# Patient Record
Sex: Female | Born: 1935 | Race: White | Hispanic: No | Smoking: Former smoker
Health system: Southern US, Community
[De-identification: ages and names within clinical notes are randomized; demographics above are authoritative.]

## PROBLEM LIST (undated history)

## (undated) DIAGNOSIS — H469 Unspecified optic neuritis: Secondary | ICD-10-CM

## (undated) DIAGNOSIS — R0602 Shortness of breath: Secondary | ICD-10-CM

## (undated) DIAGNOSIS — D473 Essential (hemorrhagic) thrombocythemia: Secondary | ICD-10-CM

## (undated) DIAGNOSIS — I4891 Unspecified atrial fibrillation: Secondary | ICD-10-CM

## (undated) DIAGNOSIS — K635 Polyp of colon: Secondary | ICD-10-CM

## (undated) DIAGNOSIS — E669 Obesity, unspecified: Secondary | ICD-10-CM

## (undated) DIAGNOSIS — S42302A Unspecified fracture of shaft of humerus, left arm, initial encounter for closed fracture: Secondary | ICD-10-CM

## (undated) DIAGNOSIS — N8501 Benign endometrial hyperplasia: Secondary | ICD-10-CM

## (undated) DIAGNOSIS — I10 Essential (primary) hypertension: Secondary | ICD-10-CM

## (undated) DIAGNOSIS — K5790 Diverticulosis of intestine, part unspecified, without perforation or abscess without bleeding: Secondary | ICD-10-CM

## (undated) DIAGNOSIS — K760 Fatty (change of) liver, not elsewhere classified: Secondary | ICD-10-CM

## (undated) DIAGNOSIS — D75839 Thrombocytosis, unspecified: Secondary | ICD-10-CM

## (undated) DIAGNOSIS — H269 Unspecified cataract: Secondary | ICD-10-CM

## (undated) DIAGNOSIS — K449 Diaphragmatic hernia without obstruction or gangrene: Secondary | ICD-10-CM

## (undated) DIAGNOSIS — N840 Polyp of corpus uteri: Secondary | ICD-10-CM

## (undated) DIAGNOSIS — K219 Gastro-esophageal reflux disease without esophagitis: Secondary | ICD-10-CM

## (undated) HISTORY — PX: OTHER SURGICAL HISTORY: SHX169

## (undated) HISTORY — DX: Gastro-esophageal reflux disease without esophagitis: K21.9

## (undated) HISTORY — DX: Diaphragmatic hernia without obstruction or gangrene: K44.9

## (undated) HISTORY — DX: Unspecified atrial fibrillation: I48.91

## (undated) HISTORY — DX: Benign endometrial hyperplasia: N85.01

## (undated) HISTORY — DX: Fatty (change of) liver, not elsewhere classified: K76.0

## (undated) HISTORY — DX: Diverticulosis of intestine, part unspecified, without perforation or abscess without bleeding: K57.90

## (undated) HISTORY — PX: CATARACT EXTRACTION: SUR2

## (undated) HISTORY — DX: Polyp of colon: K63.5

## (undated) HISTORY — DX: Polyp of corpus uteri: N84.0

## (undated) HISTORY — DX: Obesity, unspecified: E66.9

## (undated) HISTORY — PX: DILATION AND CURETTAGE OF UTERUS: SHX78

## (undated) HISTORY — PX: KNEE SURGERY: SHX244

## (undated) HISTORY — DX: Unspecified optic neuritis: H46.9

## (undated) HISTORY — DX: Shortness of breath: R06.02

## (undated) HISTORY — PX: EYE SURGERY: SHX253

## (undated) HISTORY — DX: Essential (primary) hypertension: I10

## (undated) HISTORY — DX: Unspecified fracture of shaft of humerus, left arm, initial encounter for closed fracture: S42.302A

## (undated) HISTORY — DX: Essential (hemorrhagic) thrombocythemia: D47.3

## (undated) HISTORY — DX: Thrombocytosis, unspecified: D75.839

## (undated) HISTORY — DX: Unspecified cataract: H26.9

## (undated) HISTORY — PX: HERNIA REPAIR: SHX51

---

## 1998-12-11 ENCOUNTER — Other Ambulatory Visit: Admission: RE | Admit: 1998-12-11 | Discharge: 1998-12-11 | Payer: Self-pay | Admitting: Obstetrics and Gynecology

## 2000-03-09 ENCOUNTER — Encounter: Admission: RE | Admit: 2000-03-09 | Discharge: 2000-03-09 | Payer: Self-pay | Admitting: General Surgery

## 2000-03-09 ENCOUNTER — Encounter: Payer: Self-pay | Admitting: Family Medicine

## 2000-07-28 ENCOUNTER — Ambulatory Visit (HOSPITAL_COMMUNITY): Admission: RE | Admit: 2000-07-28 | Discharge: 2000-07-28 | Payer: Self-pay | Admitting: Gastroenterology

## 2000-07-28 ENCOUNTER — Encounter: Payer: Self-pay | Admitting: Gastroenterology

## 2000-07-28 ENCOUNTER — Encounter (INDEPENDENT_AMBULATORY_CARE_PROVIDER_SITE_OTHER): Payer: Self-pay | Admitting: Specialist

## 2000-11-12 ENCOUNTER — Other Ambulatory Visit: Admission: RE | Admit: 2000-11-12 | Discharge: 2000-11-12 | Payer: Self-pay | Admitting: Obstetrics and Gynecology

## 2001-03-29 ENCOUNTER — Encounter: Admission: RE | Admit: 2001-03-29 | Discharge: 2001-03-29 | Payer: Self-pay | Admitting: Family Medicine

## 2001-03-29 ENCOUNTER — Encounter: Payer: Self-pay | Admitting: Family Medicine

## 2001-03-30 ENCOUNTER — Encounter (INDEPENDENT_AMBULATORY_CARE_PROVIDER_SITE_OTHER): Payer: Self-pay | Admitting: Specialist

## 2001-03-30 ENCOUNTER — Other Ambulatory Visit: Admission: RE | Admit: 2001-03-30 | Discharge: 2001-03-30 | Payer: Self-pay | Admitting: General Surgery

## 2001-03-30 ENCOUNTER — Encounter: Admission: RE | Admit: 2001-03-30 | Discharge: 2001-03-30 | Payer: Self-pay | Admitting: Family Medicine

## 2001-03-30 ENCOUNTER — Encounter: Payer: Self-pay | Admitting: Family Medicine

## 2001-11-18 ENCOUNTER — Other Ambulatory Visit: Admission: RE | Admit: 2001-11-18 | Discharge: 2001-11-18 | Payer: Self-pay | Admitting: Obstetrics and Gynecology

## 2002-02-22 ENCOUNTER — Encounter (INDEPENDENT_AMBULATORY_CARE_PROVIDER_SITE_OTHER): Payer: Self-pay | Admitting: *Deleted

## 2002-02-22 ENCOUNTER — Encounter: Admission: RE | Admit: 2002-02-22 | Discharge: 2002-02-22 | Payer: Self-pay | Admitting: Surgery

## 2002-02-22 ENCOUNTER — Encounter: Payer: Self-pay | Admitting: Surgery

## 2002-03-07 HISTORY — PX: CHOLECYSTECTOMY: SHX55

## 2002-03-20 ENCOUNTER — Encounter: Payer: Self-pay | Admitting: Surgery

## 2002-03-27 ENCOUNTER — Encounter: Payer: Self-pay | Admitting: Surgery

## 2002-03-27 ENCOUNTER — Observation Stay (HOSPITAL_COMMUNITY): Admission: RE | Admit: 2002-03-27 | Discharge: 2002-03-28 | Payer: Self-pay | Admitting: Surgery

## 2002-03-27 ENCOUNTER — Encounter (INDEPENDENT_AMBULATORY_CARE_PROVIDER_SITE_OTHER): Payer: Self-pay | Admitting: *Deleted

## 2002-03-27 ENCOUNTER — Encounter (INDEPENDENT_AMBULATORY_CARE_PROVIDER_SITE_OTHER): Payer: Self-pay

## 2002-04-20 ENCOUNTER — Encounter: Payer: Self-pay | Admitting: General Surgery

## 2002-04-20 ENCOUNTER — Encounter: Admission: RE | Admit: 2002-04-20 | Discharge: 2002-04-20 | Payer: Self-pay | Admitting: General Surgery

## 2002-09-06 HISTORY — PX: OTHER SURGICAL HISTORY: SHX169

## 2002-10-05 ENCOUNTER — Encounter (INDEPENDENT_AMBULATORY_CARE_PROVIDER_SITE_OTHER): Payer: Self-pay | Admitting: *Deleted

## 2002-10-05 ENCOUNTER — Observation Stay (HOSPITAL_COMMUNITY): Admission: RE | Admit: 2002-10-05 | Discharge: 2002-10-06 | Payer: Self-pay | Admitting: Surgery

## 2002-12-13 ENCOUNTER — Encounter (INDEPENDENT_AMBULATORY_CARE_PROVIDER_SITE_OTHER): Payer: Self-pay | Admitting: *Deleted

## 2002-12-13 ENCOUNTER — Encounter: Payer: Self-pay | Admitting: Family Medicine

## 2002-12-13 ENCOUNTER — Ambulatory Visit (HOSPITAL_COMMUNITY): Admission: RE | Admit: 2002-12-13 | Discharge: 2002-12-13 | Payer: Self-pay | Admitting: Family Medicine

## 2002-12-26 ENCOUNTER — Other Ambulatory Visit: Admission: RE | Admit: 2002-12-26 | Discharge: 2002-12-26 | Payer: Self-pay | Admitting: Gynecology

## 2003-04-27 ENCOUNTER — Encounter: Admission: RE | Admit: 2003-04-27 | Discharge: 2003-04-27 | Payer: Self-pay | Admitting: Gynecology

## 2003-04-27 ENCOUNTER — Encounter: Payer: Self-pay | Admitting: Gynecology

## 2004-02-19 ENCOUNTER — Other Ambulatory Visit: Admission: RE | Admit: 2004-02-19 | Discharge: 2004-02-19 | Payer: Self-pay | Admitting: Gynecology

## 2004-05-13 ENCOUNTER — Encounter: Admission: RE | Admit: 2004-05-13 | Discharge: 2004-05-13 | Payer: Self-pay | Admitting: Gynecology

## 2004-07-22 ENCOUNTER — Ambulatory Visit (HOSPITAL_COMMUNITY): Admission: RE | Admit: 2004-07-22 | Discharge: 2004-07-22 | Payer: Self-pay | Admitting: Gynecology

## 2004-07-22 ENCOUNTER — Ambulatory Visit (HOSPITAL_BASED_OUTPATIENT_CLINIC_OR_DEPARTMENT_OTHER): Admission: RE | Admit: 2004-07-22 | Discharge: 2004-07-22 | Payer: Self-pay | Admitting: Gynecology

## 2004-07-22 ENCOUNTER — Encounter (INDEPENDENT_AMBULATORY_CARE_PROVIDER_SITE_OTHER): Payer: Self-pay | Admitting: Specialist

## 2005-05-26 ENCOUNTER — Encounter: Admission: RE | Admit: 2005-05-26 | Discharge: 2005-05-26 | Payer: Self-pay | Admitting: Gynecology

## 2005-06-02 ENCOUNTER — Encounter: Admission: RE | Admit: 2005-06-02 | Discharge: 2005-06-02 | Payer: Self-pay | Admitting: Gynecology

## 2005-06-24 ENCOUNTER — Other Ambulatory Visit: Admission: RE | Admit: 2005-06-24 | Discharge: 2005-06-24 | Payer: Self-pay | Admitting: Gynecology

## 2006-06-29 ENCOUNTER — Encounter: Admission: RE | Admit: 2006-06-29 | Discharge: 2006-06-29 | Payer: Self-pay | Admitting: Family Medicine

## 2006-09-01 ENCOUNTER — Other Ambulatory Visit: Admission: RE | Admit: 2006-09-01 | Discharge: 2006-09-01 | Payer: Self-pay | Admitting: Gynecology

## 2007-01-19 ENCOUNTER — Ambulatory Visit: Payer: Self-pay | Admitting: Cardiology

## 2007-07-26 ENCOUNTER — Encounter: Admission: RE | Admit: 2007-07-26 | Discharge: 2007-07-26 | Payer: Self-pay | Admitting: Gynecology

## 2007-08-02 ENCOUNTER — Ambulatory Visit: Payer: Self-pay

## 2007-08-02 ENCOUNTER — Encounter: Payer: Self-pay | Admitting: Family Medicine

## 2007-09-21 ENCOUNTER — Ambulatory Visit: Payer: Self-pay | Admitting: Cardiology

## 2007-10-13 ENCOUNTER — Other Ambulatory Visit: Admission: RE | Admit: 2007-10-13 | Discharge: 2007-10-13 | Payer: Self-pay | Admitting: Gynecology

## 2007-10-18 ENCOUNTER — Encounter: Admission: RE | Admit: 2007-10-18 | Discharge: 2007-10-18 | Payer: Self-pay | Admitting: Gynecology

## 2007-11-08 ENCOUNTER — Ambulatory Visit: Payer: Self-pay | Admitting: Gastroenterology

## 2007-11-09 ENCOUNTER — Ambulatory Visit: Payer: Self-pay | Admitting: Gastroenterology

## 2007-11-09 ENCOUNTER — Encounter: Payer: Self-pay | Admitting: Gastroenterology

## 2007-12-07 DIAGNOSIS — R0602 Shortness of breath: Secondary | ICD-10-CM

## 2007-12-07 DIAGNOSIS — N8501 Benign endometrial hyperplasia: Secondary | ICD-10-CM | POA: Insufficient documentation

## 2007-12-07 DIAGNOSIS — K449 Diaphragmatic hernia without obstruction or gangrene: Secondary | ICD-10-CM | POA: Insufficient documentation

## 2007-12-07 DIAGNOSIS — D179 Benign lipomatous neoplasm, unspecified: Secondary | ICD-10-CM | POA: Insufficient documentation

## 2007-12-07 DIAGNOSIS — D126 Benign neoplasm of colon, unspecified: Secondary | ICD-10-CM | POA: Insufficient documentation

## 2007-12-07 DIAGNOSIS — E669 Obesity, unspecified: Secondary | ICD-10-CM | POA: Insufficient documentation

## 2007-12-07 DIAGNOSIS — K5730 Diverticulosis of large intestine without perforation or abscess without bleeding: Secondary | ICD-10-CM | POA: Insufficient documentation

## 2007-12-07 DIAGNOSIS — K7689 Other specified diseases of liver: Secondary | ICD-10-CM

## 2007-12-07 DIAGNOSIS — K219 Gastro-esophageal reflux disease without esophagitis: Secondary | ICD-10-CM

## 2007-12-07 DIAGNOSIS — N840 Polyp of corpus uteri: Secondary | ICD-10-CM | POA: Insufficient documentation

## 2007-12-07 DIAGNOSIS — I10 Essential (primary) hypertension: Secondary | ICD-10-CM

## 2008-10-23 ENCOUNTER — Encounter: Admission: RE | Admit: 2008-10-23 | Discharge: 2008-10-23 | Payer: Self-pay | Admitting: Gynecology

## 2009-12-24 ENCOUNTER — Encounter: Admission: RE | Admit: 2009-12-24 | Discharge: 2009-12-24 | Payer: Self-pay | Admitting: Gynecology

## 2010-02-10 ENCOUNTER — Encounter: Admission: RE | Admit: 2010-02-10 | Discharge: 2010-02-10 | Payer: Self-pay | Admitting: Family Medicine

## 2010-02-26 ENCOUNTER — Ambulatory Visit: Payer: Self-pay | Admitting: Cardiology

## 2010-02-26 DIAGNOSIS — R002 Palpitations: Secondary | ICD-10-CM | POA: Insufficient documentation

## 2010-03-19 ENCOUNTER — Ambulatory Visit: Payer: Self-pay | Admitting: Cardiovascular Disease

## 2010-03-19 ENCOUNTER — Ambulatory Visit (HOSPITAL_COMMUNITY): Admission: RE | Admit: 2010-03-19 | Discharge: 2010-03-19 | Payer: Self-pay | Admitting: Cardiology

## 2010-03-19 ENCOUNTER — Ambulatory Visit: Payer: Self-pay | Admitting: Cardiology

## 2010-03-19 ENCOUNTER — Encounter: Payer: Self-pay | Admitting: Cardiology

## 2010-03-20 LAB — CONVERTED CEMR LAB
CO2: 31 meq/L (ref 19–32)
Calcium: 9.4 mg/dL (ref 8.4–10.5)
Chloride: 105 meq/L (ref 96–112)
Creatinine, Ser: 0.9 mg/dL (ref 0.4–1.2)
GFR calc non Af Amer: 64.12 mL/min (ref >60)
Glucose, Bld: 143 mg/dL — ABNORMAL HIGH (ref 70–99)
Potassium: 3.7 meq/L (ref 3.5–5.1)
Pro B Natriuretic peptide (BNP): 145 pg/mL — ABNORMAL HIGH (ref 0.0–100.0)

## 2010-04-21 ENCOUNTER — Encounter: Payer: Self-pay | Admitting: Cardiology

## 2010-04-23 ENCOUNTER — Ambulatory Visit: Payer: Self-pay | Admitting: Cardiology

## 2010-10-15 ENCOUNTER — Ambulatory Visit: Payer: Self-pay | Admitting: Cardiology

## 2010-12-28 ENCOUNTER — Encounter: Payer: Self-pay | Admitting: Gastroenterology

## 2010-12-28 ENCOUNTER — Encounter: Payer: Self-pay | Admitting: Gynecology

## 2011-01-08 NOTE — Assessment & Plan Note (Signed)
Summary: [facility]   Visit Type:  Follow-up Primary Provider:  Dr. Christell Constant, MD  CC:  HTN and Dyspnea.  History of Present Illness: The patient presents for followup. At the last visit she was having some dyspnea and higher blood pressures so I decided 12.5 mg daily. Her blood pressures been in the 120s over 80s. Her dyspnea is slightly improved. I did get an echocardiogram which demonstrated well-preserved systolic function but evidence of diastolic dysfunction. BNP was very mildly elevated. She was a little lightheaded in starting the medicine but has tolerated it and now has no problems. She is not describing any chest pressure, neck or arm discomfort. She's not having any palpitations,  pre-syncope or syncope. She has no PND or orthopnea. I did check a basic metabolic profile and her potassium and creatinine were fine.  Current Medications (verified): 1)  Prometrium 200 Mg Caps (Progesterone Micronized) .Marland Kitchen.. 12 Q 4 Months 2)  Zoloft 25 Mg Tabs (Sertraline Hcl) .Marland Kitchen.. 1 By Mouth Daily 3)  Prilosec 20 Mg Cpdr (Omeprazole) .Marland Kitchen.. 1 By Mouth Daily 4)  Trimethoprim 100 Mg Tabs (Trimethoprim) .... As Needed 5)  Aspirin 325 Mg  Tabs (Aspirin) .Marland Kitchen.. 1 By Mouth Daily 6)  Vitamin D 1000 Unit  Tabs (Cholecalciferol) .Marland Kitchen.. 1 By Mouth Dfaily 7)  Calcium Carbonate   Powd (Calcium Carbonate) .Marland Kitchen.. 1 By Mouth Daily 8)  Multivitamins   Tabs (Multiple Vitamin) .Marland Kitchen.. 1 By Mouth Daily 9)  Hydrochlorothiazide 25 Mg Tabs (Hydrochlorothiazide) .... 1/2 Tablet Daily  Allergies (verified): No Known Drug Allergies  Past History:  Past Medical History: Reviewed history from 02/26/2010 and no changes required. 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 Hysterectomy Cholecystectomy 03-2002 Umbilical Hernia Repair 09-2002 Orthopedic Knee Surgery Optic neuritis  Review of Systems       As stated in the HPI and negative for all other systems.   Vital Signs:  Patient profile:   77 year old female Height:      62 inches Weight:      185 pounds BMI:     33.96 Pulse rate:   79 / minute Resp:     16 per minute BP sitting:   144 / 74  (right arm)  Vitals Entered By: Marrion Coy, CNA (Apr 23, 2010 10:23 AM)  Physical Exam  General:  Well developed, well nourished, in no acute distress. Head:  normocephalic and atraumatic Neck:  Neck supple, no JVD. No masses, thyromegaly or abnormal cervical nodes. Lungs:  Clear bilaterally to auscultation and percussion. Heart:  Non-displaced PMI, chest non-tender; regular rate and rhythm, S1, S2 without murmurs, rubs or gallops. Carotid upstroke normal, no bruit. Normal abdominal aortic size, no bruits. Femorals normal pulses, no bruits. Pedals normal pulses. No edema, no varicosities. Abdomen:  Bowel sounds positive; abdomen soft and non-tender without masses, organomegaly, or hernias noted. No hepatosplenomegaly. Msk:  Back normal, normal gait. Muscle strength and tone normal. Extremities:  No clubbing or cyanosis. Neurologic:  Alert and oriented x 3. Skin:  Intact without lesions or rashes. Psych:  Normal affect.   Impression & Recommendations:  Problem # 1:  HYPERTENSION (ICD-401.9) Ther blood pressure is slightly high today it  is well controlled otherwise and better than previous. She will continue on the current meds.  Problem # 2:  SOB (ICD-786.05) Her dyspnea is well controlled. She knows to avoid salt and excess  fluid.  Other Orders: EKG w/ Interpretation (93000)  Patient Instructions: 1)  Your physician recommends that you schedule a follow-up appointment in: 6 months with Dr Antoine Poche 2)  Your physician recommends that you continue on your current medications as directed. Please refer to the Current Medication list given to you today. Prescriptions: HYDROCHLOROTHIAZIDE 12.5 MG CAPS (HYDROCHLOROTHIAZIDE) one daily  #30 x 11   Entered by:   Charolotte Capuchin, RN   Authorized by:   Rollene Rotunda, MD, [facility]   Signed by:   Charolotte Capuchin, RN on 04/23/2010   Method used:   Electronically to        [facility] [phone]* (retail)       [address] [place], [place]  [address]       Ph: [phone] or [phone]       Fax: [phone]   RxID:   [id number]

## 2011-01-08 NOTE — Assessment & Plan Note (Signed)
Summary: [facility]   Visit Type:  Follow-up Primary Provider:  Dr. Christell Constant, MD  CC:  Dyspnea.  History of Present Illness: The patient presents for followup. It has been over 2-1/2 years since I last saw her. At that time she was having palpitations. However, these are no longer problematic. She does not feel syncope or syncope. She does not have chest pressure, neck or arm discomfort. However, she has been getting increasingly short of breath. She denies any resting shortness of breath and doesn't describe PND or orthopnea. However, she describes dyspnea with exertion such as walking a flight of stairs. He takes for several minutes to recover. This has been slowly progressive. She doesn't describe weight gain or swelling other than some mild edema after she's been on her feet. She does bring a blood pressure diary and to review this. Her blood pressures have been creeping up often over 140 systolic.  Current Medications (verified): 1)  Prometrium 200 Mg Caps (Progesterone Micronized) .Marland Kitchen.. 1 Q 4 Months 2)  Zoloft 25 Mg Tabs (Sertraline Hcl) .Marland Kitchen.. 1 By Mouth Daily 3)  Prilosec 20 Mg Cpdr (Omeprazole) .Marland Kitchen.. 1 By Mouth Daily 4)  Trimethoprim 100 Mg Tabs (Trimethoprim) .... As Needed 5)  Aspirin 325 Mg  Tabs (Aspirin) .Marland Kitchen.. 1 By Mouth Daily 6)  Vitamin D 1000 Unit  Tabs (Cholecalciferol) .Marland Kitchen.. 1 By Mouth Dfaily 7)  Calcium Carbonate   Powd (Calcium Carbonate) .Marland Kitchen.. 1 By Mouth Daily 8)  Multivitamins   Tabs (Multiple Vitamin) .Marland Kitchen.. 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 Hysterectomy Cholecystectomy 03-2002 Umbilical Hernia Repair 09-2002 Orthopedic Knee Surgery Optic neuritis  Review of Systems       As stated in the HPI and negative for all other systems.   Vital Signs:  Patient profile:   77 year old female Height:      62 inches Weight:      181 pounds BMI:     33.22 Pulse rate:   82 / minute Resp:     16 per minute BP sitting:   158 / 80  (right arm)  Vitals Entered By: Marrion Coy, CNA (February 26, 2010 11:22 AM)  Physical Exam  General:  Well developed, well nourished, in no acute distress. Head:  normocephalic and atraumatic Eyes:  PERRLA/EOM intact; conjunctiva and lids normal. Mouth:  Teeth, gums and palate normal. Oral mucosa normal. Neck:  Neck supple, no JVD. No masses, thyromegaly or abnormal cervical nodes. Chest Wall:  no deformities or breast masses noted Lungs:  Clear bilaterally to auscultation and percussion. Abdomen:  Bowel sounds positive; abdomen soft and non-tender without masses, organomegaly, or hernias noted. No hepatosplenomegaly. Msk:  Back normal, normal gait. Muscle strength and tone normal. Extremities:  No clubbing or cyanosis. Neurologic:  Alert and oriented x 3. Skin:  Intact without lesions or rashes. Cervical Nodes:  no significant adenopathy Axillary Nodes:  no significant adenopathy Inguinal Nodes:  no significant adenopathy Psych:  Normal affect.   Detailed Cardiovascular Exam  Neck    Carotids: Carotids full and equal bilaterally without bruits.  Neck Veins: Normal, no JVD.    Heart    Inspection: no deformities or lifts noted.      Palpation: normal PMI with no thrills palpable.      Auscultation: regular rate and rhythm, S1, S2 without murmurs, rubs, gallops, or clicks.    Vascular     Abdominal Aorta: no palpable masses, pulsations, or audible bruits.      Femoral Pulses: normal femoral pulses bilaterally.      Pedal Pulses: normal pedal pulses bilaterally.      Radial Pulses: normal radial pulses bilaterally.      Peripheral Circulation: no clubbing, cyanosis, or edema noted with normal capillary refill.     EKG  Procedure date:  02/26/2010  Findings:      sinus rhythm, left axis deviation, left anterior fascicular block, poor anterior R-wave progression, no acute ST-T wave changes.  Impression & Recommendations:  Problem # 1:  SOB (ICD-786.05) Her biggest complaint is dyspnea on exertion. This may be multifactorial. I'm going to check an echocardiogram to look for systolic or diastolic dysfunction. I suspect the latter. Further evaluation will be based on this. I will also check a BNP level.  Problem # 2:  HYPERTENSION (ICD-401.9) Her blood pressure is elevated and has been creeping up. I will have hydrochlorothiazide 12.5 mg daily. In 2 weeks she will get a basic metabolic profile.  Problem # 3:  PALPITATIONS (ICD-785.1) These are not problematic and no further testing is indicated.

## 2011-01-08 NOTE — Miscellaneous (Signed)
Summary: Orders Update  Clinical Lists Changes  Medications: Added new medication of HYDROCHLOROTHIAZIDE 25 MG TABS (HYDROCHLOROTHIAZIDE) 1/2 TABLET DAILY - Signed Rx of HYDROCHLOROTHIAZIDE 25 MG TABS (HYDROCHLOROTHIAZIDE) 1/2 TABLET DAILY;  #30 x 6;  Signed;  Entered by: Charolotte Capuchin, RN;  Authorized by: Rollene Rotunda, MD, [facility];  Method used: Electronically to [facility] [phone]*, [address] [place], [place], [place]  [address], Ph: [phone] or [phone], Fax: [phone] Orders: Added new Service order of EKG w/ Interpretation (93000) - Signed Added new Referral order of Echocardiogram (Echo) - Signed Observations: Added new observation of PI CARDIO: Your physician recommends that you schedule a follow-up appointment  04/23/2010 AT 10:30 AM IN [place] Your physician recommends that you return for a BMP AND BNP THE SAME DAY   786.05 Your physician has recommended you make the following change in your medication: START HYDROCHLORATHIAZIDE 12.5 MG A DAY Your physician has requested that you have an echocardiogram.  Echocardiography is a painless test that uses sound waves to create images of your heart. It provides your doctor with information about the size and shape of your heart and how well your heart's chambers and valves are working.  This procedure takes approximately one hour. There are no restrictions for this procedure. (02/26/2010 12:10)    Prescriptions: HYDROCHLOROTHIAZIDE 25 MG TABS (HYDROCHLOROTHIAZIDE) 1/2 TABLET DAILY  #30 x 6   Entered by:   Charolotte Capuchin, RN   Authorized by:   Rollene Rotunda, MD, [facility]   Signed by:   Charolotte Capuchin, RN on 02/26/2010   Method used:   Electronically to        [facility] [phone]* (retail)       [address] [place], [place]  [address]       Ph: [phone] or [phone]       Fax: [phone]   RxID:   [id number]    Patient Instructions: 1)  Your  physician recommends that you schedule a follow-up appointment  04/23/2010 AT 10:30 AM IN [place] 2)  Your physician recommends that you return for a BMP AND BNP THE SAME DAY   786.05 3)  Your physician has recommended you make the following change in your medication: START HYDROCHLORATHIAZIDE 12.5 MG A DAY 4)  Your physician has requested that you have an echocardiogram.  Echocardiography is a painless test that uses sound waves to create images of your heart. It provides your doctor with information about the size and shape of your heart and how well your heart's chambers and valves are working.  This procedure takes approximately one hour. There are no restrictions for this procedure.

## 2011-01-08 NOTE — Miscellaneous (Signed)
  Clinical Lists Changes  Observations: Added new observation of ECHOINTERP:  - Left ventricle: The cavity size was normal. Wall thickness was       normal. Systolic function was normal. The estimated ejection       fraction was in the range of 55% to 60%. Wall motion was normal;       there were no regional wall motion abnormalities. Features are       consistent with a pseudonormal left ventricular filling pattern,       with concomitant abnormal relaxation and increased filling       pressure (grade 2 diastolic dysfunction).     - Atrial septum: Not well seen. May be aneurysmal with no ASD Not       clinically significant     - Pulmonary arteries: PA peak pressure: 38mm Hg (S). (03/19/2010 9:20)      Echocardiogram  Procedure date:  03/19/2010  Findings:       - Left ventricle: The cavity size was normal. Wall thickness was       normal. Systolic function was normal. The estimated ejection       fraction was in the range of 55% to 60%. Wall motion was normal;       there were no regional wall motion abnormalities. Features are       consistent with a pseudonormal left ventricular filling pattern,       with concomitant abnormal relaxation and increased filling       pressure (grade 2 diastolic dysfunction).     - Atrial septum: Not well seen. May be aneurysmal with no ASD Not       clinically significant     - Pulmonary arteries: PA peak pressure: 38mm Hg (S).

## 2011-01-08 NOTE — Assessment & Plan Note (Signed)
Summary: [facility]   Visit Type:  Follow-up Primary Provider:  Dr. Christell Constant, MD  CC:  dyspnea.  History of Present Illness: The patient presents for followup of the above. Since I last saw her dyspnea has been much improved. She will still get short of breath climbing a flight of stairs but this is mild and she recovers quickly. She does not describe chest pressure, neck or arm discomfort. She is not noticing any palpitations, presyncope or syncope. She reports that her blood pressure has been well-controlled.  Current Medications (verified): 1)  Prometrium 200 Mg Caps (Progesterone Micronized) .Marland Kitchen.. 12 Q 4 Months 2)  Zoloft 25 Mg Tabs (Sertraline Hcl) .Marland Kitchen.. 1 By Mouth Daily 3)  Prilosec 20 Mg Cpdr (Omeprazole) .Marland Kitchen.. 1 By Mouth Daily 4)  Trimethoprim 100 Mg Tabs (Trimethoprim) .... As Needed 5)  Aspirin 325 Mg  Tabs (Aspirin) .Marland Kitchen.. 1 By Mouth Daily 6)  Vitamin D 1000 Unit  Tabs (Cholecalciferol) .Marland Kitchen.. 1 By Mouth Dfaily 7)  Calcium Carbonate   Powd (Calcium Carbonate) .Marland Kitchen.. 1 By Mouth Daily 8)  Multivitamins   Tabs (Multiple Vitamin) .Marland Kitchen.. 1 By Mouth Daily 9)  Hydrochlorothiazide 12.5 Mg Caps (Hydrochlorothiazide) .... One Daily 10)  Estradiol 0.025 Mg/24hr Ptwk (Estradiol) .... As Directed  Allergies (verified): No Known Drug Allergies  Past History:  Past Medical History: Reviewed history from 02/26/2010 and no changes required. 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 Hysterectomy Cholecystectomy 03-2002 Umbilical Hernia Repair 09-2002 Orthopedic Knee Surgery Optic neuritis  Review of Systems       As stated in the HPI and negative for all other systems.   Vital Signs:  Patient profile:   77 year old female Height:      62 inches Weight:      182 pounds BMI:     33.41 Pulse rate:   76 / minute Resp:     18 per minute BP sitting:   120 / 70  (right arm)  Vitals Entered By: Marrion Coy, CNA (October 15, 2010 2:08 PM)  Physical Exam  General:  Well developed, well nourished, in no acute distress. Head:  normocephalic and atraumatic Neck:  Neck supple, no JVD. No masses, thyromegaly or abnormal cervical nodes. Chest Wall:  no deformities or breast masses noted Lungs:  Clear bilaterally to auscultation and percussion. Heart:  Non-displaced PMI, chest non-tender; regular rate and rhythm, S1, S2 without murmurs, rubs or gallops. Carotid upstroke normal, no bruit. Normal abdominal aortic size, no bruits. Femorals normal pulses, no bruits. Pedals normal pulses. No edema, no varicosities. Abdomen:  Bowel sounds positive; abdomen soft and non-tender without masses, organomegaly, or hernias noted. No hepatosplenomegaly. Msk:  Back normal, normal gait. Muscle strength and tone normal. Pulses:  normal pedal pulses bilaterally.   Extremities:  No clubbing or cyanosis. Neurologic:  Alert and oriented x 3. Skin:  Intact without lesions or rashes. Cervical Nodes:  no significant adenopathy Inguinal Nodes:  no significant adenopathy Psych:  Normal affect.   EKG  Procedure date:  10/15/2010  Findings:      Normal sinus rhythm, left axis deviation, left anterior fascicular block, poor anterior R-wave progression, no acute ST-T wave  changes.  Impression & Recommendations:  Problem # 1:  SOB (ICD-786.05) Her dyspnea is much improved.  At this point no further work up is needed and she will continue on meds as listed.  Problem # 2:   HYPERTENSION (ICD-401.9) Her blood pressure is well controlled on meds as listed.  Problem # 3:  PALPITATIONS (ICD-785.1) She is having no further palpitations. No change in therapy is indicated. Orders: EKG w/ Interpretation (93000)  Patient Instructions: 1)  Your physician recommends that you schedule a follow-up appointment in: 1 yr with Dr Antoine Poche 2)  Your physician recommends that you continue on your current medications as directed. Please refer to the Current Medication list given to you today.

## 2011-01-26 ENCOUNTER — Other Ambulatory Visit: Payer: Self-pay | Admitting: Family Medicine

## 2011-01-26 ENCOUNTER — Encounter: Payer: Self-pay | Admitting: Internal Medicine

## 2011-01-26 DIAGNOSIS — R1031 Right lower quadrant pain: Secondary | ICD-10-CM

## 2011-01-28 ENCOUNTER — Encounter (INDEPENDENT_AMBULATORY_CARE_PROVIDER_SITE_OTHER): Payer: Self-pay | Admitting: *Deleted

## 2011-01-28 ENCOUNTER — Ambulatory Visit (HOSPITAL_COMMUNITY)
Admission: RE | Admit: 2011-01-28 | Discharge: 2011-01-28 | Disposition: A | Discharge status: Home / Self Care | Payer: Medicare Other | Source: Ambulatory Visit | Attending: Family Medicine | Admitting: Family Medicine

## 2011-01-28 DIAGNOSIS — R1031 Right lower quadrant pain: Secondary | ICD-10-CM | POA: Insufficient documentation

## 2011-01-28 DIAGNOSIS — N200 Calculus of kidney: Secondary | ICD-10-CM | POA: Insufficient documentation

## 2011-01-28 DIAGNOSIS — K4020 Bilateral inguinal hernia, without obstruction or gangrene, not specified as recurrent: Secondary | ICD-10-CM | POA: Insufficient documentation

## 2011-01-28 DIAGNOSIS — K449 Diaphragmatic hernia without obstruction or gangrene: Secondary | ICD-10-CM | POA: Insufficient documentation

## 2011-01-28 DIAGNOSIS — K5730 Diverticulosis of large intestine without perforation or abscess without bleeding: Secondary | ICD-10-CM | POA: Insufficient documentation

## 2011-02-02 ENCOUNTER — Ambulatory Visit (INDEPENDENT_AMBULATORY_CARE_PROVIDER_SITE_OTHER): Payer: Medicare Other | Admitting: Internal Medicine

## 2011-02-02 ENCOUNTER — Encounter: Payer: Self-pay | Admitting: Internal Medicine

## 2011-02-02 DIAGNOSIS — R109 Unspecified abdominal pain: Secondary | ICD-10-CM | POA: Insufficient documentation

## 2011-02-02 DIAGNOSIS — R1032 Left lower quadrant pain: Secondary | ICD-10-CM

## 2011-02-02 DIAGNOSIS — R933 Abnormal findings on diagnostic imaging of other parts of digestive tract: Secondary | ICD-10-CM

## 2011-02-03 NOTE — Letter (Signed)
Summary: CT ABD/PELVIS  CT ABD/PELVIS   Imported By: Lamona Curl CMA (AAMA) 01/29/2011 14:17:34  _____________________________________________________________________  External Attachment:    Type:   Image     Comment:   External Document

## 2011-02-03 NOTE — Procedures (Signed)
Summary: EGD   EGD  Procedure date:  07/28/2000  Findings:      Location: [facility]                          [facility]  Patient:    Kristy [name], Kristy [name]                         MRN: [id number] Proc. Date: 07/28/00 Adm. Date:  [id number] Attending:  Mardella Layman                           Procedure Report  PROCEDURE:  Endoscopy.  INDICATIONS FOR PROCEDURE:  Kristy [name] is a 77 year old white female who has had rather classic regurgitation and nocturnal acid reflux for many years. She has been on Prilosec for the last year with good improvement in her symptomatology. It is felt that endoscopy was indicated to clear underlying Barretts mucosa. The risks and benefits of this procedure were explained in detail, she agreed to proceed as planned. Preoperative cardiopulmonary and mental status exams were unremarkable.  ENDOSCOPY REPORT:  Throughout this procedure, the patient was on pulse oximetry and cardiac monitorization. She received supplemental low flow oxygen administration. She was intubated easily with the Olympus adult video endoscope. The hypopharynx and vocal cords appeared normal. The esophagus was generally unremarkable although the esophagus was shortened because of a large 8-10 cm sliding hiatal hernia. The endoscope was navigated through this large hernia into the stomach. Mucosal pattern of the fundus, body, antrum, and pylorus of the stomach was unremarkable as was the duodenal bulb and duodenal sweep. Retroflexed view of the fundus and cardia of the stomach showed this above mentioned large hiatal hernia. There were some linear Cameron erosions in the hernia sac. Again picture obtained for documentation. She was then extubated without difficulty, and tolerated this procedure well.  ASSESSMENT:  This patient has a large hiatal hernia with erosions in the hernia sac. She is high risk for severe acid reflux symptoms, and needs to be on chronic high  dose acid suppressive therapy. I would not recommend surgery at this time unless she fails medical management.  RECOMMENDATIONS:  1. Strict antireflux regime and maneuvers.  2. Increase her H2 blocker to Nexium 40 mg a day.  3. Office follow-up in 1 months time. DD:  07/28/00 TD:  07/29/00 Job: [id number]

## 2011-02-03 NOTE — Procedures (Signed)
Summary: COLON w/Bx   Colonoscopy  Procedure date:  07/28/2000  Findings:      Location:  [facility]                          [facility]  Patient:    Kristy [name], Kristy [name]                         MRN: [id number] Proc. Date: 07/28/00 Adm. Date:  [id number] Attending:  Mardella Layman CC:         Rudi Heap, M.D., [place], [place].   Procedure Report  PROCEDURE:  Colonoscopy.  INDICATIONS FOR PROCEDURE:  Ms. Fretz is a 77 year old white female who has a recurrent crampy abdominal pain, gas and bloating. She is scheduled for colonoscopy for surveillance purposes. The risks and benefits of the procedure were explained in detail, she agreed to proceed as planned. Preoperative cardiopulmonary and mental status exams were unremarkable.  COLONOSCOPY PROCEDURE:  Throughout this procedure the patient was on pulse oximeter and cardiac monitorization. She tolerated this procedure well receiving supplemental low flow oxygen by nasal cannula throughout the procedure. She was anesthetized with 75 mcg of IV fentanyl, 5 mg of IV Versed. Inspection of her rectum was unremarkable as was a rectal exam. Her rectum was intubated using the Olympus video colonoscope. This advanced easily and rapidly throughout the colon and cecum. The prep throughout the colon was excellent. The cecum was generally unremarkable. There was a small 2 cm lipoma in the proximal ascending colon. Picture obtained for documentation. The colonoscope was then withdrawn throughout the length of the colon. There were numerous diverticula throughout the sigmoid colon. At the junction of the sigmoid colon and descending colon, there was a 5 mm flat adenomatous appearing polyp. This was removed with electrocautery hot biopsy forceps, the tissue was sent to pathology for exam. There appeared to be bleeding from the polypectomy site, and it was injected with 1 cc of 1:10,000 epinephrine. There appeared to be good  coagulation. Retroflexed view of the rectum showed some bulging but nonbleeding internal hemorrhoids. She was then extubated without difficulty, and tolerated this procedure well.  ASSESSMENT:  1. Small right colon lipoma.  2. Left colon diverticulosis.  3. Small sigmoid colon polyp excised--rule out adenoma.  4. Nonbleeding internal hemorrhoids.     RECOMMENDATIONS:  1. Strict post polypectomy orders and precautions.  2. Follow-up on pathology with results.  3. Office follow-up in 1 months time. DD:  07/28/00 TD:  07/29/00 Job: [id number]  This report was created from the original endoscopy report, which was reviewed and signed by the above listed endoscopist.     FINAL DIAGNOSIS    ***MICROSCOPIC EXAMINATION AND DIAGNOSIS***    COLON, SIGMOID POLYP: HYPERPLASTIC POLYP. NO ADENOMATOUS CHANGE   OR MALIGNANCY IDENTIFIED.    ab   Date Reported: 07/29/00 Beulah Gandy. Luisa Hart, MD   *** Electronically Signed Out By JDP ***    Clinical information   R/O neoplasia. (tmc)    specimen(s) obtained   Colon, polyp, sigmoid    Gross Description   Received in formalin is a tan, soft tissue fragment that is   submitted in toto. Size: 0.3 cm (SHP:smr 8/22)    smr/

## 2011-02-03 NOTE — Op Note (Signed)
Summary: Open Hernia Repair with Mesh    NAME:  ARLYCE, CIRCLE                            ACCOUNT NO.:  [id number]   MEDICAL RECORD NO.:  [id number]                   PATIENT TYPE:  AMB   LOCATION:  DAY                                  FACILITY:  [facility]   PHYSICIAN:  Sandria Bales. Ezzard Standing, MD                 DATE OF BIRTH:  23-Mar-1928   DATE OF PROCEDURE:  DATE OF DISCHARGE:                                 OPERATIVE REPORT   CCS#:  [id number]   PREOPERATIVE DIAGNOSIS:  Inferior ventral incisional hernia.   POSTOPERATIVE DIAGNOSIS:  Infraumbilical ventral incisional hernia.   PROCEDURE:  Open hernia repair with mesh.   SURGEON:  Kristine Garbe. Ezzard Standing, M.D.   FIRST ASSISTANT:  None.   ANESTHESIA:  General endotracheal.   ESTIMATED BLOOD LOSS:  Minimal.   INDICATIONS FOR PROCEDURE:  Ms. Berrocal is a 77 year old white female who had  a laparoscopic cholecystectomy in April of 2003. At that time, she was noted  to have an infraumbilical hernia which I repaired at the time of the  laparoscopic hernia. However, she has noticed over the last 6-8 weeks an  increasing bulge in the lower umbilicus which is consistent with an  incisional hernia in this area. The diameter of this mass measures about 7  or 8 cm. The discussion throughout with the patient of the indications and  potential complications of repairing this hernia. The potential  complications included but not limited to infection, bleeding, and  recurrence of the hernia.   The patient now comes to the operating room for repair of this  infraumbilical incisional hernia.   DESCRIPTION OF PROCEDURE:  The patient was in supine position, given 1 gm of  Ancef at the initiation of the procedure. She had a general endotracheal  anesthetic. Her abdomen was prepped with Betadine solution and sterilely  draped. I made a linear incision immediately below the umbilicus and cut  down to the abdominal fascia. She had a fairly large hernia sac which  I  dissected out of the subcutaneous tissues down to an abdominal wall defect  which measured about 5 1/2 cm in diameter. I excised the sac, there was no  evidence of incarceration in the sac. I pulled the omentum down underneath  the hernia and then carried out a repair of the hernia.   I closed the hernia transversely with interrupted #1 Novofil sutures and  again this measured about 5 1/2 cm upon closure. I then laid over the top of  this a piece of atrium mesh trying to go about 2-3 cm around the hernia in  all directions. So I laid on an approximate 3 1/2 to 4 cm x 12 cm piece of  atrium mesh on top of this and sewed it in place using #0 Novofil sutures.   The wound was then irrigated, subcutaneous tissues closed in  two layers with  3-0 Vicryl suture and the skin closed with 5-0 subcuticular Vicryl suture,  painted with tinctured Benzoin and Steri-Strips and sterilely dressed.   I plan to keep to overnight and see how he actually does as far as length of  hospitalization. Sponge and needle count were correct at the end of the  case. The patient tolerated the procedure well and was transferred to the  recovery room in good condition.                                               Sandria Bales. Ezzard Standing, MD    DHN/MEDQ  D:  10/05/2002  T:  10/05/2002  Job:  [id number]   cc:   Montey Hora, PA  [facility]   Ernestina Penna, M.D.  [address] [place]  [place] [address]  Fax: [phone]

## 2011-02-03 NOTE — Letter (Signed)
Summary: [facility]  [facility]   Imported By: Lamona Curl CMA (AAMA) 01/29/2011 14:08:29  _____________________________________________________________________  External Attachment:    Type:   Image     Comment:   External Document

## 2011-02-12 NOTE — Letter (Signed)
Summary: [facility] Instructions  [facility]  [address] [place], [place] [address]   Phone: [phone]  Fax: [phone]       Judiann Ouderkirk    Jun 23, 1928    MRN: [id number]        Procedure Day /Date: Tuesday 03/10/11     Arrival Time: 9:00 am     Procedure Time: 10:00 am     Location of Procedure:                    _x _  [facility] (4th Floor)  PREPARATION FOR COLONOSCOPY WITH MOVIPREP   Starting 5 days prior to your procedure 03/05/11 do not eat nuts, seeds, popcorn, corn, beans, peas,  salads, or any raw vegetables.  Do not take any fiber supplements (e.g. Metamucil, Citrucel, and Benefiber).  THE DAY BEFORE YOUR PROCEDURE         DATE: 03/09/11  DAY: Monday  1.  Drink clear liquids the entire day-NO SOLID FOOD  2.  Do not drink anything colored red or purple.  Avoid juices with pulp.  No orange juice.  3.  Drink at least 64 oz. (8 glasses) of fluid/clear liquids during the day to prevent dehydration and help the prep work efficiently.  CLEAR LIQUIDS INCLUDE: Water Jello Ice Popsicles Tea (sugar ok, no milk/cream) Powdered fruit flavored drinks Coffee (sugar ok, no milk/cream) Gatorade Juice: apple, white grape, white cranberry  Lemonade Clear bullion, consomm, broth Carbonated beverages (any kind) Strained chicken noodle soup Hard Candy                             4.  In the morning, mix first dose of MoviPrep solution:    Empty 1 Pouch A and 1 Pouch B into the disposable container    Add lukewarm drinking water to the top line of the container. Mix to dissolve    Refrigerate (mixed solution should be used within 24 hrs)  5.  Begin drinking the prep at 5:00 p.m. The MoviPrep container is divided by 4 marks.   Every 15 minutes drink the solution down to the next mark (approximately 8 oz) until the full liter is complete.   6.  Follow completed prep with 16 oz of clear liquid of your choice (Nothing red or purple).  Continue to  drink clear liquids until bedtime.  7.  Before going to bed, mix second dose of MoviPrep solution:    Empty 1 Pouch A and 1 Pouch B into the disposable container    Add lukewarm drinking water to the top line of the container. Mix to dissolve    Refrigerate  THE DAY OF YOUR PROCEDURE      DATE: 03/10/11 DAY: Tuesday  Beginning at 5:00 a.m. (5 hours before procedure):         1. Every 15 minutes, drink the solution down to the next mark (approx 8 oz) until the full liter is complete.  2. Follow completed prep with 16 oz. of clear liquid of your choice.    3. You may drink clear liquids until 8:00 am (2 HOURS BEFORE PROCEDURE).   MEDICATION INSTRUCTIONS  Unless otherwise instructed, you should take regular prescription medications with a small sip of water   as early as possible the morning of your procedure.        OTHER INSTRUCTIONS  You will need a responsible adult at least 75 years  of age to accompany you and drive you home.   This person must remain in the waiting room during your procedure.  Wear loose fitting clothing that is easily removed.  Leave jewelry and other valuables at home.  However, you may wish to bring a book to read or  an iPod/MP3 player to listen to music as you wait for your procedure to start.  Remove all body piercing jewelry and leave at home.  Total time from sign-in until discharge is approximately 2-3 hours.  You should go home directly after your procedure and rest.  You can resume normal activities the  day after your procedure.  The day of your procedure you should not:   Drive   Make legal decisions   Operate machinery   Drink alcohol   Return to work  You will receive specific instructions about eating, activities and medications before you leave.    The above instructions have been reviewed and explained to me by   _______________________    I fully understand and can verbalize these instructions  _____________________________ Date _________

## 2011-02-12 NOTE — Assessment & Plan Note (Signed)
Summary: abnormal CT. add on per dr Juanda Chance conversation with Dr Dorinda Hill...   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 She has been having lower abdominal pain across the suprapubic area and right lower quadrant. The pain is localized anteriorly and is worse at night. It is relieved by turning on her side. It is relieved when she is standing or sitting. It has no relationship to bowel movements which are regular. The discomfort is getting better. She was put on antibiotics for a urinary tract infection and she is just finishing her Cipro. The symptoms are about 80% improved. A colonoscopy in 2001 showed a 2 cm lipoma in the right colon, mild diverticulosis of the left colon and a 5 mm hyperplastic polyp in the left colon. An upper endoscopy at that time showed a large 8-10 cm hiatal hernia with short esophagus and Cameron erosions. A repeat upper endoscopy in 2008 confirmed the presence of the hernia which at that time measured 11 cm and had a paraesophageal component. She was referred for a surgical evaluation by Dr. Ezzard Standing but I do not have any record of her seeing Dr Ezzard Standing. A recent CT scan of the abdomen shows a very large hiatal  hernia containing the entire stomach and mid transverse colon. She has a  thickened gastric wall circumferentially. She is status post cholecystectomy in 2003. There is a left kidney stone and bilateral groin hernias containing only fat. She has severe degenerative joint disease of L2-3 L3-4 and L4-5.   GI Review of Systems    Reports abdominal pain and  bloating.     Location of  Abdominal pain: RLQ.    Denies acid reflux, belching, chest pain, dysphagia with liquids, dysphagia with solids, heartburn, loss of appetite, nausea, vomiting,  vomiting blood, weight loss, and  weight gain.      Reports diarrhea, diverticulosis, and  irritable bowel syndrome.     Denies anal fissure, black tarry stools, change in bowel habit, constipation, fecal incontinence, heme positive stool, hemorrhoids, jaundice, light color stool, liver problems, rectal bleeding, and  rectal pain.    Current Medications (verified): 1)  Prometrium 200 Mg Caps (Progesterone Micronized) .Marland Kitchen.. 12 Q 4 Months 2)  Zoloft 25 Mg Tabs (Sertraline Hcl) .Marland Kitchen.. 1 By Mouth Daily 3)  Prilosec 20 Mg Cpdr (Omeprazole) .Marland Kitchen.. 1 By Mouth Daily 4)  Trimethoprim 100 Mg Tabs (Trimethoprim) .... As Needed 5)  Aspirin 325 Mg  Tabs (Aspirin) .Marland Kitchen.. 1 By Mouth Daily 6)  Vitamin D 1000 Unit  Tabs (Cholecalciferol) .Marland Kitchen.. 1 By Mouth Dfaily 7)  Calcium Carbonate   Powd (Calcium Carbonate) .Marland Kitchen.. 1 By Mouth Daily 8)  Multivitamins   Tabs (Multiple Vitamin) .Marland Kitchen.. 1 By Mouth Daily 9)  Hydrochlorothiazide 12.5 Mg Caps (Hydrochlorothiazide) .... One Daily 10)  Estradiol 0.025 Mg/24hr Ptwk (Estradiol) .... As Directed  Allergies (verified): 1)  ! Codeine  Past History:  Past Medical History: Reviewed history from 02/26/2010 and no changes required. COMPLEX ENDOMETRIAL HYPERPLASIA WITHOUT ATYPIA (ICD-Current Problems:  HYPERTENSION (ICD-401.9) OBESITY (ICD-278.00) COMPLEX ENDOMETRIAL HYPERPLASIA WITHOUT ATYPIA (ICD-621.32) ENDOMETRIAL POLYP (ICD-621.0) CANCER, FAMILY HX (ICD-V16.9) COLONIC POLYPS, HYPERPLASTIC (ICD-211.3) LIPOMA (ICD-214.9) FATTY LIVER DISEASE (ICD-571.8) DIVERTICULOSIS, COLON (ICD-562.10) SOB (ICD-786.05) GERD (ICD-530.81) CHEST PAIN, SUBSTERNAL (ICD-786.59) HIATAL HERNIA (ICD-553.3)  Past Surgical History: No hysterectomy  per patient Cholecystectomy 03-2002 Umbilical Hernia Repair 09-2002 Orthopedic Knee Surgery Optic neuritis D & C's x2 Cataract Extraction Breast Biopsy  Family History: Reviewed history from 01/29/2011 and no changes required. Noncontributory  for early coronary artery disease.  There is vascular disease in older first degree relatives. Family History of Diabetes: Mother, Sisters, Brother Family History of Heart Disease: Mother, Brother  Social History: The patient works at [employer].  Retired She is married and has 3 children.   She quit smoking in 1984.   She does not drink alcohol. Daily Caffeine Use  Review of Systems       The patient complains of back pain, hearing problems, shortness of breath, and swelling of feet/legs.  The patient denies allergy/sinus, anemia, anxiety-new, arthritis/joint pain, blood in urine, breast changes/lumps, change in vision, confusion, cough, coughing up blood, depression-new, fainting, fatigue, fever, headaches-new, heart murmur, heart rhythm changes, itching, menstrual pain, muscle pains/cramps, night sweats, nosebleeds, pregnancy symptoms, skin rash, sleeping problems, sore throat, swollen lymph glands, thirst - excessive , urination - excessive , urination changes/pain, urine leakage, vision changes, and voice change.         Pertinent positive and negative review of systems were noted in the above HPI. All other ROS was otherwise negative.   Vital Signs:  Patient profile:   77 year old female Height:      62 inches Weight:      176.13 pounds BMI:     32.33 Pulse rate:   68 / minute Pulse rhythm:   regular BP sitting:   150 / 64  (left arm) Cuff size:   regular  Vitals Entered By: June McMurray CMA Duncan Dull) (February 02, 2011 2:19 PM)  Physical Exam  General:  alert and oriented; appearing much younger than her stated age. She is in no distress. There is no cough or hoarseness. Eyes:  PERRLA, no icterus. Mouth:  No deformity or lesions, dentition normal. Neck:  Supple; no masses or thyromegaly. Lungs:  Clear throughout to auscultation. Heart:  Regular rate and rhythm; no murmurs, rubs,  or bruits. Abdomen:  soft abdomen with mild tenderness in the right groin and  suprapubic area. When standing up, there is no evidence of inguinal or femoral hernia. When laying down, there is a small periumbilical hernia. Bowel sounds are normoactive. There is no fullness or ascites. Liver edge at costal margin. Rectal:  soft Hemoccult negative stool. Extremities:  No clubbing, cyanosis, edema or deformities noted. Skin:  Intact without significant lesions or rashes. Psych:  Alert and cooperative. Normal mood and affect.   Impression & Recommendations:  Problem # 1:  DIVERTICULOSIS, COLON (ICD-562.10) Patient has lower abdominal pain which is now subsiding. There is no evidence of acute appendicitis or diverticulitis on a recent CT scan. I believe her abdominal symptoms are not related to her giant hiatal hernia or small inguinal hernia containing fat as per CT scan and I do not see any prolapse when she stands up. Her abdominal pain is mostly at night and I question the possibility of irritable bowel syndrome, symptomatic diverticulosis or possibly gynecological problems. She has  a history of uterine hyperplasia. The CT scan of the pelvis does not show any pelvic mass. She is due for a colonoscopy. I will give her Levsin sublingual 0.125 mg and schedule her for a colonoscopy.  Problem # 2:  HIATAL HERNIA (ICD-553.3) Patient has a large hiatal hernia which was initially measured at 8-10 cm in 2001 and is now measuring  11 cm as per recent endoscopy in 2008. She has to continue antireflux measures and takes Prilosec 20 mg daily. At this time, I do not think her hernia needs to be  surgically reduced but I am somewhat concerned about circumferential thickening of the gastric wall on a recent CT scan which needs to be further evaluated endoscopically.to r/o malignancy.  Other Orders: Colon/Endo (Colon/Endo)  Patient Instructions: 1)  You have been scheduled for an endoscopy and colonoscopy. Please follow written prep instructions that were given to you today at your visit. 2)   At this time, it seems that your large hiatal hernia is not related sto your lower abdominal discomfort. 3)  Continue strict antireflux measures. 4)  Take Levsin sublingual 0.125 mg p.r.n. cramping abdominal pain. 5)  Please pick up your prescription for Moviprep at the pharmacy. An electronic presription has already been sent. 6)  Copy sent to : Rudi Heap, MD, Dr Kyra Manges 7)  The medication list was reviewed and reconciled.  All changed / newly prescribed medications were explained.  A complete medication list was provided to the patient / caregiver. Prescriptions: LEVSIN/SL 0.125 MG SUBL (HYOSCYAMINE SULFATE) Dissolve 1 tablet under the tongue every 8 hours as needed.  #30 x 1   Entered by:   Lamona Curl CMA (AAMA)   Authorized by:   Hart Carwin MD   Signed by:   Lamona Curl CMA (AAMA) on 02/02/2011   Method used:   Electronically to        [facility] [phone]* (retail)       [address] [place], [place]  [address]       Ph: [phone] or [phone]       Fax: [phone]   RxID:   [id number] MOVIPREP 100 GM  SOLR (PEG-KCL-NACL-NASULF-NA ASC-C) As per prep instructions.  #1 x 0   Entered by:   Lamona Curl CMA (AAMA)   Authorized by:   Hart Carwin MD   Signed by:   Lamona Curl CMA (AAMA) on 02/02/2011   Method used:   Electronically to        [facility] [phone]* (retail)       [address] [place], [place]  [address]       Ph: [phone] or [phone]       Fax: [phone]   RxID:   [id number]

## 2011-03-02 ENCOUNTER — Encounter: Payer: Self-pay | Admitting: Internal Medicine

## 2011-03-02 NOTE — Telephone Encounter (Signed)
Error

## 2011-03-09 ENCOUNTER — Encounter: Payer: Self-pay | Admitting: Internal Medicine

## 2011-03-09 ENCOUNTER — Telehealth: Payer: Self-pay | Admitting: Internal Medicine

## 2011-03-09 NOTE — Telephone Encounter (Signed)
Left message with patient's family to have her call me.

## 2011-03-09 NOTE — Telephone Encounter (Signed)
Patient calling because she has bronchitis. She saw Dr. McPhail(PCP) today and he wants her to r/ss her colonoscopy that is due tomorrow because she has too much congestion. She was placed on a Z pack today. Patient is worried about being charged the late cancellation fee if she cancels today. Please, advise.

## 2011-03-09 NOTE — Telephone Encounter (Signed)
Per Dr. Juanda Chance do not charge late fee. R/S patient to 03/20/11 at 2:30 PM with 1:30 PM arrival

## 2011-03-10 ENCOUNTER — Encounter: Payer: Medicare Other | Admitting: Internal Medicine

## 2011-03-20 ENCOUNTER — Encounter: Payer: Self-pay | Admitting: *Deleted

## 2011-03-20 ENCOUNTER — Encounter: Payer: Self-pay | Admitting: Internal Medicine

## 2011-03-20 ENCOUNTER — Ambulatory Visit (AMBULATORY_SURGERY_CENTER): Payer: Medicare Other | Admitting: Internal Medicine

## 2011-03-20 VITALS — BP 160/66 | HR 63 | Temp 97.1°F | Resp 16 | Ht 62.0 in | Wt 172.0 lb

## 2011-03-20 DIAGNOSIS — Z1211 Encounter for screening for malignant neoplasm of colon: Secondary | ICD-10-CM

## 2011-03-20 DIAGNOSIS — R933 Abnormal findings on diagnostic imaging of other parts of digestive tract: Secondary | ICD-10-CM

## 2011-03-20 DIAGNOSIS — R1031 Right lower quadrant pain: Secondary | ICD-10-CM

## 2011-03-20 DIAGNOSIS — K449 Diaphragmatic hernia without obstruction or gangrene: Secondary | ICD-10-CM

## 2011-03-20 DIAGNOSIS — K5730 Diverticulosis of large intestine without perforation or abscess without bleeding: Secondary | ICD-10-CM

## 2011-03-20 MED ORDER — SODIUM CHLORIDE 0.9 % IV SOLN: 500.0000 mL | INTRAVENOUS | Status: DC

## 2011-03-20 NOTE — Patient Instructions (Signed)
Please read the handouts given to you by the recovery room nurse.   You have diverticulosis, and need to increase the fiber in your diet.  Dr. Juanda Chance thinks that you might have a diaphragmatic defect that might have to be repaired.   She would like to see you in the office in about a month.  Please call Monday for an appointment.  Resume your medications.   Feel free to call us if you have any questions at [phone].

## 2011-03-23 ENCOUNTER — Telehealth: Payer: Self-pay | Admitting: *Deleted

## 2011-03-23 NOTE — Telephone Encounter (Signed)
Follow up Call- Patient questions:  Do you have a fever, pain , or abdominal swelling? no Pain Score  0 *  Have you tolerated food without any problems? no  Have you been able to return to your normal activities? yes  Do you have any questions about your discharge instructions: Diet   no Medications  no Follow up visit  no  Do you have questions or concerns about your Care? yes Pt expressed concern about thickening of stomach that was noted on previous CT scan. Went over procedure report with pt and pt verbalized understanding of findings. Actions: * If pain score is 4 or above: No action needed, pain <4.

## 2011-04-13 ENCOUNTER — Encounter: Payer: Self-pay | Admitting: Internal Medicine

## 2011-04-13 ENCOUNTER — Ambulatory Visit (INDEPENDENT_AMBULATORY_CARE_PROVIDER_SITE_OTHER): Payer: Medicare Other | Admitting: Internal Medicine

## 2011-04-13 VITALS — BP 124/62 | HR 76 | Ht 62.0 in | Wt 172.0 lb

## 2011-04-13 DIAGNOSIS — R933 Abnormal findings on diagnostic imaging of other parts of digestive tract: Secondary | ICD-10-CM

## 2011-04-13 DIAGNOSIS — K219 Gastro-esophageal reflux disease without esophagitis: Secondary | ICD-10-CM

## 2011-04-13 NOTE — Progress Notes (Signed)
Kristy [name] 1928/07/19 MRN [id number]        History of Present Illness:  This is an 77 year old white female who recently underwent an upper endoscopy and colonoscopy to evaluate an abnormality on a recent CT scan suggesting thickened  folds in the gastric antrum. The upper endoscopy did not see any correlation with the CT scan. She has a large hiatal hernia and paraesophageal hernia which in 2008 measured 11 cm but was smaller on her most recent endoscopy. The colonoscopy showed mild diverticulosis. She remains asymptomatic. She denies dysphagia or odynophagia. She takes omeprazole 20 mg daily and sleeps with the head of bed elevated. She has been very careful about limiting the size of her meals.   Past Medical History  Diagnosis Date  . Hypertension   . Obesity   . Colon polyps   . Endometrial polyp   . SOB (shortness of breath)   . Diverticulosis   . GERD (gastroesophageal reflux disease)   . Hiatal hernia   . Endometrial hyperplasia without atypia, simple   . Hyperplastic colon polyp   . Lipoma   . Fatty liver    Past Surgical History  Procedure Date  . Cholecystectomy 03/2002  . Umbilical hernia repair 09/2002  . Knee surgery   . Optic neuritis   . Dilation and curettage of uterus     x 2  . Cataract extraction   . Breast biopsy     reports that she quit smoking about 28 years ago. She does not have any smokeless tobacco history on file. She reports that she does not drink alcohol or use illicit drugs. family history includes Diabetes in her brother, mother, and sister and Heart disease in her brother and mother.  There is no history of Colon cancer. Allergies  Allergen Reactions  . Codeine         Review of Systems: Denies dysphagia, odynophagia, chest pain, indigestion denies abdominal pain  The remainder of the 10  point ROS is negative except as outlined in H&P    Assessment and Plan:   Problem #1: CT scan abnormality suggested thickening of the  gastric wall but this was not found on the upper endoscopy. The abnormality might have represented gastric wall contracture and peristalsis. She is doing well symptom wise so no further evaluation is necessary. If she becomes symptomatic, I would suggest a barium esophagram and upper GI series. I have briefly mentioned the possibility of a laparoscopic assisted reduction of the hiatal and paraesophageal hernia should she become symptomatic with that. She will continue strict antireflux measures  Problem #2: Colon polyps. There were no polyps on her last colonoscopy. Due to her age, no recall is planned at this time.  Problem #3: Gastroesophageal reflux. Patient has a large hiatal and paraesophageal hernia., Hold all along surgical intervention   04/13/2011 Lina Sar

## 2011-04-13 NOTE — Patient Instructions (Signed)
Follow up as needed. CC: Dr Rudi Heap

## 2011-04-21 NOTE — Assessment & Plan Note (Signed)
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 Christell Constant.  She has a large hiatal hernia with recurrent chest pain and needs repeat  GI evaluation.   HISTORY OF PRESENT ILLNESS:  I previously saw Ms. Laver in 2001.  At  that time she gave a history of years of rather classic acid reflux, and  endoscopic exam showed a shortened esophagus with a large 10-cm hiatal  hernia with erosions in her hernia sac.  She responded well to medical  therapy at that time and I really have not seen her in several years.  At that time she also had colonoscopy, which showed a small right colon  lipoma, left colon diverticulosis, and some small colon polyps, which  were excised.  These were only hyperplastic polyps on pathologic exam.   She apparently had been on Nexium since that time and has been doing  well until recently, when she has had recurrent shortness of breath and  substernal chest pain and was seen by Dr. Christell Constant and referred to  cardiology, where she had extensive workup including apparently  Cardiolite stress testing which was normal.  This had previously been  done in 2004 and was unremarkable.  She was changed from Nexium 40 mg a  day to omeprazole 20 mg twice a day and has had improvement in her  symptomatology but has had associated dizziness.  She denies classic  acid reflux symptoms, dysphagia, odynophagia, or any hepatobiliary  symptomatology.  As far as I am aware, she has not had previous  cholecystectomy.   She is having regular bowel movements and denies melena or hematochezia.  She denies any specific food intolerances, anorexia or weight  loss.  Actually, when reviewing her chart she did have a cholecystectomy in  April 2003 and also had an umbilical hernia repair in October 2003.  Both of these were performed by Sandria Bales. Ezzard Standing, M.D.  CT scan of her  abdomen in January 2004 before her surgery showed a large hiatal hernia  with a paraesophageal component, fatty infiltration of the liver, and  the above-mentioned ventral  hernia repair.  She additionally had  diverticulosis seen on pelvic CT scan.   In August 2005 the patient underwent hysterectomy by Dr. Nicholas Lose and she  has some benign endometrial polyps and complex hyperplasia without  atypia.   She recently saw Dr. Rudi Heap because of these complaints and  underwent barium swallow exam at [facility], which was  interpreted by Dr. Amie Portland.  It shows a large hiatal hernia with a  mass effect in the distal esophagus and possible esophageal stricture.  I do not have any more recent lab data for review.   PAST MEDICAL HISTORY:  1. Remarkable for some cardiac  arrhythmias when the patient uses      excessive caffeine.  2. She has had, as mentioned above, a cholecystectomy, umbilical      hernia repair, and hysterectomy.  3. She has also had orthopedic knee surgery.   MEDICATIONS:  1. Prilosec 20 mg twice a day.  2. Zoloft 25 mg a day.  3. Aspirin 325 mg a day.  4. Estradiol patches weekly.  5. Calcium and multivitamins along with vitamin D daily.   She in the past had nausea and vomiting with codeine but denies true  drug allergies.   FAMILY HISTORY:  Remarkable for multiple types of cancer in various  siblings, including bladder cancer, lung cancer, and melanoma.  She has  a mother and two sisters with diabetes.   SOCIAL HISTORY:  She is married and lives with her husband.  She has  some college education.  She does not smoke, has never abused alcohol.   REVIEW OF SYSTEMS:  Otherwise noncontributory without current  cardiovascular or pulmonary  complaints.  She does not have chest pain  with exertion, as mentioned above had a negative cardiac recent  evaluation.   EXAMINATION:  She is a healthy-appearing white female in no acute  distress, appearing her stated age.  She is 5 feet 2-1/2 inches and weighs 189 pounds.  Blood pressure is  142/62 and pulse was 80 and regular.  I could not appreciate stigmata of chronic liver disease or thyromegaly.  CHEST:  Generally clear.  She was in a regular rhythm without murmurs, gallops or rubs.  I could not appreciate hepatosplenomegaly, abdominal masses or  tenderness.  Bowel sounds were normal.  Mental status was clear.  Her upper extremities were unremarkable.  RECTAL:  Exam not performed.   ASSESSMENT:  Ms. Grassi has a large hiatal hernia with a probable  paraesophageal component but has not been felt to be a surgical  candidate because of the marked foreshortening of her esophagus.   RECOMMENDATIONS:  I will go ahead and repeat her endoscopic exam as soon  as possible and try to make some determination as to whether or not  surgical intervention is in order.  She has had multiple surgeries, as  mentioned above, two within the last 5 years by Dr. Ezzard Standing,  and seems to have done well.  We would need to review her radiographs  and endoscopic report if surgery intervention is entertained.     Vania Rea. Jarold Motto, MD, Caleen Essex, FAGA  Electronically Signed    DRP/MedQ  DD: 11/08/2007  DT: 11/08/2007  Job #: [id number]   cc:   Ernestina Penna, M.D.  Sandria Bales. Ezzard Standing, M.D.

## 2011-04-21 NOTE — Assessment & Plan Note (Signed)
[facility]                            CARDIOLOGY OFFICE NOTE   NAME:Kristy [name], Kristy [name]                         MRN:          [id number]  DATE:09/21/2007                            DOB:          1928/02/12    PRIMARY CARE PHYSICIAN:  Dr. Vernon Prey.   REASON FOR PRESENTATION:  Evaluate patient with palpitations and  dyspnea.   HISTORY OF PRESENT ILLNESS:  The patient returns for follow up of the  above. She is actually no longer getting the palpitations if she avoids  caffeine. She has one cup of coffee in the morning and avoids it the  rest of day and does well. She does is not having any pre-syncope or  syncope. She has continued to have some dyspnea. She has been evaluated  for a large hiatal hernia. She also was sent for an echocardiogram in  late August. She is here to review this as well. The dyspnea is with  moderate activity, but not at rest. She is not describing PND or  orthopnea. It has been a chronic problem. She also has a cough when she  lays flat at night. The echocardiogram demonstrated normal left  ventricular function. There was some mild increase in left ventricular  wall thickness. There was some very mild elevated pulmonary pressures,  but nothing significant to explain dyspnea from a cardiovascular  standpoint. There may be some mild diastolic dysfunction.   The patient is not describing PND or orthopnea. She is able to do her  activities without severe limitations. She gets dyspneic with moderate  to exerting activity.   PAST MEDICAL HISTORY:  Optic neuritis, umbilical hernia repair, knee  surgery, cholecystectomy; hiatal hernia.   ALLERGIES:  CODEINE.   CURRENT MEDICATIONS:  1. Prilosec 40 mg daily.  2. Zoloft 25 mg daily.  3. Aspirin 325 mg daily.  4. Estradiol patch.  5. Trimethoprim p.r.n.  6. Prometrium.  7. Calcium.  8. Vitamin D.   REVIEW OF SYSTEMS:  As stated in the HPI, and otherwise for negative for  other  systems.   PHYSICAL EXAMINATION:  GENERAL:  The patient is in no distress.  VITAL SIGNS:  Blood pressure 146/80, heart rate 71 and regular, weight  188 pounds, body mass index 32.  HEENT:  Eyelids unremarkable, pupils equal, round, and reactive to  light, fundi not visualized, oral mucosa unremarkable.  NECK:  No jugular vein distension at 45 degrees, carotid upstroke brisk  and symmetric, no bruits, no thyromegaly.  LYMPHATICS:  No cervical, axillary, or inguinal adenopathy.  LUNGS:  Clear to auscultation bilaterally.  BACK:  No costovertebral angle tenderness.  CHEST:  Unremarkable.  HEART:  PMI not displaced or sustained, S1 and S2 within normal limits,  no S3, no S4, no clicks, rubs, or murmurs.  ABDOMEN:  Obese, positive bowel sounds, normal to frequency and pitch,  no bruits, no rebound, no guarding, no midline pulsatile mass, no  organomegaly.  SKIN:  No rashes, no nodules.  EXTREMITIES:  2+ pulses throughout, no cyanosis, clubbing, or edema.  NEUROLOGIC:  Oriented to person, place,  and time. Cranial nerves II-XII  are grossly intact, motor grossly intact.   EKG sinus rhythm, rate 71, left axis deviation, left anterior fascicular  block, poor anterior R-wave progression, no acute ST-T wave change.   ASSESSMENT AND PLAN:  1. Palpitations. These are no longer problematic for the patient as      long as she avoids caffeine. Therefore, no further cardiovascular      work up is suggested.  2. Dyspnea. The patient's dyspnea, I would suggest is probably not      cardiac in nature. There may be some small component of diastolic      dysfunction, but I would manage this simply with weight loss and      blood pressure control.  3. Hypertension. Question has arisen as to whether she has a      hypertension that needs medical management, however, she has got      the blood pressure diary and she has not had elevated readings      consistently. She seems to be more in the 120s to 130s  systolic      with normal diastolics. Therefore, she can continue with      therapeutic lifestyle changes.  4. Obesity. One of the therapeutic lifestyle changes should include      weight loss and she acknowledges this.  5. Follow up will be back as needed.     Rollene Rotunda, MD, [facility]  Electronically Signed    JH/MedQ  DD: 09/21/2007  DT: 09/22/2007  Job #: [id number]   cc:   Kristy [name], M.D.

## 2011-04-24 NOTE — Op Note (Signed)
[name], Kristy [name]                            ACCOUNT NO.:  [id number]   MEDICAL RECORD NO.:  [id number]                   PATIENT TYPE:  AMB   LOCATION:  [facility]                                 FACILITY:  [facility]   PHYSICIAN:  Gretta Cool, M.D.              DATE OF BIRTH:  [birth year]   DATE OF PROCEDURE:  DATE OF DISCHARGE:                                 OPERATIVE REPORT   PREOPERATIVE DIAGNOSES:  1. Endometrial polyps, multiple, recurrent.  2. History of resection of endometrial polyp in 1997, Dr. Katrinka Blazing.   POSTOPERATIVE DIAGNOSES:  1. Endometrial polyps, multiple, recurrent.  2. History of resection of endometrial polyp in 1997, Dr. Katrinka Blazing.   PROCEDURE:  Hysteroscopy, resection of endometrial polyps, total endometrial  resection for ablation.   SURGEON:  Gretta Cool, M.D.   ANESTHESIA:  IV sedation, paracervical block.   DESCRIPTION OF PROCEDURE:  Under excellent anesthesia as above with the  patient prepped and draped in Allen stirrups, cervix was progressively  dilated with a series of Pratt dilators to accommodate the 7 mm  resectoscope.  The resectoscope was then introduced and the polyps  identified and photographed.  The endometrial lining was quite thin except  for the polyps.  Both appeared normal epithelialized and benign.  The polyps  were then resected totally and submitted for pathology confirmation.  Once  the polyps had been resected, the entire endometrial cavity was then also  resected so as to remove the endometrium and source for recurrent polyps.  At the end of the procedure, the fluid deficit of 105 mL.  There was no  significant bleeding at reduced pressure.  The procedure is terminated  without complications.  The patient returned to the recovery room in  excellent condition.                                               Gretta Cool, M.D.    CWL/MEDQ  D:  07/22/2004  T:  07/22/2004  Job:  [id number]   cc:   Ernestina Penna, M.D.  [address] [place]  [place] [address]  Fax: [phone]

## 2011-04-24 NOTE — Op Note (Signed)
[facility]  Patient:    Kristy [name], Kristy [name] Visit Number: [id number] MRN: [id number]          Service Type: DSU Location: DAY Attending Physician:  Andre Lefort Dictated by:   Sandria Bales. Ezzard Standing, M.D. Proc. Date: 03/27/02 Admit Date:  03/27/2002   CC:         Montey Hora, P.A. at [facility]  Monica Becton, M.D.  Vania Rea. Jarold Motto, M.D. [facility]   Operative Report  DATE OF BIRTH:  04-28-1928  CCS NUMBER:  [id number]  PREOPERATIVE DIAGNOSIS:  Chronic cholecystitis and cholelithiasis.  POSTOPERATIVE DIAGNOSIS:  Chronic cholecystitis and cholelithiasis.  PROCEDURE:  Laparoscopic cholecystectomy with intraoperative cholangiogram.  SURGEON:  Sandria Bales. Ezzard Standing, M.D.  FIRST ASSISTANT:  Rose Phi. Maple Hudson, M.D.  ANESTHESIA:  General endotracheal.  ESTIMATED BLOOD LOSS:  Minimal.  INDICATIONS FOR PROCEDURE:  The patient is a 77 year old white female who has had reflux with a large hiatal hernia.  Has also had some epigastric pain felt to be associated with gallbladder disease.  She now comes for attempt at laparoscopic cholecystectomy.  The indications and potential complications of the procedure were explained to the patient.  DESCRIPTION OF PROCEDURE:  She presents to the operating room and underwent a general anesthesia supervised by Dr. Lestine Box.  She had 1 g of Ancef at the initiation of the procedure.  She had PAS stockings in place and an orogastric tube in place.  Her abdomen was prepped with Betadine solution and sterilely draped.  An infraumbilical incision was made with sharp dissection and carried down to the abdominal cavity.  A 0 degree 10 mm laparoscope was inserted through a 12 mm Hasson trocar.  The Hasson trocar was secured with an  0 Vicryl suture and laparoscopic evaluation, examining both lobes of the liver were unremarkable.  The anterior wall of the stomach was unremarkable. She had some  adhesions along the right colon and these appeared to be benign and minimal.  There was no other mass or lesions within the abdominal cavity.  Three additional trocars were placed.  A 10 mm Ethicon trocar in the subxiphoid location, a 5 mm Ethicon trocar in the right subcostal location, and a 5 mm Ethicon trocar in the right lateral subcostal location.  The gallbladder was then identified, grasped, and rotated cephalad.  About half way up the gallbladder wall, there were adhesions of omentum, and some adhesions between the gallbladder and the duodenum.  These were taken down with blunt and sharp dissection.  I carried down the dissection to the gallbladder cystic duct junction.  I identified the cystic artery and the cystic duct.  The cystic artery was triply Endoclipped and divided the cystic duct itself.  A clip was placed on the gallbladder side of the cystic duct and intraoperative cholangiogram was then obtained.  The intraoperative cholangiogram was obtained using a cut off Taut catheter and inserted through a 14-gauge Jelco catheter into the side of the cut cystic duct.  I then used half strength Hypaque solution, injecting about 6-8 cc under fluoroscopy.  This showed free flow of contrast down the cystic duct into the common bile duct into the duodenum and the hepatic radicles refluxed nicely. There was no obstruction and no mass in the filling defect and this was felt to be a normal intraoperative cholangiogram.  The Taut catheter was then removed.  The cystic duct triply Endoclipped and divided.  I then sharply and bluntly dissected the  gallbladder from the gallbladder bed using primarily hook Bovie coagulation.  Prior to complete division of the gallbladder from the gallbladder bed, I identified the triangle of Calot.  There was no bleeding or bile leak in this area, and I identified the gallbladder bed, and there was no bleeding or bile leak from that area.  The  gallbladder was then divided and delivered through the umbilicus intact and sent to pathology.  I then irrigated the abdomen with approximately 1 L of saline.  I then closed the umbilical port with a 0 Vicryl suture.  This actually took three or four sutures because it looked like she had a small umbilical hernia, but this was closed.  The skin edge site was closed with 5-0 Vicryl suture.  The ports had been directly visualized, being removed, and each port was infiltrated with 0.25% Marcaine for a total of about 20 cc of 0.25% Marcaine.  After the skin was closed with 5-0 Vicryl, I painted each wound with a tincture of Benzoin and Steri-Strips, and then sterilely dressed it.  She tolerated the procedure well and was transported to the recovery room in good condition.  Sponge and needle count were correct at the end of the case. Dictated by:   Sandria Bales. Ezzard Standing, M.D. Attending Physician:  Andre Lefort DD:  03/27/02 TD:  03/27/02 Job: [id number]

## 2011-04-24 NOTE — Op Note (Signed)
NAMESOPHEE, MCKIMMY                            ACCOUNT NO.:  [id number]   MEDICAL RECORD NO.:  [id number]                   PATIENT TYPE:  AMB   LOCATION:  DAY                                  FACILITY:  [facility]   PHYSICIAN:  Sandria Bales. Ezzard Standing, MD                 DATE OF BIRTH:  05-Jun-1928   DATE OF PROCEDURE:  DATE OF DISCHARGE:                                 OPERATIVE REPORT   CCS#:  [id number]   PREOPERATIVE DIAGNOSIS:  Inferior ventral incisional hernia.   POSTOPERATIVE DIAGNOSIS:  Infraumbilical ventral incisional hernia.   PROCEDURE:  Open hernia repair with mesh.   SURGEON:  Kristine Garbe. Ezzard Standing, M.D.   FIRST ASSISTANT:  None.   ANESTHESIA:  General endotracheal.   ESTIMATED BLOOD LOSS:  Minimal.   INDICATIONS FOR PROCEDURE:  Ms. Kerchner is a 77 year old white female who had  a laparoscopic cholecystectomy in April of 2003. At that time, she was noted  to have an infraumbilical hernia which I repaired at the time of the  laparoscopic hernia. However, she has noticed over the last 6-8 weeks an  increasing bulge in the lower umbilicus which is consistent with an  incisional hernia in this area. The diameter of this mass measures about 7  or 8 cm. The discussion throughout with the patient of the indications and  potential complications of repairing this hernia. The potential  complications included but not limited to infection, bleeding, and  recurrence of the hernia.   The patient now comes to the operating room for repair of this  infraumbilical incisional hernia.   DESCRIPTION OF PROCEDURE:  The patient was in supine position, given 1 gm of  Ancef at the initiation of the procedure. She had a general endotracheal  anesthetic. Her abdomen was prepped with Betadine solution and sterilely  draped. I made a linear incision immediately below the umbilicus and cut  down to the abdominal fascia. She had a fairly large hernia sac which I  dissected out of the subcutaneous  tissues down to an abdominal wall defect  which measured about 5 1/2 cm in diameter. I excised the sac, there was no  evidence of incarceration in the sac. I pulled the omentum down underneath  the hernia and then carried out a repair of the hernia.   I closed the hernia transversely with interrupted #1 Novofil sutures and  again this measured about 5 1/2 cm upon closure. I then laid over the top of  this a piece of atrium mesh trying to go about 2-3 cm around the hernia in  all directions. So I laid on an approximate 3 1/2 to 4 cm x 12 cm piece of  atrium mesh on top of this and sewed it in place using #0 Novofil sutures.   The wound was then irrigated, subcutaneous tissues closed in two layers with  3-0 Vicryl suture and  the skin closed with 5-0 subcuticular Vicryl suture,  painted with tinctured Benzoin and Steri-Strips and sterilely dressed.   I plan to keep to overnight and see how he actually does as far as length of  hospitalization. Sponge and needle count were correct at the end of the  case. The patient tolerated the procedure well and was transferred to the  recovery room in good condition.                                               Sandria Bales. Ezzard Standing, MD    DHN/MEDQ  D:  10/05/2002  T:  10/05/2002  Job:  [id number]   cc:   Montey Hora, PA  [facility]   Ernestina Penna, M.D.  [address] [place]  [place] [address]  Fax: [phone]

## 2011-04-24 NOTE — Procedures (Signed)
[facility]  Patient:    Kristy [name], Kristy [name]                         MRN: [id number] Proc. Date: 07/28/00 Adm. Date:  [id number] Attending:  Mardella Layman CC:         Rudi Heap, M.D., [place], [place].   Procedure Report  PROCEDURE:  Colonoscopy.  INDICATIONS FOR PROCEDURE:  Ms. Moors is a 77 year old white female who has a recurrent crampy abdominal pain, gas and bloating. She is scheduled for colonoscopy for surveillance purposes. The risks and benefits of the procedure were explained in detail, she agreed to proceed as planned. Preoperative cardiopulmonary and mental status exams were unremarkable.  COLONOSCOPY PROCEDURE:  Throughout this procedure the patient was on pulse oximeter and cardiac monitorization. She tolerated this procedure well receiving supplemental low flow oxygen by nasal cannula throughout the procedure. She was anesthetized with 75 mcg of IV fentanyl, 5 mg of IV Versed. Inspection of her rectum was unremarkable as was a rectal exam. Her rectum was intubated using the Olympus video colonoscope. This advanced easily and rapidly throughout the colon and cecum. The prep throughout the colon was excellent. The cecum was generally unremarkable. There was a small 2 cm lipoma in the proximal ascending colon. Picture obtained for documentation. The colonoscope was then withdrawn throughout the length of the colon. There were numerous diverticula throughout the sigmoid colon. At the junction of the sigmoid colon and descending colon, there was a 5 mm flat adenomatous appearing polyp. This was removed with electrocautery hot biopsy forceps, the tissue was sent to pathology for exam. There appeared to be bleeding from the polypectomy site, and it was injected with 1 cc of 1:10,000 epinephrine. There appeared to be good coagulation. Retroflexed view of the rectum showed some bulging but nonbleeding internal hemorrhoids. She was then extubated  without difficulty, and tolerated this procedure well.  ASSESSMENT:  1. Small right colon lipoma.  2. Left colon diverticulosis.  3. Small sigmoid colon polyp excised--rule out adenoma.  4. Nonbleeding internal hemorrhoids.  RECOMMENDATIONS:  1. Strict post polypectomy orders and precautions.  2. Follow-up on pathology with results.  3. Office follow-up in 1 months time. DD:  07/28/00 TD:  07/29/00 Job: [id number]

## 2011-04-24 NOTE — Procedures (Signed)
[facility]  Patient:    Kristy [name], Kristy [name]                         MRN: [id number] Proc. Date: 07/28/00 Adm. Date:  [id number] Attending:  Mardella Layman                           Procedure Report  PROCEDURE:  Endoscopy.  INDICATIONS FOR PROCEDURE:  Ms. Labus is a 77 year old white female who has had rather classic regurgitation and nocturnal acid reflux for many years. She has been on Prilosec for the last year with good improvement in her symptomatology. It is felt that endoscopy was indicated to clear underlying Barretts mucosa. The risks and benefits of this procedure were explained in detail, she agreed to proceed as planned. Preoperative cardiopulmonary and mental status exams were unremarkable.  ENDOSCOPY REPORT:  Throughout this procedure, the patient was on pulse oximetry and cardiac monitorization. She received supplemental low flow oxygen administration. She was intubated easily with the Olympus adult video endoscope. The hypopharynx and vocal cords appeared normal. The esophagus was generally unremarkable although the esophagus was shortened because of a large 8-10 cm sliding hiatal hernia. The endoscope was navigated through this large hernia into the stomach. Mucosal pattern of the fundus, body, antrum, and pylorus of the stomach was unremarkable as was the duodenal bulb and duodenal sweep. Retroflexed view of the fundus and cardia of the stomach showed this above mentioned large hiatal hernia. There were some linear Cameron erosions in the hernia sac. Again picture obtained for documentation. She was then extubated without difficulty, and tolerated this procedure well.  ASSESSMENT:  This patient has a large hiatal hernia with erosions in the hernia sac. She is high risk for severe acid reflux symptoms, and needs to be on chronic high dose acid suppressive therapy. I would not recommend surgery at this time unless she fails medical  management.  RECOMMENDATIONS:  1. Strict antireflux regime and maneuvers.  2. Increase her H2 blocker to Nexium 40 mg a day.  3. Office follow-up in 1 months time. DD:  07/28/00 TD:  07/29/00 Job: [id number]

## 2011-04-24 NOTE — Assessment & Plan Note (Signed)
[facility]                            CARDIOLOGY OFFICE NOTE   NAME:Kristy [name], Kristy [name]                         MRN:          [id number]  DATE:01/19/2007                            DOB:          08/13/1928    PRIMARY PHYSICIAN:  Dr. Ernestina Penna.   REASON FOR PRESENTATION:  Palpitations.   HISTORY OF PRESENT ILLNESS:  The patient presents for evaluation of  palpitations.  These have been going on for a while, but particularly  more in the last few months.  She described skipped heart beats.  She  does not notice them as much during the day.  She notices them more at  night.  She described skipping heart beat but does not describe any  sustained tachy arrhythmias.  She did wear a Holter monitor.  This  demonstrated premature atrial contractions and premature ventricular  contractions.  She did not report a diary; however, she reported that  she felt her symptoms during the monitor and there was no other  dysrhythmias recorded.  She otherwise is active.  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.  She does not get chest discomfort, neck discomfort,  arm discomfort, activity induced nausea or vomiting or excessive  diaphoresis.  She does not have PND or orthopnea.  She does drink  several caffeinated beverages a day.   PAST MEDICAL HISTORY:  Optic neuritis.   PAST SURGICAL HISTORY:  Umbilical hernia repair, knee surgery,  cholecystectomy.   ALLERGIES:  CODEINE.   MEDICATIONS:  1. Prilosec 20 mg daily.  2. Zoloft 25 mg daily.  3. Aspirin 325 mg daily.  4. Estradiol.  5. Trimethoprim.  6. Calcium.  7. Vitamin D.   SOCIAL HISTORY:  The patient works at [employer].  She is  married and has 3 children.  She quit smoking in 1984.  She does not  drink alcohol.   FAMILY HISTORY:  Noncontributory for early coronary artery disease.  There is vascular disease in older first degree relatives.   REVIEW OF SYSTEMS:  Positive for reflux.   Negative for all other  systems.   PHYSICAL EXAMINATION:  The patient is in no distress.  Blood pressure 136/76, heart rate 35 and regular, weight 194 pounds.  HEENT:  Eyelids unremarkable.  Pupils equal, round and reactive to  light.  Fundi within normal limits.  Oral mucosa unremarkable.  NECK:  No jugular venous distension.  Wave form within normal limits.  Carotid upstroke brisk and symmetric.  No bruits, no thyromegaly.  LYMPHATICS:  No cervical, axillary, or inguinal adenopathy.  LUNGS:  Clear to auscultation bilaterally.  BACK:  No costovertebral angle tenderness.  CHEST:  Unremarkable.  HEART:  PMI not displaced or sustained.  S1 and S2 within normal limits.  No S3, no S4, no clicks, no rubs, no murmurs.  ABDOMEN:  Mildly obese.  Positive bowel sounds, normal in frequency and  pitch.  No bruits, no rebound, no guarding, no midline pulsatile masses,  no hepatomegaly, no splenomegaly.  SKIN:  No rashes, no nodules.  EXTREMITIES:  Two plus  pulses throughout.  No edema, no cyanosis or  clubbing.  NEURO:  Oriented to person, place and time.  Cranial nerves 2 through 12  grossly intact.  Motor grossly intact.   EKG, sinus rhythm, rate 82, left axis deviation, left anterior  fascicular block, poor anterior R-wave progression, no acute ST-T wave  changes.   ASSESSMENT/PLAN:  1. Palpitations.  The patient is having palpations and premature      ectopic beats.  We discussed the nature of this.  I am going to get      an echocardiogram to make sure she has a structurally normal heart.      The first thing she is going to try is to cut out her caffeine.  If      these remains symptomatic, then will most likely proceed with a      beta blocker.  Further evaluation will be based on a response to      these therapies.  2. Hypertension.  She brings a blood pressure diary today where she      has some pressures above 140 and even the 150 range.  We discussed      therapeutic lifestyle  changes to include a little more weight loss      (she has been successful in this because of some dental problems      she had).  She may need a diuretic or if we are treating      palpitations with beta blocker going forward.  3. Follow up.  I would like to see her back in a couple of months.      She is going to call me if she has any increasing palpations.      Note, she did have labs concluded TSH and chemistry that were      normal.     Rollene Rotunda, MD, [facility]  Electronically Signed    JH/MedQ  DD: 01/21/2007  DT: 01/21/2007  Job #: [id number]   cc:   Ernestina Penna, M.D.

## 2011-05-20 ENCOUNTER — Other Ambulatory Visit: Payer: Self-pay | Admitting: *Deleted

## 2011-05-20 DIAGNOSIS — I10 Essential (primary) hypertension: Secondary | ICD-10-CM

## 2011-05-20 MED ORDER — HYDROCHLOROTHIAZIDE 12.5 MG PO CAPS: 12.5000 mg | ORAL_CAPSULE | Freq: Every day | ORAL | Status: DC

## 2011-10-09 ENCOUNTER — Encounter: Payer: Self-pay | Admitting: Cardiology

## 2011-10-14 ENCOUNTER — Encounter: Payer: Self-pay | Admitting: Cardiology

## 2011-10-14 ENCOUNTER — Ambulatory Visit (INDEPENDENT_AMBULATORY_CARE_PROVIDER_SITE_OTHER): Payer: Medicare Other | Admitting: Cardiology

## 2011-10-14 DIAGNOSIS — R0602 Shortness of breath: Secondary | ICD-10-CM

## 2011-10-14 DIAGNOSIS — R002 Palpitations: Secondary | ICD-10-CM

## 2011-10-14 DIAGNOSIS — I10 Essential (primary) hypertension: Secondary | ICD-10-CM

## 2011-10-14 NOTE — Assessment & Plan Note (Signed)
She is no longer having problems with this. No change in therapy is indicated.

## 2011-10-14 NOTE — Assessment & Plan Note (Signed)
Her blood pressure is up slightly but she is upset about the election.  No change in therapy is indicated.

## 2011-10-14 NOTE — Progress Notes (Signed)
HPI Patient presents for one-year followup. Since I saw her she has had no new problems. She continues to have some mild dyspnea with activity but she's able to do her household chores and walk up and down stairs without significant limitations. She's not describing any palpitations, presyncope or syncope. She's had no chest pressure, neck or arm discomfort. She has no PND or orthopnea.  Allergies  Allergen Reactions  . Codeine     Current Outpatient Prescriptions  Medication Sig Dispense Refill  . aspirin 325 MG tablet Take 325 mg by mouth daily.        . Calcium Carbonate 800 MG/2GM POWD Take by mouth daily.        . cholecalciferol (VITAMIN D) 1000 UNITS tablet Take 1,000 Units by mouth daily.        Marland Kitchen estradiol (VIVELLE-DOT) 0.025 MG/24HR Place 1 patch onto the skin. Weekly      . hydrochlorothiazide (,MICROZIDE/HYDRODIURIL,) 12.5 MG capsule Take 1 capsule (12.5 mg total) by mouth daily.  90 capsule  3  . ibuprofen (ADVIL,MOTRIN) 200 MG tablet Take 200 mg by mouth. As needed       . Multiple Vitamin (MULTIVITAMIN) tablet Take 1 tablet by mouth daily.        Marland Kitchen omeprazole (PRILOSEC) 20 MG capsule Take 20 mg by mouth daily.        . progesterone (PROMETRIUM) 200 MG capsule Take 200 mg by mouth. 12 tabs every four months       . sertraline (ZOLOFT) 25 MG tablet Take 25 mg by mouth daily.        Marland Kitchen trimethoprim (TRIMPEX) 100 MG tablet Take 100 mg by mouth as needed.         Current Facility-Administered Medications  Medication Dose Route Frequency Provider Last Rate Last Dose  . 0.9 %  sodium chloride infusion  500 mL Intravenous Continuous Hart Carwin, MD        Past Medical History  Diagnosis Date  . Hypertension   . Obesity   . Colon polyps   . Endometrial polyp   . SOB (shortness of breath)   . Diverticulosis   . GERD (gastroesophageal reflux disease)   . Hiatal hernia   . Endometrial hyperplasia without atypia, simple   . Hyperplastic colon polyp   . Lipoma   . Fatty  liver     Past Surgical History  Procedure Date  . Cholecystectomy 03/2002  . Umbilical hernia repair 09/2002  . Knee surgery   . Optic neuritis   . Dilation and curettage of uterus     x 2  . Cataract extraction   . Breast biopsy     ROS:  As stated in the HPI and negative for all other systems.  PHYSICAL EXAM BP 144/80  Pulse 75  Resp 16  Ht 5\' 2"  (1.575 m)  Wt 172 lb (78.019 kg)  BMI 31.46 kg/m2 GENERAL:  Well appearing HEENT:  Pupils equal round and reactive, fundi not visualized, oral mucosa unremarkable NECK:  No jugular venous distention, waveform within normal limits, carotid upstroke brisk and symmetric, no bruits, no thyromegaly LYMPHATICS:  No cervical, inguinal adenopathy LUNGS:  Clear to auscultation bilaterally BACK:  No CVA tenderness CHEST:  Unremarkable HEART:  PMI not displaced or sustained,S1 and S2 within normal limits, no S3, no S4, no clicks, no rubs, no murmurs ABD:  Flat, positive bowel sounds normal in frequency in pitch, no bruits, no rebound, no guarding, no midline pulsatile mass, no  hepatomegaly, no splenomegaly EXT:  2 plus pulses throughout, no edema, no cyanosis no clubbing SKIN:  No rashes no nodules NEURO:  Cranial nerves II through XII grossly intact, motor grossly intact throughout PSYCH:  Cognitively intact, oriented to person place and time  EKG:  Sinus rhythm, rate 74, left axis deviation, poor anterior R wave progression, no acute ST-T wave changes..   ASSESSMENT AND PLAN

## 2011-10-14 NOTE — Assessment & Plan Note (Signed)
Her dyspnea is mild and at baseline.  She had a normal echo last year.  No change in therapy is indicated.

## 2011-10-14 NOTE — Patient Instructions (Signed)
The current medical regimen is effective;  continue present plan and medications.  Follow up in 1 year with Dr Hochrein.  You will receive a letter in the mail 2 months before you are due.  Please call us when you receive this letter to schedule your follow up appointment.  

## 2011-10-15 ENCOUNTER — Telehealth: Payer: Self-pay | Admitting: Cardiology

## 2011-10-15 NOTE — Telephone Encounter (Signed)
Pt wants to go over her med list please call

## 2011-10-15 NOTE — Telephone Encounter (Signed)
Pt concerned because 0.9% saline was showing up on her medication list.  In review it appears that order was placed by Dr Juanda Chance in 4/12.  Order was discontinued.

## 2011-10-15 NOTE — Telephone Encounter (Signed)
Attempted to call - fast busy X 2.

## 2012-04-13 ENCOUNTER — Encounter: Payer: Self-pay | Admitting: Cardiology

## 2012-04-13 ENCOUNTER — Ambulatory Visit (INDEPENDENT_AMBULATORY_CARE_PROVIDER_SITE_OTHER): Payer: Medicare Other | Admitting: Cardiology

## 2012-04-13 VITALS — BP 145/70 | HR 77 | Ht 62.0 in | Wt 178.0 lb

## 2012-04-13 DIAGNOSIS — R002 Palpitations: Secondary | ICD-10-CM

## 2012-04-13 NOTE — Progress Notes (Signed)
HPI Patient presents for evaluation of PVCs.  She started experiencing these a few weeks. She would notice a skip every several beats. He was in particular at night. It was not associated with presyncope or syncope. She could not bring a long period he has not been having any other symptoms.  The patient denies any new symptoms such as chest discomfort, neck or arm discomfort. There has been no new shortness of breath, PND or orthopnea. She did notice that after she stopped taking fish oil this helped. She's also been drinking caffeinated tea.   Allergies  Allergen Reactions  . Codeine     Current Outpatient Prescriptions  Medication Sig Dispense Refill  . aspirin 325 MG tablet Take 325 mg by mouth daily.        . Calcium Carbonate 800 MG/2GM POWD Take by mouth daily.        . cholecalciferol (VITAMIN D) 1000 UNITS tablet Take 1,000 Units by mouth daily.        Marland Kitchen estradiol (VIVELLE-DOT) 0.025 MG/24HR Place 1 patch onto the skin. Weekly      . hydrochlorothiazide (,MICROZIDE/HYDRODIURIL,) 12.5 MG capsule Take 1 capsule (12.5 mg total) by mouth daily.  90 capsule  3  . ibuprofen (ADVIL,MOTRIN) 200 MG tablet Take 200 mg by mouth. As needed       . Multiple Vitamin (MULTIVITAMIN) tablet Take 1 tablet by mouth daily.        Marland Kitchen omeprazole (PRILOSEC) 20 MG capsule Take 20 mg by mouth daily.        . progesterone (PROMETRIUM) 200 MG capsule Take 200 mg by mouth. 12 tabs every four months       . sertraline (ZOLOFT) 25 MG tablet Take 25 mg by mouth daily.        Marland Kitchen trimethoprim (TRIMPEX) 100 MG tablet Take 100 mg by mouth as needed.          Past Medical History  Diagnosis Date  . Hypertension   . Obesity   . Colon polyps   . Endometrial polyp   . SOB (shortness of breath)   . Diverticulosis   . GERD (gastroesophageal reflux disease)   . Hiatal hernia   . Endometrial hyperplasia without atypia, simple   . Hyperplastic colon polyp   . Lipoma   . Fatty liver     Past Surgical History    Procedure Date  . Cholecystectomy 03/2002  . Umbilical hernia repair 09/2002  . Knee surgery   . Optic neuritis   . Dilation and curettage of uterus     x 2  . Cataract extraction   . Breast biopsy     ROS:  As stated in the HPI and negative for all other systems.  PHYSICAL EXAM There were no vitals taken for this visit. GENERAL:  Well appearing HEENT:  Pupils equal round and reactive, fundi not visualized, oral mucosa unremarkable NECK:  No jugular venous distention, waveform within normal limits, carotid upstroke brisk and symmetric, no bruits, no thyromegaly LYMPHATICS:  No cervical, inguinal adenopathy LUNGS:  Clear to auscultation bilaterally BACK:  No CVA tenderness CHEST:  Unremarkable HEART:  PMI not displaced or sustained,S1 and S2 within normal limits, no S3, no S4, no clicks, no rubs, no murmurs ABD:  Flat, positive bowel sounds normal in frequency in pitch, no bruits, no rebound, no guarding, no midline pulsatile mass, no hepatomegaly, no splenomegaly EXT:  2 plus pulses throughout, no edema, no cyanosis no clubbing  EKG:  Sinus rhythm, rate 78, left axis deviation, poor anterior R wave progression, no acute ST-T wave change. 04/13/2012   ASSESSMENT AND PLAN

## 2012-04-13 NOTE — Assessment & Plan Note (Signed)
The blood pressure is at target. No change in medications is indicated. We will continue with therapeutic lifestyle changes (TLC).  

## 2012-04-13 NOTE — Patient Instructions (Signed)
No changes. Follow up as scheduled.

## 2012-04-13 NOTE — Assessment & Plan Note (Signed)
The palpitations seem to have gone away after stopping fish oil. She's going to restart this to see if there is a true association. If her palpitations return she will stop completely. If they continue she will cut back on her caffeine. She's had thyroid study recently that was normal. She has a normal EF on echocardiography. No further testing is necessary. She will let me know if symptoms worsen and don't respond to the above plan.

## 2012-07-27 ENCOUNTER — Other Ambulatory Visit: Payer: Self-pay | Admitting: Cardiology

## 2012-07-27 NOTE — Telephone Encounter (Signed)
..   Requested Prescriptions   Pending Prescriptions Disp Refills  . hydrochlorothiazide (MICROZIDE) 12.5 MG capsule [Pharmacy Med Name: HYDROCHLOROT 12.5MG  CAP QUAL] 90 capsule 3    Sig: TAKE ONE CAPSULE BY MOUTH ONE TIME DAILY

## 2013-04-19 ENCOUNTER — Encounter: Payer: Self-pay | Admitting: Cardiology

## 2013-04-19 ENCOUNTER — Ambulatory Visit (INDEPENDENT_AMBULATORY_CARE_PROVIDER_SITE_OTHER): Payer: Medicare Other | Admitting: Cardiology

## 2013-04-19 VITALS — BP 137/72 | HR 82 | Ht 63.0 in | Wt 177.0 lb

## 2013-04-19 DIAGNOSIS — D179 Benign lipomatous neoplasm, unspecified: Secondary | ICD-10-CM

## 2013-04-19 DIAGNOSIS — R002 Palpitations: Secondary | ICD-10-CM

## 2013-04-19 NOTE — Patient Instructions (Addendum)
The current medical regimen is effective;  continue present plan and medications.  Follow up in 1 year with Dr Hochrein.  You will receive a letter in the mail 2 months before you are due.  Please call us when you receive this letter to schedule your follow up appointment.  

## 2013-04-19 NOTE — Progress Notes (Signed)
HPI Patient presents for evaluation of PVCs.  Since I last saw her she has had no new cardiovascular complaints. She does not feel any palpitations. She denies any presyncope or syncope. She will still get dyspneic with moderate activity but this is unchanged. She's not having any resting shortness of breath, PND or orthopnea. She has had no weight gain or edema. She is quite stressed taking care of her husband of 63 years who has early Alzheimer's.  Allergies  Allergen Reactions  . Codeine     Current Outpatient Prescriptions  Medication Sig Dispense Refill  . ALPRAZolam (XANAX) 0.25 MG tablet As needed      . Ascorbic Acid (VITAMIN C) 1000 MG tablet Take 1,000 mg by mouth daily.      Marland Kitchen aspirin 325 MG tablet Take 325 mg by mouth daily.        . Cholecalciferol (VITAMIN D3) 2000 UNITS TABS Take by mouth daily.      Marland Kitchen estradiol (VIVELLE-DOT) 0.025 MG/24HR Place 1 patch onto the skin. Weekly      . hydrochlorothiazide (MICROZIDE) 12.5 MG capsule TAKE ONE CAPSULE BY MOUTH ONE TIME DAILY  90 capsule  3  . ibuprofen (ADVIL,MOTRIN) 200 MG tablet Take 200 mg by mouth. As needed       . Multiple Vitamin (MULTIVITAMIN) tablet Take 1 tablet by mouth daily.        Marland Kitchen omeprazole (PRILOSEC) 20 MG capsule Take 20 mg by mouth daily.        . progesterone (PROMETRIUM) 200 MG capsule Take 200 mg by mouth. 12 tabs every four months       . sertraline (ZOLOFT) 25 MG tablet Take 25 mg by mouth daily.        Marland Kitchen trimethoprim (TRIMPEX) 100 MG tablet Take 100 mg by mouth as needed.         No current facility-administered medications for this visit.    Past Medical History  Diagnosis Date  . Hypertension   . Obesity   . Colon polyps   . Endometrial polyp   . SOB (shortness of breath)   . Diverticulosis   . GERD (gastroesophageal reflux disease)   . Hiatal hernia   . Endometrial hyperplasia without atypia, simple   . Hyperplastic colon polyp   . Lipoma   . Fatty liver     Past Surgical History    Procedure Laterality Date  . Cholecystectomy  03/2002  . Umbilical hernia repair  09/2002  . Knee surgery    . Optic neuritis    . Dilation and curettage of uterus      x 2  . Cataract extraction    . Breast biopsy      ROS:  As stated in the HPI and negative for all other systems.  PHYSICAL EXAM BP 137/72  Pulse 82  Ht 5\' 3"  (1.6 m)  Wt 177 lb (80.287 kg)  BMI 31.36 kg/m2 GENERAL:  Well appearing NECK:  No jugular venous distention, waveform within normal limits, carotid upstroke brisk and symmetric, no bruits, no thyromegaly LUNGS:  Clear to auscultation bilaterally CHEST:  Unremarkable HEART:  PMI not displaced or sustained,S1 and S2 within normal limits, no S3, no S4, no clicks, no rubs, no murmurs ABD:  Flat, positive bowel sounds normal in frequency in pitch, no bruits, no rebound, no guarding, no midline pulsatile mass, no hepatomegaly, no splenomegaly EXT:  2 plus pulses throughout, no edema, no cyanosis no clubbing PSYCH:  Tearful  EKG:  Sinus rhythm, , first degree AV block,rate 82, left axis deviation, poor anterior R wave progression, no acute ST-T wave changes, PACs. 04/19/2013   ASSESSMENT AND PLAN  PALPITATIONS:  These not particularly symptomatic. No change in therapy is indicated. I do think her biggest issue is probably anxiety and stress related to her husband.  HTN:  The blood pressure is at target. No change in medications is indicated. We will continue with therapeutic lifestyle changes (TLC).

## 2013-04-27 ENCOUNTER — Ambulatory Visit (INDEPENDENT_AMBULATORY_CARE_PROVIDER_SITE_OTHER): Payer: Medicare Other

## 2013-04-27 ENCOUNTER — Ambulatory Visit (INDEPENDENT_AMBULATORY_CARE_PROVIDER_SITE_OTHER): Payer: Medicare Other | Admitting: Orthopedic Surgery

## 2013-04-27 VITALS — BP 152/78 | Ht 61.5 in | Wt 177.0 lb

## 2013-04-27 DIAGNOSIS — M542 Cervicalgia: Secondary | ICD-10-CM

## 2013-04-27 DIAGNOSIS — M47812 Spondylosis without myelopathy or radiculopathy, cervical region: Secondary | ICD-10-CM

## 2013-04-27 MED ORDER — DICLOFENAC POTASSIUM 50 MG PO TABS: 50.0000 mg | ORAL_TABLET | Freq: Two times a day (BID) | ORAL | Status: DC

## 2013-04-27 NOTE — Patient Instructions (Addendum)
Start physical therapy at the facility of choice  Start diclofenac 50 mg twice a day  Diagnosis: Degenerative Disk Disease Degenerative disk disease is a condition caused by the changes that occur in the cushions of the backbone (spinal disks) as you grow older. Spinal disks are soft and compressible disks located between the bones of the spine (vertebrae). They act like shock absorbers. Degenerative disk disease can affect the whole spine. However, the neck and lower back are most commonly affected. Many changes can occur in the spinal disks with aging, such as:  The spinal disks may dry and shrink.  Small tears may occur in the tough, outer covering of the disk (annulus).  The disk space may become smaller due to loss of water.  Abnormal growths in the bone (spurs) may occur. This can put pressure on the nerve roots exiting the spinal canal, causing pain.  The spinal canal may become narrowed. CAUSES  Degenerative disk disease is a condition caused by the changes that occur in the spinal disks with aging. The exact cause is not known, but there is a genetic basis for many patients. Degenerative changes can occur due to loss of fluid in the disk. This makes the disk thinner and reduces the space between the backbones. Small cracks can develop in the outer layer of the disk. This can lead to the breakdown of the disk. You are more likely to get degenerative disk disease if you are overweight. Smoking cigarettes and doing heavy work such as weightlifting can also increase your risk of this condition. Degenerative changes can start after a sudden injury. Growth of bone spurs can compress the nerve roots and cause pain.  SYMPTOMS  The symptoms vary from person to person. Some people may have no pain, while others have severe pain. The pain may be so severe that it can limit your activities. The location of the pain depends on the part of your backbone that is affected. You will have neck or arm pain  if a disk in the neck area is affected. You will have pain in your back, buttocks, or legs if a disk in the lower back is affected. The pain becomes worse while bending, reaching up, or with twisting movements. The pain may start gradually and then get worse as time passes. It may also start after a major or minor injury. You may feel numbness or tingling in the arms or legs.  DIAGNOSIS  Your caregiver will ask you about your symptoms and about activities or habits that may cause the pain. He or she may also ask about any injuries, diseases, or treatments you have had earlier. Your caregiver will examine you to check for the range of movement that is possible in the affected area, to check for strength in your extremities, and to check for sensation in the areas of the arms and legs supplied by different nerve roots. An X-ray of the spine may be taken. Your caregiver may suggest other imaging tests, such as magnetic resonance imaging (MRI), if needed.  TREATMENT  Treatment includes rest, modifying your activities, and applying ice and heat. Your caregiver may prescribe medicines to reduce your pain and may ask you to do some exercises to strengthen your back. In some cases, you may need surgery. You and your caregiver will decide on the treatment that is best for you. HOME CARE INSTRUCTIONS   Follow proper lifting and walking techniques as advised by your caregiver.  Maintain good posture.  Exercise regularly  as advised.  Perform relaxation exercises.  Change your sitting, standing, and sleeping habits as advised. Change positions frequently.  Lose weight as advised.  Stop smoking if you smoke.  Wear supportive footwear. SEEK MEDICAL CARE IF:  Your pain does not go away within 1 to 4 weeks. SEEK IMMEDIATE MEDICAL CARE IF:   Your pain is severe.  You notice weakness in your arms, hands, or legs.  You begin to lose control of your bladder or bowel movements. MAKE SURE YOU:   Understand  these instructions.  Will watch your condition.  Will get help right away if you are not doing well or get worse. Document Released: 09/20/2007 Document Revised: 02/15/2012 Document Reviewed: 09/20/2007 [facility] Patient Information 2014 [place], [place].

## 2013-04-27 NOTE — Progress Notes (Signed)
Patient ID: Kristy [name], female   DOB: 09-Sep-1928, 79 y.o.   MRN: [id number] Chief Complaint  Patient presents with  . Arm Pain    Right arm pain.Marland Kitchen Referral from [facility].  . Leg Pain    Left leg pain. She had MRI of her L-spine back in March 2011.    History this is a G15-year-old female who presents with bilateral arm pain neck pain and pain in the left leg below the knee at night  She says she suffered for years. She describes the pain as coming on gradually without trauma. She reports throbbing stabbing burning pain especially when she turns to the left reaches behind her back. She rates her pain 8/10 she says it comes and goes. She's had 2 injections of steroids in the right shoulder with good relief. She wore a lateral force brace on her left knee and her leg pain stopped. She had an MRI in 2011 which showed minor spinal stenosis. X-ray lumbar spine also shows degenerative disc disease  She has an x-ray of her right shoulder and humerus which I was able to see and they look normal as well.  Review of systems 14 systems she short of breath snoring heartburn muscle pain dizziness anxiety easy bruising seasonal allergies other systems negative  Past Medical History  Diagnosis Date  . Hypertension   . Obesity   . Colon polyps   . Endometrial polyp   . SOB (shortness of breath)   . Diverticulosis   . GERD (gastroesophageal reflux disease)   . Hiatal hernia   . Endometrial hyperplasia without atypia, simple   . Hyperplastic colon polyp   . Lipoma   . Fatty liver    Past Surgical History  Procedure Laterality Date  . Cholecystectomy  03/2002  . Umbilical hernia repair  09/2002  . Knee surgery    . Optic neuritis    . Dilation and curettage of uterus      x 2  . Cataract extraction    . Breast biopsy      General appearance is normal, the patient is alert and oriented x3 with normal mood and affect. BP 152/78  Ht 5' 1.5" (1.562 m)  Wt 177 lb (80.287  kg)  BMI 32.91 kg/m2 Body habitus mesomorphic  No ambulation issues at this time  Cervical spine shows characteristic hyperextension of degenerative disease is normal flexion no pain with extension but she has pain when she rotates the neck to the left and range of motion deficit compared to rotating to the right is noted. There is no increased muscle tension there's tenderness at the base of her cervical spine. Her skin shows no rash in this area  Thoracic and lumbar spine are nontender normal alignment normal muscle tone normal skin no mass effusion noted.  Upper extremities show painless passive range of motion of both shoulders with negative impingement signs there is some slight decrease in flexion and external rotation. Both shoulders are stable. Drop arm test rotator cuff tear normal bilaterally skin intact without rash or lesion  Upper extremities show no sensory loss and pulses are excellent  Again x-rays of the shoulder and right humerus were normal. MRI 2011 showed mild spinal stenosis. X-ray shows degenerative disc disease.  The problem appears to be more neck related and shoulder related so we took some x-rays and they show the following severe spondylosis of the cervical spine  Diagnosis axial neck pain with cervical disc disease and spondylosis  Recommend physical therapy and anti-inflammatory medications. If no improvement we can certainly order an MRI of her cervical spine. If needed she can followup with [place] for continued pain if no improvement after 6 weeks of therapy and anti-inflammatories

## 2013-08-08 ENCOUNTER — Other Ambulatory Visit: Payer: Self-pay

## 2013-08-08 DIAGNOSIS — Z1231 Encounter for screening mammogram for malignant neoplasm of breast: Secondary | ICD-10-CM

## 2013-08-25 ENCOUNTER — Ambulatory Visit
Admission: RE | Admit: 2013-08-25 | Discharge: 2013-08-25 | Disposition: A | Discharge status: Home / Self Care | Payer: Medicare Other | Source: Ambulatory Visit

## 2013-08-25 DIAGNOSIS — Z1231 Encounter for screening mammogram for malignant neoplasm of breast: Secondary | ICD-10-CM

## 2013-08-30 ENCOUNTER — Other Ambulatory Visit: Payer: Self-pay | Admitting: Cardiology

## 2013-09-29 ENCOUNTER — Ambulatory Visit: Payer: Self-pay | Admitting: *Deleted

## 2013-09-29 DIAGNOSIS — Z23 Encounter for immunization: Secondary | ICD-10-CM

## 2013-09-29 NOTE — Progress Notes (Signed)
Patient tolerated well.

## 2013-10-03 ENCOUNTER — Ambulatory Visit: Payer: Self-pay

## 2013-10-24 ENCOUNTER — Other Ambulatory Visit: Payer: Self-pay | Admitting: Family Medicine

## 2013-10-26 NOTE — Telephone Encounter (Signed)
Last checkup and labs were done 01/13

## 2013-12-06 ENCOUNTER — Other Ambulatory Visit: Payer: Self-pay | Admitting: *Deleted

## 2013-12-06 MED ORDER — ALPRAZOLAM 0.25 MG PO TABS: 0.2500 mg | ORAL_TABLET | Freq: Every evening | ORAL | Status: DC | PRN

## 2013-12-06 NOTE — Telephone Encounter (Signed)
Patient aware that medication called into [facility] pharmacy

## 2013-12-18 ENCOUNTER — Ambulatory Visit (INDEPENDENT_AMBULATORY_CARE_PROVIDER_SITE_OTHER): Payer: Medicare Other | Admitting: Family Medicine

## 2013-12-18 ENCOUNTER — Encounter (HOSPITAL_COMMUNITY): Payer: Self-pay | Admitting: Emergency Medicine

## 2013-12-18 ENCOUNTER — Observation Stay (HOSPITAL_COMMUNITY)
Admission: EM | Admit: 2013-12-18 | Discharge: 2013-12-19 | Disposition: A | Discharge status: Home / Self Care | Payer: Medicare Other | Attending: Cardiology | Admitting: Cardiology

## 2013-12-18 ENCOUNTER — Encounter: Payer: Self-pay | Admitting: Family Medicine

## 2013-12-18 VITALS — BP 118/73 | HR 133 | Temp 97.0°F | Ht 61.5 in | Wt 171.8 lb

## 2013-12-18 DIAGNOSIS — Z79899 Other long term (current) drug therapy: Secondary | ICD-10-CM | POA: Insufficient documentation

## 2013-12-18 DIAGNOSIS — R002 Palpitations: Principal | ICD-10-CM | POA: Insufficient documentation

## 2013-12-18 DIAGNOSIS — K7689 Other specified diseases of liver: Secondary | ICD-10-CM | POA: Insufficient documentation

## 2013-12-18 DIAGNOSIS — Z860100 Personal history of colon polyps, unspecified: Secondary | ICD-10-CM | POA: Insufficient documentation

## 2013-12-18 DIAGNOSIS — K5730 Diverticulosis of large intestine without perforation or abscess without bleeding: Secondary | ICD-10-CM | POA: Insufficient documentation

## 2013-12-18 DIAGNOSIS — N840 Polyp of corpus uteri: Secondary | ICD-10-CM | POA: Insufficient documentation

## 2013-12-18 DIAGNOSIS — R000 Tachycardia, unspecified: Secondary | ICD-10-CM

## 2013-12-18 DIAGNOSIS — I480 Paroxysmal atrial fibrillation: Secondary | ICD-10-CM | POA: Diagnosis present

## 2013-12-18 DIAGNOSIS — E669 Obesity, unspecified: Secondary | ICD-10-CM | POA: Diagnosis present

## 2013-12-18 DIAGNOSIS — I10 Essential (primary) hypertension: Secondary | ICD-10-CM | POA: Diagnosis present

## 2013-12-18 DIAGNOSIS — I4891 Unspecified atrial fibrillation: Secondary | ICD-10-CM | POA: Insufficient documentation

## 2013-12-18 DIAGNOSIS — Z87891 Personal history of nicotine dependence: Secondary | ICD-10-CM | POA: Insufficient documentation

## 2013-12-18 DIAGNOSIS — N8501 Benign endometrial hyperplasia: Secondary | ICD-10-CM | POA: Insufficient documentation

## 2013-12-18 DIAGNOSIS — R0602 Shortness of breath: Secondary | ICD-10-CM

## 2013-12-18 DIAGNOSIS — Z888 Allergy status to other drugs, medicaments and biological substances status: Secondary | ICD-10-CM | POA: Insufficient documentation

## 2013-12-18 DIAGNOSIS — R0989 Other specified symptoms and signs involving the circulatory and respiratory systems: Secondary | ICD-10-CM | POA: Insufficient documentation

## 2013-12-18 DIAGNOSIS — K449 Diaphragmatic hernia without obstruction or gangrene: Secondary | ICD-10-CM | POA: Insufficient documentation

## 2013-12-18 DIAGNOSIS — Z7982 Long term (current) use of aspirin: Secondary | ICD-10-CM | POA: Insufficient documentation

## 2013-12-18 DIAGNOSIS — D179 Benign lipomatous neoplasm, unspecified: Secondary | ICD-10-CM | POA: Insufficient documentation

## 2013-12-18 DIAGNOSIS — R0609 Other forms of dyspnea: Secondary | ICD-10-CM | POA: Insufficient documentation

## 2013-12-18 DIAGNOSIS — K219 Gastro-esophageal reflux disease without esophagitis: Secondary | ICD-10-CM | POA: Insufficient documentation

## 2013-12-18 LAB — PRO B NATRIURETIC PEPTIDE: Pro B Natriuretic peptide (BNP): 1806 pg/mL — ABNORMAL HIGH (ref 0–450)

## 2013-12-18 LAB — BASIC METABOLIC PANEL
BUN: 19 mg/dL (ref 6–23)
CO2: 24 mEq/L (ref 19–32)
Calcium: 9.4 mg/dL (ref 8.4–10.5)
Chloride: 104 mEq/L (ref 96–112)
Creatinine, Ser: 1.18 mg/dL — ABNORMAL HIGH (ref 0.50–1.10)
GFR calc Af Amer: 48 mL/min — ABNORMAL LOW (ref >90)
GFR calc non Af Amer: 41 mL/min — ABNORMAL LOW (ref >90)
Glucose, Bld: 113 mg/dL — ABNORMAL HIGH (ref 70–99)
Potassium: 3.9 mEq/L (ref 3.7–5.3)
Sodium: 142 mEq/L (ref 137–147)

## 2013-12-18 LAB — CBC WITH DIFFERENTIAL/PLATELET
Basophils Absolute: 0 10*3/uL (ref 0.0–0.1)
Basophils Relative: 0 % (ref 0–1)
Eosinophils Absolute: 0.1 10*3/uL (ref 0.0–0.7)
Eosinophils Relative: 1 % (ref 0–5)
HCT: 39.7 % (ref 36.0–46.0)
Hemoglobin: 13.4 g/dL (ref 12.0–15.0)
Lymphocytes Relative: 23 % (ref 12–46)
Lymphs Abs: 2.3 10*3/uL (ref 0.7–4.0)
MCH: 30 pg (ref 26.0–34.0)
MCHC: 33.8 g/dL (ref 30.0–36.0)
MCV: 89 fL (ref 78.0–100.0)
Monocytes Absolute: 0.6 10*3/uL (ref 0.1–1.0)
Monocytes Relative: 6 % (ref 3–12)
Neutro Abs: 7.2 10*3/uL (ref 1.7–7.7)
Neutrophils Relative %: 70 % (ref 43–77)
Platelets: 352 10*3/uL (ref 150–400)
RBC: 4.46 MIL/uL (ref 3.87–5.11)
RDW: 14.2 % (ref 11.5–15.5)
WBC: 10.2 10*3/uL (ref 4.0–10.5)

## 2013-12-18 LAB — MAGNESIUM: Magnesium: 1.7 mg/dL (ref 1.5–2.5)

## 2013-12-18 LAB — TROPONIN I: Troponin I: <0.3 ng/mL

## 2013-12-18 NOTE — Progress Notes (Signed)
Subjective:    Patient ID: Kristy [name], female    DOB: 1928/03/15, 80 y.o.   MRN: [id number]  HPI Patient here today for elevated heart rate after episode of SOB and fatigue after vacuuming the floor.      Patient Active Problem List   Diagnosis Date Noted  . Cervical spondylosis without myelopathy 04/27/2013  . ABDOMINAL PAIN, UNSPECIFIED SITE 02/02/2011  . Palpitations 02/26/2010  . COLONIC POLYPS, HYPERPLASTIC 12/07/2007  . LIPOMA 12/07/2007  . OBESITY 12/07/2007  . HYPERTENSION 12/07/2007  . GERD 12/07/2007  . HIATAL HERNIA 12/07/2007  . DIVERTICULOSIS, COLON 12/07/2007  . FATTY LIVER DISEASE 12/07/2007  . ENDOMETRIAL POLYP 12/07/2007  . COMPLEX ENDOMETRIAL HYPERPLASIA WITHOUT ATYPIA 12/07/2007  . SOB 12/07/2007   Outpatient Encounter Prescriptions as of 12/18/2013  Medication Sig  . aspirin 325 MG tablet Take 325 mg by mouth daily.    . Cholecalciferol (VITAMIN D3) 2000 UNITS TABS Take by mouth daily.  Marland Kitchen estradiol (VIVELLE-DOT) 0.025 MG/24HR Place 1 patch onto the skin. Weekly  . hydrochlorothiazide (MICROZIDE) 12.5 MG capsule TAKE ONE CAPSULE BY MOUTH ONE TIME DAILY  . Multiple Vitamin (MULTIVITAMIN) tablet Take 1 tablet by mouth daily.    Marland Kitchen omeprazole (PRILOSEC) 20 MG capsule TAKE ONE CAPSULE BY MOUTH TWICE DAILY  . progesterone (PROMETRIUM) 200 MG capsule Take 200 mg by mouth. 12 tabs every four months   . sertraline (ZOLOFT) 50 MG tablet TAKE ONE TABLET BY MOUTH ONE TIME DAILY  . trimethoprim (TRIMPEX) 100 MG tablet Take 100 mg by mouth as needed.    . [DISCONTINUED] Ascorbic Acid (VITAMIN C) 1000 MG tablet Take 1,000 mg by mouth daily.  Marland Kitchen ALPRAZolam (XANAX) 0.25 MG tablet Take 1 tablet (0.25 mg total) by mouth at bedtime as needed for anxiety. As needed  . diclofenac (CATAFLAM) 50 MG tablet Take 1 tablet (50 mg total) by mouth 2 (two) times daily.    Review of Systems  Constitutional: Negative.  Negative for fatigue.  HENT: Negative.   Eyes: Negative.     Respiratory: Negative.  Negative for cough and shortness of breath.   Cardiovascular: Negative.  Negative for chest pain.  Gastrointestinal: Negative.   Endocrine: Negative.   Genitourinary: Negative.   Musculoskeletal: Negative.   Skin: Negative.   Allergic/Immunologic: Negative.   Neurological: Negative.  Negative for dizziness and headaches.  Hematological: Negative.   Psychiatric/Behavioral: Negative.        Objective:   Physical Exam  Nursing note and vitals reviewed. Constitutional: She is oriented to person, place, and time. She appears well-developed and well-nourished. No distress.  HENT:  Head: Normocephalic and atraumatic.  Right Ear: External ear normal.  Left Ear: External ear normal.  Nose: Nose normal.  Mouth/Throat: Oropharynx is clear and moist.  Eyes: Conjunctivae and EOM are normal. Pupils are equal, round, and reactive to light. Right eye exhibits no discharge. Left eye exhibits no discharge. No scleral icterus.  Neck: Normal range of motion. Neck supple. No JVD present. No thyromegaly present.  Cardiovascular: Normal heart sounds and intact distal pulses.  Exam reveals no gallop and no friction rub.   No murmur heard. Patient has an irregular irregular rhythm ranging from 120 to 142  Pulmonary/Chest: Effort normal and breath sounds normal. No respiratory distress. She has no wheezes. She has no rales. She exhibits no tenderness.  Abdominal: Soft. Bowel sounds are normal. She exhibits no mass. There is no tenderness. There is no rebound and no guarding.  Musculoskeletal: Normal range  of motion. She exhibits no edema.  Lymphadenopathy:    She has no cervical adenopathy.  Neurological: She is alert and oriented to person, place, and time. No cranial nerve deficit.  Skin: Skin is warm and dry.  Psychiatric: She has a normal mood and affect. Her behavior is normal. Judgment and thought content normal.   BP 118/73  Pulse 133  Temp(Src) 97 F (36.1 C) (Oral)   Ht 5' 1.5" (1.562 m)  Wt 171 lb 12.8 oz (77.928 kg)  BMI 31.94 kg/m2  EKG: Atrial fibrillation  Cardiology was called and they felt it would be better if I checked the patient tonight and gave her something for rate control in the emergency room. Patient's transportation has been arranged and her daughter has been called to meet her at the emergency room.       Assessment & Plan:  1. Elevated pulse rate - EKG 12-Lead  2. Atrial fibrillation -Patient will be taken to the emergency room by one of her relatives. -She understands and she will be seeing the cardiologist what she arrives there   Patient Instructions  Patient is to go to the emergency room at [facility] Cardiology is to be called to evaluate and treat her atrial fib She will take the cardiac tracing with her One of her relatives will take her to the emergency room her [place] will be called to meet them at the emergency room   Arrie Senate MD

## 2013-12-18 NOTE — ED Provider Notes (Signed)
CSN: [id number]     Arrival date & time 12/18/13  1931 History   First MD Initiated Contact with Patient 12/18/13 2112     Chief Complaint  Patient presents with  . Palpitations  . Tachycardia   (Consider location/radiation/quality/duration/timing/severity/associated sxs/prior Treatment) HPI  80 year old female with palpitations. Onset early this afternoon while vacuuming. Associated with dyspnea. Denies any chest pain. She sat down to rest it felt like her heart container raise. She went to check her blood pressure noted a pulse up into the 120s. She went to see her PCP because of this and the way she was feeling. Check EKG in our office which showed atrial fibrillation. No onset. Because of her symptoms she was referred to the emergency room. She currently feels better. Denies any continued palpitations although still noted to be in atrial fibrillation on the monitor although heart rate around 100. No chest pain. No shortness of breath. Prior to today she mentions intermittent episodes of what she calls dizziness but on further discussion actually describes vertigo. No syncope or presyncopal symptoms. No hx of CAD. Cardiologist Dr Percival Spanish. She does have a history of hypertension. Previous TIA which is on 325 mg of aspirin for. No hx of thyroid disease. Denies etoh.   Past Medical History  Diagnosis Date  . Hypertension   . Obesity   . Colon polyps   . Endometrial polyp   . SOB (shortness of breath)   . Diverticulosis   . GERD (gastroesophageal reflux disease)   . Hiatal hernia   . Endometrial hyperplasia without atypia, simple   . Hyperplastic colon polyp   . Lipoma   . Fatty liver    Past Surgical History  Procedure Laterality Date  . Cholecystectomy  03/2002  . Umbilical hernia repair  09/2002  . Knee surgery    . Optic neuritis    . Dilation and curettage of uterus      x 2  . Cataract extraction    . Breast biopsy    . Hernia repair     Family History  Problem Relation  Age of Onset  . Diabetes Mother   . Heart disease Mother   . Diabetes Sister   . Diabetes Brother   . Heart disease Brother   . Colon cancer Neg Hx    History  Substance Use Topics  . Smoking status: Former Smoker    Quit date: 12/07/1982  . Smokeless tobacco: Not on file  . Alcohol Use: No   OB History   Grav Para Term Preterm Abortions TAB SAB Ect Mult Living                 Review of Systems  All systems reviewed and negative, other than as noted in HPI.   Allergies  Codeine and Tramadol  Home Medications   Current Outpatient Rx  Name  Route  Sig  Dispense  Refill  . ALPRAZolam (XANAX) 0.25 MG tablet   Oral   Take 0.25 mg by mouth daily as needed for anxiety.         Marland Kitchen aspirin EC 325 MG tablet   Oral   Take 325 mg by mouth daily.         . Cholecalciferol (VITAMIN D3) 2000 UNITS TABS   Oral   Take by mouth daily.         . diclofenac (VOLTAREN) 50 MG EC tablet   Oral   Take 50 mg by mouth 2 (two) times daily as  needed for mild pain.         Marland Kitchen estradiol (VIVELLE-DOT) 0.025 MG/24HR   Transdermal   Place 1 patch onto the skin every Sunday.          . hydrochlorothiazide (MICROZIDE) 12.5 MG capsule   Oral   Take 12.5 mg by mouth daily.         . Multiple Vitamin (MULTIVITAMIN) tablet   Oral   Take 1 tablet by mouth daily.           Marland Kitchen omeprazole (PRILOSEC) 20 MG capsule   Oral   Take 20 mg by mouth every morning.         Marland Kitchen omeprazole (PRILOSEC) 20 MG capsule   Oral   Take 20 mg by mouth daily as needed (as needed for heartburn in the evening).         Vladimir Faster Glycol-Propyl Glycol (SYSTANE OP)   Ophthalmic   Apply 1-2 drops to eye 4 (four) times daily as needed (dry eyes).         . progesterone (PROMETRIUM) 200 MG capsule   Oral   Take 200 mg by mouth See admin instructions. 12 tabs every four months         . sertraline (ZOLOFT) 50 MG tablet   Oral   Take 50 mg by mouth daily.         Marland Kitchen trimethoprim (TRIMPEX)  100 MG tablet   Oral   Take 100 mg by mouth daily as needed (for 3 days as needed for urinary tract infection).           BP 118/58  Pulse 110  Temp(Src) 98 F (36.7 C) (Oral)  Resp 14  SpO2 96% Physical Exam  Nursing note and vitals reviewed. Constitutional: She appears well-developed and well-nourished. No distress.  Laying in bed. No acute distress. Pleasant and conversive.  HENT:  Head: Normocephalic and atraumatic.  Eyes: Conjunctivae are normal. Right eye exhibits no discharge. Left eye exhibits no discharge.  Neck: Neck supple.  Cardiovascular: Regular rhythm and normal heart sounds.  Exam reveals no gallop and no friction rub.   No murmur heard. Mild tachycardia with an irregularly irregular rate  Pulmonary/Chest: Effort normal and breath sounds normal. No respiratory distress.  Abdominal: Soft. She exhibits no distension. There is no tenderness.  Musculoskeletal: She exhibits no edema and no tenderness.  Neurological: She is alert.  Skin: Skin is warm and dry.  Psychiatric: She has a normal mood and affect. Her behavior is normal. Thought content normal.    ED Course  Procedures (including critical care time) Labs Review Labs Reviewed  BASIC METABOLIC PANEL - Abnormal; Notable for the following:    Glucose, Bld 113 (*)    Creatinine, Ser 1.18 (*)    GFR calc non Af Amer 41 (*)    GFR calc Af Amer 48 (*)    All other components within normal limits  PRO B NATRIURETIC PEPTIDE - Abnormal; Notable for the following:    Pro B Natriuretic peptide (BNP) 1806.0 (*)    All other components within normal limits  BASIC METABOLIC PANEL - Abnormal; Notable for the following:    Glucose, Bld 113 (*)    Creatinine, Ser 1.16 (*)    GFR calc non Af Amer 42 (*)    GFR calc Af Amer 49 (*)    All other components within normal limits  HEPARIN LEVEL (UNFRACTIONATED) - Abnormal; Notable for the following:    Heparin Unfractionated <  0.10 (*)    All other components within  normal limits  CBC WITH DIFFERENTIAL  TSH  MAGNESIUM  TROPONIN I  TSH  HEPARIN LEVEL (UNFRACTIONATED)  CBC   Imaging Review No results found.  EKG Interpretation    Date/Time:  Monday December 18 2013 19:37:01 EST Ventricular Rate:  135 PR Interval:    QRS Duration: 74 QT Interval:  312 QTC Calculation: 468 R Axis:   -83 Text Interpretation:  Atrial flutter with 2:1 A-V conduction Left axis deviation Poor R wave progression Inferior Q waves noted Non-specific ST-t changes atrial fibrillation new from previous Confirmed by [place]  MD, Iyad Deroo ([id number]) on 12/18/2013 11:59:51 PM            MDM   1. PAF (paroxysmal atrial fibrillation)   2. Palpitations   3. Shortness of breath   4. Unspecified essential hypertension   5. HYPERTENSION     80 year old female with palpitations. New onset of atrial fibrillation. Rate is reasonable around 100. She is now asymptomatic. With her age, history of hypertension and previous TIA she likely needs anticoagulation. She has establish cardiology care.  I discussed the case with cardiology fellow. Recommending admitting the patient to anticoagulate and attempt cardioversion.     Virgel Manifold, MD 12/22/13 [phone]

## 2013-12-18 NOTE — H&P (Signed)
History and Physical  Patient ID: Kristy [name] MRN: [id number], DOB: Sep 14, 1928 Date of Encounter: 12/18/2013, 10:42 PM Primary Physician: Redge Gainer, MD Primary Cardiologist: Minus Breeding   Chief Complaint: Palpitations  Reason for Admission: Afib   HPI: 80 yr old female with hx of palpitations , PVC , HTN being admitted for Afib   Pt states that she was having lightheadedness/ near syncope symptoms earlier in the day that prompted her to sit down. She checked her BP which was stable , however HR was in the 140's . She was subsequently seen in the PCP office who did an EKG that demonstrated Afib. Apparently Dr Percival Spanish office was contacted and the pt was asked to come into the ER. Over here the pt denies any symptoms currently . Pt denies any chest pain ,  orthopnea, PND , LE edema , Syncope ,claudcation , focal weakness, or bleeding diathesis . Has chronic DOE .  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 Hypertension   . Obesity   . Colon polyps   . Endometrial polyp   . SOB (shortness of breath)   . Diverticulosis   . GERD (gastroesophageal reflux disease)   . Hiatal hernia   . Endometrial hyperplasia without atypia, simple   . Hyperplastic colon polyp   . Lipoma   . Fatty liver      Most Recent Cardiac Studies:  03/2010 - Left ventricle: The cavity size was normal. Wall thickness was normal. Systolic function was normal. The estimated ejection fraction was in the range of 55% to 60%. Wall motion was normal; there were no regional wall motion abnormalities. Features are consistent with a pseudonormal left ventricular  filling pattern, with concomitant abnormal relaxation and increased filling pressure (grade 2 diastolic dysfunction). - Atrial septum: Not well seen. May be aneurysmal with no ASD Not clinically significant   Surgical History:  Past Surgical History  Procedure Laterality Date  . Cholecystectomy  03/2002  . Umbilical hernia repair  09/2002  . Knee surgery    . Optic neuritis    . Dilation and curettage of uterus      x 2  . Cataract extraction    . Breast biopsy    . Hernia repair       Home Meds: Prior to Admission medications   Medication Sig Start Date End Date Taking? Authorizing Provider  ALPRAZolam Duanne Moron) 0.25 MG tablet Take 0.25 mg by mouth daily as needed for anxiety.   Yes Historical Provider, MD  aspirin EC 325 MG tablet Take 325 mg by mouth daily.   Yes Historical Provider, MD  Cholecalciferol (VITAMIN D3) 2000 UNITS TABS Take by mouth daily.   Yes Historical Provider, MD  diclofenac (VOLTAREN) 50 MG EC tablet Take 50 mg by mouth 2 (two) times daily as needed for mild pain.   Yes Historical Provider, MD  estradiol (VIVELLE-DOT) 0.025 MG/24HR Place 1 patch onto the skin every Sunday.    Yes Historical Provider, MD  hydrochlorothiazide (MICROZIDE) 12.5 MG capsule Take 12.5 mg by mouth daily.  Yes Historical Provider, MD  Multiple Vitamin (MULTIVITAMIN) tablet Take 1 tablet by mouth daily.     Yes Historical Provider, MD  omeprazole (PRILOSEC) 20 MG capsule Take 20 mg by mouth every morning.   Yes Historical Provider, MD  omeprazole (PRILOSEC) 20 MG capsule Take 20 mg by mouth daily as needed (as needed for heartburn in the evening).   Yes Historical Provider, MD  Polyethyl Glycol-Propyl Glycol (SYSTANE OP) Apply 1-2 drops to eye 4 (four) times daily as needed (dry eyes).   Yes Historical Provider, MD  progesterone (PROMETRIUM) 200 MG capsule Take 200 mg by mouth See admin instructions. 12 tabs every four months   Yes Historical Provider, MD  sertraline (ZOLOFT) 50 MG  tablet Take 50 mg by mouth daily.   Yes Historical Provider, MD  trimethoprim (TRIMPEX) 100 MG tablet Take 100 mg by mouth daily as needed (for 3 days as needed for urinary tract infection).    Yes Historical Provider, MD    Allergies:  Allergies  Allergen Reactions  . Codeine Nausea Only  . Tramadol Nausea Only    History   Social History  . Marital Status: Married    Spouse Name: N/A    Number of Children: N/A  . Years of Education: N/A   Occupational History  . [employer]    Social History Main Topics  . Smoking status: Former Smoker    Quit date: 12/07/1982  . Smokeless tobacco: Not on file  . Alcohol Use: No  . Drug Use: No  . Sexual Activity: Not on file   Other Topics Concern  . Not on file   Social History Narrative  . No narrative on file     Family History  Problem Relation Age of Onset  . Diabetes Mother   . Heart disease Mother   . Diabetes Sister   . Diabetes Brother   . Heart disease Brother   . Colon cancer Neg Hx     Review of Systems: General: negative for chills, fever, night sweats or weight changes.  Cardiovascular:see HPI  Dermatological: negative for rash Respiratory: negative for cough or wheezing Urologic: negative for hematuria Abdominal: negative for nausea, vomiting, diarrhea, bright red blood per rectum, melena, or hematemesis Neurologic: negative for visual changes, syncope, or dizziness All other systems reviewed and are otherwise negative except as noted above.   Labs:   Lab Results  Component Value Date   WBC 10.2 12/18/2013   HGB 13.4 12/18/2013   HCT 39.7 12/18/2013   MCV 89.0 12/18/2013   PLT 352 12/18/2013    Recent Labs Lab 12/18/13 2123  NA 142  K 3.9  CL 104  CO2 24  BUN 19  CREATININE 1.18*  CALCIUM 9.4  GLUCOSE 113*    Recent Labs  12/18/13 2123  TROPONINI <0.30   No results found for this basename: CHOL, HDL, LDLCALC, TRIG   No results found for this basename: DDIMER      Radiology/Studies:  See above   Physical Exam: Blood pressure 131/85, pulse 103, temperature 98 F (36.7 C), temperature source Oral, resp. rate 19, SpO2 95.00%. General: Well developed, well nourished, in no acute distress. Head: Normocephalic, atraumatic, sclera non-icteric, no xanthomas, nares are without discharge.  Neck: Negative for carotid bruits. JVD not elevated. Lungs: Clear bilaterally to auscultation without wheezes, rales, or rhonchi. Breathing is unlabored. Heart: RRR with S1 S2. No murmurs, rubs, or gallops appreciated. Abdomen: Soft, non-tender, non-distended with normoactive bowel sounds. No  hepatomegaly. No rebound/guarding. No obvious abdominal masses. Extremities: No clubbing or cyanosis. No edema.  Distal pedal pulses are 2+ and equal bilaterally. Psych:  Responds to questions appropriately with a normal affect.    ASSESSMENT AND PLAN:  Atrial Fibrillation/ flutter   HTN  Plan   Rate controlled  Will check TSH , echo in am , Sleep study oupt  Start Heparin , keep NPO for possible Cardioversion in am    Signed, Arnoldo Lenis, A M.D  12/18/2013, 10:42 PM

## 2013-12-18 NOTE — Patient Instructions (Addendum)
Patient is to go to the emergency room at [facility] is to be called to evaluate and treat her atrial fib- cardiology doctor on- call is Dr Lauree Chandler She will take the cardiac tracing with her One of her relatives will take her to the emergency room her [place], [place] lives [place] will be called to meet them at the emergency room

## 2013-12-18 NOTE — ED Notes (Signed)
Pt. reports palpitations / tachycardia onset this afternoon with exertional dyspnea , denies chest pain / no cough or congestion , no nausea or diaphoresis.

## 2013-12-19 DIAGNOSIS — R002 Palpitations: Principal | ICD-10-CM

## 2013-12-19 DIAGNOSIS — I4891 Unspecified atrial fibrillation: Secondary | ICD-10-CM

## 2013-12-19 DIAGNOSIS — R0602 Shortness of breath: Secondary | ICD-10-CM

## 2013-12-19 DIAGNOSIS — I369 Nonrheumatic tricuspid valve disorder, unspecified: Secondary | ICD-10-CM

## 2013-12-19 DIAGNOSIS — I10 Essential (primary) hypertension: Secondary | ICD-10-CM

## 2013-12-19 LAB — BASIC METABOLIC PANEL
BUN: 19 mg/dL (ref 6–23)
CHLORIDE: 107 meq/L (ref 96–112)
CO2: 26 mEq/L (ref 19–32)
CREATININE: 1.16 mg/dL — AB (ref 0.50–1.10)
Calcium: 9.2 mg/dL (ref 8.4–10.5)
GFR calc Af Amer: 49 mL/min — ABNORMAL LOW (ref >90)
GFR calc non Af Amer: 42 mL/min — ABNORMAL LOW (ref >90)
Glucose, Bld: 113 mg/dL — ABNORMAL HIGH (ref 70–99)
Potassium: 3.8 mEq/L (ref 3.7–5.3)
Sodium: 145 mEq/L (ref 137–147)

## 2013-12-19 LAB — CBC
HCT: 38.6 % (ref 36.0–46.0)
Hemoglobin: 13.1 g/dL (ref 12.0–15.0)
MCH: 30 pg (ref 26.0–34.0)
MCHC: 33.9 g/dL (ref 30.0–36.0)
MCV: 88.5 fL (ref 78.0–100.0)
PLATELETS: 331 10*3/uL (ref 150–400)
RBC: 4.36 MIL/uL (ref 3.87–5.11)
RDW: 14.3 % (ref 11.5–15.5)
WBC: 7.4 10*3/uL (ref 4.0–10.5)

## 2013-12-19 LAB — TSH
TSH: 2.282 u[IU]/mL (ref 0.350–4.500)
TSH: 3.165 u[IU]/mL (ref 0.350–4.500)

## 2013-12-19 LAB — HEPARIN LEVEL (UNFRACTIONATED)
HEPARIN UNFRACTIONATED: 0.33 [IU]/mL (ref 0.30–0.70)
Heparin Unfractionated: <0.1 IU/mL — ABNORMAL LOW (ref 0.30–0.70)

## 2013-12-19 MED ORDER — HEPARIN (PORCINE) IN NACL 100-0.45 UNIT/ML-% IJ SOLN
1100.0000 [IU]/h | INTRAMUSCULAR | Status: DC
  Administered 2013-12-19: 1000 [IU]/h via INTRAVENOUS
  Filled 2013-12-19: qty 250

## 2013-12-19 MED ORDER — METOPROLOL TARTRATE 25 MG PO TABS: 12.5000 mg | ORAL_TABLET | Freq: Two times a day (BID) | ORAL | Status: DC

## 2013-12-19 MED ORDER — DICLOFENAC SODIUM 50 MG PO TBEC
50.0000 mg | DELAYED_RELEASE_TABLET | Freq: Two times a day (BID) | ORAL | Status: DC | PRN
  Filled 2013-12-19: qty 1

## 2013-12-19 MED ORDER — ESTRADIOL 0.05 MG/24HR TD PTWK
0.0500 mg | MEDICATED_PATCH | TRANSDERMAL | Status: DC
  Filled 2013-12-19: qty 1

## 2013-12-19 MED ORDER — PANTOPRAZOLE SODIUM 40 MG PO TBEC: 40.0000 mg | DELAYED_RELEASE_TABLET | Freq: Every day | ORAL | Status: DC

## 2013-12-19 MED ORDER — ALPRAZOLAM 0.25 MG PO TABS
0.2500 mg | ORAL_TABLET | Freq: Every day | ORAL | Status: DC | PRN
  Administered 2013-12-19: 0.25 mg via ORAL
  Filled 2013-12-19: qty 1

## 2013-12-19 MED ORDER — PROGESTERONE MICRONIZED 200 MG PO CAPS: 200.0000 mg | ORAL_CAPSULE | ORAL | Status: DC

## 2013-12-19 MED ORDER — ASPIRIN EC 325 MG PO TBEC
325.0000 mg | DELAYED_RELEASE_TABLET | Freq: Every day | ORAL | Status: DC
  Administered 2013-12-19: 325 mg via ORAL
  Filled 2013-12-19: qty 1

## 2013-12-19 MED ORDER — SODIUM CHLORIDE 0.9 % IJ SOLN: 3.0000 mL | Freq: Two times a day (BID) | INTRAMUSCULAR | Status: DC

## 2013-12-19 MED ORDER — RIVAROXABAN (XARELTO) EDUCATION KIT FOR DVT/PE PATIENTS
PACK | Freq: Once | Status: DC
  Filled 2013-12-19: qty 1

## 2013-12-19 MED ORDER — SERTRALINE HCL 50 MG PO TABS
50.0000 mg | ORAL_TABLET | Freq: Every day | ORAL | Status: DC
  Administered 2013-12-19: 50 mg via ORAL
  Filled 2013-12-19: qty 1

## 2013-12-19 MED ORDER — HEPARIN BOLUS VIA INFUSION
2000.0000 [IU] | Freq: Once | INTRAVENOUS | Status: AC
  Administered 2013-12-19: 2000 [IU] via INTRAVENOUS
  Filled 2013-12-19: qty 2000

## 2013-12-19 MED ORDER — RIVAROXABAN 15 MG PO TABS: 15.0000 mg | ORAL_TABLET | Freq: Every day | ORAL | Status: DC

## 2013-12-19 MED ORDER — OFF THE BEAT BOOK
Freq: Once | Status: DC
  Filled 2013-12-19: qty 1

## 2013-12-19 MED ORDER — RIVAROXABAN 15 MG PO TABS
15.0000 mg | ORAL_TABLET | Freq: Every day | ORAL | Status: DC
  Administered 2013-12-19: 15 mg via ORAL
  Filled 2013-12-19: qty 1

## 2013-12-19 MED ORDER — METOPROLOL TARTRATE 12.5 MG HALF TABLET
12.5000 mg | ORAL_TABLET | Freq: Two times a day (BID) | ORAL | Status: DC
  Administered 2013-12-19: 12.5 mg via ORAL
  Filled 2013-12-19 (×2): qty 1

## 2013-12-19 MED ORDER — HYDROCHLOROTHIAZIDE 12.5 MG PO CAPS
12.5000 mg | ORAL_CAPSULE | Freq: Every day | ORAL | Status: DC
  Administered 2013-12-19: 12.5 mg via ORAL
  Filled 2013-12-19: qty 1

## 2013-12-19 NOTE — Progress Notes (Signed)
  Echocardiogram 2D Echocardiogram has been performed.  Kristy [name] 12/19/2013, 4:10 PM

## 2013-12-19 NOTE — Progress Notes (Addendum)
[place] for Heparin Indication: atrial fibrillation  Allergies  Allergen Reactions  . Codeine Nausea Only  . Tramadol Nausea Only    Patient Measurements: Height: 5' 1.42" (156 cm) Weight: 171 lb 15.3 oz (78 kg) IBW/kg (Calculated) : 48.76 Heparin Dosing Weight: 70 kg  Vital Signs: Temp: 97.8 F (36.6 C) (01/13 0510) Temp src: Oral (01/13 0510) BP: 138/52 mmHg (01/13 0510) Pulse Rate: 82 (01/13 0510)  Labs:  Recent Labs  12/18/13 2123 12/19/13 0200 12/19/13 0950  HGB 13.4  --  13.1  HCT 39.7  --  38.6  PLT 352  --  331  HEPARINUNFRC  --  <0.10* 0.33  CREATININE 1.18* 1.16*  --   TROPONINI <0.30  --   --     Estimated Creatinine Clearance: 35.1 ml/min (by C-G formula based on Cr of 1.16).   Assessment: 80 yo female with Afib continues on heparin.  Heparin level therapeutic at 0.33  Goal of Therapy:  Heparin level 0.3-0.7 units/ml Monitor platelets by anticoagulation protocol: Yes   Plan:  Increase heparin to 1100 units / hr Follow-up am labs.  Thank you. Anette Guarneri, PharmD [phone] 12/19/2013,11:13 AM  PM: Heparin changed to Xarelto Dose is 15 mg daily for CrCl of 35  Thank you. Anette Guarneri, PharmD

## 2013-12-19 NOTE — Discharge Summary (Signed)
Physician Discharge Summary  Patient ID: Kristy [name] MRN: [id number] DOB/AGE: Apr 24, 1928 80 y.o.  Admit date: 12/18/2013 Discharge date: 12/19/2013  Admission Diagnoses:  Atrial fib with RVR  Discharge Diagnoses:  Principal Problem:   PAF (paroxysmal atrial fibrillation) Active Problems:   OBESITY   HYPERTENSION   Discharged Condition: stable  Hospital Course:   Kristy patient is an 80 yo female with a history of HTN, Obesity, GERD, fatty liver, obesity who states that she was having lightheadedness/ near syncope symptoms earlier in Kristy day that prompted her to sit down. She checked her BP which was stable , however HR was in Kristy 140's . She was subsequently seen in Kristy PCP office who did an EKG that demonstrated Afib. Apparently Dr Percival Spanish office was contacted and Kristy [name] was asked to come into Kristy ER. Over here Kristy [name] denies any symptoms currently . [name] denies any chest pain , orthopnea, PND , LE edema , Syncope ,claudcation , focal weakness, or bleeding diathesis . Has chronic DOE . States that she may have OSA but has not undergone sleep study.  [name] had Echo in 2011 that had grade II Diastiloc dysfunction , apparently a stress test was also done by Dr Warren Lacy per [name] (study was not located in [facility] in his notes or under imaging tab)  EKG in ER shows Atrial flutter with 2;1 conduction, Inferior Q waves, PRWP.  Kristy patient was admitted and started on IV heparin.  She spontaneously converted to NSR.  She was started on low dose lopressor for better rate control.  She was started on Xarelto as she has a CHADSVASC score of 4 and preferred a newer agent.  No history of GIB. 2D echo revealed EF of 60-65% moderate to sev pulm HTN-57mmHg, Mod TR.  Kristy patient was seen by Dr. Meda Coffee who felt she was stable for DC home.  Follow up arranged.   Consults: None  Significant Diagnostic Studies:  Echo Study Conclusions  - Left ventricle: Kristy cavity size was normal. Wall thickness was normal. Systolic  function was vigorous. Kristy estimated ejection fraction was in Kristy range of 65% to 70%. Wall motion was normal; there were no regional wall motion abnormalities. - Tricuspid valve: Moderate regurgitation. - Pulmonary arteries: Systolic pressure was moderately to severely increased. PA peak pressure: 3mm Hg (S). - Pericardium, extracardiac: A trivial pericardial effusion was identified.  Treatments: See above  Discharge Exam: Blood pressure 123/73, pulse 76, temperature 97.9 F (36.6 C), temperature source Oral, resp. rate 18, height 5' 1.42" (1.56 m), weight 171 lb 15.3 oz (78 kg), SpO2 94.00%.   Disposition:   Discharge Orders   Future Appointments Provider Department Dept Phone   01/01/2014 9:30 AM Burtis Junes, NP [facility] [phone]   Future Orders Complete By Expires   Diet - low sodium heart healthy  As directed        Medication List         ALPRAZolam 0.25 MG tablet  Commonly known as:  XANAX  Take 0.25 mg by mouth daily as needed for anxiety.     aspirin EC 325 MG tablet  Take 325 mg by mouth daily.     diclofenac 50 MG EC tablet  Commonly known as:  VOLTAREN  Take 50 mg by mouth 2 (two) times daily as needed for mild pain.     estradiol 0.025 MG/24HR  Commonly known as:  VIVELLE-DOT  Place 1 patch onto Kristy skin every Sunday.  hydrochlorothiazide 12.5 MG capsule  Commonly known as:  MICROZIDE  Take 12.5 mg by mouth daily.     metoprolol tartrate 25 MG tablet  Commonly known as:  LOPRESSOR  Take 0.5 tablets (12.5 mg total) by mouth 2 (two) times daily.     multivitamin tablet  Take 1 tablet by mouth daily.     omeprazole 20 MG capsule  Commonly known as:  PRILOSEC  Take 20 mg by mouth every morning.     omeprazole 20 MG capsule  Commonly known as:  PRILOSEC  Take 20 mg by mouth daily as needed (as needed for heartburn in Kristy evening).     progesterone 200 MG capsule  Commonly known as:  PROMETRIUM  Take 200 mg by  mouth See admin instructions. 12 tabs every four months     Rivaroxaban 15 MG Tabs tablet  Commonly known as:  XARELTO  Take 1 tablet (15 mg total) by mouth daily.     sertraline 50 MG tablet  Commonly known as:  ZOLOFT  Take 50 mg by mouth daily.     SYSTANE OP  Apply 1-2 drops to eye 4 (four) times daily as needed (dry eyes).     trimethoprim 100 MG tablet  Commonly known as:  TRIMPEX  Take 100 mg by mouth daily as needed (for 3 days as needed for urinary tract infection).     Vitamin D3 2000 UNITS Tabs  Take by mouth daily.           Follow-up Information   Follow up with Truitt Merle, NP On 01/01/2014. (9:30 AM)    Specialty:  Nurse Practitioner   Contact information:   [address] [place] [address] [phone]       Signed: Tarri Fuller 12/19/2013, 5:14 PM

## 2013-12-19 NOTE — Discharge Instructions (Signed)
Information on my medicine - XARELTO (Rivaroxaban)  This medication education was reviewed with me or my healthcare representative as part of my discharge preparation.  Why was Xarelto prescribed for you? Xarelto was prescribed for you to reduce the risk of a blood clots forming after orthopedic surgery OR to reduce the risk of forming blood clots that cause a stroke if you have a medical condition called atrial fibrillation (a type of irregular heartbeat).  What do you need to know about xarelto ? Take your Xarelto ONCE DAILY at the same time every day with your evening meal. If you have difficulty swallowing the tablet whole, you may crush it and mix in applesauce just prior to taking your dose.  Take Xarelto exactly as prescribed by your doctor and DO NOT stop taking Xarelto without talking to the doctor who prescribed the medication.  Stopping without other stroke or VTE prevention medication to take the place of Xarelto may increase your risk of developing a new clot or stroke.  Refill your prescription before you run out.  After discharge, you should have regular check-up appointments with your healthcare provider that is prescribing your Xarelto.  In the future your dose may need to be changed if your kidney function or weight changes by a significant amount.  What do you do if you miss a dose? If you are taking Xarelto ONCE DAILY and you miss a dose, take it as soon as you remember on the same day then continue your regularly scheduled once daily regimen the next day. Do not take two doses of Xarelto at the same time.   Important Safety Information A possible side effect of Xarelto is bleeding. You should call your healthcare provider right away if you experience any of the following:   Bleeding from an injury or your nose that does not stop.   Unusual colored urine (red or dark brown) or unusual colored stools (red or black).   Unusual bruising for unknown reasons.   A  serious fall or if you hit your head (even if there is no bleeding).  Some medicines may interact with Xarelto and might increase your risk of bleeding while on Xarelto. To help avoid this, consult your healthcare provider or pharmacist prior to using any new prescription or non-prescription medications, including herbals, vitamins, non-steroidal anti-inflammatory drugs (NSAIDs) and supplements.  This website has more information on Xarelto: [email or web address]

## 2013-12-19 NOTE — Progress Notes (Signed)
ANTICOAGULATION CONSULT NOTE - Initial Consult  Pharmacy Consult for Heparin Indication: atrial fibrillation  Allergies  Allergen Reactions  . Codeine Nausea Only  . Tramadol Nausea Only    Patient Measurements: Height: 5' 1.42" (156 cm) Weight: 171 lb 15.3 oz (78 kg) IBW/kg (Calculated) : 48.76 Heparin Dosing Weight: 70 kg  Vital Signs: Temp: 97.7 F (36.5 C) (01/13 0045) Temp src: Oral (01/12 2014) BP: 137/64 mmHg (01/13 0045) Pulse Rate: 71 (01/13 0000)  Labs:  Recent Labs  12/18/13 2123  HGB 13.4  HCT 39.7  PLT 352  CREATININE 1.18*  TROPONINI <0.30    Estimated Creatinine Clearance: 34.5 ml/min (by C-G formula based on Cr of 1.18).   Medical History: Past Medical History  Diagnosis Date  . Hypertension   . Obesity   . Colon polyps   . Endometrial polyp   . SOB (shortness of breath)   . Diverticulosis   . GERD (gastroesophageal reflux disease)   . Hiatal hernia   . Endometrial hyperplasia without atypia, simple   . Hyperplastic colon polyp   . Lipoma   . Fatty liver     Medications:  Prescriptions prior to admission  Medication Sig Dispense Refill  . ALPRAZolam (XANAX) 0.25 MG tablet Take 0.25 mg by mouth daily as needed for anxiety.      Marland Kitchen aspirin EC 325 MG tablet Take 325 mg by mouth daily.      . Cholecalciferol (VITAMIN D3) 2000 UNITS TABS Take by mouth daily.      . diclofenac (VOLTAREN) 50 MG EC tablet Take 50 mg by mouth 2 (two) times daily as needed for mild pain.      Marland Kitchen estradiol (VIVELLE-DOT) 0.025 MG/24HR Place 1 patch onto the skin every Sunday.       . hydrochlorothiazide (MICROZIDE) 12.5 MG capsule Take 12.5 mg by mouth daily.      . Multiple Vitamin (MULTIVITAMIN) tablet Take 1 tablet by mouth daily.        Marland Kitchen omeprazole (PRILOSEC) 20 MG capsule Take 20 mg by mouth every morning.      Marland Kitchen omeprazole (PRILOSEC) 20 MG capsule Take 20 mg by mouth daily as needed (as needed for heartburn in the evening).      Vladimir Faster Glycol-Propyl  Glycol (SYSTANE OP) Apply 1-2 drops to eye 4 (four) times daily as needed (dry eyes).      . progesterone (PROMETRIUM) 200 MG capsule Take 200 mg by mouth See admin instructions. 12 tabs every four months      . sertraline (ZOLOFT) 50 MG tablet Take 50 mg by mouth daily.      Marland Kitchen trimethoprim (TRIMPEX) 100 MG tablet Take 100 mg by mouth daily as needed (for 3 days as needed for urinary tract infection).         Assessment: 80 yo female with Afib for heparin  Goal of Therapy:  Heparin level 0.3-0.7 units/ml Monitor platelets by anticoagulation protocol: Yes   Plan:  Heparin 2000 units IV bolus, then 1000 units/hr Follow-up am labs.  Caryl Pina 12/19/2013,1:08 AM

## 2013-12-19 NOTE — Progress Notes (Addendum)
Subjective: No complaints.  She has a hairline fracture in her right wrist after trying to catch her husband who was falling.   Objective: Vital signs in last 24 hours: Temp:  [97 F (36.1 C)-98.2 F (36.8 C)] 97.8 F (36.6 C) (01/13 0510) Pulse Rate:  [71-133] 82 (01/13 0510) Resp:  [13-24] 18 (01/13 0510) BP: (103-138)/(39-99) 138/52 mmHg (01/13 0510) SpO2:  [94 %-98 %] 96 % (01/13 0510) Weight:  [171 lb 12.8 oz (77.928 kg)-171 lb 15.3 oz (78 kg)] 171 lb 15.3 oz (78 kg) (01/13 0100) Last BM Date: 12/18/13 (per report)  Intake/Output from previous day:   Intake/Output this shift:    Medications Current Facility-Administered Medications  Medication Dose Route Frequency Provider Last Rate Last Dose  . ALPRAZolam (XANAX) tablet 0.25 mg  0.25 mg Oral Daily PRN Kristy Folk, MD   0.25 mg at 12/19/13 1045  . aspirin EC tablet 325 mg  325 mg Oral Daily Kristy Folk, MD      . diclofenac (VOLTAREN) EC tablet 50 mg  50 mg Oral BID PRN Kristy Folk, MD      . estradiol (CLIMARA - Dosed in mg/24 hr) patch 0.05 mg  0.05 mg Transdermal Weekly Kristy A Yousuf, MD      . heparin ADULT infusion 100 units/mL (25000 units/250 mL)  1,100 Units/hr Intravenous Continuous Kristy Breeding, MD 11 mL/hr at 12/19/13 1112 1,100 Units/hr at 12/19/13 1112  . hydrochlorothiazide (MICROZIDE) capsule 12.5 mg  12.5 mg Oral Daily Kristy Folk, MD   12.5 mg at 12/19/13 1045  . off the beat book   Does not apply Once Kristy Folk, MD      . pantoprazole (PROTONIX) EC tablet 40 mg  40 mg Oral Daily Kristy Folk, MD      . sertraline (ZOLOFT) tablet 50 mg  50 mg Oral Daily Kristy A Yousuf, MD      . sodium chloride 0.9 % injection 3 mL  3 mL Intravenous Q12H Kristy Folk, MD       PE: General appearance: alert, cooperative and no distress Lungs: clear to auscultation bilaterally Heart: regular rate and rhythm, S1, S2 normal, no murmur, click, rub or gallop Extremities: No LEE Pulses: 2+ and symmetric Skin:  Warm and dry Neurologic: Grossly normal  Lab Results:   Recent Labs  12/18/13 2123 12/19/13 0950  WBC 10.2 7.4  HGB 13.4 13.1  HCT 39.7 38.6  PLT 352 331   BMET  Recent Labs  12/18/13 2123 12/19/13 0200  NA 142 145  K 3.9 3.8  CL 104 107  CO2 24 26  GLUCOSE 113* 113*  BUN 19 19  CREATININE 1.18* 1.16*  CALCIUM 9.4 9.2    Assessment/Plan  Principal Problem:   PAF (paroxysmal atrial fibrillation) Active Problems:   OBESITY   HYPERTENSION  Plan:  Currently maintaining NSR in the 80's.  ASA, heparin, HCTZ.  CHADSVASC of 4. No history of GIB.  She would prefer newer anticoagulation agent.  Will add a low dose lopressor for rate control.   2D echo pending.  BP controlled.     LOS: 1 day    Kristy [name] 12/19/2013 11:37 AM  The patient was seen, examined and discussed with Kristy Fuller, PA-C and I agree with the above.   A very pleasant 80 year old female with known chronic CHF with preserved LVEF (grade 2 diastolic dysfunction), hypertension followed by Dr Kristy [name] in the clinic. She presented with  an acute onset of palpitations and was found to be in atrial fibrillation with RVR (possible atrial flutter with variable block on 1 ECG). She cardioverted spontaneously after few hours. She currently feels better and appears euvolemic. We will start Xarelto and low doe metoprolol 12.5 mg po bid. Her echo is pending. The last echocardiogram from 2011 showed grade 2 diastolic dysfunction and normal left  atrial size. If nothing concerning on the chocardiogram we will dischrge today with an early follow up with Dr Kristy [name] at the outpatient clinic.  Xarelto can possibly be discontinued if remains in SR.  CHADS-VASc is 5.  Kristy [name], Kristy [name] 12/19/2013

## 2013-12-20 ENCOUNTER — Telehealth: Payer: Self-pay | Admitting: Cardiology

## 2013-12-20 NOTE — Telephone Encounter (Signed)
Needs to stop the ASA.

## 2013-12-20 NOTE — Telephone Encounter (Signed)
Returned call to patient Dr.Hochrein advised stop aspirin.

## 2013-12-20 NOTE — Discharge Summary (Signed)
Kristy [name], Kristy [name] 12/20/2013

## 2013-12-20 NOTE — Telephone Encounter (Signed)
Pt called to check  if it was okay with Dr. Percival Spanish for her to takes Aspirin 325 mg once a day with Xarelto 15 mg once a day. Xarelto was prescribed in the hospital 12/18/13. The  MD's in the hospital said that it was fine for her to continued taking the aspirin also, but pt wants to make sure.

## 2013-12-20 NOTE — Telephone Encounter (Signed)
New Message  Pt called.. Requests a call back to discuss if it is OK to take Xarelto with Asprin.Marland Kitchen Please call.

## 2013-12-29 ENCOUNTER — Telehealth: Payer: Self-pay

## 2013-12-29 NOTE — Telephone Encounter (Signed)
Patient called about PA. I called the PA and it was approved from 12/29/13- 12/29/14 Pa 3 [id number] ID# [id number]

## 2014-01-01 ENCOUNTER — Encounter: Payer: Medicare Other | Admitting: Nurse Practitioner

## 2014-01-16 ENCOUNTER — Other Ambulatory Visit: Payer: Self-pay | Admitting: Family Medicine

## 2014-01-24 ENCOUNTER — Encounter: Payer: Medicare Other | Admitting: Cardiology

## 2014-02-12 ENCOUNTER — Ambulatory Visit (INDEPENDENT_AMBULATORY_CARE_PROVIDER_SITE_OTHER): Payer: Medicare Other | Admitting: Pharmacist

## 2014-02-12 ENCOUNTER — Encounter: Payer: Self-pay | Admitting: Pharmacist

## 2014-02-12 VITALS — BP 131/61 | HR 72

## 2014-02-12 DIAGNOSIS — Z79899 Other long term (current) drug therapy: Secondary | ICD-10-CM

## 2014-02-12 DIAGNOSIS — I4891 Unspecified atrial fibrillation: Secondary | ICD-10-CM

## 2014-02-12 NOTE — Progress Notes (Signed)
CC:  Medication questions  HPI:  Kristy [name] was diagnosed with atrial fibrillation about 3 months ago and started Xarelto.  She has done well with Xarelto but has some concerns about possible drug-drug interactions.  In particular she has been taking diclofenac 50mg  1 tablet as needed for wrist pain due to fractured wrist last year.  She reports only taking diclofenac about 3 times per week.  She also takes APAP 325mg  as needed for pain and noticed on the bottle that it states not to take with warfarin and wants to know if it is ok to take with Xarelto. Mrs. Galligan also reports that she recently ordered ginsing and would like to start but wanted to check if OK.  Lastly the patient has been taking estrogen replacement (currently estradiol 0.025mg  patched and prometrium).  She has tried to taper off in past but has horrible hot flashes and felt terrible.   Filed Vitals:   02/12/14 1422  BP: 131/61  Pulse: 72   Assessment: A-Fib Medication Assessment Estrogen Replacement / Post Menopausal  Plan: Reviewed patient's medications  Discussed the increased risk of bleeding with Xarelto and APAP, Diclofenac and Ginsing.  I explained the s/s of bleeding to monitor for and to call office if she notices dark tarry stools, rectal or vaginal bleeding, hematuria or unusual bleeding or bruising.  She can continue APAP as needed for pain and Diclofenac (not more than 3 times per week).  I recommended not to start ginsing - I feel the risk of bleeding is higher than the benefit she might get from ginsing.  Patient to continue estradiol patch and prometrium but will try to extend period of wear to 8-10 days and possibly discontinue in future.  Monitor CBC every 3-6 months   Cherre Robins, PharmD, CPP

## 2014-02-28 ENCOUNTER — Other Ambulatory Visit: Payer: Self-pay

## 2014-02-28 DIAGNOSIS — L989 Disorder of the skin and subcutaneous tissue, unspecified: Secondary | ICD-10-CM

## 2014-03-02 ENCOUNTER — Ambulatory Visit: Payer: Medicare Other | Admitting: Physician Assistant

## 2014-03-21 ENCOUNTER — Ambulatory Visit (INDEPENDENT_AMBULATORY_CARE_PROVIDER_SITE_OTHER): Payer: Medicare Other | Admitting: Cardiology

## 2014-03-21 ENCOUNTER — Encounter: Payer: Self-pay | Admitting: Cardiology

## 2014-03-21 VITALS — BP 140/80 | HR 67 | Ht 62.0 in | Wt 172.0 lb

## 2014-03-21 DIAGNOSIS — I4891 Unspecified atrial fibrillation: Secondary | ICD-10-CM

## 2014-03-21 DIAGNOSIS — I10 Essential (primary) hypertension: Secondary | ICD-10-CM

## 2014-03-21 DIAGNOSIS — I480 Paroxysmal atrial fibrillation: Secondary | ICD-10-CM

## 2014-03-21 MED ORDER — METOPROLOL SUCCINATE ER 25 MG PO TB24: 25.0000 mg | ORAL_TABLET | Freq: Every day | ORAL | Status: DC

## 2014-03-21 NOTE — Patient Instructions (Addendum)
Please stop your Lopressor twice a day and start Metoprolol succinate 25 mg daily. Continue all other medications as listed.  Follow up in 4 months with Dr Percival Spanish.  You will receive a letter in the mail 2 months before you are due.  Please call us when you receive this letter to schedule your follow up appointment.

## 2014-03-21 NOTE — Progress Notes (Signed)
HPI Patient presents for follow up of atrial fib.  She was hospitalized since I last saw her with this. This converted spontaneously. She had been managed with Xarelto.  She has had no further symptomatic recurrence of this. He denies any chest pressure, neck or arm discomfort (except where she broke her arm.)  She has no PND or orthopnea.  She did have some pulmonary HTN and TR on echo.  [facility] records reviewed).    Allergies  Allergen Reactions  . Codeine Nausea Only  . Tramadol Nausea Only    Current Outpatient Prescriptions  Medication Sig Dispense Refill  . ALPRAZolam (XANAX) 0.25 MG tablet Take 0.25 mg by mouth daily as needed for anxiety.      . Cholecalciferol (VITAMIN D3) 2000 UNITS TABS Take by mouth daily.      . diclofenac (VOLTAREN) 50 MG EC tablet Take 50 mg by mouth daily.       Marland Kitchen estradiol (VIVELLE-DOT) 0.025 MG/24HR Place 1 patch onto the skin every Sunday.       . hydrochlorothiazide (MICROZIDE) 12.5 MG capsule Take 12.5 mg by mouth daily.      . metoprolol tartrate (LOPRESSOR) 25 MG tablet Take 0.5 tablets (12.5 mg total) by mouth 2 (two) times daily.  30 tablet  5  . Multiple Vitamin (MULTIVITAMIN) tablet Take 1 tablet by mouth daily.        Marland Kitchen omeprazole (PRILOSEC) 20 MG capsule Take 20 mg by mouth daily as needed (as needed for heartburn in the evening).      Vladimir Faster Glycol-Propyl Glycol (SYSTANE OP) Apply 1-2 drops to eye 4 (four) times daily as needed (dry eyes).      . progesterone (PROMETRIUM) 200 MG capsule Take 200 mg by mouth See admin instructions. 12 tabs every four months      . Rivaroxaban (XARELTO) 15 MG TABS tablet Take 1 tablet (15 mg total) by mouth daily.  30 tablet  11  . sertraline (ZOLOFT) 50 MG tablet Take 50 mg by mouth daily.      . traMADol (ULTRAM) 50 MG tablet Take 50 mg by mouth as needed.      . ONE TOUCH ULTRA TEST test strip USE TO TEST SUGAR TWICE DAILY  100 each  6  . trimethoprim (TRIMPEX) 100 MG tablet Take 100 mg by mouth  daily as needed (for 3 days as needed for urinary tract infection).        No current facility-administered medications for this visit.    Past Medical History  Diagnosis Date  . Hypertension   . Obesity   . Colon polyps   . Endometrial polyp   . SOB (shortness of breath)   . Diverticulosis   . GERD (gastroesophageal reflux disease)   . Hiatal hernia   . Endometrial hyperplasia without atypia, simple   . Hyperplastic colon polyp   . Lipoma   . Fatty liver     Past Surgical History  Procedure Laterality Date  . Cholecystectomy  03/2002  . Umbilical hernia repair  09/2002  . Knee surgery    . Optic neuritis    . Dilation and curettage of uterus      x 2  . Cataract extraction    . Breast biopsy    . Hernia repair      ROS:  As stated in the HPI and negative for all other systems.  PHYSICAL EXAM BP 140/80  Pulse 67  Ht 5\' 2"  (1.575 m)  Wt  172 lb (78.019 kg)  BMI 31.45 kg/m2 GENERAL:  Well appearing NECK:  No jugular venous distention, waveform within normal limits, carotid upstroke brisk and symmetric, no bruits, no thyromegaly LUNGS:  Clear to auscultation bilaterally CHEST:  Unremarkable HEART:  PMI not displaced or sustained,S1 and S2 within normal limits, no S3, no S4, no clicks, no rubs, no murmurs ABD:  Flat, positive bowel sounds normal in frequency in pitch, no bruits, no rebound, no guarding, no midline pulsatile mass, no hepatomegaly, no splenomegaly EXT:  2 plus pulses throughout, no edema, no cyanosis no clubbing   EKG:  Sinus rhythm, rate 66 left axis deviation, left anterior fascicular block, poor anterior R wave progression, first degree AV block, no acute ST-T wave changes.  03/21/2014   ASSESSMENT AND PLAN  ATRIAL FIB:  She has had no recurrence of this. She wants to take sustained release metoprolol and change this but otherwise her meds will be unchanged.  HTN:  The blood pressure is at target. No change in medications is indicated. We will  continue with therapeutic lifestyle changes (TLC).  DYSPNEA:  This may be slowly progressive. I do note TR and some pulmonary hypertension on her echo. She has a large hiatal hernia which could be contributing to dyspnea. I don't think she is volume overloaded.  I will follow with repeat echo his dyspnea worsens in the future she might need right heart catheterization.

## 2014-03-22 ENCOUNTER — Other Ambulatory Visit: Payer: Self-pay | Admitting: *Deleted

## 2014-03-22 MED ORDER — HYDROCHLOROTHIAZIDE 12.5 MG PO TABS: 12.5000 mg | ORAL_TABLET | Freq: Every day | ORAL | Status: DC

## 2014-04-19 ENCOUNTER — Telehealth: Payer: Self-pay | Admitting: Cardiology

## 2014-04-19 ENCOUNTER — Other Ambulatory Visit: Payer: Medicare Other

## 2014-04-19 DIAGNOSIS — Z79899 Other long term (current) drug therapy: Secondary | ICD-10-CM

## 2014-04-19 DIAGNOSIS — I4891 Unspecified atrial fibrillation: Secondary | ICD-10-CM

## 2014-04-19 NOTE — Telephone Encounter (Signed)
New message    Need lab order put into the system.

## 2014-05-07 ENCOUNTER — Other Ambulatory Visit (INDEPENDENT_AMBULATORY_CARE_PROVIDER_SITE_OTHER): Payer: Medicare Other

## 2014-05-07 DIAGNOSIS — Z79899 Other long term (current) drug therapy: Secondary | ICD-10-CM

## 2014-05-07 DIAGNOSIS — K219 Gastro-esophageal reflux disease without esophagitis: Secondary | ICD-10-CM

## 2014-05-07 NOTE — Progress Notes (Signed)
Pt came in for lab  only 

## 2014-05-08 LAB — CBC WITH DIFFERENTIAL
BASOS: 1 %
Basophils Absolute: 0 10*3/uL (ref 0.0–0.2)
Eos: 1 %
Eosinophils Absolute: 0.1 10*3/uL (ref 0.0–0.4)
HCT: 40 % (ref 34.0–46.6)
HEMOGLOBIN: 13 g/dL (ref 11.1–15.9)
IMMATURE GRANS (ABS): 0 10*3/uL (ref 0.0–0.1)
Immature Granulocytes: 0 %
LYMPHS: 29 %
Lymphocytes Absolute: 2 10*3/uL (ref 0.7–3.1)
MCH: 29.6 pg (ref 26.6–33.0)
MCHC: 32.5 g/dL (ref 31.5–35.7)
MCV: 91 fL (ref 79–97)
MONOCYTES: 5 %
Monocytes Absolute: 0.4 10*3/uL (ref 0.1–0.9)
Neutrophils Absolute: 4.6 10*3/uL (ref 1.4–7.0)
Neutrophils Relative %: 64 %
Platelets: 406 10*3/uL — ABNORMAL HIGH (ref 150–379)
RBC: 4.39 x10E6/uL (ref 3.77–5.28)
RDW: 14.1 % (ref 12.3–15.4)
WBC: 7.1 10*3/uL (ref 3.4–10.8)

## 2014-06-29 ENCOUNTER — Other Ambulatory Visit: Payer: Self-pay | Admitting: *Deleted

## 2014-08-22 ENCOUNTER — Ambulatory Visit: Payer: Medicare Other | Admitting: Cardiology

## 2014-08-22 ENCOUNTER — Ambulatory Visit (INDEPENDENT_AMBULATORY_CARE_PROVIDER_SITE_OTHER): Payer: Medicare Other | Admitting: Cardiology

## 2014-08-22 ENCOUNTER — Encounter: Payer: Self-pay | Admitting: Cardiology

## 2014-08-22 VITALS — BP 136/74 | HR 83 | Ht 62.0 in | Wt 171.0 lb

## 2014-08-22 DIAGNOSIS — Z79899 Other long term (current) drug therapy: Secondary | ICD-10-CM

## 2014-08-22 DIAGNOSIS — I4891 Unspecified atrial fibrillation: Secondary | ICD-10-CM

## 2014-08-22 DIAGNOSIS — I480 Paroxysmal atrial fibrillation: Secondary | ICD-10-CM

## 2014-08-22 NOTE — Patient Instructions (Signed)
The current medical regimen is effective;  continue present plan and medications.  Please have blood work at [facility] (CBC).  Follow up in 6 months with Dr. Percival Spanish.  You will receive a letter in the mail 2 months before you are due.  Please call us when you receive this letter to schedule your follow up appointment.

## 2014-08-22 NOTE — Progress Notes (Signed)
HPI Patient presents for follow up of atrial fib.   She had been managed with Xarelto.  She has had no further symptomatic recurrence of this. He denies any chest pressure, neck or arm discomfort. She has no PND or orthopnea.  She did have some pulmonary HTN and TR on echo.   Unfortunately her 78 year old sister who is also my patient was admitted to the hospital today with an intracranial bleed. She is not expected to recover from this. The patient herself continues to have the same shortness of breath that she's been having. However, is not any worse than previous. There's not any PND or orthopnea. There's not any chest pressure, neck or arm discomfort. She is naturally upset about her sister.   Allergies  Allergen Reactions  . Codeine Nausea Only  . Tramadol Nausea Only    Current Outpatient Prescriptions  Medication Sig Dispense Refill  . ALPRAZolam (XANAX) 0.25 MG tablet Take 0.25 mg by mouth daily as needed for anxiety.      . Cholecalciferol (VITAMIN D3) 2000 UNITS TABS Take by mouth daily.      . diclofenac (VOLTAREN) 50 MG EC tablet Take 50 mg by mouth as needed.       Marland Kitchen estradiol (VIVELLE-DOT) 0.025 MG/24HR Place 1 patch onto the skin every Sunday.       . hydrochlorothiazide (HYDRODIURIL) 12.5 MG tablet Take 1 tablet (12.5 mg total) by mouth daily.  90 tablet  1  . metoprolol succinate (TOPROL XL) 25 MG 24 hr tablet Take 1 tablet (25 mg total) by mouth daily.  90 tablet  3  . Multiple Vitamin (MULTIVITAMIN) tablet Take 1 tablet by mouth daily.        Marland Kitchen omeprazole (PRILOSEC) 20 MG capsule Take 20 mg by mouth daily as needed (as needed for heartburn in the evening).      Vladimir Faster Glycol-Propyl Glycol (SYSTANE OP) Apply 1-2 drops to eye 4 (four) times daily as needed (dry eyes).      . progesterone (PROMETRIUM) 200 MG capsule Take 200 mg by mouth See admin instructions. 12 tabs every four months      . Rivaroxaban (XARELTO) 15 MG TABS tablet Take 1 tablet (15 mg total) by mouth  daily.  30 tablet  11  . sertraline (ZOLOFT) 50 MG tablet Take 50 mg by mouth daily.      Marland Kitchen trimethoprim (TRIMPEX) 100 MG tablet Take 100 mg by mouth daily as needed (for 3 days as needed for urinary tract infection).       . ONE TOUCH ULTRA TEST test strip USE TO TEST SUGAR TWICE DAILY  100 each  6   No current facility-administered medications for this visit.    Past Medical History  Diagnosis Date  . Hypertension   . Obesity   . Colon polyps   . Endometrial polyp   . SOB (shortness of breath)   . Diverticulosis   . GERD (gastroesophageal reflux disease)   . Hiatal hernia   . Endometrial hyperplasia without atypia, simple   . Hyperplastic colon polyp   . Lipoma   . Fatty liver     Past Surgical History  Procedure Laterality Date  . Cholecystectomy  03/2002  . Umbilical hernia repair  09/2002  . Knee surgery    . Optic neuritis    . Dilation and curettage of uterus      x 2  . Cataract extraction    . Breast biopsy    .  Hernia repair      ROS:  As stated in the HPI and negative for all other systems.  PHYSICAL EXAM BP 136/74  Pulse 83  Ht 5\' 2"  (1.575 m)  Wt 171 lb (77.565 kg)  BMI 31.27 kg/m2 GENERAL:  Well appearing NECK:  No jugular venous distention, waveform within normal limits, carotid upstroke brisk and symmetric, no bruits, no thyromegaly LUNGS:  Clear to auscultation bilaterally CHEST:  Unremarkable HEART:  PMI not displaced or sustained,S1 and S2 within normal limits, no S3, no S4, no clicks, no rubs, no murmurs ABD:  Flat, positive bowel sounds normal in frequency in pitch, no bruits, no rebound, no guarding, no midline pulsatile mass, no hepatomegaly, no splenomegaly EXT:  2 plus pulses throughout, no edema, no cyanosis no clubbing   EKG:  Sinus rhythm, rate 71 left axis deviation, left anterior fascicular block, poor anterior R wave progression, first degree AV block, no acute ST-T wave changes.  08/22/2014   ASSESSMENT AND PLAN  ATRIAL FIB:   She has had no recurrence of this. Given the occurrence with her sister we again had a long discussion about the risks benefits of anticoagulation.  Ms. MAURIE MUSCO has a CHA2DS2 - VASc score of 5 with a risk of stroke of 6.7%  and a HAS - BLED score of 1 with a low risk of bleeding.  Given this the risk benefits clearly fallen beside continuing anticoagulant.  However, her creatinine clearance is slightly reduced with her mildly increased creatinine. Therefore, she did have her dose reduced to 15 mg.  HTN:  The blood pressure is at target. No change in medications is indicated. We will continue with therapeutic lifestyle changes (TLC).  DYSPNEA:  This may be slowly progressive. I do note TR and some pulmonary hypertension on her echo and I reviewed this with her again today.. She has a large hiatal hernia which could be contributing to dyspnea. I don't think she is volume overloaded.  I will follow with repeat echo his dyspnea worsens in the future she might need right heart catheterization.  However, seems to be stable. We did talk about salt restriction.

## 2014-08-24 ENCOUNTER — Other Ambulatory Visit: Payer: Medicare Other

## 2014-08-24 DIAGNOSIS — Z1212 Encounter for screening for malignant neoplasm of rectum: Secondary | ICD-10-CM

## 2014-08-24 NOTE — Progress Notes (Signed)
Lab only 

## 2014-08-26 LAB — FECAL OCCULT BLOOD, IMMUNOCHEMICAL: FECAL OCCULT BLD: NEGATIVE

## 2014-08-29 ENCOUNTER — Encounter: Payer: Self-pay | Admitting: *Deleted

## 2014-08-29 ENCOUNTER — Ambulatory Visit: Payer: Medicare Other | Admitting: Family Medicine

## 2014-08-31 ENCOUNTER — Telehealth: Payer: Self-pay | Admitting: Pharmacist

## 2014-08-31 DIAGNOSIS — Z79899 Other long term (current) drug therapy: Secondary | ICD-10-CM

## 2014-08-31 MED ORDER — DICLOFENAC SODIUM 1 % TD GEL: 2.0000 g | Freq: Four times a day (QID) | TRANSDERMAL | Status: DC | PRN

## 2014-08-31 NOTE — Telephone Encounter (Signed)
Received report from Leim Fabry, PharmD with [facility] regarding Xarelto and NSAID use.  Patient has stopped using voltaren / diclofenac po but would like to try volteran Gel for neck pain.  Rx sent to [facility].  Also discussed need to recheck BMET.  Patient is planning to come in next week to have CBC check and will put order in also for BMET.

## 2014-09-04 ENCOUNTER — Other Ambulatory Visit (INDEPENDENT_AMBULATORY_CARE_PROVIDER_SITE_OTHER): Payer: Medicare Other

## 2014-09-04 DIAGNOSIS — Z79899 Other long term (current) drug therapy: Secondary | ICD-10-CM

## 2014-09-04 LAB — POCT CBC
Granulocyte percent: 65.8 %G (ref 37–80)
HCT, POC: 39.7 % (ref 37.7–47.9)
Hemoglobin: 13 g/dL (ref 12.2–16.2)
LYMPH, POC: 2.5 (ref 0.6–3.4)
MCH: 28.8 pg (ref 27–31.2)
MCHC: 32.8 g/dL (ref 31.8–35.4)
MCV: 87.8 fL (ref 80–97)
MPV: 8 fL (ref 0–99.8)
POC Granulocyte: 5.5 (ref 2–6.9)
POC LYMPH %: 29.3 % (ref 10–50)
Platelet Count, POC: 396 10*3/uL (ref 142–424)
RBC: 4.5 M/uL (ref 4.04–5.48)
RDW, POC: 14.3 %
WBC: 8.4 10*3/uL (ref 4.6–10.2)

## 2014-09-05 LAB — BMP8+EGFR
BUN/Creatinine Ratio: 14 (ref 11–26)
BUN: 22 mg/dL (ref 8–27)
CHLORIDE: 100 mmol/L (ref 97–108)
CO2: 25 mmol/L (ref 18–29)
Calcium: 9.5 mg/dL (ref 8.7–10.3)
Creatinine, Ser: 1.54 mg/dL — ABNORMAL HIGH (ref 0.57–1.00)
GFR calc Af Amer: 36 mL/min/{1.73_m2} — ABNORMAL LOW (ref >59)
GFR calc non Af Amer: 31 mL/min/{1.73_m2} — ABNORMAL LOW (ref >59)
Glucose: 177 mg/dL — ABNORMAL HIGH (ref 65–99)
POTASSIUM: 4 mmol/L (ref 3.5–5.2)
SODIUM: 142 mmol/L (ref 134–144)

## 2014-09-10 ENCOUNTER — Telehealth: Payer: Self-pay | Admitting: Pharmacist

## 2014-09-10 NOTE — Telephone Encounter (Signed)
CBC was WNL.  Serum creatinine was elevated compared to January 2015.  She has stopped diclofenac tablets and is only using gel.   Will recheck BMET in 1 month.  Patient aware of above.

## 2014-09-12 ENCOUNTER — Ambulatory Visit: Payer: Medicare Other

## 2014-09-12 ENCOUNTER — Other Ambulatory Visit: Payer: Medicare Other

## 2014-09-21 ENCOUNTER — Ambulatory Visit (INDEPENDENT_AMBULATORY_CARE_PROVIDER_SITE_OTHER): Payer: Medicare Other | Admitting: Family Medicine

## 2014-09-21 ENCOUNTER — Ambulatory Visit (INDEPENDENT_AMBULATORY_CARE_PROVIDER_SITE_OTHER): Payer: Medicare Other

## 2014-09-21 ENCOUNTER — Encounter: Payer: Self-pay | Admitting: Family Medicine

## 2014-09-21 VITALS — BP 154/67 | HR 72 | Temp 98.1°F | Ht 62.0 in | Wt 171.0 lb

## 2014-09-21 DIAGNOSIS — M25561 Pain in right knee: Secondary | ICD-10-CM
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
## 2014-09-24 ENCOUNTER — Ambulatory Visit (INDEPENDENT_AMBULATORY_CARE_PROVIDER_SITE_OTHER): Payer: Medicare Other

## 2014-09-24 ENCOUNTER — Ambulatory Visit (INDEPENDENT_AMBULATORY_CARE_PROVIDER_SITE_OTHER): Payer: Medicare Other | Admitting: Family Medicine

## 2014-09-24 ENCOUNTER — Encounter: Payer: Self-pay | Admitting: Family Medicine

## 2014-09-24 VITALS — BP 130/58 | HR 77 | Temp 96.5°F | Ht 62.0 in | Wt 174.0 lb

## 2014-09-24 DIAGNOSIS — R0602 Shortness of breath: Secondary | ICD-10-CM

## 2014-09-24 DIAGNOSIS — K449 Diaphragmatic hernia without obstruction or gangrene: Secondary | ICD-10-CM

## 2014-09-24 DIAGNOSIS — Z23 Encounter for immunization: Secondary | ICD-10-CM

## 2014-09-24 DIAGNOSIS — IMO0001 Reserved for inherently not codable concepts without codable children: Secondary | ICD-10-CM

## 2014-09-24 DIAGNOSIS — R030 Elevated blood-pressure reading, without diagnosis of hypertension: Secondary | ICD-10-CM

## 2014-09-24 DIAGNOSIS — K760 Fatty (change of) liver, not elsewhere classified: Secondary | ICD-10-CM

## 2014-09-24 MED ORDER — ESTRADIOL 0.025 MG/24HR TD PTTW: 1.0000 | MEDICATED_PATCH | TRANSDERMAL | Status: DC
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
## 2014-09-24 NOTE — Patient Instructions (Signed)
We will call you with the lab work results once those results are available  We will also call you with the chest x-ray result once it is available Continue current medications Do not put yourself at risk for falling

## 2014-09-25 ENCOUNTER — Other Ambulatory Visit: Payer: Self-pay

## 2014-09-25 MED ORDER — HYDROCHLOROTHIAZIDE 12.5 MG PO TABS: 12.5000 mg | ORAL_TABLET | Freq: Every day | ORAL | Status: DC

## 2014-09-26 NOTE — Addendum Note (Signed)
Addended by: Zannie Cove on: 09/26/2014 04:03 PM   Modules accepted: Orders

## 2014-10-10 ENCOUNTER — Other Ambulatory Visit (INDEPENDENT_AMBULATORY_CARE_PROVIDER_SITE_OTHER): Payer: Medicare Other

## 2014-10-10 DIAGNOSIS — K760 Fatty (change of) liver, not elsewhere classified: Secondary | ICD-10-CM

## 2014-10-10 DIAGNOSIS — R0602 Shortness of breath: Secondary | ICD-10-CM

## 2014-10-11 LAB — BMP8+EGFR
BUN / CREAT RATIO: 21 (ref 11–26)
BUN: 21 mg/dL (ref 8–27)
CHLORIDE: 102 mmol/L (ref 97–108)
CO2: 27 mmol/L (ref 18–29)
Calcium: 9.5 mg/dL (ref 8.7–10.3)
Creatinine, Ser: 0.98 mg/dL (ref 0.57–1.00)
GFR, EST AFRICAN AMERICAN: 61 mL/min/{1.73_m2} (ref >59)
GFR, EST NON AFRICAN AMERICAN: 53 mL/min/{1.73_m2} — AB (ref >59)
Glucose: 111 mg/dL — ABNORMAL HIGH (ref 65–99)
POTASSIUM: 4.3 mmol/L (ref 3.5–5.2)
Sodium: 143 mmol/L (ref 134–144)

## 2014-10-11 LAB — THYROID PANEL WITH TSH
FREE THYROXINE INDEX: 2.6 (ref 1.2–4.9)
T3 Uptake Ratio: 27 % (ref 24–39)
T4, Total: 9.7 ug/dL (ref 4.5–12.0)
TSH: 2.65 u[IU]/mL (ref 0.450–4.500)

## 2014-10-11 LAB — HEPATIC FUNCTION PANEL
ALT: 8 IU/L (ref 0–32)
AST: 17 IU/L (ref 0–40)
Albumin: 4.3 g/dL (ref 3.5–4.7)
Alkaline Phosphatase: 66 IU/L (ref 39–117)
BILIRUBIN DIRECT: 0.13 mg/dL (ref 0.00–0.40)
Total Bilirubin: 0.4 mg/dL (ref 0.0–1.2)
Total Protein: 6.4 g/dL (ref 6.0–8.5)

## 2014-10-11 LAB — BRAIN NATRIURETIC PEPTIDE: BNP: 41.2 pg/mL (ref 0.0–100.0)

## 2014-10-12 ENCOUNTER — Other Ambulatory Visit: Payer: Self-pay

## 2014-10-12 MED ORDER — HYDROCHLOROTHIAZIDE 12.5 MG PO TABS: 12.5000 mg | ORAL_TABLET | Freq: Every day | ORAL | Status: DC

## 2014-10-22 ENCOUNTER — Encounter: Payer: Self-pay | Admitting: Family Medicine

## 2014-10-22 ENCOUNTER — Ambulatory Visit (INDEPENDENT_AMBULATORY_CARE_PROVIDER_SITE_OTHER): Payer: Medicare Other | Admitting: Family Medicine

## 2014-10-22 VITALS — BP 119/60 | HR 73 | Temp 97.7°F | Ht 62.0 in | Wt 170.4 lb

## 2014-10-22 DIAGNOSIS — S8001XD Contusion of right knee, subsequent encounter: Secondary | ICD-10-CM

## 2014-10-22 NOTE — Patient Instructions (Signed)
Continue routine activity Follow-up with me in about 4 weeks to complete resolution of contusion Continue to be careful and did not put herself at risk for falling

## 2014-10-22 NOTE — Progress Notes (Signed)
Subjective:    Patient ID: Kristy [name], female    DOB: 1928/12/02, 80 y.o.   MRN: [id number]  HPI Patient here today for follow up on right knee pain.the patient has less swelling and has no specific complaints with her needed day. She is doing better.        Patient Active Problem List   Diagnosis Date Noted  . PAF (paroxysmal atrial fibrillation) 12/18/2013  . Cervical spondylosis without myelopathy 04/27/2013  . ABDOMINAL PAIN, UNSPECIFIED SITE 02/02/2011  . Palpitations 02/26/2010  . COLONIC POLYPS, HYPERPLASTIC 12/07/2007  . LIPOMA 12/07/2007  . OBESITY 12/07/2007  . HYPERTENSION 12/07/2007  . GERD 12/07/2007  . HIATAL HERNIA 12/07/2007  . DIVERTICULOSIS, COLON 12/07/2007  . FATTY LIVER DISEASE 12/07/2007  . ENDOMETRIAL POLYP 12/07/2007  . COMPLEX ENDOMETRIAL HYPERPLASIA WITHOUT ATYPIA 12/07/2007  . SOB 12/07/2007   Outpatient Encounter Prescriptions as of 10/22/2014  Medication Sig  . ALPRAZolam (XANAX) 0.25 MG tablet Take 0.25 mg by mouth daily as needed for anxiety.  . Cholecalciferol (VITAMIN D3) 2000 UNITS TABS Take by mouth daily.  Marland Kitchen estradiol (CLIMARA - DOSED IN MG/24 HR) 0.025 mg/24hr patch Place 0.025 mg onto the skin once a week.  . hydrochlorothiazide (HYDRODIURIL) 12.5 MG tablet Take 1 tablet (12.5 mg total) by mouth daily.  . metoprolol succinate (TOPROL XL) 25 MG 24 hr tablet Take 1 tablet (25 mg total) by mouth daily.  . Multiple Vitamin (MULTIVITAMIN) tablet Take 1 tablet by mouth daily.    Marland Kitchen omeprazole (PRILOSEC) 20 MG capsule Take 20 mg by mouth daily as needed (as needed for heartburn in the evening).  . ONE TOUCH ULTRA TEST test strip USE TO TEST SUGAR TWICE DAILY  . Polyethyl Glycol-Propyl Glycol (SYSTANE OP) Apply 1-2 drops to eye 4 (four) times daily as needed (dry eyes).  . progesterone (PROMETRIUM) 200 MG capsule Take 200 mg by mouth See admin instructions. 12 tabs every four months  . Rivaroxaban (XARELTO) 15 MG TABS tablet Take 1 tablet  (15 mg total) by mouth daily.  . sertraline (ZOLOFT) 50 MG tablet Take 50 mg by mouth daily.  Marland Kitchen trimethoprim (TRIMPEX) 100 MG tablet Take 100 mg by mouth daily as needed (for 3 days as needed for urinary tract infection).     Review of Systems  Constitutional: Negative.   HENT: Negative.   Eyes: Negative.   Respiratory: Negative.   Cardiovascular: Negative.   Gastrointestinal: Negative.   Endocrine: Negative.   Genitourinary: Negative.   Musculoskeletal: Positive for arthralgias (right knee pain).  Skin: Negative.   Allergic/Immunologic: Negative.   Neurological: Negative.   Hematological: Negative.   Psychiatric/Behavioral: Negative.        Objective:   Physical Exam  Constitutional: She is oriented to person, place, and time. She appears well-developed and well-nourished.  HENT:  Head: Normocephalic.  Eyes: Conjunctivae and EOM are normal. Pupils are equal, round, and reactive to light. Right eye exhibits no discharge. Left eye exhibits no discharge.  Neck: Normal range of motion.  Musculoskeletal: Normal range of motion. She exhibits edema.  There is minimal edema of the right knee and minimal tenderness some bruising is still apparent. It is improved from the previous visit.  Neurological: She is alert and oriented to person, place, and time.  Skin: Skin is warm and dry. No rash noted.  Psychiatric: She has a normal mood and affect. Her behavior is normal. Judgment and thought content normal.  Nursing note and vitals reviewed.  BP 119/60  mmHg  Pulse 73  Temp(Src) 97.7 F (36.5 C) (Oral)  Ht 5\' 2"  (1.575 m)  Wt 170 lb 6.4 oz (77.293 kg)  BMI 31.16 kg/m2        Assessment & Plan:  1. Knee contusion, right, subsequent encounter  Patient Instructions  Continue routine activity Follow-up with me in about 4 weeks to complete resolution of contusion Continue to be careful and did not put herself at risk for falling   Arrie Senate MD

## 2014-11-02 ENCOUNTER — Other Ambulatory Visit: Payer: Self-pay | Admitting: Family Medicine

## 2014-11-06 ENCOUNTER — Other Ambulatory Visit: Payer: Self-pay | Admitting: *Deleted

## 2014-11-06 MED ORDER — DIPHENOXYLATE-ATROPINE 2.5-0.025 MG PO TABS: 1.0000 | ORAL_TABLET | Freq: Four times a day (QID) | ORAL | Status: DC | PRN

## 2014-11-21 ENCOUNTER — Ambulatory Visit (INDEPENDENT_AMBULATORY_CARE_PROVIDER_SITE_OTHER): Payer: Medicare Other | Admitting: Family Medicine

## 2014-11-21 ENCOUNTER — Other Ambulatory Visit: Payer: Self-pay | Admitting: *Deleted

## 2014-11-21 ENCOUNTER — Encounter: Payer: Self-pay | Admitting: Family Medicine

## 2014-11-21 VITALS — BP 131/72 | HR 67 | Temp 97.8°F | Ht 62.0 in

## 2014-11-21 DIAGNOSIS — S8001XD Contusion of right knee, subsequent encounter: Secondary | ICD-10-CM

## 2014-11-21 MED ORDER — ALPRAZOLAM 0.25 MG PO TABS: 0.2500 mg | ORAL_TABLET | Freq: Every day | ORAL | Status: DC | PRN

## 2014-11-21 NOTE — Progress Notes (Signed)
Subjective:    Patient ID: Kristy [name], female    DOB: [birth year], 80 y.o.   MRN: [id number]  HPI Patient here toady for 4 week follow up on right knee. She states it is better. She also complains of panic attacks.        Patient Active Problem List   Diagnosis Date Noted  . PAF (paroxysmal atrial fibrillation) 12/18/2013  . Cervical spondylosis without myelopathy 04/27/2013  . ABDOMINAL PAIN, UNSPECIFIED SITE 02/02/2011  . Palpitations 02/26/2010  . COLONIC POLYPS, HYPERPLASTIC 12/07/2007  . LIPOMA 12/07/2007  . OBESITY 12/07/2007  . HYPERTENSION 12/07/2007  . GERD 12/07/2007  . HIATAL HERNIA 12/07/2007  . DIVERTICULOSIS, COLON 12/07/2007  . FATTY LIVER DISEASE 12/07/2007  . ENDOMETRIAL POLYP 12/07/2007  . COMPLEX ENDOMETRIAL HYPERPLASIA WITHOUT ATYPIA 12/07/2007  . SOB 12/07/2007   Outpatient Encounter Prescriptions as of 11/21/2014  Medication Sig  . ALPRAZolam (XANAX) 0.25 MG tablet Take 0.25 mg by mouth daily as needed for anxiety.  . Cholecalciferol (VITAMIN D3) 2000 UNITS TABS Take by mouth daily.  Marland Kitchen estradiol (CLIMARA - DOSED IN MG/24 HR) 0.025 mg/24hr patch Place 0.025 mg onto the skin once a week.  . hydrochlorothiazide (HYDRODIURIL) 12.5 MG tablet Take 1 tablet (12.5 mg total) by mouth daily.  . metoprolol succinate (TOPROL XL) 25 MG 24 hr tablet Take 1 tablet (25 mg total) by mouth daily.  . Multiple Vitamin (MULTIVITAMIN) tablet Take 1 tablet by mouth daily.    Marland Kitchen omeprazole (PRILOSEC) 20 MG capsule TAKE ONE CAPSULE BY MOUTH TWICE DAILY  . ONE TOUCH ULTRA TEST test strip USE TO TEST SUGAR TWICE DAILY  . Polyethyl Glycol-Propyl Glycol (SYSTANE OP) Apply 1-2 drops to eye 4 (four) times daily as needed (dry eyes).  . progesterone (PROMETRIUM) 200 MG capsule Take 200 mg by mouth See admin instructions. 12 tabs every four months  . Rivaroxaban (XARELTO) 15 MG TABS tablet Take 1 tablet (15 mg total) by mouth daily.  . sertraline (ZOLOFT) 50 MG tablet Take 50 mg by  mouth daily.  Marland Kitchen trimethoprim (TRIMPEX) 100 MG tablet Take 100 mg by mouth daily as needed (for 3 days as needed for urinary tract infection).   . diphenoxylate-atropine (LOMOTIL) 2.5-0.025 MG per tablet Take 1 tablet by mouth 4 (four) times daily as needed for diarrhea or loose stools. (Patient not taking: Reported on 11/21/2014)    Review of Systems  Constitutional: Negative.   HENT: Negative.   Eyes: Negative.   Respiratory: Negative.   Cardiovascular: Negative.   Gastrointestinal: Negative.   Endocrine: Negative.   Genitourinary: Negative.   Musculoskeletal: Positive for arthralgias (right knee better - swelling is down).  Skin: Negative.   Allergic/Immunologic: Negative.   Neurological: Negative.   Hematological: Negative.   Psychiatric/Behavioral: Nervous/anxious: panic attacks , stress.        Objective:   Physical Exam  Constitutional: She appears well-developed and well-nourished. No distress.  HENT:  Head: Normocephalic.  Eyes: Conjunctivae and EOM are normal. Pupils are equal, round, and reactive to light. Right eye exhibits no discharge. Left eye exhibits no discharge. No scleral icterus.  Neck: Normal range of motion. Neck supple. No thyromegaly present.  Cardiovascular: Normal rate, regular rhythm and normal heart sounds.   No murmur heard. Pulmonary/Chest: Effort normal and breath sounds normal. No respiratory distress. She has no wheezes. She has no rales. She exhibits no tenderness.  Abdominal: She exhibits no mass.  Musculoskeletal: Normal range of motion. She exhibits no edema or tenderness.  Good mobility of right knee with most of edema resolved. There is still a slight bruising color to the knee and this may resolve with time. There is no erythema or significant tenderness.  Lymphadenopathy:    She has no cervical adenopathy.  Neurological: She is alert.  Skin: Skin is warm and dry. No rash noted. No erythema.  Psychiatric: She has a normal mood and  affect. Her behavior is normal. Judgment and thought content normal.  Nursing note and vitals reviewed.  BP 131/72 mmHg  Pulse 67  Temp(Src) 97.8 F (36.6 C) (Oral)  Ht 5\' 2"  (1.575 m)        Assessment & Plan:  1. Knee contusion, right, subsequent encounter -Resolved  Patient Instructions  The knee contusion has resolved Continue your regular medicines as doing   Arrie Senate MD

## 2014-11-21 NOTE — Patient Instructions (Signed)
The knee contusion has resolved Continue your regular medicines as doing

## 2014-12-01 ENCOUNTER — Other Ambulatory Visit: Payer: Self-pay | Admitting: Family Medicine

## 2014-12-07 DIAGNOSIS — S42302A Unspecified fracture of shaft of humerus, left arm, initial encounter for closed fracture: Secondary | ICD-10-CM

## 2014-12-07 HISTORY — DX: Unspecified fracture of shaft of humerus, left arm, initial encounter for closed fracture: S42.302A

## 2014-12-29 ENCOUNTER — Other Ambulatory Visit: Payer: Self-pay | Admitting: Physician Assistant

## 2015-02-20 ENCOUNTER — Ambulatory Visit (INDEPENDENT_AMBULATORY_CARE_PROVIDER_SITE_OTHER): Payer: Medicare Other | Admitting: Cardiology

## 2015-02-20 ENCOUNTER — Encounter: Payer: Self-pay | Admitting: Cardiology

## 2015-02-20 VITALS — BP 128/64 | HR 62 | Ht 62.0 in | Wt 167.0 lb

## 2015-02-20 DIAGNOSIS — I480 Paroxysmal atrial fibrillation: Secondary | ICD-10-CM | POA: Diagnosis not present

## 2015-02-20 NOTE — Progress Notes (Signed)
HPI Patient presents for follow up of atrial fib.   She had been managed with Xarelto.  She has had no further symptomatic recurrence of this. He denies any chest pressure, neck or arm discomfort. She has no PND or orthopnea.  She did have some pulmonary HTN and TR on echo.   Since I last saw her she has no new complaints. Her chronic dyspnea is actually a little but better seen she's taking an over-the-counter allergy medicine that I reviewed. It is a medication with lots of different ingredients including pineapple and I told her I'm not familiar with the medicinal qualities of most of those medicines. He is not having any palpitations, presyncope or syncope. She's had no PND or orthopnea. She is fatigued but she's caring for her 50 year old husband who has some memory problems.  Allergies  Allergen Reactions  . Codeine Nausea Only  . Tramadol Nausea Only    Current Outpatient Prescriptions  Medication Sig Dispense Refill  . ALPRAZolam (XANAX) 0.25 MG tablet Take 1 tablet (0.25 mg total) by mouth daily as needed for anxiety. 30 tablet 1  . Cholecalciferol (VITAMIN D3) 2000 UNITS TABS Take by mouth daily.    . diphenoxylate-atropine (LOMOTIL) 2.5-0.025 MG per tablet Take 1 tablet by mouth 4 (four) times daily as needed for diarrhea or loose stools. 30 tablet 0  . estradiol (CLIMARA - DOSED IN MG/24 HR) 0.025 mg/24hr patch Place 0.025 mg onto the skin once a week.    . hydrochlorothiazide (HYDRODIURIL) 12.5 MG tablet Take 1 tablet (12.5 mg total) by mouth daily. 90 tablet 1  . metoprolol succinate (TOPROL XL) 25 MG 24 hr tablet Take 1 tablet (25 mg total) by mouth daily. 90 tablet 3  . Multiple Vitamin (MULTIVITAMIN) tablet Take 1 tablet by mouth daily.      Marland Kitchen omeprazole (PRILOSEC) 20 MG capsule TAKE ONE CAPSULE BY MOUTH TWICE DAILY 60 capsule 5  . Polyethyl Glycol-Propyl Glycol (SYSTANE OP) Apply 1-2 drops to eye 4 (four) times daily as needed (dry eyes).    . progesterone (PROMETRIUM) 200  MG capsule Take 200 mg by mouth See admin instructions. 12 tabs every four months    . sertraline (ZOLOFT) 50 MG tablet TAKE ONE TABLET BY MOUTH ONE TIME DAILY 90 tablet 0  . XARELTO 15 MG TABS tablet TAKE 1 TABLET (15 MG TOTAL) BY MOUTH DAILY. 30 tablet 5  . ONE TOUCH ULTRA TEST test strip USE TO TEST SUGAR TWICE DAILY 100 each 6  . trimethoprim (TRIMPEX) 100 MG tablet Take 100 mg by mouth daily as needed (for 3 days as needed for urinary tract infection).      No current facility-administered medications for this visit.    Past Medical History  Diagnosis Date  . Hypertension   . Obesity   . Colon polyps   . Endometrial polyp   . SOB (shortness of breath)   . Diverticulosis   . GERD (gastroesophageal reflux disease)   . Hiatal hernia   . Endometrial hyperplasia without atypia, simple   . Hyperplastic colon polyp   . Lipoma   . Fatty liver     Past Surgical History  Procedure Laterality Date  . Cholecystectomy  03/2002  . Umbilical hernia repair  09/2002  . Knee surgery    . Optic neuritis    . Dilation and curettage of uterus      x 2  . Cataract extraction    . Breast biopsy    .  Hernia repair      ROS:  As stated in the HPI and negative for all other systems.  PHYSICAL EXAM BP 128/64 mmHg  Pulse 62  Ht 5\' 2"  (1.575 m)  Wt 167 lb (75.751 kg)  BMI 30.54 kg/m2 GENERAL:  Well appearing NECK:  No jugular venous distention, waveform within normal limits, carotid upstroke brisk and symmetric, no bruits, no thyromegaly LUNGS:  Clear to auscultation bilaterally CHEST:  Unremarkable HEART:  PMI not displaced or sustained,S1 and S2 within normal limits, no S3, no S4, no clicks, no rubs, no murmurs ABD:  Flat, positive bowel sounds normal in frequency in pitch, no bruits, no rebound, no guarding, no midline pulsatile mass, no hepatomegaly, no splenomegaly EXT:  2 plus pulses throughout, no edema, no cyanosis no clubbing   EKG:  Sinus rhythm, rate 62 left axis deviation,  left anterior fascicular block, poor anterior R wave progression, first degree AV block, no acute ST-T wave changes.  02/20/2015   ASSESSMENT AND PLAN  ATRIAL FIB:  She has had no symptomatic recurrence of this.  Kristy [name] has a CHA2DS2 - VASc score of 5 with a risk of stroke of 6.7%  and a HAS - BLED score of 1 with a low risk of bleeding.  Given this the risk benefits clearly fall on the side of continuing anticoagulant.  However, her creatinine clearance was slightly reduced previously with her mildly increased creatinine. Therefore, she did have her dose reduced to 15 mg.  I have reviewed this and she is off nonsteroidals her creatinine is back up. Perhaps it repeated readings continue to demonstrate improvement we would increase the dose back to 20 mg. For now she'll remain on the dose as listed.  HTN:  The blood pressure is at target. No change in medications is indicated. We will continue with therapeutic lifestyle changes (TLC).  DYSPNEA:  This actually is a little bit better. I do note TR and some pulmonary hypertension on her echo in the past. However, since she is improved no further imaging or change in therapy is planned.

## 2015-02-20 NOTE — Patient Instructions (Signed)
The current medical regimen is effective;  continue present plan and medications.  Follow up in 1 year with Dr. Percival Spanish.  You will receive a letter in the mail 2 months before you are due.  Please call us when you receive this letter to schedule your follow up appointment.  Thank you for choosing [facility]!!

## 2015-02-24 ENCOUNTER — Other Ambulatory Visit: Payer: Self-pay | Admitting: Family Medicine

## 2015-03-05 ENCOUNTER — Encounter: Payer: Self-pay | Admitting: *Deleted

## 2015-03-06 ENCOUNTER — Encounter: Payer: Self-pay | Admitting: *Deleted

## 2015-03-06 ENCOUNTER — Ambulatory Visit (INDEPENDENT_AMBULATORY_CARE_PROVIDER_SITE_OTHER): Payer: Medicare Other | Admitting: *Deleted

## 2015-03-06 ENCOUNTER — Ambulatory Visit: Payer: Self-pay | Admitting: *Deleted

## 2015-03-06 VITALS — BP 139/66 | HR 64 | Ht 61.0 in | Wt 170.0 lb

## 2015-03-06 DIAGNOSIS — Z0000 Encounter for general adult medical examination without abnormal findings: Secondary | ICD-10-CM | POA: Diagnosis not present

## 2015-03-06 MED ORDER — PROGESTERONE MICRONIZED 200 MG PO CAPS: ORAL_CAPSULE | ORAL | Status: DC

## 2015-03-06 NOTE — Patient Instructions (Addendum)
Try to incorporate exercise into your daily routine. Add walking 3 days a week and fitness videos at home.   Preventive Care for Adults A healthy lifestyle and preventive care can promote health and wellness. Preventive health guidelines for women include the following key practices.  A routine yearly physical is a good way to check with your health care provider about your health and preventive screening. It is a chance to share any concerns and updates on your health and to receive a thorough exam.  Visit your dentist for a routine exam and preventive care every 6 months. Brush your teeth twice a day and floss once a day. Good oral hygiene prevents tooth decay and gum disease.  The frequency of eye exams is based on your age, health, family medical history, use of contact lenses, and other factors. Follow your health care provider's recommendations for frequency of eye exams.  Eat a healthy diet. Foods like vegetables, fruits, whole grains, low-fat dairy products, and lean protein foods contain the nutrients you need without too many calories. Decrease your intake of foods high in solid fats, added sugars, and salt. Eat the right amount of calories for you.Get information about a proper diet from your health care provider, if necessary.  Regular physical exercise is one of the most important things you can do for your health. Most adults should get at least 150 minutes of moderate-intensity exercise (any activity that increases your heart rate and causes you to sweat) each week. In addition, most adults need muscle-strengthening exercises on 2 or more days a week.  Maintain a healthy weight. The body mass index (BMI) is a screening tool to identify possible weight problems. It provides an estimate of body fat based on height and weight. Your health care provider can find your BMI and can help you achieve or maintain a healthy weight.For adults 20 years and older:  A BMI below 18.5 is considered  underweight.  A BMI of 18.5 to 24.9 is normal.  A BMI of 25 to 29.9 is considered overweight.  A BMI of 30 and above is considered obese.  Maintain normal blood lipids and cholesterol levels by exercising and minimizing your intake of saturated fat. Eat a balanced diet with plenty of fruit and vegetables. Blood tests for lipids and cholesterol should begin at age 62 and be repeated every 5 years. If your lipid or cholesterol levels are high, you are over 50, or you are at high risk for heart disease, you may need your cholesterol levels checked more frequently.Ongoing high lipid and cholesterol levels should be treated with medicines if diet and exercise are not working.  If you smoke, find out from your health care provider how to quit. If you do not use tobacco, do not start.  Lung cancer screening is recommended for adults aged 54-80 years who are at high risk for developing lung cancer because of a history of smoking. A yearly low-dose CT scan of the lungs is recommended for people who have at least a 30-pack-year history of smoking and are a current smoker or have quit within the past 15 years. A pack year of smoking is smoking an average of 1 pack of cigarettes a day for 1 year (for example: 1 pack a day for 30 years or 2 packs a day for 15 years). Yearly screening should continue until the smoker has stopped smoking for at least 15 years. Yearly screening should be stopped for people who develop a health problem that  would prevent them from having lung cancer treatment.  If you are pregnant, do not drink alcohol. If you are breastfeeding, be very cautious about drinking alcohol. If you are not pregnant and choose to drink alcohol, do not have more than 1 drink per day. One drink is considered to be 12 ounces (355 mL) of beer, 5 ounces (148 mL) of wine, or 1.5 ounces (44 mL) of liquor.  Avoid use of street drugs. Do not share needles with anyone. Ask for help if you need support or  instructions about stopping the use of drugs.  High blood pressure causes heart disease and increases the risk of stroke. Your blood pressure should be checked at least every 1 to 2 years. Ongoing high blood pressure should be treated with medicines if weight loss and exercise do not work.  If you are 86-59 years old, ask your health care provider if you should take aspirin to prevent strokes.  Diabetes screening involves taking a blood sample to check your fasting blood sugar level. This should be done once every 3 years, after age 26, if you are within normal weight and without risk factors for diabetes. Testing should be considered at a younger age or be carried out more frequently if you are overweight and have at least 1 risk factor for diabetes.  Breast cancer screening is essential preventive care for women. You should practice "breast self-awareness." This means understanding the normal appearance and feel of your breasts and may include breast self-examination. Any changes detected, no matter how small, should be reported to a health care provider. Women in their 78s and 30s should have a clinical breast exam (CBE) by a health care provider as part of a regular health exam every 1 to 3 years. After age 12, women should have a CBE every year. Starting at age 46, women should consider having a mammogram (breast X-ray test) every year. Women who have a family history of breast cancer should talk to their health care provider about genetic screening. Women at a high risk of breast cancer should talk to their health care providers about having an MRI and a mammogram every year.  Breast cancer gene (BRCA)-related cancer risk assessment is recommended for women who have family members with BRCA-related cancers. BRCA-related cancers include breast, ovarian, tubal, and peritoneal cancers. Having family members with these cancers may be associated with an increased risk for harmful changes (mutations) in  the breast cancer genes BRCA1 and BRCA2. Results of the assessment will determine the need for genetic counseling and BRCA1 and BRCA2 testing.  Routine pelvic exams to screen for cancer are no longer recommended for nonpregnant women who are considered low risk for cancer of the pelvic organs (ovaries, uterus, and vagina) and who do not have symptoms. Ask your health care provider if a screening pelvic exam is right for you.  If you have had past treatment for cervical cancer or a condition that could lead to cancer, you need Pap tests and screening for cancer for at least 20 years after your treatment. If Pap tests have been discontinued, your risk factors (such as having a new sexual partner) need to be reassessed to determine if screening should be resumed. Some women have medical problems that increase the chance of getting cervical cancer. In these cases, your health care provider may recommend more frequent screening and Pap tests.  The HPV test is an additional test that may be used for cervical cancer screening. The HPV test looks  for the virus that can cause the cell changes on the cervix. The cells collected during the Pap test can be tested for HPV. The HPV test could be used to screen women aged 31 years and older, and should be used in women of any age who have unclear Pap test results. After the age of 26, women should have HPV testing at the same frequency as a Pap test.  Colorectal cancer can be detected and often prevented. Most routine colorectal cancer screening begins at the age of 29 years and continues through age 59 years. However, your health care provider may recommend screening at an earlier age if you have risk factors for colon cancer. On a yearly basis, your health care provider may provide home test kits to check for hidden blood in the stool. Use of a small camera at the end of a tube, to directly examine the colon (sigmoidoscopy or colonoscopy), can detect the earliest forms  of colorectal cancer. Talk to your health care provider about this at age 66, when routine screening begins. Direct exam of the colon should be repeated every 5-10 years through age 89 years, unless early forms of pre-cancerous polyps or small growths are found.  People who are at an increased risk for hepatitis B should be screened for this virus. You are considered at high risk for hepatitis B if:  You were born in a country where hepatitis B occurs often. Talk with your health care provider about which countries are considered high risk.  Your parents were born in a high-risk country and you have not received a shot to protect against hepatitis B (hepatitis B vaccine).  You have HIV or AIDS.  You use needles to inject street drugs.  You live with, or have sex with, someone who has hepatitis B.  You get hemodialysis treatment.  You take certain medicines for conditions like cancer, organ transplantation, and autoimmune conditions.  Hepatitis C blood testing is recommended for all people born from [birth year] through 1965 and any individual with known risks for hepatitis C.  Practice safe sex. Use condoms and avoid high-risk sexual practices to reduce the spread of sexually transmitted infections (STIs). STIs include gonorrhea, chlamydia, syphilis, trichomonas, herpes, HPV, and human immunodeficiency virus (HIV). Herpes, HIV, and HPV are viral illnesses that have no cure. They can result in disability, cancer, and death.  You should be screened for sexually transmitted illnesses (STIs) including gonorrhea and chlamydia if:  You are sexually active and are younger than 24 years.  You are older than 24 years and your health care provider tells you that you are at risk for this type of infection.  Your sexual activity has changed since you were last screened and you are at an increased risk for chlamydia or gonorrhea. Ask your health care provider if you are at risk.  If you are at risk of  being infected with HIV, it is recommended that you take a prescription medicine daily to prevent HIV infection. This is called preexposure prophylaxis (PrEP). You are considered at risk if:  You are a heterosexual woman, are sexually active, and are at increased risk for HIV infection.  You take drugs by injection.  You are sexually active with a partner who has HIV.  Talk with your health care provider about whether you are at high risk of being infected with HIV. If you choose to begin PrEP, you should first be tested for HIV. You should then be tested every 3 months  for as long as you are taking PrEP.  Osteoporosis is a disease in which the bones lose minerals and strength with aging. This can result in serious bone fractures or breaks. The risk of osteoporosis can be identified using a bone density scan. Women ages 57 years and over and women at risk for fractures or osteoporosis should discuss screening with their health care providers. Ask your health care provider whether you should take a calcium supplement or vitamin D to reduce the rate of osteoporosis.  Menopause can be associated with physical symptoms and risks. Hormone replacement therapy is available to decrease symptoms and risks. You should talk to your health care provider about whether hormone replacement therapy is right for you.  Use sunscreen. Apply sunscreen liberally and repeatedly throughout the day. You should seek shade when your shadow is shorter than you. Protect yourself by wearing long sleeves, pants, a wide-brimmed hat, and sunglasses year round, whenever you are outdoors.  Once a month, do a whole body skin exam, using a mirror to look at the skin on your back. Tell your health care provider of new moles, moles that have irregular borders, moles that are larger than a pencil eraser, or moles that have changed in shape or color.  Stay current with required vaccines (immunizations).  Influenza vaccine. All adults  should be immunized every year.  Tetanus, diphtheria, and acellular pertussis (Td, Tdap) vaccine. Pregnant women should receive 1 dose of Tdap vaccine during each pregnancy. The dose should be obtained regardless of the length of time since the last dose. Immunization is preferred during the 27th-36th week of gestation. An adult who has not previously received Tdap or who does not know her vaccine status should receive 1 dose of Tdap. This initial dose should be followed by tetanus and diphtheria toxoids (Td) booster doses every 10 years. Adults with an unknown or incomplete history of completing a 3-dose immunization series with Td-containing vaccines should begin or complete a primary immunization series including a Tdap dose. Adults should receive a Td booster every 10 years.  Varicella vaccine. An adult without evidence of immunity to varicella should receive 2 doses or a second dose if she has previously received 1 dose. Pregnant females who do not have evidence of immunity should receive the first dose after pregnancy. This first dose should be obtained before leaving the health care facility. The second dose should be obtained 4-8 weeks after the first dose.  Human papillomavirus (HPV) vaccine. Females aged 13-26 years who have not received the vaccine previously should obtain the 3-dose series. The vaccine is not recommended for use in pregnant females. However, pregnancy testing is not needed before receiving a dose. If a female is found to be pregnant after receiving a dose, no treatment is needed. In that case, the remaining doses should be delayed until after the pregnancy. Immunization is recommended for any person with an immunocompromised condition through the age of 43 years if she did not get any or all doses earlier. During the 3-dose series, the second dose should be obtained 4-8 weeks after the first dose. The third dose should be obtained 24 weeks after the first dose and 16 weeks after  the second dose.  Zoster vaccine. One dose is recommended for adults aged 4 years or older unless certain conditions are present.  Measles, mumps, and rubella (MMR) vaccine. Adults born before [birth year] generally are considered immune to measles and mumps. Adults born in [birth year] or later should have 1 or  more doses of MMR vaccine unless there is a contraindication to the vaccine or there is laboratory evidence of immunity to each of the three diseases. A routine second dose of MMR vaccine should be obtained at least 28 days after the first dose for students attending postsecondary schools, health care workers, or international travelers. People who received inactivated measles vaccine or an unknown type of measles vaccine during 1963-1967 should receive 2 doses of MMR vaccine. People who received inactivated mumps vaccine or an unknown type of mumps vaccine before 1979 and are at high risk for mumps infection should consider immunization with 2 doses of MMR vaccine. For females of childbearing age, rubella immunity should be determined. If there is no evidence of immunity, females who are not pregnant should be vaccinated. If there is no evidence of immunity, females who are pregnant should delay immunization until after pregnancy. Unvaccinated health care workers born before [birth year] who lack laboratory evidence of measles, mumps, or rubella immunity or laboratory confirmation of disease should consider measles and mumps immunization with 2 doses of MMR vaccine or rubella immunization with 1 dose of MMR vaccine.  Pneumococcal 13-valent conjugate (PCV13) vaccine. When indicated, a person who is uncertain of her immunization history and has no record of immunization should receive the PCV13 vaccine. An adult aged 36 years or older who has certain medical conditions and has not been previously immunized should receive 1 dose of PCV13 vaccine. This PCV13 should be followed with a dose of pneumococcal polysaccharide (PPSV23)  vaccine. The PPSV23 vaccine dose should be obtained at least 8 weeks after the dose of PCV13 vaccine. An adult aged 51 years or older who has certain medical conditions and previously received 1 or more doses of PPSV23 vaccine should receive 1 dose of PCV13. The PCV13 vaccine dose should be obtained 1 or more years after the last PPSV23 vaccine dose.  Pneumococcal polysaccharide (PPSV23) vaccine. When PCV13 is also indicated, PCV13 should be obtained first. All adults aged 28 years and older should be immunized. An adult younger than age 6 years who has certain medical conditions should be immunized. Any person who resides in a nursing home or long-term care facility should be immunized. An adult smoker should be immunized. People with an immunocompromised condition and certain other conditions should receive both PCV13 and PPSV23 vaccines. People with human immunodeficiency virus (HIV) infection should be immunized as soon as possible after diagnosis. Immunization during chemotherapy or radiation therapy should be avoided. Routine use of PPSV23 vaccine is not recommended for American Indians, [place] Natives, or people younger than 65 years unless there are medical conditions that require PPSV23 vaccine. When indicated, people who have unknown immunization and have no record of immunization should receive PPSV23 vaccine. One-time revaccination 5 years after the first dose of PPSV23 is recommended for people aged 19-64 years who have chronic kidney failure, nephrotic syndrome, asplenia, or immunocompromised conditions. People who received 1-2 doses of PPSV23 before age 27 years should receive another dose of PPSV23 vaccine at age 4 years or later if at least 5 years have passed since the previous dose. Doses of PPSV23 are not needed for people immunized with PPSV23 at or after age 96 years.  Meningococcal vaccine. Adults with asplenia or persistent complement component deficiencies should receive 2 doses of  quadrivalent meningococcal conjugate (MenACWY-D) vaccine. The doses should be obtained at least 2 months apart. Microbiologists working with certain meningococcal bacteria, [employer] recruits, people at risk during an outbreak, and people who travel  to or live in countries with a high rate of meningitis should be immunized. A first-year college student up through age 32 years who is living in a residence hall should receive a dose if she did not receive a dose on or after her 16th birthday. Adults who have certain high-risk conditions should receive one or more doses of vaccine.  Hepatitis A vaccine. Adults who wish to be protected from this disease, have certain high-risk conditions, work with hepatitis A-infected animals, work in hepatitis A research labs, or travel to or work in countries with a high rate of hepatitis A should be immunized. Adults who were previously unvaccinated and who anticipate close contact with an international adoptee during the first 60 days after arrival in the [place] from a country with a high rate of hepatitis A should be immunized.  Hepatitis B vaccine. Adults who wish to be protected from this disease, have certain high-risk conditions, may be exposed to blood or other infectious body fluids, are household contacts or sex partners of hepatitis B positive people, are clients or workers in certain care facilities, or travel to or work in countries with a high rate of hepatitis B should be immunized.  Haemophilus influenzae type b (Hib) vaccine. A previously unvaccinated person with asplenia or sickle cell disease or having a scheduled splenectomy should receive 1 dose of Hib vaccine. Regardless of previous immunization, a recipient of a hematopoietic stem cell transplant should receive a 3-dose series 6-12 months after her successful transplant. Hib vaccine is not recommended for adults with HIV infection. Preventive Services / Frequency  Ages 54 years and over  Blood  pressure check.** / Every 1 to 2 years.  Lipid and cholesterol check.** / Every 5 years beginning at age 55 years.  Lung cancer screening. / Every year if you are aged 65-80 years and have a 30-pack-year history of smoking and currently smoke or have quit within the past 15 years. Yearly screening is stopped once you have quit smoking for at least 15 years or develop a health problem that would prevent you from having lung cancer treatment.  Clinical breast exam.** / Every year after age 26 years.  BRCA-related cancer risk assessment.** / For women who have family members with a BRCA-related cancer (breast, ovarian, tubal, or peritoneal cancers).  Mammogram.** / Every year beginning at age 53 years and continuing for as long as you are in good health. Consult with your health care provider.  Pap test.** / Every 3 years starting at age 15 years through age 38 or 24 years with 3 consecutive normal Pap tests. Testing can be stopped between 65 and 70 years with 3 consecutive normal Pap tests and no abnormal Pap or HPV tests in the past 10 years.  HPV screening.** / Every 3 years from ages 52 years through ages 38 or 52 years with a history of 3 consecutive normal Pap tests. Testing can be stopped between 65 and 70 years with 3 consecutive normal Pap tests and no abnormal Pap or HPV tests in the past 10 years.  Fecal occult blood test (FOBT) of stool. / Every year beginning at age 58 years and continuing until age 27 years. You may not need to do this test if you get a colonoscopy every 10 years.  Flexible sigmoidoscopy or colonoscopy.** / Every 5 years for a flexible sigmoidoscopy or every 10 years for a colonoscopy beginning at age 36 years and continuing until age 32 years.  Hepatitis C blood  test.** / For all people born from [birth year] through 1963 and any individual with known risks for hepatitis C.  Osteoporosis screening.** / A one-time screening for women ages 27 years and over and women at risk  for fractures or osteoporosis.  Skin self-exam. / Monthly.  Influenza vaccine. / Every year.  Tetanus, diphtheria, and acellular pertussis (Tdap/Td) vaccine.** / 1 dose of Td every 10 years.  Varicella vaccine.** / Consult your health care provider.  Zoster vaccine.** / 1 dose for adults aged 47 years or older.  Pneumococcal 13-valent conjugate (PCV13) vaccine.** / Consult your health care provider.  Pneumococcal polysaccharide (PPSV23) vaccine.** / 1 dose for all adults aged 74 years and older.  Meningococcal vaccine.** / Consult your health care provider.  Hepatitis A vaccine.** / Consult your health care provider.  Hepatitis B vaccine.** / Consult your health care provider.  Haemophilus influenzae type b (Hib) vaccine.** / Consult your health care provider. ** Family history and personal history of risk and conditions may change your health care provider's recommendations. Document Released: 01/19/2002 Document Revised: 04/09/2014 Document Reviewed: 04/20/2011 [facility] Patient Information 2015 [place], [place]. This information is not intended to replace advice given to you by your health care provider. Make sure you discuss any questions you have with your health care provider.

## 2015-03-06 NOTE — Progress Notes (Signed)
Subjective:   Kristy [name] is a 81 y.o. female who presents for an Initial Medicare Annual Wellness Visit. 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 She drives and keeps up with her household and finances. She assists her husband who has some mild cognitive impairment since having a stroke several years ago. Years ago they enjoyed formal dance classes but they haven't participated in these in some time. She does complain of some balance problems and feels that dancing would be difficult at this time. She doesn't exercise formally but does housework and some gardening. She planted pansies earlier today.   Review of Systems      Cardiac Risk Factors include: dyslipidemia;hypertension;sedentary lifestyle;obesity (BMI >30kg/m2);advanced age (>85men, >22 women)  ROS otherwise negative today    Objective:    Today's Vitals   03/06/15 1433  BP: 139/66  Pulse: 64  Height: 5\' 1"  (1.549 m)  Weight: 170 lb (77.111 kg)  BMI                     32.12  Current Medications (verified) Outpatient Encounter Prescriptions as of 03/06/2015  Medication Sig  . ALPRAZolam (XANAX) 0.25 MG tablet Take 1 tablet (0.25 mg total) by mouth daily as needed for anxiety.  . Cholecalciferol (VITAMIN D3) 2000 UNITS TABS Take by mouth daily.  Marland Kitchen estradiol (CLIMARA - DOSED IN MG/24 HR) 0.025 mg/24hr patch Place 0.025 mg onto the skin once a week.  . hydrochlorothiazide (HYDRODIURIL) 12.5 MG tablet Take 1 tablet (12.5 mg total) by mouth daily.  . metoprolol succinate (TOPROL XL) 25 MG 24 hr tablet Take 1 tablet (25 mg total) by mouth daily.  . Multiple Vitamin (MULTIVITAMIN) tablet Take 1 tablet by mouth daily.    Marland Kitchen omeprazole (PRILOSEC) 20 MG capsule TAKE ONE CAPSULE BY MOUTH TWICE DAILY  . ONE TOUCH ULTRA TEST test strip USE TO TEST SUGAR TWICE DAILY  . Polyethyl Glycol-Propyl Glycol (SYSTANE OP) Apply 1-2 drops to eye 4 (four) times daily as needed (dry eyes).  .  progesterone (PROMETRIUM) 200 MG capsule Take 200 mg by mouth See admin instructions. 12 tabs every four months  . sertraline (ZOLOFT) 50 MG tablet TAKE ONE TABLET BY MOUTH ONE TIME DAILY  . XARELTO 15 MG TABS tablet TAKE 1 TABLET (15 MG TOTAL) BY MOUTH DAILY.  . [DISCONTINUED] diphenoxylate-atropine (LOMOTIL) 2.5-0.025 MG per tablet Take 1 tablet by mouth 4 (four) times daily as needed for diarrhea or loose stools.  . [DISCONTINUED] trimethoprim (TRIMPEX) 100 MG tablet Take 100 mg by mouth daily as needed (for 3 days as needed for urinary tract infection).     Allergies (verified) Codeine and Tramadol   History: Past Medical History  Diagnosis Date  . Hypertension   . Obesity   . Colon polyps   . Endometrial polyp   . SOB (shortness of breath)   . Diverticulosis   . GERD (gastroesophageal reflux disease)   . Hiatal hernia   . Endometrial hyperplasia without atypia, simple   . Hyperplastic colon polyp   . Lipoma   . Fatty liver   . A-fib    Past Surgical History  Procedure Laterality Date  . Cholecystectomy  03/2002  . Umbilical hernia repair  09/2002  . Knee surgery    . Optic neuritis    . Dilation and curettage of uterus      x 2  . Cataract extraction    .  Breast biopsy    . Hernia repair     Family History  Problem Relation Age of Onset  . Diabetes Mother   . Heart disease Mother   . Diabetes Sister   . Diabetes Brother   . Heart disease Brother   . Colon cancer Neg Hx    Social History   Occupational History  . [employer]     Retired   Social History Main Topics  . Smoking status: Former Smoker    Quit date: 12/07/1982  . Smokeless tobacco: Never Used  . Alcohol Use: No  . Drug Use: No  . Sexual Activity: Not on file   Activities of Daily Living In your present state of health, do you have any difficulty performing the following activities: 03/06/2015  Is the patient deaf or have difficulty hearing? N  Hearing N  Vision  N  Difficulty concentrating or making decisions Y  Walking or climbing stairs? N  Doing errands, shopping? N  Preparing Food and eating ? N  Using the Toilet? N  In the past six months, have you accidently leaked urine? Y  Do you have problems with loss of bowel control? N  Managing your Medications? N  Managing your Finances? N  Housekeeping or managing your Housekeeping? N    Immunizations and Health Maintenance Immunization History  Administered Date(s) Administered  . Influenza,inj,Quad PF,36+ Mos 09/29/2013, 09/24/2014  . Pneumococcal Conjugate-13 09/29/2013  . Pneumococcal Polysaccharide-23 12/07/1996  . Zoster 06/06/2006   Health Maintenance Due  Topic Date Due  . TETANUS/TDAP  10/08/1949  . DEXA SCAN  10/09/1995   Cost of Tdap to patient today would be $45. She elected to wait until her appointment in April and check on the price at that time.  She declined a dexa scan. She has taken Actonel in the past as was taken off it after 5 years by her gynecologist. She doesn't feel that screening would be of any benefit since she doesn't intend on taking any treatment for osteoporosis.  She has received a reminder to schedule her mammogram and she intends on scheduling this soon.   Patient Care Team: Chipper Herb, MD as PCP - General (Family Medicine) Minus Breeding, MD as Consulting Physician (Cardiology) Lafayette Dragon, MD as Consulting Physician (Gastroenterology)  Clent Jacks, MD as Consulting Physician (Opthalmology) Raeanne Gathers, AUD as Consulting Physician (Audiology)     Assessment:   This is a routine wellness examination for Kristy [name].  Hearing/Vision screen No hearing or vision deficits noted during exam. Patient does wear glasses for reading but not for distance vision. She does wear hearing aids which work well for her.   Dietary issues and exercise activities discussed: Current Exercise Habits:: The patient does not participate in regular exercise at present  (Housework and [place]. Patient use to participate in "Silver Sneakers" at the [facility] ) She has fitness videos that she has used in the past.   Goals Exercise: Use fitness videos or walk for 30 minutes 3-4 times per week.   Depression Screen PHQ 2/9 Scores 03/06/2015 11/21/2014 10/22/2014  PHQ - 2 Score 1 0 1    Fall Risk Fall Risk  03/06/2015 11/21/2014 10/22/2014  Falls in the past year? Yes No No  Number falls in past yr: 2 or more - -  Injury with Fall? No - -  Risk for fall due to : History of fall(s) - -    Cognitive Function: MMSE - Mini Mental State Exam  03/06/2015  Orientation to time 5  Orientation to Place 5  Registration 3  Attention/ Calculation 5  Recall 3  Language- name 2 objects 2  Language- repeat 1  Language- follow 3 step command 3  Language- read & follow direction 1  Write a sentence 1  Copy design 1  Total score 30    Screening Tests Health Maintenance  Topic Date Due  . TETANUS/TDAP  10/08/1949  . DEXA SCAN  10/09/1995  . INFLUENZA VACCINE  07/08/2015  . COLONOSCOPY  03/19/2021  . ZOSTAVAX  Completed  . PNA vac Low Risk Adult  Completed      Plan:    Keep routine appt with Dr Laurance Flatten in April  During the course of the visit, Sharel was educated and counseled about the following appropriate screening and preventive services:   Vaccines to include Pneumoccal (up to date), Influenza (up to date),Tdap (will check cost at next visit), Zostavax (up to date)  Electrocardiogram-Updated on 02/20/15  Cardiovascular disease screening-Followed by Dr Percival Spanish  Colorectal cancer screening-FOBT done 08/2014. Pt declines colonoscopy.    Bone density screening-Patient declined. She has taken Actonel in the past and was instructed to d/c calcium.   Diabetes screening-Glucose testing done at each regular visit  Glaucoma screening-Appt with Dr Katy Fitch in 04/2015. Have them forward a copy of exam to our office.   Mammography/PAP-Patient received reminder and will  call to schedule  Nutrition counseling-Healthy diet including fruits, vegetables and lean proteins.   Exercise-Use fitness videos or walk for 30 minutes 3-4 times per week.   Patient Instructions (the written plan) were given to the patient.    Chong Sicilian, RN  03/06/2015      I have reviewed and agree with the above AWV documentation.  Claretta Fraise, M.D.

## 2015-03-26 ENCOUNTER — Ambulatory Visit (INDEPENDENT_AMBULATORY_CARE_PROVIDER_SITE_OTHER): Payer: Medicare Other | Admitting: Family Medicine

## 2015-03-26 ENCOUNTER — Encounter: Payer: Self-pay | Admitting: Family Medicine

## 2015-03-26 VITALS — BP 141/60 | HR 73 | Temp 96.9°F | Ht 61.0 in | Wt 168.0 lb

## 2015-03-26 DIAGNOSIS — K760 Fatty (change of) liver, not elsewhere classified: Secondary | ICD-10-CM

## 2015-03-26 DIAGNOSIS — E559 Vitamin D deficiency, unspecified: Secondary | ICD-10-CM

## 2015-03-26 DIAGNOSIS — F4322 Adjustment disorder with anxiety: Secondary | ICD-10-CM

## 2015-03-26 DIAGNOSIS — I480 Paroxysmal atrial fibrillation: Secondary | ICD-10-CM | POA: Diagnosis not present

## 2015-03-26 DIAGNOSIS — R030 Elevated blood-pressure reading, without diagnosis of hypertension: Secondary | ICD-10-CM | POA: Diagnosis not present

## 2015-03-26 DIAGNOSIS — IMO0001 Reserved for inherently not codable concepts without codable children: Secondary | ICD-10-CM

## 2015-03-26 LAB — POCT CBC
Granulocyte percent: 64.1 %G (ref 37–80)
HCT, POC: 38 % (ref 37.7–47.9)
Hemoglobin: 12.2 g/dL (ref 12.2–16.2)
Lymph, poc: 2.4 (ref 0.6–3.4)
MCH, POC: 28.2 pg (ref 27–31.2)
MCHC: 32.1 g/dL (ref 31.8–35.4)
MCV: 87.9 fL (ref 80–97)
MPV: 7.4 fL (ref 0–99.8)
POC Granulocyte: 5.2 (ref 2–6.9)
POC LYMPH PERCENT: 29.6 %L (ref 10–50)
Platelet Count, POC: 432 10*3/uL — AB (ref 142–424)
RBC: 4.33 M/uL (ref 4.04–5.48)
RDW, POC: 14.6 %
WBC: 8.1 10*3/uL (ref 4.6–10.2)

## 2015-03-26 NOTE — Patient Instructions (Addendum)
Medicare Annual Wellness Visit  [facility] and the medical providers at [facility] strive to bring you the best medical care.  In doing so we not only want to address your current medical conditions and concerns but also to detect new conditions early and prevent illness, disease and health-related problems.    Medicare offers a yearly Wellness Visit which allows our clinical staff to assess your need for preventative services including immunizations, lifestyle education, counseling to decrease risk of preventable diseases and screening for fall risk and other medical concerns.    This visit is provided free of charge (no copay) for all Medicare recipients. The clinical pharmacists at [facility] have begun to conduct these Wellness Visits which will also include a thorough review of all your medications.    As you primary medical provider recommend that you make an appointment for your Annual Wellness Visit if you have not done so already this year.  You may set up this appointment before you leave today or you may call back ([phone]) and schedule an appointment.  Please make sure when you call that you mention that you are scheduling your Annual Wellness Visit with the clinical pharmacist so that the appointment may be made for the proper length of time.     Continue current medications. Continue good therapeutic lifestyle changes which include good diet and exercise. Fall precautions discussed with patient. If an FOBT was given today- please return it to our front desk. If you are over 65 years old - you may need Prevnar 7 or the adult Pneumonia vaccine.  Flu Shots are still available at our office. If you still haven't had one please call to set up a nurse visit to get one.   After your visit with [place] today you will receive a survey in the mail or online from [employer] regarding your care with [place]. Please take a moment to  fill this out. Your feedback is very important to [place] as you can help [place] better understand your patient needs as well as improve your experience and satisfaction. WE CARE ABOUT YOU!!!   Stay is active as possible and do not put yourself at risk for falling Do not climb Drink plenty of water and fluids Continue current medication

## 2015-03-26 NOTE — Progress Notes (Signed)
Subjective:    Patient ID: Kristy [name], female    DOB: [birth year], 81 y.o.   MRN: [id number]  HPI Pt here for follow up and management of chronic medical problems which includes elevated BP and atrial fibrillation. She is taking medications regularly. The patient is doing well with very few complaints. She is less active because her husband is less active. She is alert. She sees the cardiologist and just saw him recently and he will recheck her again in a year according to the patient. She will get lab work today. She refuses to have any more pelvic exams but may consider going back and getting a mammogram. Checks her breasts regularly.       Patient Active Problem List   Diagnosis Date Noted  . PAF (paroxysmal atrial fibrillation) 12/18/2013  . Cervical spondylosis without myelopathy 04/27/2013  . ABDOMINAL PAIN, UNSPECIFIED SITE 02/02/2011  . Palpitations 02/26/2010  . COLONIC POLYPS, HYPERPLASTIC 12/07/2007  . LIPOMA 12/07/2007  . OBESITY 12/07/2007  . HYPERTENSION 12/07/2007  . GERD 12/07/2007  . HIATAL HERNIA 12/07/2007  . DIVERTICULOSIS, COLON 12/07/2007  . FATTY LIVER DISEASE 12/07/2007  . ENDOMETRIAL POLYP 12/07/2007  . COMPLEX ENDOMETRIAL HYPERPLASIA WITHOUT ATYPIA 12/07/2007  . SOB 12/07/2007   Outpatient Encounter Prescriptions as of 03/26/2015  Medication Sig  . ALPRAZolam (XANAX) 0.25 MG tablet Take 1 tablet (0.25 mg total) by mouth daily as needed for anxiety.  . Cholecalciferol (VITAMIN D3) 2000 UNITS TABS Take by mouth daily.  Marland Kitchen estradiol (CLIMARA - DOSED IN MG/24 HR) 0.025 mg/24hr patch Place 0.025 mg onto the skin once a week.  . hydrochlorothiazide (HYDRODIURIL) 12.5 MG tablet Take 1 tablet (12.5 mg total) by mouth daily.  . metoprolol succinate (TOPROL XL) 25 MG 24 hr tablet Take 1 tablet (25 mg total) by mouth daily.  . Multiple Vitamin (MULTIVITAMIN) tablet Take 1 tablet by mouth daily.    Marland Kitchen omeprazole (PRILOSEC) 20 MG capsule TAKE ONE CAPSULE BY MOUTH  TWICE DAILY  . ONE TOUCH ULTRA TEST test strip USE TO TEST SUGAR TWICE DAILY  . Polyethyl Glycol-Propyl Glycol (SYSTANE OP) Apply 1-2 drops to eye 4 (four) times daily as needed (dry eyes).  . progesterone (PROMETRIUM) 200 MG capsule 12 tabs every four months  . sertraline (ZOLOFT) 50 MG tablet TAKE ONE TABLET BY MOUTH ONE TIME DAILY  . XARELTO 15 MG TABS tablet TAKE 1 TABLET (15 MG TOTAL) BY MOUTH DAILY.     Review of Systems  Constitutional: Negative.   HENT: Negative.   Eyes: Negative.   Respiratory: Negative.   Cardiovascular: Negative.   Gastrointestinal: Negative.   Endocrine: Negative.   Genitourinary: Negative.   Musculoskeletal: Negative.   Skin: Negative.   Allergic/Immunologic: Negative.   Neurological: Negative.   Hematological: Negative.   Psychiatric/Behavioral: Negative.        Objective:   Physical Exam  Constitutional: She is oriented to person, place, and time. She appears well-developed and well-nourished. No distress.  HENT:  Head: Normocephalic and atraumatic.  Right Ear: External ear normal.  Left Ear: External ear normal.  Nose: Nose normal.  Mouth/Throat: Oropharynx is clear and moist.  Eyes: Conjunctivae and EOM are normal. Pupils are equal, round, and reactive to light. Right eye exhibits no discharge. Left eye exhibits no discharge. No scleral icterus.  Neck: Normal range of motion. Neck supple. No thyromegaly present.  The neck has no adenopathy or bruits  Cardiovascular: Normal rate, regular rhythm, normal heart sounds and intact distal pulses.  Exam reveals no gallop and no friction rub.   No murmur heard. Heart is regular at 84/m  Pulmonary/Chest: Effort normal and breath sounds normal. No respiratory distress. She has no wheezes. She has no rales. She exhibits no tenderness.  Abdominal: Soft. Bowel sounds are normal. She exhibits no mass. There is no tenderness. There is no rebound and no guarding.  The abdomen is soft without masses  tenderness or organ enlargement  Musculoskeletal: Normal range of motion. She exhibits no edema or tenderness.  Lymphadenopathy:    She has no cervical adenopathy.  Neurological: She is alert and oriented to person, place, and time. She has normal reflexes. No cranial nerve deficit.  Skin: Skin is warm and dry. No rash noted.  Psychiatric: She has a normal mood and affect. Her behavior is normal. Judgment and thought content normal.  Nursing note and vitals reviewed.  BP 141/60 mmHg  Pulse 73  Temp(Src) 96.9 F (36.1 C) (Oral)  Ht $R'5\' 1"'Ac$  (1.549 m)  Wt 168 lb (76.204 kg)  BMI 31.76 kg/m2        Assessment & Plan:  1. Elevated BP -Watch sodium intake and continue with hydrochlorothiazide and metoprolol - POCT CBC - BMP8+EGFR - Hepatic function panel - Lipid panel  2. Fatty liver disease, nonalcoholic -Continue to work aggressively on weight with attention to diet and exercise - POCT CBC - Hepatic function panel  3. Paroxysmal atrial fibrillation -Continue regular follow-up with cardiology - POCT CBC  4. Vitamin D deficiency -Continue vitamin D current dosage can tinge and up on lab work being done today - Vit D  25 hydroxy (rtn osteoporosis monitoring)  5. Adjustment disorder with anxious mood -Take Xanax as needed  Patient Instructions                       Medicare Annual Wellness Visit  [facility] and the medical providers at [facility] strive to bring you the best medical care.  In doing so we not only want to address your current medical conditions and concerns but also to detect new conditions early and prevent illness, disease and health-related problems.    Medicare offers a yearly Wellness Visit which allows our clinical staff to assess your need for preventative services including immunizations, lifestyle education, counseling to decrease risk of preventable diseases and screening for fall risk and other medical concerns.    This  visit is provided free of charge (no copay) for all Medicare recipients. The clinical pharmacists at [facility] have begun to conduct these Wellness Visits which will also include a thorough review of all your medications.    As you primary medical provider recommend that you make an appointment for your Annual Wellness Visit if you have not done so already this year.  You may set up this appointment before you leave today or you may call back ([phone]) and schedule an appointment.  Please make sure when you call that you mention that you are scheduling your Annual Wellness Visit with the clinical pharmacist so that the appointment may be made for the proper length of time.     Continue current medications. Continue good therapeutic lifestyle changes which include good diet and exercise. Fall precautions discussed with patient. If an FOBT was given today- please return it to our front desk. If you are over 59 years old - you may need Prevnar 78 or the adult Pneumonia vaccine.  Flu Shots are still  available at our office. If you still haven't had one please call to set up a nurse visit to get one.   After your visit with [place] today you will receive a survey in the mail or online from [employer] regarding your care with [place]. Please take a moment to fill this out. Your feedback is very important to [place] as you can help [place] better understand your patient needs as well as improve your experience and satisfaction. WE CARE ABOUT YOU!!!   Stay is active as possible and do not put yourself at risk for falling Do not climb Drink plenty of water and fluids Continue current medication   Arrie Senate MD

## 2015-03-27 LAB — BMP8+EGFR
BUN/Creatinine Ratio: 17 (ref 11–26)
BUN: 16 mg/dL (ref 8–27)
CO2: 23 mmol/L (ref 18–29)
CREATININE: 0.93 mg/dL (ref 0.57–1.00)
Calcium: 9.7 mg/dL (ref 8.7–10.3)
Chloride: 104 mmol/L (ref 97–108)
GFR calc non Af Amer: 57 mL/min/{1.73_m2} — ABNORMAL LOW (ref >59)
GFR, EST AFRICAN AMERICAN: 65 mL/min/{1.73_m2} (ref >59)
Glucose: 107 mg/dL — ABNORMAL HIGH (ref 65–99)
Potassium: 4.1 mmol/L (ref 3.5–5.2)
Sodium: 142 mmol/L (ref 134–144)

## 2015-03-27 LAB — HEPATIC FUNCTION PANEL
ALBUMIN: 4.2 g/dL (ref 3.5–4.7)
ALK PHOS: 66 IU/L (ref 39–117)
ALT: 10 IU/L (ref 0–32)
AST: 21 IU/L (ref 0–40)
BILIRUBIN TOTAL: 0.3 mg/dL (ref 0.0–1.2)
BILIRUBIN, DIRECT: 0.1 mg/dL (ref 0.00–0.40)
TOTAL PROTEIN: 6.6 g/dL (ref 6.0–8.5)

## 2015-03-27 LAB — LIPID PANEL
CHOL/HDL RATIO: 3.4 ratio (ref 0.0–4.4)
Cholesterol, Total: 178 mg/dL (ref 100–199)
HDL: 53 mg/dL (ref >39)
LDL Calculated: 101 mg/dL — ABNORMAL HIGH (ref 0–99)
TRIGLYCERIDES: 120 mg/dL (ref 0–149)
VLDL CHOLESTEROL CAL: 24 mg/dL (ref 5–40)

## 2015-03-27 LAB — VITAMIN D 25 HYDROXY (VIT D DEFICIENCY, FRACTURES): Vit D, 25-Hydroxy: 49.4 ng/mL (ref 30.0–100.0)

## 2015-04-15 ENCOUNTER — Other Ambulatory Visit: Payer: Self-pay | Admitting: Cardiology

## 2015-04-16 DIAGNOSIS — H26492 Other secondary cataract, left eye: Secondary | ICD-10-CM | POA: Diagnosis not present

## 2015-04-16 DIAGNOSIS — Z961 Presence of intraocular lens: Secondary | ICD-10-CM | POA: Diagnosis not present

## 2015-04-16 DIAGNOSIS — H47013 Ischemic optic neuropathy, bilateral: Secondary | ICD-10-CM | POA: Diagnosis not present

## 2015-04-29 ENCOUNTER — Encounter: Payer: Self-pay | Admitting: Family Medicine

## 2015-04-29 ENCOUNTER — Ambulatory Visit (INDEPENDENT_AMBULATORY_CARE_PROVIDER_SITE_OTHER): Payer: Medicare Other | Admitting: Family Medicine

## 2015-04-29 ENCOUNTER — Ambulatory Visit (INDEPENDENT_AMBULATORY_CARE_PROVIDER_SITE_OTHER): Payer: Medicare Other

## 2015-04-29 VITALS — BP 134/81 | HR 81 | Temp 97.1°F | Ht 61.0 in | Wt 165.0 lb

## 2015-04-29 DIAGNOSIS — R059 Cough, unspecified: Secondary | ICD-10-CM

## 2015-04-29 DIAGNOSIS — R05 Cough: Secondary | ICD-10-CM

## 2015-04-29 DIAGNOSIS — J209 Acute bronchitis, unspecified: Secondary | ICD-10-CM

## 2015-04-29 DIAGNOSIS — J40 Bronchitis, not specified as acute or chronic: Secondary | ICD-10-CM | POA: Diagnosis not present

## 2015-04-29 LAB — POCT CBC
Granulocyte percent: 76.5 %G (ref 37–80)
HCT, POC: 43.5 % (ref 37.7–47.9)
Hemoglobin: 14 g/dL (ref 12.2–16.2)
LYMPH, POC: 1.9 (ref 0.6–3.4)
MCH: 28.2 pg (ref 27–31.2)
MCHC: 32.1 g/dL (ref 31.8–35.4)
MCV: 87.8 fL (ref 80–97)
MPV: 7.4 fL (ref 0–99.8)
PLATELET COUNT, POC: 507 10*3/uL — AB (ref 142–424)
POC Granulocyte: 7.7 — AB (ref 2–6.9)
POC LYMPH PERCENT: 19 %L (ref 10–50)
RBC: 4.96 M/uL (ref 4.04–5.48)
RDW, POC: 14.3 %
WBC: 10 10*3/uL (ref 4.6–10.2)

## 2015-04-29 MED ORDER — METHYLPREDNISOLONE ACETATE 80 MG/ML IJ SUSP
60.0000 mg | Freq: Once | INTRAMUSCULAR | Status: AC
  Administered 2015-04-29: 60 mg via INTRAMUSCULAR

## 2015-04-29 MED ORDER — AZITHROMYCIN 250 MG PO TABS: ORAL_TABLET | ORAL | Status: DC

## 2015-04-29 NOTE — Patient Instructions (Signed)
Drink plenty of fluids Take Mucinex maximum strength, blue and white in color, 1 twice daily with a large glass of water for cough and congestion Use saline nose spray Take antibiotic as directed

## 2015-04-29 NOTE — Progress Notes (Signed)
Subjective:    Patient ID: Kristy [name], female    DOB: 1928-12-03, 81 y.o.   MRN: [id number]  HPI Patient here today for cough, congestion and runny nose that started about 12 days ago. The patient says that her husband was sick initially but he is better. She continues to cough up green type sputum denies headaches or sore throat. She also denies having a fever.      Patient Active Problem List   Diagnosis Date Noted  . PAF (paroxysmal atrial fibrillation) 12/18/2013  . Cervical spondylosis without myelopathy 04/27/2013  . ABDOMINAL PAIN, UNSPECIFIED SITE 02/02/2011  . Palpitations 02/26/2010  . COLONIC POLYPS, HYPERPLASTIC 12/07/2007  . LIPOMA 12/07/2007  . OBESITY 12/07/2007  . HYPERTENSION 12/07/2007  . GERD 12/07/2007  . HIATAL HERNIA 12/07/2007  . DIVERTICULOSIS, COLON 12/07/2007  . FATTY LIVER DISEASE 12/07/2007  . ENDOMETRIAL POLYP 12/07/2007  . COMPLEX ENDOMETRIAL HYPERPLASIA WITHOUT ATYPIA 12/07/2007  . SOB 12/07/2007   Outpatient Encounter Prescriptions as of 04/29/2015  Medication Sig  . ALPRAZolam (XANAX) 0.25 MG tablet Take 1 tablet (0.25 mg total) by mouth daily as needed for anxiety.  . Cholecalciferol (VITAMIN D3) 2000 UNITS TABS Take by mouth daily.  Marland Kitchen estradiol (CLIMARA - DOSED IN MG/24 HR) 0.025 mg/24hr patch Place 0.025 mg onto the skin once a week.  . hydrochlorothiazide (HYDRODIURIL) 12.5 MG tablet Take 1 tablet (12.5 mg total) by mouth daily.  . metoprolol succinate (TOPROL-XL) 25 MG 24 hr tablet TAKE ONE TABLET BY MOUTH ONE TIME DAILY  . Multiple Vitamin (MULTIVITAMIN) tablet Take 1 tablet by mouth daily.    Marland Kitchen omeprazole (PRILOSEC) 20 MG capsule TAKE ONE CAPSULE BY MOUTH TWICE DAILY  . ONE TOUCH ULTRA TEST test strip USE TO TEST SUGAR TWICE DAILY  . Polyethyl Glycol-Propyl Glycol (SYSTANE OP) Apply 1-2 drops to eye 4 (four) times daily as needed (dry eyes).  . progesterone (PROMETRIUM) 200 MG capsule 12 tabs every four months  . sertraline (ZOLOFT)  50 MG tablet TAKE ONE TABLET BY MOUTH ONE TIME DAILY  . XARELTO 15 MG TABS tablet TAKE 1 TABLET (15 MG TOTAL) BY MOUTH DAILY.   No facility-administered encounter medications on file as of 04/29/2015.      Review of Systems  Constitutional: Negative.   HENT: Positive for congestion and postnasal drip.   Eyes: Negative.   Respiratory: Positive for cough.   Cardiovascular: Negative.   Gastrointestinal: Negative.   Endocrine: Negative.   Genitourinary: Negative.   Musculoskeletal: Negative.   Skin: Negative.   Allergic/Immunologic: Negative.   Neurological: Negative.   Hematological: Negative.   Psychiatric/Behavioral: Negative.        Objective:   Physical Exam  Constitutional: She is oriented to person, place, and time. She appears well-developed and well-nourished. No distress.  HENT:  Head: Normocephalic and atraumatic.  Mouth/Throat: Oropharynx is clear and moist. No oropharyngeal exudate.  The patient has bilateral ears cerumen and nasal congestion and pallor on the right side  Eyes: Conjunctivae and EOM are normal. Pupils are equal, round, and reactive to light. Right eye exhibits no discharge. No scleral icterus.  Neck: Normal range of motion. Neck supple. No JVD present. No thyromegaly present.  Cardiovascular: Normal rate, regular rhythm and normal heart sounds.   No murmur heard. Pulmonary/Chest: Effort normal. No respiratory distress. She has no wheezes. She has no rales.  There is bronchial congestion with coughing and deep breathing without rales  Musculoskeletal: Normal range of motion.  Lymphadenopathy:  She has no cervical adenopathy.  Neurological: She is alert and oriented to person, place, and time.  Skin: Skin is warm and dry. No rash noted.  Psychiatric: She has a normal mood and affect. Her behavior is normal. Judgment and thought content normal.  Nursing note and vitals reviewed.  BP 134/81 mmHg  Pulse 81  Temp(Src) 97.1 F (36.2 C) (Oral)  Ht  5\' 1"  (1.549 m)  Wt 165 lb (74.844 kg)  BMI 31.19 kg/m2  [facility] reading (PRIMARY) by  Dr. Karlene Einstein patient continues to have a large hiatal hernia. No active disease and the cardiopulmonary area.                                Results for orders placed or performed in visit on 04/29/15  POCT CBC  Result Value Ref Range   WBC 10.0 4.6 - 10.2 K/uL   Lymph, poc 1.9 0.6 - 3.4   POC LYMPH PERCENT 19.0 10 - 50 %L   POC Granulocyte 7.7 (A) 2 - 6.9   Granulocyte percent 76.5 37 - 80 %G   RBC 4.96 4.04 - 5.48 M/uL   Hemoglobin 14.0 12.2 - 16.2 g/dL   HCT, POC 43.5 37.7 - 47.9 %   MCV 87.8 80 - 97 fL   MCH, POC 28.2 27 - 31.2 pg   MCHC 32.1 31.8 - 35.4 g/dL   RDW, POC 14.3 %   Platelet Count, POC 507 (A) 142 - 424 K/uL   MPV 7.4 0 - 99.8 fL   The CBC was within normal limits and the patient will be made aware of this before she leaves office.        Assessment & Plan:  1. Cough -Take Mucinex as directed twice daily and use saline nose spray frequently through the day - POCT CBC - DG Chest 2 View; Future  2. Bronchitis with bronchospasm -Take and complete the antibiotic and drink plenty of fluids - azithromycin (ZITHROMAX) 250 MG tablet; 2 pills the first day then one daily for infection until completed  Dispense: 6 tablet; Refill: 0 - methylPREDNISolone acetate (DEPO-MEDROL) injection 60 mg; Inject 0.75 mLs (60 mg total) into the muscle once.  Patient Instructions  Drink plenty of fluids Take Mucinex maximum strength, blue and white in color, 1 twice daily with a large glass of water for cough and congestion Use saline nose spray Take antibiotic as directed   Arrie Senate MD

## 2015-04-30 ENCOUNTER — Telehealth: Payer: Self-pay | Admitting: Family Medicine

## 2015-04-30 NOTE — Telephone Encounter (Signed)
Pt was calling in regards to her husband Kristy [name], they found a growth on the left side of his face last night, she thinks it is a lipoma. Pt's husband scheduled with [facility] tomorrow at 10:30.

## 2015-05-20 ENCOUNTER — Other Ambulatory Visit: Payer: Self-pay | Admitting: Family Medicine

## 2015-05-27 ENCOUNTER — Encounter: Payer: Self-pay | Admitting: Family Medicine

## 2015-05-27 ENCOUNTER — Ambulatory Visit (INDEPENDENT_AMBULATORY_CARE_PROVIDER_SITE_OTHER): Payer: Medicare Other | Admitting: Family Medicine

## 2015-05-27 VITALS — BP 168/69 | HR 58 | Temp 97.4°F | Ht 61.0 in | Wt 165.0 lb

## 2015-05-27 DIAGNOSIS — T148 Other injury of unspecified body region: Secondary | ICD-10-CM

## 2015-05-27 DIAGNOSIS — L03012 Cellulitis of left finger: Secondary | ICD-10-CM

## 2015-05-27 DIAGNOSIS — W57XXXA Bitten or stung by nonvenomous insect and other nonvenomous arthropods, initial encounter: Secondary | ICD-10-CM

## 2015-05-27 MED ORDER — CEPHALEXIN 500 MG PO CAPS
500.0000 mg | ORAL_CAPSULE | Freq: Two times a day (BID) | ORAL | Status: DC
Start: 2015-05-27 — End: 2015-07-30

## 2015-05-27 NOTE — Patient Instructions (Addendum)
Antibiotic antibiotic as directed Keep the area cleansed with warm soapy water Take Benadryl 25 mg 3 or 4 times daily as needed for itching and swelling Call us if this is not better in 3-4 days

## 2015-05-27 NOTE — Progress Notes (Addendum)
Subjective:    Patient ID: Kristy [name], female    DOB: 07-03-1928, 81 y.o.   MRN: [id number]  HPI Pt here today for insect bite to finger. The patient called concerning what she thinks is a bite to the finger and we felt like she should come in to be rechecked and she has come in for me to look at this. She does not recall a specific insect bite. The finger has been swollen and tender and she was concerned if she needed an antibiotic or not.        Patient Active Problem List   Diagnosis Date Noted  . PAF (paroxysmal atrial fibrillation) 12/18/2013  . Cervical spondylosis without myelopathy 04/27/2013  . ABDOMINAL PAIN, UNSPECIFIED SITE 02/02/2011  . Palpitations 02/26/2010  . COLONIC POLYPS, HYPERPLASTIC 12/07/2007  . LIPOMA 12/07/2007  . OBESITY 12/07/2007  . HYPERTENSION 12/07/2007  . GERD 12/07/2007  . HIATAL HERNIA 12/07/2007  . DIVERTICULOSIS, COLON 12/07/2007  . FATTY LIVER DISEASE 12/07/2007  . ENDOMETRIAL POLYP 12/07/2007  . COMPLEX ENDOMETRIAL HYPERPLASIA WITHOUT ATYPIA 12/07/2007  . SOB 12/07/2007   Outpatient Encounter Prescriptions as of 05/27/2015  Medication Sig  . ALPRAZolam (XANAX) 0.25 MG tablet Take 1 tablet (0.25 mg total) by mouth daily as needed for anxiety.  . Cholecalciferol (VITAMIN D3) 2000 UNITS TABS Take by mouth daily.  Marland Kitchen estradiol (CLIMARA - DOSED IN MG/24 HR) 0.025 mg/24hr patch Place 0.025 mg onto the skin once a week.  . hydrochlorothiazide (HYDRODIURIL) 12.5 MG tablet Take 1 tablet (12.5 mg total) by mouth daily.  . metoprolol succinate (TOPROL-XL) 25 MG 24 hr tablet TAKE ONE TABLET BY MOUTH ONE TIME DAILY  . Multiple Vitamin (MULTIVITAMIN) tablet Take 1 tablet by mouth daily.    Marland Kitchen omeprazole (PRILOSEC) 20 MG capsule TAKE ONE CAPSULE BY MOUTH TWICE DAILY  . ONE TOUCH ULTRA TEST test strip USE TO TEST SUGAR TWICE DAILY  . Polyethyl Glycol-Propyl Glycol (SYSTANE OP) Apply 1-2 drops to eye 4 (four) times daily as needed (dry eyes).  .  progesterone (PROMETRIUM) 200 MG capsule 12 tabs every four months  . sertraline (ZOLOFT) 50 MG tablet TAKE ONE TABLET BY MOUTH ONE TIME DAILY  . XARELTO 15 MG TABS tablet TAKE 1 TABLET (15 MG TOTAL) BY MOUTH DAILY.  . [DISCONTINUED] azithromycin (ZITHROMAX) 250 MG tablet 2 pills the first day then one daily for infection until completed   No facility-administered encounter medications on file as of 05/27/2015.     Review of Systems  Constitutional: Negative.   HENT: Negative.   Eyes: Negative.   Respiratory: Negative.   Cardiovascular: Negative.   Gastrointestinal: Negative.   Endocrine: Negative.   Genitourinary: Negative.   Musculoskeletal: Negative.   Skin: Negative.        Insect bite to finger  Allergic/Immunologic: Negative.   Neurological: Negative.   Hematological: Negative.   Psychiatric/Behavioral: Negative.        Objective:   Physical Exam  Constitutional: She is oriented to person, place, and time. She appears well-developed and well-nourished. No distress.  Musculoskeletal: Normal range of motion.  Neurological: She is alert and oriented to person, place, and time.  Skin: Skin is warm and dry. No rash noted. There is erythema. No pallor.  There is minimal erythema of the DIP joint of the index finger of the left hand and there is a small raised area over the joint that appears to be in some type of bite. There is slight swelling.  Psychiatric:  She has a normal mood and affect. Her behavior is normal. Judgment and thought content normal.  Nursing note and vitals reviewed.  BP 168/69 mmHg  Pulse 58  Temp(Src) 97.4 F (36.3 C) (Oral)  Ht 5\' 1"  (1.549 m)  Wt 165 lb (74.844 kg)  BMI 31.19 kg/m2        Assessment & Plan:  1. Insect bite -Take antibiotic twice daily until completed -Take Benadryl 25 mg 3 or 4 times daily as needed for itching - cephALEXin (KEFLEX) 500 MG capsule; Take 1 capsule (500 mg total) by mouth 2 (two) times daily.  Dispense: 20  capsule; Refill: 0  2. Cellulitis of finger of left hand -Take antibiotic as directed - cephALEXin (KEFLEX) 500 MG capsule; Take 1 capsule (500 mg total) by mouth 2 (two) times daily.  Dispense: 20 capsule; Refill: 0  Patient Instructions  Antibiotic antibiotic as directed Keep the area cleansed with warm soapy water Take Benadryl 25 mg 3 or 4 times daily as needed for itching and swelling Call us if this is not better in 3-4 days   Arrie Senate MD

## 2015-06-11 ENCOUNTER — Other Ambulatory Visit: Payer: Self-pay

## 2015-06-11 MED ORDER — HYDROCHLOROTHIAZIDE 12.5 MG PO TABS: 12.5000 mg | ORAL_TABLET | Freq: Every day | ORAL | Status: DC

## 2015-06-19 DIAGNOSIS — H04123 Dry eye syndrome of bilateral lacrimal glands: Secondary | ICD-10-CM | POA: Diagnosis not present

## 2015-06-19 DIAGNOSIS — H47013 Ischemic optic neuropathy, bilateral: Secondary | ICD-10-CM | POA: Diagnosis not present

## 2015-07-04 ENCOUNTER — Other Ambulatory Visit: Payer: Self-pay | Admitting: *Deleted

## 2015-07-04 MED ORDER — PROGESTERONE MICRONIZED 200 MG PO CAPS: ORAL_CAPSULE | ORAL | Status: DC

## 2015-07-11 ENCOUNTER — Ambulatory Visit: Payer: Medicare Other

## 2015-07-16 ENCOUNTER — Other Ambulatory Visit: Payer: Self-pay | Admitting: Family Medicine

## 2015-07-26 ENCOUNTER — Emergency Department (HOSPITAL_COMMUNITY)
Admission: EM | Admit: 2015-07-26 | Discharge: 2015-07-26 | Disposition: A | Discharge status: Home / Self Care | Payer: Medicare Other | Attending: Emergency Medicine | Admitting: Emergency Medicine

## 2015-07-26 ENCOUNTER — Emergency Department (HOSPITAL_COMMUNITY): Payer: Medicare Other

## 2015-07-26 ENCOUNTER — Encounter (HOSPITAL_COMMUNITY): Payer: Self-pay

## 2015-07-26 DIAGNOSIS — Z792 Long term (current) use of antibiotics: Secondary | ICD-10-CM | POA: Diagnosis not present

## 2015-07-26 DIAGNOSIS — M25532 Pain in left wrist: Secondary | ICD-10-CM | POA: Diagnosis not present

## 2015-07-26 DIAGNOSIS — K219 Gastro-esophageal reflux disease without esophagitis: Secondary | ICD-10-CM | POA: Diagnosis not present

## 2015-07-26 DIAGNOSIS — Y9389 Activity, other specified: Secondary | ICD-10-CM | POA: Insufficient documentation

## 2015-07-26 DIAGNOSIS — E669 Obesity, unspecified: Secondary | ICD-10-CM | POA: Diagnosis not present

## 2015-07-26 DIAGNOSIS — S0292XA Unspecified fracture of facial bones, initial encounter for closed fracture: Secondary | ICD-10-CM | POA: Insufficient documentation

## 2015-07-26 DIAGNOSIS — S52592A Other fractures of lower end of left radius, initial encounter for closed fracture: Secondary | ICD-10-CM | POA: Diagnosis not present

## 2015-07-26 DIAGNOSIS — T148 Other injury of unspecified body region: Secondary | ICD-10-CM | POA: Diagnosis not present

## 2015-07-26 DIAGNOSIS — Y998 Other external cause status: Secondary | ICD-10-CM | POA: Diagnosis not present

## 2015-07-26 DIAGNOSIS — S199XXA Unspecified injury of neck, initial encounter: Secondary | ICD-10-CM | POA: Diagnosis not present

## 2015-07-26 DIAGNOSIS — M21932 Unspecified acquired deformity of left forearm: Secondary | ICD-10-CM

## 2015-07-26 DIAGNOSIS — S5292XA Unspecified fracture of left forearm, initial encounter for closed fracture: Secondary | ICD-10-CM

## 2015-07-26 DIAGNOSIS — S52502A Unspecified fracture of the lower end of left radius, initial encounter for closed fracture: Secondary | ICD-10-CM | POA: Diagnosis not present

## 2015-07-26 DIAGNOSIS — I10 Essential (primary) hypertension: Secondary | ICD-10-CM | POA: Insufficient documentation

## 2015-07-26 DIAGNOSIS — Z86010 Personal history of colonic polyps: Secondary | ICD-10-CM | POA: Diagnosis not present

## 2015-07-26 DIAGNOSIS — W01198A Fall on same level from slipping, tripping and stumbling with subsequent striking against other object, initial encounter: Secondary | ICD-10-CM | POA: Diagnosis not present

## 2015-07-26 DIAGNOSIS — S01112A Laceration without foreign body of left eyelid and periocular area, initial encounter: Secondary | ICD-10-CM | POA: Insufficient documentation

## 2015-07-26 DIAGNOSIS — Z79899 Other long term (current) drug therapy: Secondary | ICD-10-CM | POA: Insufficient documentation

## 2015-07-26 DIAGNOSIS — M542 Cervicalgia: Secondary | ICD-10-CM | POA: Diagnosis not present

## 2015-07-26 DIAGNOSIS — S0990XA Unspecified injury of head, initial encounter: Secondary | ICD-10-CM | POA: Diagnosis not present

## 2015-07-26 DIAGNOSIS — S52612A Displaced fracture of left ulna styloid process, initial encounter for closed fracture: Secondary | ICD-10-CM | POA: Diagnosis not present

## 2015-07-26 DIAGNOSIS — S0181XA Laceration without foreign body of other part of head, initial encounter: Secondary | ICD-10-CM | POA: Diagnosis not present

## 2015-07-26 DIAGNOSIS — Z87891 Personal history of nicotine dependence: Secondary | ICD-10-CM | POA: Diagnosis not present

## 2015-07-26 DIAGNOSIS — R51 Headache: Secondary | ICD-10-CM | POA: Diagnosis not present

## 2015-07-26 DIAGNOSIS — Y9289 Other specified places as the place of occurrence of the external cause: Secondary | ICD-10-CM | POA: Insufficient documentation

## 2015-07-26 LAB — CBC WITH DIFFERENTIAL/PLATELET
Basophils Absolute: 0 10*3/uL (ref 0.0–0.1)
Basophils Relative: 0 % (ref 0–1)
Eosinophils Absolute: 0 10*3/uL (ref 0.0–0.7)
Eosinophils Relative: 0 % (ref 0–5)
HCT: 41.5 % (ref 36.0–46.0)
Hemoglobin: 13.9 g/dL (ref 12.0–15.0)
Lymphocytes Relative: 12 % (ref 12–46)
Lymphs Abs: 1.1 10*3/uL (ref 0.7–4.0)
MCH: 29.6 pg (ref 26.0–34.0)
MCHC: 33.5 g/dL (ref 30.0–36.0)
MCV: 88.3 fL (ref 78.0–100.0)
Monocytes Absolute: 0.4 10*3/uL (ref 0.1–1.0)
Monocytes Relative: 5 % (ref 3–12)
Neutro Abs: 7.5 10*3/uL (ref 1.7–7.7)
Neutrophils Relative %: 83 % — ABNORMAL HIGH (ref 43–77)
Platelets: 401 10*3/uL — ABNORMAL HIGH (ref 150–400)
RBC: 4.7 MIL/uL (ref 3.87–5.11)
RDW: 13.7 % (ref 11.5–15.5)
WBC: 9.1 10*3/uL (ref 4.0–10.5)

## 2015-07-26 LAB — BASIC METABOLIC PANEL
Anion gap: 15 (ref 5–15)
BUN: 15 mg/dL (ref 6–20)
CO2: 22 mmol/L (ref 22–32)
Calcium: 9.8 mg/dL (ref 8.9–10.3)
Chloride: 104 mmol/L (ref 101–111)
Creatinine, Ser: 1.12 mg/dL — ABNORMAL HIGH (ref 0.44–1.00)
GFR calc Af Amer: 51 mL/min — ABNORMAL LOW (ref >60)
GFR calc non Af Amer: 44 mL/min — ABNORMAL LOW (ref >60)
Glucose, Bld: 167 mg/dL — ABNORMAL HIGH (ref 65–99)
Potassium: 3.7 mmol/L (ref 3.5–5.1)
Sodium: 141 mmol/L (ref 135–145)

## 2015-07-26 LAB — CBG MONITORING, ED: Glucose-Capillary: 132 mg/dL — ABNORMAL HIGH (ref 65–99)

## 2015-07-26 MED ORDER — LIDOCAINE-EPINEPHRINE 1 %-1:100000 IJ SOLN
10.0000 mL | Freq: Once | INTRAMUSCULAR | Status: DC
  Filled 2015-07-26: qty 1

## 2015-07-26 MED ORDER — LIDOCAINE-EPINEPHRINE 1 %-1:100000 IJ SOLN
10.0000 mL | Freq: Once | INTRAMUSCULAR | Status: AC
  Administered 2015-07-26: 10 mL
  Filled 2015-07-26: qty 1

## 2015-07-26 MED ORDER — ONDANSETRON HCL 4 MG/2ML IJ SOLN
4.0000 mg | Freq: Once | INTRAMUSCULAR | Status: AC
  Administered 2015-07-26: 4 mg via INTRAVENOUS
  Filled 2015-07-26: qty 2

## 2015-07-26 MED ORDER — HYDROCODONE-ACETAMINOPHEN 5-325 MG PO TABS: 1.0000 | ORAL_TABLET | ORAL | Status: DC | PRN

## 2015-07-26 MED ORDER — SODIUM CHLORIDE 0.9 % IV SOLN
INTRAVENOUS | Status: DC
  Administered 2015-07-26: 12:00:00 via INTRAVENOUS

## 2015-07-26 MED ORDER — FENTANYL CITRATE (PF) 100 MCG/2ML IJ SOLN
50.0000 ug | INTRAMUSCULAR | Status: AC | PRN
  Administered 2015-07-26 (×2): 50 ug via INTRAVENOUS
  Filled 2015-07-26 (×2): qty 2

## 2015-07-26 MED ORDER — ONDANSETRON HCL 4 MG PO TABS: 4.0000 mg | ORAL_TABLET | Freq: Four times a day (QID) | ORAL | Status: DC

## 2015-07-26 NOTE — ED Provider Notes (Signed)
CSN: [id number]     Arrival date & time 07/26/15  1140 History   First MD Initiated Contact with Patient 07/26/15 1150     Chief Complaint  Patient presents with  . Fall     (Consider location/radiation/quality/duration/timing/severity/associated sxs/prior Treatment) HPI   81 year old female presenting after a fall. Mechanical fall after she slipped on some water in her basement. She struck her head. She is not sure if she lost consciousness or not. She is on xarelto for history of atrial fibrillation. She is complaining of a headache and left wrist pain. Feels nauseated. No vomiting. No acute visual changes. No acute numbness, tingling or loss of strength. Baseline mental status per family at bedside.   Past Medical History  Diagnosis Date  . Hypertension   . Obesity   . Colon polyps   . Endometrial polyp   . SOB (shortness of breath)   . Diverticulosis   . GERD (gastroesophageal reflux disease)   . Hiatal hernia   . Endometrial hyperplasia without atypia, simple   . Hyperplastic colon polyp   . Lipoma   . Fatty liver   . A-fib    Past Surgical History  Procedure Laterality Date  . Cholecystectomy  03/2002  . Umbilical hernia repair  09/2002  . Knee surgery    . Optic neuritis    . Dilation and curettage of uterus      x 2  . Cataract extraction    . Breast biopsy    . Hernia repair     Family History  Problem Relation Age of Onset  . Diabetes Mother   . Heart disease Mother   . Diabetes Sister   . Diabetes Brother   . Heart disease Brother   . Colon cancer Neg Hx    Social History  Substance Use Topics  . Smoking status: Former Smoker    Quit date: 12/07/1982  . Smokeless tobacco: Never Used  . Alcohol Use: No   OB History    No data available     Review of Systems  All systems reviewed and negative, other than as noted in HPI.   Allergies  Codeine and Tramadol  Home Medications   Prior to Admission medications   Medication Sig Start Date End  Date Taking? Authorizing Provider  ALPRAZolam (XANAX) 0.25 MG tablet Take 1 tablet (0.25 mg total) by mouth daily as needed for anxiety. 11/21/14   Chipper Herb, MD  cephALEXin (KEFLEX) 500 MG capsule Take 1 capsule (500 mg total) by mouth 2 (two) times daily. 05/27/15   Chipper Herb, MD  Cholecalciferol (VITAMIN D3) 2000 UNITS TABS Take by mouth daily.    Historical Provider, MD  estradiol (CLIMARA - DOSED IN MG/24 HR) 0.025 mg/24hr patch Place 0.025 mg onto the skin once a week.    Historical Provider, MD  hydrochlorothiazide (HYDRODIURIL) 12.5 MG tablet Take 1 tablet (12.5 mg total) by mouth daily. 06/11/15   Minus Breeding, MD  metoprolol succinate (TOPROL-XL) 25 MG 24 hr tablet TAKE ONE TABLET BY MOUTH ONE TIME DAILY 04/15/15   Minus Breeding, MD  Multiple Vitamin (MULTIVITAMIN) tablet Take 1 tablet by mouth daily.      Historical Provider, MD  omeprazole (PRILOSEC) 20 MG capsule TAKE ONE CAPSULE BY MOUTH TWICE DAILY 11/03/14   Chipper Herb, MD  ONE [facility] ULTRA TEST test strip USE TO TEST SUGAR TWICE A DAY AS INSTRUCTED 07/17/15   Chipper Herb, MD  Polyethyl Glycol-Propyl Glycol (SYSTANE OP)  Apply 1-2 drops to eye 4 (four) times daily as needed (dry eyes).    Historical Provider, MD  progesterone (PROMETRIUM) 200 MG capsule 12 tabs every four months 07/04/15   Chipper Herb, MD  sertraline (ZOLOFT) 50 MG tablet TAKE ONE TABLET BY MOUTH ONE TIME DAILY 05/21/15   Chipper Herb, MD  XARELTO 15 MG TABS tablet TAKE 1 TABLET (15 MG TOTAL) BY MOUTH DAILY. 12/30/14   Minus Breeding, MD   BP 197/83 mmHg  Pulse 92  Temp(Src) 97.9 F (36.6 C)  Resp 24  Ht 5\' 2"  (1.575 m)  Wt 165 lb (74.844 kg)  BMI 30.17 kg/m2  SpO2 97% Physical Exam  Constitutional: She appears well-developed and well-nourished. No distress.  HENT:  L periorbital ecchymosis and swelling with approximately 2 cm laceration just above the left eyebrow. No active bleeding. Dried blood noted in the left nare.   Eyes: Conjunctivae  are normal. Right eye exhibits no discharge. Left eye exhibits no discharge.  Neck: Neck supple.  Cardiovascular: Normal rate, regular rhythm and normal heart sounds.  Exam reveals no gallop and no friction rub.   No murmur heard. Pulmonary/Chest: Effort normal and breath sounds normal. No respiratory distress.  Abdominal: Soft. She exhibits no distension. There is no tenderness.  Musculoskeletal: She exhibits no edema or tenderness.  TTP/deformity L wrist. Closed injury. NVI distally. No body tenderness or apparent pain with ROM of large joints elsewhere. No midline spinal tenderness.   Neurological: She is alert.  Skin: Skin is warm and dry.  Psychiatric: She has a normal mood and affect. Her behavior is normal. Thought content normal.  Nursing note and vitals reviewed.   ED Course  Procedures (including critical care time)  LACERATION REPAIR Performed by: Virgel Manifold Authorized by: Virgel Manifold Consent: Verbal consent obtained. Risks and benefits: risks, benefits and alternatives were discussed Consent given by: patient Patient identity confirmed: provided demographic data Prepped and Draped in normal sterile fashion Wound explored  Laceration Location: Over L superior orbital rim  Laceration Length: 2 cm  No Foreign Bodies seen or palpated  Anesthesia: local infiltration  Local anesthetic: lidocaine 1% w/ epi Anesthetic total: 1 ml  Irrigation method: syringe Amount of cleaning: standard  Skin closure: 6-0 prolene  Number of sutures: 4  Technique: simple interrupted   Patient tolerance: Patient tolerated the procedure well with no immediate complications. Labs Review Labs Reviewed  CBC WITH DIFFERENTIAL/PLATELET - Abnormal; Notable for the following:    Platelets 401 (*)    Neutrophils Relative % 83 (*)    All other components within normal limits  BASIC METABOLIC PANEL - Abnormal; Notable for the following:    Glucose, Bld 167 (*)    Creatinine, Ser  1.12 (*)    GFR calc non Af Amer 44 (*)    GFR calc Af Amer 51 (*)    All other components within normal limits  CBG MONITORING, ED - Abnormal; Notable for the following:    Glucose-Capillary 132 (*)    All other components within normal limits    Imaging Review No results found.   Dg Wrist 2 Views Left  07/26/2015   CLINICAL DATA:  Deformity, wrist acquired, left.  EXAM: LEFT WRIST - 2 VIEW  COMPARISON:  None.  FINDINGS: Comminuted displaced fracture of the distal radius. The fracture extends into the radiocarpal joint. The distal radius is displaced dorsally. There is also a comminuted fracture involving the ulnar styloid. Soft tissue swelling. The proximal radius  is overriding the distal radial fragment. Carpal bones are intact.  IMPRESSION: Comminuted and displaced fracture of the distal radius with intra-articular involvement.  Ulnar styloid fracture.   Electronically Signed   By: Markus Daft M.D.   On: 07/26/2015 13:10   Ct Head Wo Contrast  07/26/2015   CLINICAL DATA:  Slipped and fell in basement this morning. Head and neck injury and pain. Initial encounter.  EXAM: CT HEAD WITHOUT CONTRAST  CT CERVICAL SPINE WITHOUT CONTRAST  TECHNIQUE: Multidetector CT imaging of the head and cervical spine was performed following the standard protocol without intravenous contrast. Multiplanar CT image reconstructions of the cervical spine were also generated.  COMPARISON:  None.  FINDINGS: CT HEAD FINDINGS  No evidence of intracranial hemorrhage, brain edema, or other signs of acute infarction. No evidence of intracranial mass lesion or mass effect.  No abnormal extraaxial fluid collections identified. Ventricles are normal in size. No skull fracture identified.  Left periorbital and preseptal soft tissue hematoma seen. Fractures are seen involving the lateral wall and floor of left orbit as well as the anterior, medial, and lateral walls of the left maxillary sinus.  CT CERVICAL SPINE FINDINGS  No evidence  of acute fracture, subluxation, or prevertebral soft tissue swelling.  Mild to moderate degenerative disc disease is seen from levels of C3-C7. Moderate to severe facet DJD is seen bilaterally at multiple cervical levels. Advanced atlantoaxial degenerative changes also seen.  IMPRESSION: No acute intracranial abnormality.  No evidence of acute cervical spine fracture or subluxation. Degenerative spondylosis, as described above.  Left orbital and maxillary sinus fractures noted. Consider maxillofacial CT for further evaluation if clinically warranted.   Electronically Signed   By: Earle Gell M.D.   On: 07/26/2015 13:43   Ct Cervical Spine Wo Contrast  07/26/2015   CLINICAL DATA:  Slipped and fell in basement this morning. Head and neck injury and pain. Initial encounter.  EXAM: CT HEAD WITHOUT CONTRAST  CT CERVICAL SPINE WITHOUT CONTRAST  TECHNIQUE: Multidetector CT imaging of the head and cervical spine was performed following the standard protocol without intravenous contrast. Multiplanar CT image reconstructions of the cervical spine were also generated.  COMPARISON:  None.  FINDINGS: CT HEAD FINDINGS  No evidence of intracranial hemorrhage, brain edema, or other signs of acute infarction. No evidence of intracranial mass lesion or mass effect.  No abnormal extraaxial fluid collections identified. Ventricles are normal in size. No skull fracture identified.  Left periorbital and preseptal soft tissue hematoma seen. Fractures are seen involving the lateral wall and floor of left orbit as well as the anterior, medial, and lateral walls of the left maxillary sinus.  CT CERVICAL SPINE FINDINGS  No evidence of acute fracture, subluxation, or prevertebral soft tissue swelling.  Mild to moderate degenerative disc disease is seen from levels of C3-C7. Moderate to severe facet DJD is seen bilaterally at multiple cervical levels. Advanced atlantoaxial degenerative changes also seen.  IMPRESSION: No acute intracranial  abnormality.  No evidence of acute cervical spine fracture or subluxation. Degenerative spondylosis, as described above.  Left orbital and maxillary sinus fractures noted. Consider maxillofacial CT for further evaluation if clinically warranted.   Electronically Signed   By: Earle Gell M.D.   On: 07/26/2015 13:43   I have personally reviewed and evaluated these images and lab results as part of my medical decision-making.   EKG Interpretation None      MDM   Final diagnoses:  Closed fracture of left radius, initial encounter  Facial laceration, initial encounter  Facial bone fracture, closed, initial encounter    86yf with mechanical fall. Closed distal L wrist fx. Appreciate hand surgical consultation. FU per Dr Bertis Ruddy recommendations. Small facial lac repaired. Facial fx's. FU as needed with ENT. Nonfocal neuro exam. Feeling better. Offered admission for observation, but would rather go home and i feeel she is safe to do so. Return precautions discussed.     Virgel Manifold, MD 07/31/15 1410

## 2015-07-26 NOTE — Progress Notes (Signed)
Orthopedic Tech Progress Note Patient Details:  Kristy [name] [birth year] [id number]  Ortho Devices Type of Ortho Device: Ace wrap, Arm sling, Sugartong splint, Finger trap Ortho Device/Splint Interventions: Application   Cammer, Theodoro Parma 07/26/2015, 4:26 PM

## 2015-07-26 NOTE — Consult Note (Signed)
Reason for Consult:left distal radius fracture Referring Physician: [name] is an 81 y.o. female.  HPI: s/p foosh with displaced left distal radius fracture  Past Medical History  Diagnosis Date  . Hypertension   . Obesity   . Colon polyps   . Endometrial polyp   . SOB (shortness of breath)   . Diverticulosis   . GERD (gastroesophageal reflux disease)   . Hiatal hernia   . Endometrial hyperplasia without atypia, simple   . Hyperplastic colon polyp   . Lipoma   . Fatty liver   . A-fib     Past Surgical History  Procedure Laterality Date  . Cholecystectomy  03/2002  . Umbilical hernia repair  09/2002  . Knee surgery    . Optic neuritis    . Dilation and curettage of uterus      x 2  . Cataract extraction    . Breast biopsy    . Hernia repair      Family History  Problem Relation Age of Onset  . Diabetes Mother   . Heart disease Mother   . Diabetes Sister   . Diabetes Brother   . Heart disease Brother   . Colon cancer Neg Hx     Social History:  reports that she quit smoking about 32 years ago. She has never used smokeless tobacco. She reports that she does not drink alcohol or use illicit drugs.  Allergies:  Allergies  Allergen Reactions  . Codeine Nausea Only  . Tramadol Nausea Only    Medications:  Prior to Admission:  (Not in a hospital admission) Scheduled: . lidocaine-EPINEPHrine  10 mL Infiltration Once    Results for orders placed or performed during the hospital encounter of 07/26/15 (from the past 48 hour(s))  CBC with Differential     Status: Abnormal   Collection Time: 07/26/15 12:02 PM  Result Value Ref Range   WBC 9.1 4.0 - 10.5 K/uL   RBC 4.70 3.87 - 5.11 MIL/uL   Hemoglobin 13.9 12.0 - 15.0 g/dL   HCT 41.5 36.0 - 46.0 %   MCV 88.3 78.0 - 100.0 fL   MCH 29.6 26.0 - 34.0 pg   MCHC 33.5 30.0 - 36.0 g/dL   RDW 13.7 11.5 - 15.5 %   Platelets 401 (H) 150 - 400 K/uL   Neutrophils Relative % 83 (H) 43 - 77 %   Neutro Abs 7.5  1.7 - 7.7 K/uL   Lymphocytes Relative 12 12 - 46 %   Lymphs Abs 1.1 0.7 - 4.0 K/uL   Monocytes Relative 5 3 - 12 %   Monocytes Absolute 0.4 0.1 - 1.0 K/uL   Eosinophils Relative 0 0 - 5 %   Eosinophils Absolute 0.0 0.0 - 0.7 K/uL   Basophils Relative 0 0 - 1 %   Basophils Absolute 0.0 0.0 - 0.1 K/uL  Basic metabolic panel     Status: Abnormal   Collection Time: 07/26/15 12:02 PM  Result Value Ref Range   Sodium 141 135 - 145 mmol/L   Potassium 3.7 3.5 - 5.1 mmol/L   Chloride 104 101 - 111 mmol/L   CO2 22 22 - 32 mmol/L   Glucose, Bld 167 (H) 65 - 99 mg/dL   BUN 15 6 - 20 mg/dL   Creatinine, Ser 1.12 (H) 0.44 - 1.00 mg/dL   Calcium 9.8 8.9 - 10.3 mg/dL   GFR calc non Af Amer 44 (L) >60 mL/min   GFR calc Af  Amer 51 (L) >60 mL/min    Comment: (NOTE) The eGFR has been calculated using the CKD EPI equation. This calculation has not been validated in all clinical situations. eGFR's persistently <60 mL/min signify possible Chronic Kidney Disease.    Anion gap 15 5 - 15    Dg Wrist 2 Views Left  07/26/2015   CLINICAL DATA:  Deformity, wrist acquired, left.  EXAM: LEFT WRIST - 2 VIEW  COMPARISON:  None.  FINDINGS: Comminuted displaced fracture of the distal radius. The fracture extends into the radiocarpal joint. The distal radius is displaced dorsally. There is also a comminuted fracture involving the ulnar styloid. Soft tissue swelling. The proximal radius is overriding the distal radial fragment. Carpal bones are intact.  IMPRESSION: Comminuted and displaced fracture of the distal radius with intra-articular involvement.  Ulnar styloid fracture.   Electronically Signed   By: Markus Daft M.D.   On: 07/26/2015 13:10   Ct Head Wo Contrast  07/26/2015   CLINICAL DATA:  Slipped and fell in basement this morning. Head and neck injury and pain. Initial encounter.  EXAM: CT HEAD WITHOUT CONTRAST  CT CERVICAL SPINE WITHOUT CONTRAST  TECHNIQUE: Multidetector CT imaging of the head and cervical  spine was performed following the standard protocol without intravenous contrast. Multiplanar CT image reconstructions of the cervical spine were also generated.  COMPARISON:  None.  FINDINGS: CT HEAD FINDINGS  No evidence of intracranial hemorrhage, brain edema, or other signs of acute infarction. No evidence of intracranial mass lesion or mass effect.  No abnormal extraaxial fluid collections identified. Ventricles are normal in size. No skull fracture identified.  Left periorbital and preseptal soft tissue hematoma seen. Fractures are seen involving the lateral wall and floor of left orbit as well as the anterior, medial, and lateral walls of the left maxillary sinus.  CT CERVICAL SPINE FINDINGS  No evidence of acute fracture, subluxation, or prevertebral soft tissue swelling.  Mild to moderate degenerative disc disease is seen from levels of C3-C7. Moderate to severe facet DJD is seen bilaterally at multiple cervical levels. Advanced atlantoaxial degenerative changes also seen.  IMPRESSION: No acute intracranial abnormality.  No evidence of acute cervical spine fracture or subluxation. Degenerative spondylosis, as described above.  Left orbital and maxillary sinus fractures noted. Consider maxillofacial CT for further evaluation if clinically warranted.   Electronically Signed   By: Earle Gell M.D.   On: 07/26/2015 13:43   Ct Cervical Spine Wo Contrast  07/26/2015   CLINICAL DATA:  Slipped and fell in basement this morning. Head and neck injury and pain. Initial encounter.  EXAM: CT HEAD WITHOUT CONTRAST  CT CERVICAL SPINE WITHOUT CONTRAST  TECHNIQUE: Multidetector CT imaging of the head and cervical spine was performed following the standard protocol without intravenous contrast. Multiplanar CT image reconstructions of the cervical spine were also generated.  COMPARISON:  None.  FINDINGS: CT HEAD FINDINGS  No evidence of intracranial hemorrhage, brain edema, or other signs of acute infarction. No evidence  of intracranial mass lesion or mass effect.  No abnormal extraaxial fluid collections identified. Ventricles are normal in size. No skull fracture identified.  Left periorbital and preseptal soft tissue hematoma seen. Fractures are seen involving the lateral wall and floor of left orbit as well as the anterior, medial, and lateral walls of the left maxillary sinus.  CT CERVICAL SPINE FINDINGS  No evidence of acute fracture, subluxation, or prevertebral soft tissue swelling.  Mild to moderate degenerative disc disease is seen from levels  of C3-C7. Moderate to severe facet DJD is seen bilaterally at multiple cervical levels. Advanced atlantoaxial degenerative changes also seen.  IMPRESSION: No acute intracranial abnormality.  No evidence of acute cervical spine fracture or subluxation. Degenerative spondylosis, as described above.  Left orbital and maxillary sinus fractures noted. Consider maxillofacial CT for further evaluation if clinically warranted.   Electronically Signed   By: Earle Gell M.D.   On: 07/26/2015 13:43    Review of Systems  All other systems reviewed and are negative.  Blood pressure 183/70, pulse 77, temperature 97.9 F (36.6 C), resp. rate 13, height 5' 2"  (1.575 m), weight 74.844 kg (165 lb), SpO2 100 %. Physical Exam  Constitutional: She is oriented to person, place, and time. She appears well-developed and well-nourished.  HENT:  Head: Normocephalic.  Cardiovascular: Normal rate.   Respiratory: Effort normal.  Musculoskeletal:       Left wrist: She exhibits tenderness, bony tenderness, swelling and deformity.  Displaced left distal radius and ulna fractures  Neurological: She is alert and oriented to person, place, and time.  Skin: Skin is warm.    Assessment/Plan: As above   Patient was given a 1percent lidocaine hematoma block at bedside  A gentle closed reduction of left radius performed with application of sugartong splint  Will see in my office this Monday afternoon   Pain medication as per ER staff  Charlotte Crumb A 07/26/2015, 3:31 PM

## 2015-07-26 NOTE — ED Notes (Signed)
Family at bedside. Daughter (Amy) reporting she is the primary contact for patient.

## 2015-07-26 NOTE — ED Notes (Signed)
Pt verbalized readiness to go home and be discharged. Pt stated "I feel much better now". V/S reacesssed. Pt assisted in wheelchair and EMT helped pt out of the ED accompanied by pts family.

## 2015-07-26 NOTE — ED Notes (Signed)
When attempting to get pt up and to the wheelchair for discharge, pt felt very faint, clammy, and nauseated. RN assessed pt for any V/S changes, and obtained a CBG reading. Pt values consistent with this ED visit. Pt is A/O. Pt agreed to rest in bed until she started feeling better. Pt family at bedside.

## 2015-07-26 NOTE — Discharge Instructions (Signed)
Facial Fracture A facial fracture is a break in one of the bones of your face. HOME CARE INSTRUCTIONS   Protect the injured part of your face until it is healed.  Do not participate in activities which give chance for re-injury until your doctor approves.  Gently wash and dry your face.  Wear head and facial protection while riding a bicycle, motorcycle, or snowmobile. SEEK MEDICAL CARE IF:   An oral temperature above 102 F (38.9 C) develops.  You have severe headaches or notice changes in your vision.  You have new numbness or tingling in your face.  You develop nausea (feeling sick to your stomach), vomiting or a stiff neck. SEEK IMMEDIATE MEDICAL CARE IF:   You develop difficulty seeing or experience double vision.  You become dizzy, lightheaded, or faint.  You develop trouble speaking, breathing, or swallowing.  You have a watery discharge from your nose or ear. MAKE SURE YOU:   Understand these instructions.  Will watch your condition.  Will get help right away if you are not doing well or get worse. Document Released: 11/23/2005 Document Revised: 02/15/2012 Document Reviewed: 07/12/2008 [facility] Patient Information 2015 [place], [place]. This information is not intended to replace advice given to you by your health care provider. Make sure you discuss any questions you have with your health care provider.  Facial Laceration  A facial laceration is a cut on the face. These injuries can be painful and cause bleeding. Lacerations usually heal quickly, but they need special care to reduce scarring. DIAGNOSIS  Your health care provider will take a medical history, ask for details about how the injury occurred, and examine the wound to determine how deep the cut is. TREATMENT  Some facial lacerations may not require closure. Others may not be able to be closed because of an increased risk of infection. The risk of infection and the chance for successful closure will  depend on various factors, including the amount of time since the injury occurred. The wound may be cleaned to help prevent infection. If closure is appropriate, pain medicines may be given if needed. Your health care provider will use stitches (sutures), wound glue (adhesive), or skin adhesive strips to repair the laceration. These tools bring the skin edges together to allow for faster healing and a better cosmetic outcome. If needed, you may also be given a tetanus shot. HOME CARE INSTRUCTIONS  Only take over-the-counter or prescription medicines as directed by your health care provider.  Follow your health care provider's instructions for wound care. These instructions will vary depending on the technique used for closing the wound. For Sutures:  Keep the wound clean and dry.   If you were given a bandage (dressing), you should change it at least once a day. Also change the dressing if it becomes wet or dirty, or as directed by your health care provider.   Wash the wound with soap and water 2 times a day. Rinse the wound off with water to remove all soap. Pat the wound dry with a clean towel.   After cleaning, apply a thin layer of the antibiotic ointment recommended by your health care provider. This will help prevent infection and keep the dressing from sticking.   You may shower as usual after the first 24 hours. Do not soak the wound in water until the sutures are removed.   Get your sutures removed as directed by your health care provider. With facial lacerations, sutures should usually be taken out after  4-5 days to avoid stitch marks.   Wait a few days after your sutures are removed before applying any makeup. For Skin Adhesive Strips:  Keep the wound clean and dry.   Do not get the skin adhesive strips wet. You may bathe carefully, using caution to keep the wound dry.   If the wound gets wet, pat it dry with a clean towel.   Skin adhesive strips will fall off on  their own. You may trim the strips as the wound heals. Do not remove skin adhesive strips that are still stuck to the wound. They will fall off in time.  For Wound Adhesive:  You may briefly wet your wound in the shower or bath. Do not soak or scrub the wound. Do not swim. Avoid periods of heavy sweating until the skin adhesive has fallen off on its own. After showering or bathing, gently pat the wound dry with a clean towel.   Do not apply liquid medicine, cream medicine, ointment medicine, or makeup to your wound while the skin adhesive is in place. This may loosen the film before your wound is healed.   If a dressing is placed over the wound, be careful not to apply tape directly over the skin adhesive. This may cause the adhesive to be pulled off before the wound is healed.   Avoid prolonged exposure to sunlight or tanning lamps while the skin adhesive is in place.  The skin adhesive will usually remain in place for 5-10 days, then naturally fall off the skin. Do not pick at the adhesive film.  After Healing: Once the wound has healed, cover the wound with sunscreen during the day for 1 full year. This can help minimize scarring. Exposure to ultraviolet light in the first year will darken the scar. It can take 1-2 years for the scar to lose its redness and to heal completely.  SEEK IMMEDIATE MEDICAL CARE IF:  You have redness, pain, or swelling around the wound.   You see ayellowish-white fluid (pus) coming from the wound.   You have chills or a fever.  MAKE SURE YOU:  Understand these instructions.  Will watch your condition.  Will get help right away if you are not doing well or get worse. Document Released: 12/31/2004 Document Revised: 09/13/2013 Document Reviewed: 07/06/2013 [facility] Patient Information 2015 [place], [place]. This information is not intended to replace advice given to you by your health care provider. Make sure you discuss any questions you have with your  health care provider.  Forearm Fracture Your caregiver has diagnosed you as having a broken bone (fracture) of the forearm. This is the part of your arm between the elbow and your wrist. Your forearm is made up of two bones. These are the radius and ulna. A fracture is a break in one or both bones. A cast or splint is used to protect and keep your injured bone from moving. The cast or splint will be on generally for about 5 to 6 weeks, with individual variations. HOME CARE INSTRUCTIONS   Keep the injured part elevated while sitting or lying down. Keeping the injury above the level of your heart (the center of the chest). This will decrease swelling and pain.  Apply ice to the injury for 15-20 minutes, 03-04 times per day while awake, for 2 days. Put the ice in a plastic bag and place a thin towel between the bag of ice and your cast or splint.  If you have a plaster or  fiberglass cast:  Do not try to scratch the skin under the cast using sharp or pointed objects.  Check the skin around the cast every day. You may put lotion on any red or sore areas.  Keep your cast dry and clean.  If you have a plaster splint:  Wear the splint as directed.  You may loosen the elastic around the splint if your fingers become numb, tingle, or turn cold or blue.  Do not put pressure on any part of your cast or splint. It may break. Rest your cast only on a pillow the first 24 hours until it is fully hardened.  Your cast or splint can be protected during bathing with a plastic bag. Do not lower the cast or splint into water.  Only take over-the-counter or prescription medicines for pain, discomfort, or fever as directed by your caregiver. SEEK IMMEDIATE MEDICAL CARE IF:   Your cast gets damaged or breaks.  You have more severe pain or swelling than you did before the cast.  Your skin or nails below the injury turn blue or gray, or feel cold or numb.  There is a bad smell or new stains and/or pus like  (purulent) drainage coming from under the cast. MAKE SURE YOU:   Understand these instructions.  Will watch your condition.  Will get help right away if you are not doing well or get worse. Document Released: 11/20/2000 Document Revised: 02/15/2012 Document Reviewed: 07/12/2008 [facility] Patient Information 2015 [place], [place]. This information is not intended to replace advice given to you by your health care provider. Make sure you discuss any questions you have with your health care provider.

## 2015-07-26 NOTE — ED Provider Notes (Signed)
At the time of discharge the patient became lightheaded. She was reassessed and did not have hypotension. She did have a small amount of nasal bleeding as well. By examination there was no active bleeding and no significant clot with in the nasal passage. There was a small amount of red bleeding in association with the facial trauma and peri orbital hematoma. Examination the posterior oropharynx did not show any blood tracking down the back of the oropharynx. The patient felt that she was lightheaded due to having been supine for so long and not having eaten. She was given graham crackers and juice. The patient was insistent upon going home. I did advise for hospital observation for concussion with dizziness and weakness due to concerns for her function at home. The patient however adamantly refused this and insisted that her family members would assist her overnight. Family patient were counseled on signs and symptoms for which to return.  Charlesetta Shanks, MD 07/26/15 [phone]

## 2015-07-26 NOTE — ED Notes (Signed)
[facility]- pt coming from home after she slipped and fell in her basement. Pt denies dizziness or LOC. Deformity noted to left wrist. Splinted by EMS. Hematoma above left eye with a 2-3cm laceration. 4mg  of zofran given en route.

## 2015-07-29 ENCOUNTER — Telehealth: Payer: Self-pay | Admitting: Cardiology

## 2015-07-29 DIAGNOSIS — S52531A Colles' fracture of right radius, initial encounter for closed fracture: Secondary | ICD-10-CM | POA: Diagnosis not present

## 2015-07-29 NOTE — Telephone Encounter (Signed)
Pt fell on Friday,broke her arm,had a concussion and black eye.She was in [facility] ER all day Friday. She wants to know what to do about her Xarelto? If she is not there,please leave a message.

## 2015-07-29 NOTE — Telephone Encounter (Signed)
Acknowledged; agree

## 2015-07-29 NOTE — Telephone Encounter (Signed)
Returned a call to patient and let her know to hold medication until evlauated by Dr. Laurance Flatten tomorrow since she did have some bleeding with the trauma

## 2015-07-29 NOTE — Telephone Encounter (Signed)
I reviewed the ED records.  I think she should hold it until she is seen by Dr. Laurance Flatten and cleared to resume since there was some bleeding with this trauma.

## 2015-07-29 NOTE — Telephone Encounter (Signed)
[facility] and see in ED Friday  Had x-rays including CR head and neck.  See imaging results.  Did not take Xarelto Friday or Saturday,  Took on Sunday evening.  Question:  She would like to know if she should continue Xarelto or should she stop it short term and for how long?

## 2015-07-30 ENCOUNTER — Ambulatory Visit (INDEPENDENT_AMBULATORY_CARE_PROVIDER_SITE_OTHER): Payer: Medicare Other | Admitting: Family Medicine

## 2015-07-30 ENCOUNTER — Encounter: Payer: Self-pay | Admitting: Family Medicine

## 2015-07-30 ENCOUNTER — Telehealth: Payer: Self-pay | Admitting: Cardiology

## 2015-07-30 VITALS — BP 142/74 | HR 94 | Temp 96.9°F | Ht 62.0 in | Wt 165.0 lb

## 2015-07-30 DIAGNOSIS — S02401A Maxillary fracture, unspecified, initial encounter for closed fracture: Secondary | ICD-10-CM

## 2015-07-30 DIAGNOSIS — I480 Paroxysmal atrial fibrillation: Secondary | ICD-10-CM

## 2015-07-30 DIAGNOSIS — R030 Elevated blood-pressure reading, without diagnosis of hypertension: Secondary | ICD-10-CM | POA: Diagnosis not present

## 2015-07-30 DIAGNOSIS — R7989 Other specified abnormal findings of blood chemistry: Secondary | ICD-10-CM | POA: Diagnosis not present

## 2015-07-30 DIAGNOSIS — S0240DA Maxillary fracture, left side, initial encounter for closed fracture: Secondary | ICD-10-CM

## 2015-07-30 DIAGNOSIS — S0219XA Other fracture of base of skull, initial encounter for closed fracture: Secondary | ICD-10-CM | POA: Diagnosis not present

## 2015-07-30 DIAGNOSIS — IMO0001 Reserved for inherently not codable concepts without codable children: Secondary | ICD-10-CM

## 2015-07-30 DIAGNOSIS — S52332A Displaced oblique fracture of shaft of left radius, initial encounter for closed fracture: Secondary | ICD-10-CM | POA: Diagnosis not present

## 2015-07-30 DIAGNOSIS — S5292XA Unspecified fracture of left forearm, initial encounter for closed fracture: Secondary | ICD-10-CM

## 2015-07-30 DIAGNOSIS — E559 Vitamin D deficiency, unspecified: Secondary | ICD-10-CM

## 2015-07-30 DIAGNOSIS — Z7689 Persons encountering health services in other specified circumstances: Secondary | ICD-10-CM | POA: Diagnosis not present

## 2015-07-30 NOTE — Progress Notes (Signed)
Subjective:    Patient ID: Kristy [name], female    DOB: [birth year], 81 y.o.   MRN: [id number]  HPI Pt here for follow up and management of chronic medical problems which includes atrial fibrillation and hypertension. She is taking medications regularly. The patient comes to the visit today with her niece. 4 days ago she was in her basement and slipped on some water and fell and fractured her left radius. She also sustained a contusion to her face. She also sustained left orbital and maxillary sinus fractures. 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       Patient Active Problem List   Diagnosis Date Noted  . PAF (paroxysmal atrial fibrillation) 12/18/2013  . Cervical spondylosis without myelopathy 04/27/2013  . ABDOMINAL PAIN, UNSPECIFIED SITE 02/02/2011  . Palpitations 02/26/2010  . COLONIC POLYPS, HYPERPLASTIC 12/07/2007  . LIPOMA 12/07/2007  . OBESITY 12/07/2007  . HYPERTENSION 12/07/2007  . GERD 12/07/2007  . HIATAL HERNIA 12/07/2007  . DIVERTICULOSIS, COLON 12/07/2007  . FATTY LIVER DISEASE 12/07/2007  . ENDOMETRIAL POLYP 12/07/2007  . COMPLEX ENDOMETRIAL HYPERPLASIA WITHOUT ATYPIA 12/07/2007  . SOB 12/07/2007   Outpatient Encounter Prescriptions as of 07/30/2015  Medication Sig  . ALPRAZolam (XANAX) 0.25 MG tablet Take 1 tablet (0.25 mg total) by mouth daily as needed for anxiety.  . Cholecalciferol (VITAMIN D3) 2000 UNITS TABS Take by mouth daily.  Marland Kitchen estradiol (CLIMARA - DOSED IN MG/24 HR) 0.025 mg/24hr patch Place 0.025 mg onto the skin once a week.  . hydrochlorothiazide (HYDRODIURIL) 12.5 MG tablet Take 1 tablet (12.5 mg total) by mouth daily.  Marland Kitchen HYDROcodone-acetaminophen (NORCO/VICODIN) 5-325 MG per tablet Take 1 tablet by mouth every 4 (four) hours as needed.  .  metoprolol succinate (TOPROL-XL) 25 MG 24 hr tablet TAKE ONE TABLET BY MOUTH ONE TIME DAILY  . Multiple Vitamin (MULTIVITAMIN) tablet Take 1 tablet by mouth daily.    Marland Kitchen omeprazole (PRILOSEC) 20 MG capsule TAKE ONE CAPSULE BY MOUTH TWICE DAILY  . ondansetron (ZOFRAN) 4 MG tablet Take 1 tablet (4 mg total) by mouth every 6 (six) hours.  . ONE TOUCH ULTRA TEST test strip USE TO TEST SUGAR TWICE A DAY AS INSTRUCTED  . Polyethyl Glycol-Propyl Glycol (SYSTANE OP) Apply 1-2 drops to eye 4 (four) times daily as needed (dry eyes).  . progesterone (PROMETRIUM) 200 MG capsule 12 tabs every four months  . sertraline (ZOLOFT) 50 MG tablet TAKE ONE TABLET BY MOUTH ONE TIME DAILY  . XARELTO 15 MG TABS tablet TAKE 1 TABLET (15 MG TOTAL) BY MOUTH DAILY.  . [DISCONTINUED] cephALEXin (KEFLEX) 500 MG capsule Take 1 capsule (500 mg total) by mouth 2 (two) times daily.   No facility-administered encounter medications on file as of 07/30/2015.     Review of Systems  Constitutional: Negative.   HENT: Negative.   Eyes: Negative.   Respiratory: Negative.   Cardiovascular: Negative.   Gastrointestinal: Negative.   Endocrine: Negative.   Genitourinary: Negative.   Musculoskeletal: Positive for myalgias (recent fall - FRIDAY) and arthralgias.  Skin: Negative.   Allergic/Immunologic: Negative.   Neurological: Negative.   Hematological: Negative.   Psychiatric/Behavioral: Negative.        Objective:   Physical Exam  Constitutional: She is oriented to person, place, and time. She appears well-developed and  well-nourished. She appears distressed.  Elderly facial contusions secondary to fracture and left arm in a sling secondary to fracture but alert and upset and anxious  HENT:  Right Ear: External ear normal.  Left Ear: External ear normal.  Mouth/Throat: No oropharyngeal exudate.  There is nasal congestion without bloody drainage. The throat has some fresh blood draining down posteriorly. The patient has  multiple bruises on the left face secondary to frontal and maxillary sinus fractures.  Eyes: Conjunctivae and EOM are normal. Pupils are equal, round, and reactive to light. Right eye exhibits no discharge. Left eye exhibits no discharge. No scleral icterus.  There is swelling around the left orbit.  Neck: Normal range of motion. Neck supple. No JVD present. No thyromegaly present.  Cardiovascular: Normal rate, regular rhythm and normal heart sounds.   No murmur heard. The heart is regular today at 96/m  Pulmonary/Chest: Effort normal and breath sounds normal. No respiratory distress. She has no wheezes. She has no rales. She exhibits no tenderness.  Clear anteriorly and posteriorly  Abdominal: Soft. Bowel sounds are normal. She exhibits no mass. There is no tenderness. There is no rebound and no guarding.  Abdomen was soft without masses  Musculoskeletal: She exhibits no edema or tenderness.  The left arm is in a cast with further surgery due in 6 days for metal plates to be inserted  Lymphadenopathy:    She has no cervical adenopathy.  Neurological: She is alert and oriented to person, place, and time.  Skin: Skin is warm and dry. No rash noted.  Psychiatric: She has a normal mood and affect. Her behavior is normal. Judgment and thought content normal.  The mood is anxious but in some respects upbeat to get better. She has hired someone to come in the home and help her until she can get her strength back.  Nursing note and vitals reviewed.         Assessment & Plan:  1. Paroxysmal atrial fibrillation -The rhythm is regular today at about 96/m -A conversation with the cardiologist today and he felt like she should stop the Xarelto because of the bleeding down the back of her throat. -The patient was informed of this. - BMP8+EGFR - CBC with Differential/Platelet - Hepatic function panel - Lipid panel  2. Vitamin D deficiency -Continue current treatment pending results of lab  work - CBC with Differential/Platelet - Vit D  25 hydroxy (rtn osteoporosis monitoring)  3. Elevated BP -The blood pressure slightly elevated today but no change in treatment - BMP8+EGFR - CBC with Differential/Platelet - Hepatic function panel - Lipid panel  4. Frontal sinus fracture, closed, initial encounter -A follow-up appointment is being arranged with ENT as soon as possible  5. Left maxillary fracture, closed, initial encounter -A follow-up appointment is being arranged with your nose and throat as soon as possible  6. Left radial fracture, closed, initial encounter -The patient has an appointment with the orthopedic surgeon for further surgery on her left radius  Patient Instructions                       Medicare Annual Wellness Visit  [facility] and the medical providers at [facility] strive to bring you the best medical care.  In doing so we not only want to address your current medical conditions and concerns but also to detect new conditions early and prevent illness, disease and health-related problems.    Medicare offers a yearly  Wellness Visit which allows our clinical staff to assess your need for preventative services including immunizations, lifestyle education, counseling to decrease risk of preventable diseases and screening for fall risk and other medical concerns.    This visit is provided free of charge (no copay) for all Medicare recipients. The clinical pharmacists at [facility] have begun to conduct these Wellness Visits which will also include a thorough review of all your medications.    As you primary medical provider recommend that you make an appointment for your Annual Wellness Visit if you have not done so already this year.  You may set up this appointment before you leave today or you may call back ([phone]) and schedule an appointment.  Please make sure when you call that you mention that you are  scheduling your Annual Wellness Visit with the clinical pharmacist so that the appointment may be made for the proper length of time.     Continue current medications. Continue good therapeutic lifestyle changes which include good diet and exercise. Fall precautions discussed with patient. If an FOBT was given today- please return it to our front desk. If you are over 16 years old - you may need Prevnar 43 or the adult Pneumonia vaccine.  **Flu shots will be available soon--- please call and schedule a FLU-CLINIC appointment**  After your visit with [place] today you will receive a survey in the mail or online from [employer] regarding your care with [place]. Please take a moment to fill this out. Your feedback is very important to [place] as you can help [place] better understand your patient needs as well as improve your experience and satisfaction. WE CARE ABOUT YOU!!!   **Please join [place] SEPT.22, 2016 from 5:00 to 7:00pm for our OPEN HOUSE! Come out and meet our NEW providers** The patient should discontinue her Xarelto The patient should follow-up with orthopedic surgeon as planned on Monday for additional surgery The patient should follow-up with the ear nose and throat specialist as soon as possible because of the frontal and maxillary fractures on the left   Arrie Senate MD

## 2015-07-30 NOTE — Patient Instructions (Addendum)
Medicare Annual Wellness Visit  [facility] and the medical providers at [facility] strive to bring you the best medical care.  In doing so we not only want to address your current medical conditions and concerns but also to detect new conditions early and prevent illness, disease and health-related problems.    Medicare offers a yearly Wellness Visit which allows our clinical staff to assess your need for preventative services including immunizations, lifestyle education, counseling to decrease risk of preventable diseases and screening for fall risk and other medical concerns.    This visit is provided free of charge (no copay) for all Medicare recipients. The clinical pharmacists at [facility] have begun to conduct these Wellness Visits which will also include a thorough review of all your medications.    As you primary medical provider recommend that you make an appointment for your Annual Wellness Visit if you have not done so already this year.  You may set up this appointment before you leave today or you may call back ([phone]) and schedule an appointment.  Please make sure when you call that you mention that you are scheduling your Annual Wellness Visit with the clinical pharmacist so that the appointment may be made for the proper length of time.     Continue current medications. Continue good therapeutic lifestyle changes which include good diet and exercise. Fall precautions discussed with patient. If an FOBT was given today- please return it to our front desk. If you are over 66 years old - you may need Prevnar 14 or the adult Pneumonia vaccine.  **Flu shots will be available soon--- please call and schedule a FLU-CLINIC appointment**  After your visit with [place] today you will receive a survey in the mail or online from [employer] regarding your care with [place]. Please take a moment to fill this out. Your feedback is  very important to [place] as you can help [place] better understand your patient needs as well as improve your experience and satisfaction. WE CARE ABOUT YOU!!!   **Please join [place] SEPT.[email or web address], 2016 from 5:00 to 7:00pm for our OPEN HOUSE! Come out and meet our NEW providers** The patient should discontinue her Xarelto The patient should follow-up with orthopedic surgeon as planned on Monday for additional surgery The patient should follow-up with the ear nose and throat specialist as soon as possible because of the frontal and maxillary fractures on the left

## 2015-07-31 LAB — HEPATIC FUNCTION PANEL
ALBUMIN: 4.5 g/dL (ref 3.5–4.7)
ALK PHOS: 66 IU/L (ref 39–117)
ALT: 12 IU/L (ref 0–32)
AST: 20 IU/L (ref 0–40)
BILIRUBIN, DIRECT: 0.16 mg/dL (ref 0.00–0.40)
Bilirubin Total: 0.5 mg/dL (ref 0.0–1.2)
TOTAL PROTEIN: 6.6 g/dL (ref 6.0–8.5)

## 2015-07-31 LAB — CBC WITH DIFFERENTIAL/PLATELET
BASOS ABS: 0 10*3/uL (ref 0.0–0.2)
Basos: 0 %
EOS (ABSOLUTE): 0 10*3/uL (ref 0.0–0.4)
Eos: 1 %
Hematocrit: 40.7 % (ref 34.0–46.6)
Hemoglobin: 13.6 g/dL (ref 11.1–15.9)
IMMATURE GRANULOCYTES: 0 %
Immature Grans (Abs): 0 10*3/uL (ref 0.0–0.1)
Lymphocytes Absolute: 1.2 10*3/uL (ref 0.7–3.1)
Lymphs: 14 %
MCH: 29.8 pg (ref 26.6–33.0)
MCHC: 33.4 g/dL (ref 31.5–35.7)
MCV: 89 fL (ref 79–97)
MONOS ABS: 0.6 10*3/uL (ref 0.1–0.9)
Monocytes: 7 %
NEUTROS PCT: 78 %
Neutrophils Absolute: 6.6 10*3/uL (ref 1.4–7.0)
PLATELETS: 508 10*3/uL — AB (ref 150–379)
RBC: 4.57 x10E6/uL (ref 3.77–5.28)
RDW: 14.1 % (ref 12.3–15.4)
WBC: 8.4 10*3/uL (ref 3.4–10.8)

## 2015-07-31 LAB — BMP8+EGFR
BUN / CREAT RATIO: 15 (ref 11–26)
BUN: 14 mg/dL (ref 8–27)
CO2: 22 mmol/L (ref 18–29)
CREATININE: 0.93 mg/dL (ref 0.57–1.00)
Calcium: 9.7 mg/dL (ref 8.7–10.3)
Chloride: 101 mmol/L (ref 97–108)
GFR calc Af Amer: 65 mL/min/{1.73_m2} (ref >59)
GFR, EST NON AFRICAN AMERICAN: 57 mL/min/{1.73_m2} — AB (ref >59)
GLUCOSE: 190 mg/dL — AB (ref 65–99)
Potassium: 4 mmol/L (ref 3.5–5.2)
Sodium: 141 mmol/L (ref 134–144)

## 2015-07-31 LAB — LIPID PANEL
CHOL/HDL RATIO: 3.3 ratio (ref 0.0–4.4)
Cholesterol, Total: 172 mg/dL (ref 100–199)
HDL: 52 mg/dL (ref >39)
LDL CALC: 94 mg/dL (ref 0–99)
Triglycerides: 129 mg/dL (ref 0–149)
VLDL Cholesterol Cal: 26 mg/dL (ref 5–40)

## 2015-07-31 LAB — VITAMIN D 25 HYDROXY (VIT D DEFICIENCY, FRACTURES): Vit D, 25-Hydroxy: 52 ng/mL (ref 30.0–100.0)

## 2015-08-01 DIAGNOSIS — S01112D Laceration without foreign body of left eyelid and periocular area, subsequent encounter: Secondary | ICD-10-CM | POA: Diagnosis not present

## 2015-08-01 DIAGNOSIS — S02401D Maxillary fracture, unspecified, subsequent encounter for fracture with routine healing: Secondary | ICD-10-CM | POA: Diagnosis not present

## 2015-08-01 DIAGNOSIS — W19XXXD Unspecified fall, subsequent encounter: Secondary | ICD-10-CM | POA: Diagnosis not present

## 2015-08-01 LAB — HGB A1C W/O EAG: Hgb A1c MFr Bld: 5.9 % — ABNORMAL HIGH (ref 4.8–5.6)

## 2015-08-01 LAB — SPECIMEN STATUS REPORT

## 2015-08-01 NOTE — Progress Notes (Signed)
Patient aware.

## 2015-08-02 ENCOUNTER — Other Ambulatory Visit (HOSPITAL_COMMUNITY): Payer: Self-pay | Admitting: Otolaryngology

## 2015-08-02 DIAGNOSIS — S0240CD Maxillary fracture, right side, subsequent encounter for fracture with routine healing: Secondary | ICD-10-CM

## 2015-08-05 ENCOUNTER — Other Ambulatory Visit (HOSPITAL_COMMUNITY): Payer: Medicare Other

## 2015-08-05 DIAGNOSIS — S52572A Other intraarticular fracture of lower end of left radius, initial encounter for closed fracture: Secondary | ICD-10-CM | POA: Diagnosis not present

## 2015-08-05 DIAGNOSIS — G8918 Other acute postprocedural pain: Secondary | ICD-10-CM | POA: Diagnosis not present

## 2015-08-05 DIAGNOSIS — S52332A Displaced oblique fracture of shaft of left radius, initial encounter for closed fracture: Secondary | ICD-10-CM | POA: Diagnosis not present

## 2015-08-08 DIAGNOSIS — S52332D Displaced oblique fracture of shaft of left radius, subsequent encounter for closed fracture with routine healing: Secondary | ICD-10-CM | POA: Diagnosis not present

## 2015-08-08 DIAGNOSIS — S52332A Displaced oblique fracture of shaft of left radius, initial encounter for closed fracture: Secondary | ICD-10-CM | POA: Diagnosis not present

## 2015-08-09 ENCOUNTER — Ambulatory Visit (HOSPITAL_COMMUNITY)
Admission: RE | Admit: 2015-08-09 | Discharge: 2015-08-09 | Disposition: A | Discharge status: Home / Self Care | Payer: Medicare Other | Source: Ambulatory Visit | Attending: Otolaryngology | Admitting: Otolaryngology

## 2015-08-09 DIAGNOSIS — X58XXXD Exposure to other specified factors, subsequent encounter: Secondary | ICD-10-CM | POA: Insufficient documentation

## 2015-08-09 DIAGNOSIS — S02401D Maxillary fracture, unspecified, subsequent encounter for fracture with routine healing: Secondary | ICD-10-CM | POA: Diagnosis not present

## 2015-08-09 DIAGNOSIS — S0240CD Maxillary fracture, right side, subsequent encounter for fracture with routine healing: Secondary | ICD-10-CM

## 2015-09-09 DIAGNOSIS — S52332D Displaced oblique fracture of shaft of left radius, subsequent encounter for closed fracture with routine healing: Secondary | ICD-10-CM | POA: Diagnosis not present

## 2015-09-16 ENCOUNTER — Ambulatory Visit (INDEPENDENT_AMBULATORY_CARE_PROVIDER_SITE_OTHER): Payer: Medicare Other | Admitting: Pharmacist

## 2015-09-16 DIAGNOSIS — Z23 Encounter for immunization: Secondary | ICD-10-CM

## 2015-09-16 DIAGNOSIS — R159 Full incontinence of feces: Secondary | ICD-10-CM

## 2015-09-16 DIAGNOSIS — Z79899 Other long term (current) drug therapy: Secondary | ICD-10-CM | POA: Diagnosis not present

## 2015-09-16 DIAGNOSIS — G4700 Insomnia, unspecified: Secondary | ICD-10-CM | POA: Diagnosis not present

## 2015-09-16 MED ORDER — CALCIUM CITRATE-VITAMIN D 250-100 MG-UNIT PO TABS: 1.0000 | ORAL_TABLET | Freq: Every day | ORAL | Status: DC

## 2015-09-16 MED ORDER — RANITIDINE HCL 75 MG PO TABS: 75.0000 mg | ORAL_TABLET | Freq: Two times a day (BID) | ORAL | Status: DC

## 2015-09-16 MED ORDER — DICLOFENAC SODIUM 1 % TD GEL: 4.0000 g | Freq: Four times a day (QID) | TRANSDERMAL | Status: DC | PRN

## 2015-09-16 NOTE — Progress Notes (Signed)
Patient ID: Kristy [name], female   DOB: June 09, 1928, 81 y.o.   MRN: [id number]  CC:  Medication Management / Loose stools  HPI:   Kristy [name] states that for the last several months she is experienced 2 to 3 loose stools daily.  She also reports occasionally that she has had stool incontinence.  She is concerned that it is her omeprazole.  Kristy [name] mentions that she fell in her basement in August and broke her wrist.  She has been resistant to taking bone building medications in the past.  She is taking vitamin D currently but not calcium.  Below are her current medications.    Outpatient Encounter Prescriptions as of 09/16/2015  Medication Sig  . ALPRAZolam (XANAX) 0.25 MG tablet Take 1 tablet (0.25 mg total) by mouth daily as needed for anxiety.  . Cholecalciferol (VITAMIN D3) 2000 UNITS TABS Take by mouth daily.  Marland Kitchen estradiol (CLIMARA - DOSED IN MG/24 HR) 0.025 mg/24hr patch Place 0.025 mg onto the skin once a week.  . hydrochlorothiazide (HYDRODIURIL) 12.5 MG tablet Take 1 tablet (12.5 mg total) by mouth daily.  . metoprolol succinate (TOPROL-XL) 25 MG 24 hr tablet TAKE ONE TABLET BY MOUTH ONE TIME DAILY  . Multiple Vitamin (MULTIVITAMIN) tablet Take 1 tablet by mouth daily.    Marland Kitchen omeprazole (PRILOSEC) 20 MG capsule TAKE ONE CAPSULE BY MOUTH TWICE DAILY  . ONE TOUCH ULTRA TEST test strip USE TO TEST SUGAR TWICE A DAY AS INSTRUCTED  . Polyethyl Glycol-Propyl Glycol (SYSTANE OP) Apply 1-2 drops to eye 4 (four) times daily as needed (dry eyes).  . progesterone (PROMETRIUM) 200 MG capsule 12 tabs every four months  . sertraline (ZOLOFT) 50 MG tablet TAKE ONE TABLET BY MOUTH ONE TIME DAILY  . XARELTO 15 MG TABS tablet TAKE 1 TABLET (15 MG TOTAL) BY MOUTH DAILY.  . Magnesium 250 MG TABS Take 250 mg by mouth daily.  . [DISCONTINUED] HYDROcodone-acetaminophen (NORCO/VICODIN) 5-325 MG per tablet Take 1 tablet by mouth every 4 (four) hours as needed. (Patient not taking: Reported on 09/16/2015)  .  [DISCONTINUED] ondansetron (ZOFRAN) 4 MG tablet Take 1 tablet (4 mg total) by mouth every 6 (six) hours. (Patient not taking: Reported on 09/16/2015)   No facility-administered encounter medications on file as of 09/16/2015.   Patient also asks about taking melatonin for sleep and mentions that cost of Volteran Gel is high but it helped greatly in past with pain.  Assessment:  Loose stools possibly related to medications - magnesium supplement or omeprazole Hiatal Hernia Insomnia Pain  Plan:  D/C magnesium for 1 week to see if loose stools improve.  If now then change omeprazole to ranitidine 75mg  bid. If still no improvement then will refer to GI.  Start calcium citrate 250mg  1-2 tablet daily (may help with loose stools and bone health) Try melatonin 3 to 5 mg daily prior to sleep . Rx for generic diclofenac gel sent to [facility], PharmD, CPP, CDE

## 2015-09-16 NOTE — Patient Instructions (Signed)
Don't take magnesium for 1 week and see if loose stools improve.  If not then try changing omeprazole to zantac / ranitidine 73m - take 1 tablet twice a day prior to breakfast and supper.   Try melatonin - take 3 to 5mg  1/2 to 1 hour prior to sleep.

## 2015-09-18 ENCOUNTER — Telehealth: Payer: Self-pay

## 2015-09-18 NOTE — Telephone Encounter (Signed)
Insurance prior authorized Diclofenac 1% gel through 10/11 17

## 2015-10-03 DIAGNOSIS — S52332D Displaced oblique fracture of shaft of left radius, subsequent encounter for closed fracture with routine healing: Secondary | ICD-10-CM | POA: Diagnosis not present

## 2015-11-05 ENCOUNTER — Encounter: Payer: Self-pay | Admitting: Gastroenterology

## 2015-11-11 ENCOUNTER — Other Ambulatory Visit (INDEPENDENT_AMBULATORY_CARE_PROVIDER_SITE_OTHER): Payer: Medicare Other

## 2015-11-11 ENCOUNTER — Other Ambulatory Visit: Payer: Self-pay | Admitting: *Deleted

## 2015-11-11 DIAGNOSIS — I480 Paroxysmal atrial fibrillation: Secondary | ICD-10-CM

## 2015-11-12 ENCOUNTER — Other Ambulatory Visit: Payer: Self-pay | Admitting: *Deleted

## 2015-11-12 DIAGNOSIS — R7989 Other specified abnormal findings of blood chemistry: Secondary | ICD-10-CM

## 2015-11-12 LAB — CBC WITH DIFFERENTIAL/PLATELET
BASOS ABS: 0 10*3/uL (ref 0.0–0.2)
BASOS: 1 %
EOS (ABSOLUTE): 0.1 10*3/uL (ref 0.0–0.4)
EOS: 1 %
HEMATOCRIT: 41.9 % (ref 34.0–46.6)
Hemoglobin: 14 g/dL (ref 11.1–15.9)
Immature Grans (Abs): 0 10*3/uL (ref 0.0–0.1)
Immature Granulocytes: 0 %
LYMPHS ABS: 1.7 10*3/uL (ref 0.7–3.1)
Lymphs: 23 %
MCH: 29.9 pg (ref 26.6–33.0)
MCHC: 33.4 g/dL (ref 31.5–35.7)
MCV: 89 fL (ref 79–97)
MONOS ABS: 0.4 10*3/uL (ref 0.1–0.9)
Monocytes: 6 %
NEUTROS ABS: 5.4 10*3/uL (ref 1.4–7.0)
Neutrophils: 69 %
Platelets: 513 10*3/uL — ABNORMAL HIGH (ref 150–379)
RBC: 4.69 x10E6/uL (ref 3.77–5.28)
RDW: 14.8 % (ref 12.3–15.4)
WBC: 7.7 10*3/uL (ref 3.4–10.8)

## 2015-11-14 ENCOUNTER — Telehealth: Payer: Self-pay | Admitting: Family Medicine

## 2015-11-21 DIAGNOSIS — S52532D Colles' fracture of left radius, subsequent encounter for closed fracture with routine healing: Secondary | ICD-10-CM | POA: Diagnosis not present

## 2015-11-27 ENCOUNTER — Other Ambulatory Visit (HOSPITAL_COMMUNITY): Payer: Self-pay | Admitting: Hematology & Oncology

## 2015-11-27 ENCOUNTER — Encounter (HOSPITAL_COMMUNITY): Payer: Medicare Other | Attending: Hematology & Oncology | Admitting: Hematology & Oncology

## 2015-11-27 ENCOUNTER — Encounter (HOSPITAL_COMMUNITY): Payer: Self-pay | Admitting: Hematology & Oncology

## 2015-11-27 VITALS — BP 151/60 | HR 78 | Temp 97.8°F | Resp 18 | Wt 165.0 lb

## 2015-11-27 DIAGNOSIS — Z1231 Encounter for screening mammogram for malignant neoplasm of breast: Secondary | ICD-10-CM

## 2015-11-27 DIAGNOSIS — D473 Essential (hemorrhagic) thrombocythemia: Secondary | ICD-10-CM | POA: Diagnosis not present

## 2015-11-27 DIAGNOSIS — D75839 Thrombocytosis, unspecified: Secondary | ICD-10-CM

## 2015-11-27 DIAGNOSIS — Z139 Encounter for screening, unspecified: Secondary | ICD-10-CM

## 2015-11-27 LAB — CBC WITH DIFFERENTIAL/PLATELET
Basophils Absolute: 0 10*3/uL (ref 0.0–0.1)
Basophils Relative: 1 %
EOS PCT: 1 %
Eosinophils Absolute: 0.1 10*3/uL (ref 0.0–0.7)
HEMATOCRIT: 38.9 % (ref 36.0–46.0)
Hemoglobin: 12.8 g/dL (ref 12.0–15.0)
LYMPHS PCT: 29 %
Lymphs Abs: 2.1 10*3/uL (ref 0.7–4.0)
MCH: 29.9 pg (ref 26.0–34.0)
MCHC: 32.9 g/dL (ref 30.0–36.0)
MCV: 90.9 fL (ref 78.0–100.0)
MONO ABS: 0.4 10*3/uL (ref 0.1–1.0)
MONOS PCT: 5 %
NEUTROS ABS: 4.6 10*3/uL (ref 1.7–7.7)
Neutrophils Relative %: 64 %
PLATELETS: 414 10*3/uL — AB (ref 150–400)
RBC: 4.28 MIL/uL (ref 3.87–5.11)
RDW: 13.9 % (ref 11.5–15.5)
WBC: 7.1 10*3/uL (ref 4.0–10.5)

## 2015-11-27 LAB — SEDIMENTATION RATE: Sed Rate: 5 mm/hr (ref 0–22)

## 2015-11-27 LAB — TSH: TSH: 1.653 u[IU]/mL (ref 0.350–4.500)

## 2015-11-27 NOTE — Progress Notes (Signed)
[place] [facility] [place] NOTE  Patient Care Team: Chipper Herb, MD as PCP - General (Family Medicine) Minus Breeding, MD as Consulting Physician (Cardiology) Lafayette Dragon, MD as Consulting Physician (Gastroenterology) Clent Jacks, MD as Consulting Physician (Ophthalmology) Raeanne Gathers, AUD (Audiology)  CHIEF COMPLAINTS/PURPOSE OF CONSULTATION:  Thrombocytosis  HISTORY OF PRESENTING ILLNESS:  Kristy [name] 81 y.o. female is here because of elevated platelets.  Kristy [name] comes to the [place] today with her niece, who she says is "more like a sister."  Day to day, she says she's so tired. "I'm just exhausted."  She says her appetite is "too good."  As far as recent events, she fell and broke her arm in August. She had a plate put in it and she says that it "gives her a fit.' She can't take NSAIDs because she takes Xarelto, and she says "I've had a time with that thing."   She remarks that her hand is numb and tingles, but this is all from the broken arm. When she broke her arm, she slipped and fell on some water and hit the concrete floor of her basement. She hit her head as well.  Kristy [name] does not fall regularly.   She has shortness of breath, but attributes this to her huge hiatal hernia, the "biggest her doctor had ever seen."  She denies any nausea or vomiting; she says that she has problems with her bowels but is trying to straighten that out on her own.  She denies any urinary problems, and denies problems with numbness or tingling in her feet.  Her children live relatively far away, but her niece is accessible.  Kristy [name] had her last colonoscopy 4-5 years ago and doesn't remember any abnormal results.  She has not had a mammogram in 4 years, but is due to get one in January.  She denies headaches, blurry vision, bleeding or unusual bruising.    MEDICAL HISTORY:  Past Medical History  Diagnosis Date  . Hypertension   .  Obesity   . Colon polyps   . Endometrial polyp   . SOB (shortness of breath)   . Diverticulosis   . GERD (gastroesophageal reflux disease)   . Hiatal hernia   . Endometrial hyperplasia without atypia, simple   . Hyperplastic colon polyp   . Lipoma   . Fatty liver   . A-fib [facility])     SURGICAL HISTORY: Past Surgical History  Procedure Laterality Date  . Cholecystectomy  03/2002  . Umbilical hernia repair  09/2002  . Knee surgery    . Optic neuritis    . Dilation and curettage of uterus      x 2  . Cataract extraction    . Breast biopsy    . Hernia repair      SOCIAL HISTORY: Social History   Social History  . Marital Status: Married    Spouse Name: N/A  . Number of Children: N/A  . Years of Education: N/A   Occupational History  . [employer]     Retired   Social History Main Topics  . Smoking status: Former Smoker    Quit date: 12/07/1982  . Smokeless tobacco: Never Used  . Alcohol Use: No  . Drug Use: No  . Sexual Activity: Not on file   Other Topics Concern  . Not on file   Social History Narrative   Married 65 years. Her husband is not well,  he has dementia; short-term memory is gone He has just had cancer as well and was treated with radiation by Dr. Isidore Moos He still knows her; he still enjoys eating and they still go out to eat and attend church. They used to ballroom dance and she says "oh, we've had a wonderful life."  She worked up past 60. 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 Laurance Flatten.  She says she puffed smoke but never inhaled; she "puffed" while her friends were playing [employer]. She has never had problems with alcohol.  She has two living children, one dead. Her first born was microcephalic. He lived to be 28. When she heard the news of the [place] virus this year, it brought back all  of her sadness about him. She started taking Xanex to alleviate her stress about this matter. Three grandchildren and no great grandchildren.  FAMILY HISTORY: Family History  Problem Relation Age of Onset  . Diabetes Mother   . Heart disease Mother   . Diabetes Sister   . Diabetes Brother   . Heart disease Brother   . Colon cancer Neg Hx    indicated that her mother is deceased. She indicated that her father is deceased. She indicated that her sister is deceased.    Her mother lived to be 54; she was diabetic. She was very healthy except for diabetes and high blood pressure. She got up one morning, she died suddenly. Father lived to be 37; he was well most of his life until the last 6 months, had a stroke. She is the baby of 13 children. She is one of two that are still living.  Her sister is 95 and still living. 6 in the family have lived into the 73's.  ALLERGIES:  is allergic to codeine and tramadol.  MEDICATIONS:  Current Outpatient Prescriptions  Medication Sig Dispense Refill  . calcium-vitamin D 250-100 MG-UNIT tablet Take 1-2 tablets by mouth daily.    . Cholecalciferol (VITAMIN D3) 2000 UNITS TABS Take by mouth daily.    Marland Kitchen estradiol (CLIMARA - DOSED IN MG/24 HR) 0.025 mg/24hr patch Place 0.025 mg onto the skin once a week.    . hydrochlorothiazide (HYDRODIURIL) 12.5 MG tablet Take 1 tablet (12.5 mg total) by mouth daily. 90 tablet 2  . metoprolol succinate (TOPROL-XL) 25 MG 24 hr tablet TAKE ONE TABLET BY MOUTH ONE TIME DAILY 90 tablet 3  . Multiple Vitamin (MULTIVITAMIN) tablet Take 1 tablet by mouth daily.      Marland Kitchen omeprazole (PRILOSEC) 20 MG capsule TAKE ONE CAPSULE BY MOUTH TWICE DAILY 60 capsule 5  . ONE TOUCH ULTRA TEST test strip USE TO TEST SUGAR TWICE A DAY AS INSTRUCTED 100 each 2  . Polyethyl Glycol-Propyl Glycol (SYSTANE OP) Apply 1-2 drops to eye 4 (four) times daily as needed (dry eyes).    . progesterone (PROMETRIUM) 200 MG capsule 12 tabs every four months  12 capsule 12  . sertraline (ZOLOFT) 50 MG tablet TAKE ONE TABLET BY MOUTH ONE TIME DAILY 90 tablet 1  . XARELTO 15 MG TABS tablet TAKE 1 TABLET (15 MG TOTAL) BY MOUTH DAILY. 30 tablet 5  . ALPRAZolam (XANAX) 0.25 MG tablet Take 1 tablet (0.25 mg total) by mouth daily as needed for anxiety. (Patient not taking: Reported on 11/27/2015) 30 tablet 1  . diclofenac sodium (VOLTAREN)  1 % GEL Apply 4 g topically 4 (four) times daily as needed. (Patient not taking: Reported on 11/27/2015) 200 g 1   No current facility-administered medications for this visit.    Review of Systems  Constitutional: Positive for malaise/fatigue. Negative for fever, chills and weight loss.  HENT: Negative.  Negative for congestion, hearing loss, nosebleeds, sore throat and tinnitus.   Eyes: Negative.  Negative for blurred vision, double vision, pain and discharge.  Respiratory: Negative.  Negative for cough, hemoptysis, sputum production, shortness of breath and wheezing.   Cardiovascular: Negative.  Negative for chest pain, palpitations, claudication, leg swelling and PND.  Gastrointestinal: Positive for diarrhea. Negative for heartburn, nausea, vomiting, abdominal pain, constipation, blood in stool and melena.  Genitourinary: Negative.  Negative for dysuria, urgency, frequency and hematuria.  Musculoskeletal: Negative.  Negative for myalgias, joint pain and falls.  Skin: Negative.  Negative for itching and rash.  Neurological: Negative.  Negative for dizziness, tingling, tremors, sensory change, speech change, focal weakness, seizures, loss of consciousness, weakness and headaches.  Endo/Heme/Allergies: Negative.  Does not bruise/bleed easily.  Psychiatric/Behavioral: Negative.  Negative for depression, suicidal ideas, memory loss and substance abuse. The patient is not nervous/anxious and does not have insomnia.   All other systems reviewed and are negative.  14 point ROS was done and is otherwise as detailed above or  in HPI   PHYSICAL EXAMINATION: ECOG PERFORMANCE STATUS: 1 - Symptomatic but completely ambulatory  Filed Vitals:   11/27/15 1425  BP: 151/60  Pulse: 78  Temp: 97.8 F (36.6 C)  Resp: 18   Filed Weights   11/27/15 1425  Weight: 165 lb (74.844 kg)     Physical Exam  Constitutional: She is oriented to person, place, and time and well-developed, well-nourished, and in no distress.  Appears somewhat younger than stated age  HENT:  Head: Normocephalic and atraumatic.  Nose: Nose normal.  Mouth/Throat: Oropharynx is clear and moist. No oropharyngeal exudate.  Eyes: Conjunctivae and EOM are normal. Pupils are equal, round, and reactive to light. Right eye exhibits no discharge. Left eye exhibits no discharge. No scleral icterus.  Neck: Normal range of motion. Neck supple. No tracheal deviation present. No thyromegaly present.  Cardiovascular: Normal rate, regular rhythm and normal heart sounds.  Exam reveals no gallop and no friction rub.   No murmur heard. Pulmonary/Chest: Effort normal and breath sounds normal. She has no wheezes. She has no rales.  Abdominal: Soft. Bowel sounds are normal. She exhibits no distension and no mass. There is no tenderness. There is no rebound and no guarding.  Musculoskeletal: Normal range of motion. She exhibits no edema.  Lymphadenopathy:    She has no cervical adenopathy.  Neurological: She is alert and oriented to person, place, and time. She has normal reflexes. No cranial nerve deficit. Gait normal. Coordination normal.  Skin: Skin is warm and dry. No rash noted.  Psychiatric: Mood, memory, affect and judgment normal.  Nursing note and vitals reviewed.   LABORATORY DATA:  I have reviewed the data as listed  Results for Kristy, [name] (MRN [id number])   Ref. Range 11/11/2015 11:31 11/27/2015 15:00  WBC Latest Ref Range: 4.0-10.5 K/uL 7.7 7.1  RBC Latest Ref Range: 3.87-5.11 MIL/uL 4.69 4.28  Hemoglobin Latest Ref Range: 12.0-15.0 g/dL 14.0  12.8  HCT Latest Ref Range: 36.0-46.0 % 41.9 38.9  MCV Latest Ref Range: 78.0-100.0 fL 89 90.9  MCH Latest Ref Range: 26.0-34.0 pg 29.9 29.9  MCHC Latest Ref Range: 30.0-36.0 g/dL  33.4 32.9  RDW Latest Ref Range: 11.5-15.5 % 14.8 13.9  Platelets Latest Ref Range: 150-400 K/uL 513 (H) 414 (H)  Neutrophils Latest Units: %  64  Lymphocytes Latest Units: %  29  Monocytes Relative Latest Units: %  5  Eosinophil Latest Units: %  1  Basophil Latest Units: %  1  NEUT# Latest Ref Range: 1.7-7.7 K/uL 5.4 4.6  Neutrophils Latest Units: % 69   Lymphocyte # Latest Ref Range: 0.7-4.0 K/uL 1.7 2.1  Monocyte # Latest Ref Range: 0.1-1.0 K/uL  0.4  Monocytes Absolute Latest Ref Range: 0.1-0.9 x10E3/uL 0.4   Eosinophils Absolute Latest Ref Range: 0.0-0.7 K/uL  0.1  Basophils Absolute Latest Ref Range: 0.0-0.1 K/uL 0.0 0.0  Immature Granulocytes Latest Units: % 0   Immature Grans (Abs) Latest Ref Range: 0.0-0.1 x10E3/uL 0.0   Lymphs Latest Units: % 23   Monocytes Latest Units: % 6   Basos Latest Units: % 1   Eos Latest Units: % 1   EOS (ABSOLUTE) Latest Ref Range: 0.0-0.4 x10E3/uL 0.1      CMP     Component Value Date/Time   NA 141 07/30/2015 1139   NA 141 07/26/2015 1202   K 4.0 07/30/2015 1139   CL 101 07/30/2015 1139   CO2 22 07/30/2015 1139   GLUCOSE 190* 07/30/2015 1139   GLUCOSE 167* 07/26/2015 1202   BUN 14 07/30/2015 1139   BUN 15 07/26/2015 1202   CREATININE 0.93 07/30/2015 1139   CALCIUM 9.7 07/30/2015 1139   PROT 6.6 07/30/2015 1139   ALBUMIN 4.5 07/30/2015 1139   AST 20 07/30/2015 1139   ALT 12 07/30/2015 1139   ALKPHOS 66 07/30/2015 1139   BILITOT 0.5 07/30/2015 1139   BILITOT 0.4 10/10/2014 0835   GFRNONAA 57* 07/30/2015 1139   GFRAA 65 07/30/2015 1139   Results for Kristy [name], Kristy [name] (MRN [id number])   Ref. Range 09/04/2014 16:36 03/26/2015 16:40 04/29/2015 12:37  Platelet Count, POC Latest Ref Range: 142-424 K/uL 396.0 432 (A) 507 (A)    RADIOGRAPHIC STUDIES: I have  personally reviewed the radiological images as listed and agreed with the findings in the report.  CLINICAL DATA: Cough and congestion  EXAM: CHEST 2 VIEW  COMPARISON: 09/24/2014  FINDINGS: The heart size and mediastinal contours are within normal limits. Both lungs are clear. The visualized skeletal structures are unremarkable. Large hiatal hernia reidentified.  IMPRESSION: No active cardiopulmonary disease.   Electronically Signed  By: Conchita Paris M.D.  On: 04/29/2015 14:12    ASSESSMENT & PLAN:  Thrombocytosis  I have reassured the patient that the cause of her thrombocyctosis is not her XARELTO. This is a concern of hers.  I discussed causes of thrombocytosis with the patient. The initial diagnostic question is whether thrombocytosis is a reactive phenomenon or a marker for the presence of a hematologic disorder Causes of reactive thrombocytosis include iron deficiency, B12 deficiency, malignancy, rheumatologic disorders, chronic infection, allergies, reaction to medications. Etc. Her thrombocytosis is mild.  We will send a workup for MPD today.   Diagnostic criteria for ET proposed by the [facility]  requires a sustained platelet count of over 450,000/microL . The rate of cytogenetic abnormalities is less than 5 percent, although the JAK2 V617F mutation is seen in approximately 50 percent of patients with ET.  I will bring her back after the New Year and we will go through everything and discuss next steps.  We will order a mammogram for her after the new year.  Orders Placed  This Encounter  Procedures  . CBC with Differential  . JAK2 V617F, Rfx CALR/E12/MPL  . TSH  . Sedimentation rate  . C-reactive protein  . Ferritin  . Iron and TIBC    All questions were answered. The patient knows to call the clinic with any problems, questions or concerns..  This document serves as a record of services personally performed by Ancil Linsey, MD. It was created  on her behalf by Toni Amend, a trained medical scribe. The creation of this record is based on the scribe's personal observations and the provider's statements to them. This document has been checked and approved by the attending provider.  I have reviewed the above documentation for accuracy and completeness, and I agree with the above.  This note was electronically signed.    Molli Hazard, MD  11/27/2015 3:18 PM

## 2015-11-27 NOTE — Progress Notes (Signed)
Kristy [name]'s reason for visit today is for labs as scheduled per MD orders.  Venipuncture performed with a 23 gauge butterfly needle to R Antecubital.  Kristy [name] tolerated procedure well and without incident; questions were answered and patient was discharged.

## 2015-11-27 NOTE — Patient Instructions (Addendum)
[place] at [facility] Discharge Instructions  RECOMMENDATIONS MADE BY THE CONSULTANT AND ANY TEST RESULTS WILL BE SENT TO YOUR REFERRING PHYSICIAN.   Exam completed by Dr Whitney Muse today You are here because your platelets (which helps your blood clot) are just a little bit high We are going to do a work up and get some blood work today Mammogram scheduled in January  Return to see the doctor in 3 weeks.  Please call the clinic if you have any questions or concerns     Thank you for choosing [place] [facility] to provide your oncology and hematology care.  To afford each patient quality time with our provider, please arrive at least 15 minutes before your scheduled appointment time.    You need to re-schedule your appointment should you arrive 10 or more minutes late.  We strive to give you quality time with our providers, and arriving late affects you and other patients whose appointments are after yours.  Also, if you no show three or more times for appointments you may be dismissed from the clinic at the providers discretion.     Again, thank you for choosing [facility].  Our hope is that these requests will decrease the amount of time that you wait before being seen by our physicians.       _____________________________________________________________  Should you have questions after your visit to [facility], please contact our office at [phone] between the hours of 8:30 a.m. and 4:30 p.m.  Voicemails left after 4:30 p.m. will not be returned until the following business day.  For prescription refill requests, have your pharmacy contact our office.

## 2015-11-28 ENCOUNTER — Other Ambulatory Visit: Payer: Self-pay | Admitting: Family Medicine

## 2015-11-28 LAB — IRON AND TIBC
IRON: 80 ug/dL (ref 28–170)
SATURATION RATIOS: 25 % (ref 10.4–31.8)
TIBC: 319 ug/dL (ref 250–450)
UIBC: 239 ug/dL

## 2015-11-28 LAB — C-REACTIVE PROTEIN: CRP: 0.8 mg/dL (ref <1.0)

## 2015-11-28 LAB — FERRITIN: Ferritin: 35 ng/mL (ref 11–307)

## 2015-12-02 DIAGNOSIS — S61012A Laceration without foreign body of left thumb without damage to nail, initial encounter: Secondary | ICD-10-CM | POA: Diagnosis not present

## 2015-12-06 LAB — JAK2 V617F, W REFLEX TO CALR/E12/MPL

## 2015-12-11 ENCOUNTER — Ambulatory Visit (INDEPENDENT_AMBULATORY_CARE_PROVIDER_SITE_OTHER): Payer: Medicare Other | Admitting: Family Medicine

## 2015-12-11 ENCOUNTER — Encounter: Payer: Self-pay | Admitting: Family Medicine

## 2015-12-11 ENCOUNTER — Ambulatory Visit (INDEPENDENT_AMBULATORY_CARE_PROVIDER_SITE_OTHER): Payer: Medicare Other

## 2015-12-11 VITALS — BP 123/64 | HR 75 | Temp 97.5°F | Ht 62.0 in | Wt 159.0 lb

## 2015-12-11 DIAGNOSIS — E559 Vitamin D deficiency, unspecified: Secondary | ICD-10-CM

## 2015-12-11 DIAGNOSIS — I10 Essential (primary) hypertension: Secondary | ICD-10-CM

## 2015-12-11 DIAGNOSIS — IMO0001 Reserved for inherently not codable concepts without codable children: Secondary | ICD-10-CM

## 2015-12-11 DIAGNOSIS — S61012D Laceration without foreign body of left thumb without damage to nail, subsequent encounter: Secondary | ICD-10-CM

## 2015-12-11 DIAGNOSIS — R030 Elevated blood-pressure reading, without diagnosis of hypertension: Secondary | ICD-10-CM

## 2015-12-11 DIAGNOSIS — K219 Gastro-esophageal reflux disease without esophagitis: Secondary | ICD-10-CM

## 2015-12-11 DIAGNOSIS — Z8781 Personal history of (healed) traumatic fracture: Secondary | ICD-10-CM

## 2015-12-11 DIAGNOSIS — Z780 Asymptomatic menopausal state: Secondary | ICD-10-CM

## 2015-12-11 DIAGNOSIS — I480 Paroxysmal atrial fibrillation: Secondary | ICD-10-CM | POA: Diagnosis not present

## 2015-12-11 MED ORDER — ALPRAZOLAM 0.25 MG PO TABS: 0.2500 mg | ORAL_TABLET | Freq: Every day | ORAL | Status: DC | PRN

## 2015-12-11 MED ORDER — ESTRADIOL 0.025 MG/24HR TD PTWK: 0.0250 mg | MEDICATED_PATCH | TRANSDERMAL | Status: DC

## 2015-12-11 NOTE — Progress Notes (Signed)
Subjective:    Patient ID: Kristy [name], female    DOB: 24-Aug-1928, 82 y.o.   MRN: [id number]  HPI Pt here for follow up and management of chronic medical problems which includes hypertension and a fib. She is taking medications regularly. The patient is doing well today overall. Her biggest issues are her large hiatal hernia, her atrial fib, and her osteoporosis. She is also caring for her husband who has a salivary gland cancer. This is very stressful on her and she is very emotional as expected about this. She also sustained a laceration to her left thumb shortly after Christmas and went to urgent care to have sutures placed. We will remove the sutures today. The patient denies chest pain shortness of breath trouble swallowing heartburn indigestion and nausea vomiting diarrhea or blood in the stool. She does have a history of several fractures. She has not fallen recently. She is passing her water without problems.       Patient Active Problem List   Diagnosis Date Noted  . PAF (paroxysmal atrial fibrillation) ([place]) 12/18/2013  . Cervical spondylosis without myelopathy 04/27/2013  . ABDOMINAL PAIN, UNSPECIFIED SITE 02/02/2011  . Palpitations 02/26/2010  . COLONIC POLYPS, HYPERPLASTIC 12/07/2007  . LIPOMA 12/07/2007  . OBESITY 12/07/2007  . HYPERTENSION 12/07/2007  . GERD 12/07/2007  . HIATAL HERNIA 12/07/2007  . DIVERTICULOSIS, COLON 12/07/2007  . FATTY LIVER DISEASE 12/07/2007  . ENDOMETRIAL POLYP 12/07/2007  . COMPLEX ENDOMETRIAL HYPERPLASIA WITHOUT ATYPIA 12/07/2007  . SOB 12/07/2007   Outpatient Encounter Prescriptions as of 12/11/2015  Medication Sig  . ALPRAZolam (XANAX) 0.25 MG tablet Take 1 tablet (0.25 mg total) by mouth daily as needed for anxiety.  . calcium-vitamin D 250-100 MG-UNIT tablet Take 1-2 tablets by mouth daily.  . Cholecalciferol (VITAMIN D3) 2000 UNITS TABS Take by mouth daily.  Marland Kitchen estradiol (CLIMARA - DOSED IN MG/24 HR) 0.025 mg/24hr patch Place 1 patch  (0.025 mg total) onto the skin once a week.  . hydrochlorothiazide (HYDRODIURIL) 12.5 MG tablet Take 1 tablet (12.5 mg total) by mouth daily.  . metoprolol succinate (TOPROL-XL) 25 MG 24 hr tablet TAKE ONE TABLET BY MOUTH ONE TIME DAILY  . Multiple Vitamin (MULTIVITAMIN) tablet Take 1 tablet by mouth daily.    Marland Kitchen omeprazole (PRILOSEC) 20 MG capsule TAKE ONE CAPSULE BY MOUTH TWICE DAILY  . ONE TOUCH ULTRA TEST test strip USE TO TEST SUGAR TWICE A DAY AS INSTRUCTED  . Polyethyl Glycol-Propyl Glycol (SYSTANE OP) Apply 1-2 drops to eye 4 (four) times daily as needed (dry eyes).  . progesterone (PROMETRIUM) 200 MG capsule 12 tabs every four months  . sertraline (ZOLOFT) 50 MG tablet TAKE ONE TABLET BY MOUTH ONE TIME DAILY  . XARELTO 15 MG TABS tablet TAKE 1 TABLET (15 MG TOTAL) BY MOUTH DAILY.  . [DISCONTINUED] ALPRAZolam (XANAX) 0.25 MG tablet Take 1 tablet (0.25 mg total) by mouth daily as needed for anxiety.  . [DISCONTINUED] estradiol (CLIMARA - DOSED IN MG/24 HR) 0.025 mg/24hr patch Place 0.025 mg onto the skin once a week.  . [DISCONTINUED] diclofenac sodium (VOLTAREN) 1 % GEL Apply 4 g topically 4 (four) times daily as needed. (Patient not taking: Reported on 11/27/2015)   No facility-administered encounter medications on file as of 12/11/2015.     Review of Systems  Constitutional: Negative.   HENT: Negative.   Eyes: Negative.   Respiratory: Negative.   Cardiovascular: Negative.   Gastrointestinal: Negative.   Endocrine: Negative.   Genitourinary: Negative.  Musculoskeletal: Negative.   Skin: Negative.   Allergic/Immunologic: Negative.   Neurological: Negative.   Hematological: Negative.   Psychiatric/Behavioral: Negative.        Objective:   Physical Exam  Constitutional: She is oriented to person, place, and time. She appears well-developed and well-nourished. No distress.  HENT:  Head: Normocephalic.  Right Ear: External ear normal.  Left Ear: External ear normal.  Nose:  Nose normal.  Mouth/Throat: Oropharynx is clear and moist. No oropharyngeal exudate.  Eyes: Conjunctivae and EOM are normal. Pupils are equal, round, and reactive to light. Right eye exhibits no discharge. Left eye exhibits no discharge. No scleral icterus.  Neck: Normal range of motion. Neck supple. No thyromegaly present.  There is no thyromegaly and your cervical adenopathy or bruits auscultated.  Cardiovascular: Normal rate, regular rhythm and normal heart sounds.   No murmur heard. Heart has a regular rate and rhythm at 72/m. No atrial fib today.  Pulmonary/Chest: Effort normal and breath sounds normal. No respiratory distress. She has no wheezes. She has no rales. She exhibits no tenderness.  Clear anteriorly and posteriorly  Abdominal: Soft. Bowel sounds are normal. She exhibits no mass. There is no tenderness. There is no rebound and no guarding.  No abdominal tenderness or organ enlargement or bruits  Musculoskeletal: Normal range of motion. She exhibits no edema.  Lymphadenopathy:    She has no cervical adenopathy.  Neurological: She is alert and oriented to person, place, and time. She has normal reflexes. No cranial nerve deficit.  Skin: Skin is warm and dry. No rash noted.  Sutures were removed from the right thumb and the laceration appears to be gaping open as the edges were not approximated on the initial suturing. Steri-Strips were applied.  Psychiatric: She has a normal mood and affect. Her behavior is normal. Judgment and thought content normal.  Nursing note and vitals reviewed.  BP 123/64 mmHg  Pulse 75  Temp(Src) 97.5 F (36.4 C) (Oral)  Ht 5\' 2"  (1.575 m)  Wt 159 lb (72.122 kg)  BMI 29.07 kg/m2        Assessment & Plan:  1. Vitamin D deficiency -Continue vitamin D and calcium replacement - DG Bone Density; Future  2. Paroxysmal atrial fibrillation (HCC) -Follow-up with cardiology as planned and continue blood thinner  3. Elevated BP -The blood pressure  is good today and she will continue with current treatment  4. History of fracture -Fall prevention, take calcium and vitamin D and get bone density test as planned - DG Bone Density; Future  5. Essential hypertension -Blood pressure is good today continue with current treatment  6. PAF (paroxysmal atrial fibrillation) ([place]) -Follow up as planned with cardiology  7. Gastroesophageal reflux disease, esophagitis presence not specified -Try weaning off of proton pump inhibitor and switching to an H2 antagonists as directed  8. Thumb laceration, left, subsequent encounter -Steri-Strips applied to clean and dry for one week  Meds ordered this encounter  Medications  . ALPRAZolam (XANAX) 0.25 MG tablet    Sig: Take 1 tablet (0.25 mg total) by mouth daily as needed for anxiety.    Dispense:  30 tablet    Refill:  3  . DISCONTD: estradiol (CLIMARA - DOSED IN MG/24 HR) 0.025 mg/24hr patch    Sig: Place 1 patch (0.025 mg total) onto the skin once a week.    Dispense:  4 patch    Refill:  3  . estradiol (CLIMARA - DOSED IN MG/24 HR) 0.025 mg/24hr  patch    Sig: Place 1 patch (0.025 mg total) onto the skin once a week.    Dispense:  4 patch    Refill:  3   Patient Instructions                       Medicare Annual Wellness Visit  [facility] and the medical providers at [facility] strive to bring you the best medical care.  In doing so we not only want to address your current medical conditions and concerns but also to detect new conditions early and prevent illness, disease and health-related problems.    Medicare offers a yearly Wellness Visit which allows our clinical staff to assess your need for preventative services including immunizations, lifestyle education, counseling to decrease risk of preventable diseases and screening for fall risk and other medical concerns.    This visit is provided free of charge (no copay) for all Medicare recipients. The clinical  pharmacists at [facility] have begun to conduct these Wellness Visits which will also include a thorough review of all your medications.    As you primary medical provider recommend that you make an appointment for your Annual Wellness Visit if you have not done so already this year.  You may set up this appointment before you leave today or you may call back [phone] and schedule an appointment.  Please make sure when you call that you mention that you are scheduling your Annual Wellness Visit with the clinical pharmacist so that the appointment may be made for the proper length of time.     Continue current medications. Continue good therapeutic lifestyle changes which include good diet and exercise. Fall precautions discussed with patient. If an FOBT was given today- please return it to our front desk. If you are over 58 years old - you may need Prevnar 85 or the adult Pneumonia vaccine.  **Flu shots are available--- please call and schedule a FLU-CLINIC appointment**  After your visit with [place] today you will receive a survey in the mail or online from [employer] regarding your care with [place]. Please take a moment to fill this out. Your feedback is very important to [place] as you can help [place] better understand your patient needs as well as improve your experience and satisfaction. WE CARE ABOUT YOU!!!   Steri-Strips were applied to the laceration on the left thumb after the sutures were removed to keep the wound closed and the patient was reminded to keep it clean and dry for another few days and a dressing on it. The patient will continue to be careful not put herself at risk for falling She will reduce the use of omeprazole over the next month and try to switch over entirely to ranitidine twice daily after 1 month She'll continue to follow-up with the hematologist regarding her elevated platelet count She'll continue to follow-up with cardiology as needed   Arrie Senate  MD

## 2015-12-11 NOTE — Patient Instructions (Addendum)
Medicare Annual Wellness Visit  [facility] and the medical providers at [facility] strive to bring you the best medical care.  In doing so we not only want to address your current medical conditions and concerns but also to detect new conditions early and prevent illness, disease and health-related problems.    Medicare offers a yearly Wellness Visit which allows our clinical staff to assess your need for preventative services including immunizations, lifestyle education, counseling to decrease risk of preventable diseases and screening for fall risk and other medical concerns.    This visit is provided free of charge (no copay) for all Medicare recipients. The clinical pharmacists at [facility] have begun to conduct these Wellness Visits which will also include a thorough review of all your medications.    As you primary medical provider recommend that you make an appointment for your Annual Wellness Visit if you have not done so already this year.  You may set up this appointment before you leave today or you may call back [phone] and schedule an appointment.  Please make sure when you call that you mention that you are scheduling your Annual Wellness Visit with the clinical pharmacist so that the appointment may be made for the proper length of time.     Continue current medications. Continue good therapeutic lifestyle changes which include good diet and exercise. Fall precautions discussed with patient. If an FOBT was given today- please return it to our front desk. If you are over 54 years old - you may need Prevnar 93 or the adult Pneumonia vaccine.  **Flu shots are available--- please call and schedule a FLU-CLINIC appointment**  After your visit with [place] today you will receive a survey in the mail or online from [employer] regarding your care with [place]. Please take a moment to fill this out. Your feedback is very  important to [place] as you can help [place] better understand your patient needs as well as improve your experience and satisfaction. WE CARE ABOUT YOU!!!   Steri-Strips were applied to the laceration on the left thumb after the sutures were removed to keep the wound closed and the patient was reminded to keep it clean and dry for another few days and a dressing on it. The patient will continue to be careful not put herself at risk for falling She will reduce the use of omeprazole over the next month and try to switch over entirely to ranitidine twice daily after 1 month She'll continue to follow-up with the hematologist regarding her elevated platelet count She'll continue to follow-up with cardiology as needed

## 2015-12-13 ENCOUNTER — Telehealth: Payer: Self-pay | Admitting: *Deleted

## 2015-12-13 MED ORDER — ESTRADIOL 0.025 MG/24HR TD PTWK: 0.0250 mg | MEDICATED_PATCH | TRANSDERMAL | Status: DC

## 2015-12-13 NOTE — Telephone Encounter (Signed)
Needs 90 day of hormone patches

## 2015-12-20 ENCOUNTER — Ambulatory Visit (HOSPITAL_COMMUNITY): Payer: Medicare Other

## 2015-12-20 ENCOUNTER — Encounter (HOSPITAL_COMMUNITY): Payer: Medicare Other | Attending: Hematology & Oncology | Admitting: Hematology & Oncology

## 2015-12-20 ENCOUNTER — Encounter (HOSPITAL_COMMUNITY): Payer: Self-pay | Admitting: Hematology & Oncology

## 2015-12-20 VITALS — BP 178/65 | HR 63 | Temp 98.0°F | Resp 20 | Wt 160.2 lb

## 2015-12-20 DIAGNOSIS — D473 Essential (hemorrhagic) thrombocythemia: Secondary | ICD-10-CM

## 2015-12-20 DIAGNOSIS — Z1589 Genetic susceptibility to other disease: Secondary | ICD-10-CM

## 2015-12-20 DIAGNOSIS — E611 Iron deficiency: Secondary | ICD-10-CM

## 2015-12-20 DIAGNOSIS — D75839 Thrombocytosis, unspecified: Secondary | ICD-10-CM

## 2015-12-20 MED ORDER — HYDROXYUREA 500 MG PO CAPS: 500.0000 mg | ORAL_CAPSULE | Freq: Every day | ORAL | Status: DC

## 2015-12-20 NOTE — Patient Instructions (Signed)
[place] at [facility] Discharge Instructions  RECOMMENDATIONS MADE BY THE CONSULTANT AND ANY TEST RESULTS WILL BE SENT TO YOUR REFERRING PHYSICIAN.  Exam and discussion with Dr. Creed Copper prescription sent to your pharmacy. Wash your hands after handling drug. Lab work in one week. Office visit in 2 weeks.    Thank you for choosing [place] at [facility] to provide your oncology and hematology care.  To afford each patient quality time with our provider, please arrive at least 15 minutes before your scheduled appointment time.    You need to re-schedule your appointment should you arrive 10 or more minutes late.  We strive to give you quality time with our providers, and arriving late affects you and other patients whose appointments are after yours.  Also, if you no show three or more times for appointments you may be dismissed from the clinic at the providers discretion.     Again, thank you for choosing [facility].  Our hope is that these requests will decrease the amount of time that you wait before being seen by our physicians.       _____________________________________________________________  Should you have questions after your visit to [facility], please contact our office at [phone] between the hours of 8:30 a.m. and 4:30 p.m.  Voicemails left after 4:30 p.m. will not be returned until the following business day.  For prescription refill requests, have your pharmacy contact our office.     Hydroxyurea capsules What is this medicine? HYDROXYUREA (hye drox ee yoor EE a) is a chemotherapy drug. This medicine is used to treat certain types of leukemias and head and neck cancer. It is also used to control the painful crises of sickle cell anemia. This medicine may be used for other purposes; ask your health care provider or pharmacist if you have questions. What should I tell my health care  provider before I take this medicine? They need to know if you have any of these conditions: -HIV or AIDS -kidney disease or on hemodialysis -liver disease -low blood counts, like low white cell, platelet, or red cell counts -prior or current interferon therapy -recent or ongoing radiation therapy -an unusual or allergic reaction to hydroxyurea, other chemotherapy, other medicines, foods, dyes, or preservatives -pregnant or trying to get pregnant -breast-feeding How should I use this medicine? Take this medicine by mouth with a glass of water. Follow the directions on the prescription label. Take your medicine at regular intervals. Do not take it more often than directed. Do not stop taking except on your doctor's advice. People who are not taking this medicine should not be exposed to it. Wash your hands before and after handling your bottle or medicine. Caregivers should wear disposable gloves if they must touch the bottle or medicine. Clean up any medicine powder that spills with a damp disposable towel and throw the towel away in a closed container, such as a plastic bag. Talk to your pediatrician regarding the use of this medicine in children. Special care may be needed. Overdosage: If you think you have taken too much of this medicine contact a poison control center or emergency room at once. NOTE: This medicine is only for you. Do not share this medicine with others. What if I miss a dose? If you miss a dose, take it as soon as you can. If it is almost time for your next dose, take only that dose.  Do not take double or extra doses. What may interact with this medicine? This medicine may also interact with the following medications: -didanosine -stavudine -live virus vaccines This list may not describe all possible interactions. Give your health care provider a list of all the medicines, herbs, non-prescription drugs, or dietary supplements you use. Also tell them if you smoke, drink  alcohol, or use illegal drugs. Some items may interact with your medicine. What should I watch for while using this medicine? This drug may make you feel generally unwell. This is not uncommon, as chemotherapy can affect healthy cells as well as cancer cells. Report any side effects. Continue your course of treatment even though you feel ill unless your doctor tells you to stop. You will receive regular blood tests during your treatment. Call your doctor or health care professional for advice if you get a fever, chills or sore throat, or other symptoms of a cold or flu. Do not treat yourself. This drug decreases your body's ability to fight infections. Try to avoid being around people who are sick. This medicine may increase your risk to bruise or bleed. Call your doctor or health care professional if you notice any unusual bleeding. Talk to your doctor about your risk of cancer. You may be more at risk for certain types of cancers if you take this medicine. You may need blood work done while you are taking this medicine. Do not become pregnant while taking this medicine or for at least 6 months after stopping it. Women should inform their doctor if they wish to become pregnant or think they might be pregnant. Men should not father a child while taking this medicine and for at least a year after stopping it. There is a potential for serious side effects to an unborn child. Talk to your health care professional or pharmacist for more information. Do not breast-feed an infant while taking this medicine. This may interfere with the ability to have or father a child. You should talk with your doctor or health care professional if you are concerned about your fertility. What side effects may I notice from receiving this medicine? Side effects that you should report to your doctor or health care professional as soon as possible: -allergic reactions like skin rash, itching or hives, swelling of the face, lips,  or tongue -low blood counts - this medicine may decrease the number of white blood cells, red blood cells and platelets. You may be at increased risk for infections and bleeding. -signs of infection - fever or chills, cough, sore throat, pain or difficulty passing urine -signs of decreased platelets or bleeding - bruising, pinpoint red spots on the skin, black, tarry stools, blood in the urine -signs of decreased red blood cells - unusually weak or tired, fainting spells, lightheadedness -breathing problems -burning, redness or pain at the site of any radiation therapy -changes in skin color -confusion -mouth sores -pain, tingling, numbness in the hands or feet -seizures -skin ulcers -trouble passing urine or change in the amount of urine -vomiting Side effects that usually do not require medical attention (report to your doctor or health care professional if they continue or are bothersome): -headache -loss of appetite -red color to the face This list may not describe all possible side effects. Call your doctor for medical advice about side effects. You may report side effects to FDA at 1-800-FDA-1088. Where should I keep my medicine? Keep out of the reach of children. See product for storage instructions. Each  product may have different instructions. Keep tightly closed. Throw away any unused medicine after the expiration date. NOTE: This sheet is a summary. It may not cover all possible information. If you have questions about this medicine, talk to your doctor, pharmacist, or health care provider.    2016, [employer]vier/[employer] Standard. (2015-03-08 16:51:41)

## 2015-12-20 NOTE — Progress Notes (Signed)
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 female returns to the [place] today with her sister. She is here for follow-up on her jac2+ essential thrombocythemia.   Kristy [name] says she's doing pretty good today. Her only concerns are with regards to her diagnosis, about which she was consulted in-depth. She mostly fears what this diagnosis would imply for her future health. She further wonders if hydrea will interfere with any of her current medications.  She says she's tired all the time, and asks "is that a sign of this?" and was advised that treatment may help alleviate her fatigue.  Appetite, bowels, energy level are all unchanged from her prior visit.    MEDICAL HISTORY:  Past Medical History  Diagnosis Date  . Hypertension   . Obesity   . Colon polyps   . Endometrial polyp   . SOB (shortness of breath)   . Diverticulosis   . GERD (gastroesophageal reflux disease)   . Hiatal hernia   . Endometrial hyperplasia without atypia, simple   . Hyperplastic colon polyp   . Lipoma   . Fatty liver   . A-fib [facility])     SURGICAL HISTORY: Past Surgical History  Procedure Laterality Date  . Cholecystectomy  03/2002  . Umbilical hernia repair  09/2002  . Knee surgery    . Optic neuritis    . Dilation and curettage of uterus      x 2  . Cataract extraction    . Breast biopsy    . Hernia repair      SOCIAL HISTORY: Social History   Social History  . Marital Status: Married    Spouse Name: N/A  . Number of  Children: N/A  . Years of Education: N/A   Occupational History  . [employer]     Retired   Social History Main Topics  . Smoking status: Former Smoker    Quit date: 12/07/1982  . Smokeless tobacco: Never Used  . Alcohol Use: No  . Drug Use: No  . Sexual Activity: Not on file   Other Topics Concern  . Not on file   Social History Narrative  Married 65 years. Her husband is not well, he has dementia; short-term memory is gone He has just had cancer as well and was treated with radiation by Dr. Isidore Moos He still knows her; he still enjoys eating and they still go out to eat and attend church. They used to ballroom dance and she says "oh, we've had a wonderful life."  She worked up past 76. 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 Laurance Flatten.  She says she puffed smoke but never inhaled; she "puffed" while her friends were playing [employer]. She has never had problems with alcohol.  She has two living children, one dead. Her first born was microcephalic. He lived to be 72. When she heard the news of the [place] virus this year, it brought back  all of her sadness about him. She started taking Xanex to alleviate her stress about this matter. Three grandchildren and no great grandchildren.  FAMILY HISTORY: Family History  Problem Relation Age of Onset  . Diabetes Mother   . Heart disease Mother   . Diabetes Sister   . Diabetes Brother   . Heart disease Brother   . Colon cancer Neg Hx    indicated that her mother is deceased. She indicated that her father is deceased. She indicated that her sister is deceased.  Her mother lived to be 39; she was diabetic. She was very healthy except for diabetes and high blood pressure. She got up one morning, she died suddenly. Father lived to be 33; he was well most of his life until the last  6 months, had a stroke. She is the baby of 13 children. She is one of two that are still living.  Her sister is 95 and still living. 6 in the family have lived into the 65's.  ALLERGIES:  is allergic to codeine and tramadol.  MEDICATIONS:  Current Outpatient Prescriptions  Medication Sig Dispense Refill  . ALPRAZolam (XANAX) 0.25 MG tablet Take 1 tablet (0.25 mg total) by mouth daily as needed for anxiety. 30 tablet 3  . calcium-vitamin D 250-100 MG-UNIT tablet Take 1-2 tablets by mouth daily.    . Cholecalciferol (VITAMIN D3) 2000 UNITS TABS Take by mouth daily.    Marland Kitchen estradiol (CLIMARA - DOSED IN MG/24 HR) 0.025 mg/24hr patch Place 1 patch (0.025 mg total) onto the skin once a week. 12 patch 1  . hydrochlorothiazide (HYDRODIURIL) 12.5 MG tablet Take 1 tablet (12.5 mg total) by mouth daily. 90 tablet 2  . metoprolol succinate (TOPROL-XL) 25 MG 24 hr tablet TAKE ONE TABLET BY MOUTH ONE TIME DAILY 90 tablet 3  . Multiple Vitamin (MULTIVITAMIN) tablet Take 1 tablet by mouth daily.      Marland Kitchen omeprazole (PRILOSEC) 20 MG capsule TAKE ONE CAPSULE BY MOUTH TWICE DAILY 60 capsule 4  . ONE TOUCH ULTRA TEST test strip USE TO TEST SUGAR TWICE A DAY AS INSTRUCTED 100 each 2  . Polyethyl Glycol-Propyl Glycol (SYSTANE OP) Apply 1-2 drops to eye 4 (four) times daily as needed (dry eyes).    . progesterone (PROMETRIUM) 200 MG capsule 12 tabs every four months 12 capsule 12  . sertraline (ZOLOFT) 50 MG tablet TAKE ONE TABLET BY MOUTH ONE TIME DAILY 90 tablet 0  . XARELTO 15 MG TABS tablet TAKE 1 TABLET (15 MG TOTAL) BY MOUTH DAILY. 30 tablet 5   No current facility-administered medications for this visit.    Review of Systems  Constitutional: Positive for malaise/fatigue.  HENT: Negative.   Eyes: Negative.   Respiratory: Negative.   Cardiovascular: Negative.   Gastrointestinal: Negative.   Genitourinary: Negative.   Musculoskeletal: Negative.   Skin: Negative.   Neurological: Negative.     Endo/Heme/Allergies: Negative.   Psychiatric/Behavioral: Negative.   All other systems reviewed and are negative.  14 point ROS was done and is otherwise as detailed above or in HPI   PHYSICAL EXAMINATION: ECOG PERFORMANCE STATUS: 1 - Symptomatic but completely ambulatory  Filed Vitals:   12/20/15 0949  BP: 178/65  Pulse: 63  Temp: 98 F (36.7 C)  Resp: 20   Filed Weights   12/20/15 0949  Weight: 160 lb 3.2 oz (72.666 kg)     Physical Exam  Constitutional: She is oriented to person, place, and time and well-developed, well-nourished, and in  no distress.  HENT:  Head: Normocephalic and atraumatic.  Nose: Nose normal.  Mouth/Throat: Oropharynx is clear and moist. No oropharyngeal exudate.  Eyes: Conjunctivae and EOM are normal. Pupils are equal, round, and reactive to light. Right eye exhibits no discharge. Left eye exhibits no discharge. No scleral icterus.  Neck: Normal range of motion. Neck supple. No tracheal deviation present. No thyromegaly present.  Cardiovascular: Normal rate, regular rhythm and normal heart sounds.  Exam reveals no gallop and no friction rub.   No murmur heard. Pulmonary/Chest: Effort normal and breath sounds normal. She has no wheezes. She has no rales.  Abdominal: Soft. Bowel sounds are normal. She exhibits no distension and no mass. There is no tenderness. There is no rebound and no guarding.  Musculoskeletal: Normal range of motion. She exhibits no edema.  Lymphadenopathy:    She has no cervical adenopathy.  Neurological: She is alert and oriented to person, place, and time. She has normal reflexes. No cranial nerve deficit. Gait normal. Coordination normal.  Skin: Skin is warm and dry. No rash noted.  Psychiatric: Mood, memory, affect and judgment normal.  Nursing note and vitals reviewed.     LABORATORY DATA:  I have reviewed the data as listed Lab Results  Component Value Date   WBC 7.1 11/27/2015   HGB 12.8 11/27/2015   HCT 38.9  11/27/2015   MCV 90.9 11/27/2015   PLT 414* 11/27/2015   CMP     Component Value Date/Time   NA 141 07/30/2015 1139   NA 141 07/26/2015 1202   K 4.0 07/30/2015 1139   CL 101 07/30/2015 1139   CO2 22 07/30/2015 1139   GLUCOSE 190* 07/30/2015 1139   GLUCOSE 167* 07/26/2015 1202   BUN 14 07/30/2015 1139   BUN 15 07/26/2015 1202   CREATININE 0.93 07/30/2015 1139   CALCIUM 9.7 07/30/2015 1139   PROT 6.6 07/30/2015 1139   ALBUMIN 4.5 07/30/2015 1139   AST 20 07/30/2015 1139   ALT 12 07/30/2015 1139   ALKPHOS 66 07/30/2015 1139   BILITOT 0.5 07/30/2015 1139   BILITOT 0.4 10/10/2014 0835   GFRNONAA 57* 07/30/2015 1139   GFRAA 65 07/30/2015 1139   ASSESSMENT & PLAN:  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 I addressed her concerns regarding hydrea. I have currently recommended starting with 500mg  hydrea every other day, three days a week, M, W, F. Our goal is to get her platelet count down to less than 400 consistently. I also printed out some reading material for her essential thrombocythemia, and will send her home with some information about hydrea as well.  She will come in next Friday for blood-work, and we will see her back about two weeks after that for follow-up PE and additional labs.   She has mild iron deficiency with last GI evaluation in 2012.   From Up to Date:  The initial management of patients with ET is largely dictated by the risk of thrombotic complications, as calculated by prognostic scores. Important factors in assessing risk of thrombotic complications include a prior history of venous or arterial thrombosis, age over 68 years, JAK2 V617F mutation, and cardiovascular risk factors (eg, hypertension, diabetes mellitus, and current smoking)  Our management strategy takes into account these risk factors and stratifies patients based on the International Prognostic Score of thrombosis in World Health  Organization essential thrombocythemia (IPSET-thrombosis) score, as follows: ?High-risk disease - History of thrombosis at any  age and/or age >56 with a JAK2 V617F mutation  While the presence of cardiovascular risk factors (ie, diabetes mellitus, hypertension, and current smoking) is not incorporated into the IPSET-thrombosis score, we consider these factors when deciding on the overall treatment strategy, including recommendations regarding the use of aspirin  The goals of ET management are to alleviate symptoms and minimize complications of the disease (eg, thrombotic events, bleeding)   When cytoreductive therapy is used, the dose is adjusted to target a platelet count of 100,000 to 400,000/microL   Cytoreductive treatment with hydroxyurea (HU) is recommended for patients with ET of any age with a prior history of venous or arterial thrombosis. It is also suggested for all patients over age 40, especially in those with a JAK2 V617F mutation  Age >60 years and no history of venous or arterial thrombosis - The risk of thrombosis is increased by the presence of a JAK2 V617F mutation. For older adults with JAK2 V617F mutation, we recommend long-term therapy with HU plus aspirin, rather than aspirin alone. For those without a JAK2 V617F mutation, we suggest long-term therapy with HU plus aspirin, although the benefit is less clear. We administer the aspirin once daily for most patients, and offer twice daily aspirin to those who also have cardiovascular risk factors and/or vasomotor symptoms.  Orders Placed This Encounter  Procedures  . CBC with Differential    Standing Status: Standing     Number of Occurrences: 6     Standing Expiration Date: 12/19/2016    All questions were answered. The patient knows to call the clinic with any problems, questions or concerns.  This document serves as a record of services personally performed by Ancil Linsey, MD. It was created on her behalf by Toni Amend, a trained medical scribe. The creation of this record is based on the scribe's personal observations and the provider's statements to them. This document has been checked and approved by the attending provider.  I have reviewed the above documentation for accuracy and completeness, and I agree with the above.  This note was electronically signed.    Molli Hazard, MD  12/20/2015 10:26 AM

## 2015-12-27 ENCOUNTER — Other Ambulatory Visit (HOSPITAL_COMMUNITY): Payer: Medicare Other

## 2015-12-27 ENCOUNTER — Encounter (HOSPITAL_COMMUNITY): Payer: Medicare Other

## 2015-12-27 DIAGNOSIS — D473 Essential (hemorrhagic) thrombocythemia: Secondary | ICD-10-CM

## 2015-12-27 DIAGNOSIS — D75839 Thrombocytosis, unspecified: Secondary | ICD-10-CM

## 2015-12-27 LAB — CBC WITH DIFFERENTIAL/PLATELET
BASOS ABS: 0 10*3/uL (ref 0.0–0.1)
BASOS PCT: 0 %
EOS ABS: 0.1 10*3/uL (ref 0.0–0.7)
Eosinophils Relative: 1 %
HEMATOCRIT: 40.8 % (ref 36.0–46.0)
HEMOGLOBIN: 13.5 g/dL (ref 12.0–15.0)
Lymphocytes Relative: 23 %
Lymphs Abs: 1.6 10*3/uL (ref 0.7–4.0)
MCH: 30 pg (ref 26.0–34.0)
MCHC: 33.1 g/dL (ref 30.0–36.0)
MCV: 90.7 fL (ref 78.0–100.0)
Monocytes Absolute: 0.3 10*3/uL (ref 0.1–1.0)
Monocytes Relative: 4 %
NEUTROS ABS: 5 10*3/uL (ref 1.7–7.7)
NEUTROS PCT: 72 %
Platelets: 425 10*3/uL — ABNORMAL HIGH (ref 150–400)
RBC: 4.5 MIL/uL (ref 3.87–5.11)
RDW: 14.2 % (ref 11.5–15.5)
WBC: 7.1 10*3/uL (ref 4.0–10.5)

## 2016-01-06 ENCOUNTER — Encounter (HOSPITAL_BASED_OUTPATIENT_CLINIC_OR_DEPARTMENT_OTHER): Payer: Medicare Other | Admitting: Hematology & Oncology

## 2016-01-06 ENCOUNTER — Encounter (HOSPITAL_COMMUNITY): Payer: Self-pay | Admitting: Hematology & Oncology

## 2016-01-06 ENCOUNTER — Encounter (HOSPITAL_COMMUNITY): Payer: Medicare Other

## 2016-01-06 VITALS — BP 147/54 | HR 63 | Temp 97.5°F | Resp 16 | Wt 162.0 lb

## 2016-01-06 DIAGNOSIS — D473 Essential (hemorrhagic) thrombocythemia: Secondary | ICD-10-CM | POA: Diagnosis not present

## 2016-01-06 DIAGNOSIS — R7989 Other specified abnormal findings of blood chemistry: Secondary | ICD-10-CM | POA: Diagnosis not present

## 2016-01-06 DIAGNOSIS — D75839 Thrombocytosis, unspecified: Secondary | ICD-10-CM

## 2016-01-06 DIAGNOSIS — N8501 Benign endometrial hyperplasia: Secondary | ICD-10-CM

## 2016-01-06 LAB — CBC WITH DIFFERENTIAL/PLATELET
BASOS PCT: 1 %
Basophils Absolute: 0.1 10*3/uL (ref 0.0–0.1)
Eosinophils Absolute: 0.1 10*3/uL (ref 0.0–0.7)
Eosinophils Relative: 2 %
HEMATOCRIT: 40.5 % (ref 36.0–46.0)
HEMOGLOBIN: 13.3 g/dL (ref 12.0–15.0)
Lymphocytes Relative: 24 %
Lymphs Abs: 1.6 10*3/uL (ref 0.7–4.0)
MCH: 30 pg (ref 26.0–34.0)
MCHC: 32.8 g/dL (ref 30.0–36.0)
MCV: 91.4 fL (ref 78.0–100.0)
MONOS PCT: 7 %
Monocytes Absolute: 0.4 10*3/uL (ref 0.1–1.0)
Neutro Abs: 4.4 10*3/uL (ref 1.7–7.7)
Neutrophils Relative %: 66 %
Platelets: 369 10*3/uL (ref 150–400)
RBC: 4.43 MIL/uL (ref 3.87–5.11)
RDW: 14.6 % (ref 11.5–15.5)
WBC: 6.6 10*3/uL (ref 4.0–10.5)

## 2016-01-06 NOTE — Patient Instructions (Addendum)
[place] at [facility] Discharge Instructions  RECOMMENDATIONS MADE BY THE CONSULTANT AND ANY TEST RESULTS WILL BE SENT TO YOUR REFERRING PHYSICIAN.    Exam and discussion by Dr Whitney Muse today Platelets are 369,000 today We would like to see your platelets less than 400,000 Take your hyrdea Monday and thursdays Lab in 3 weeks  Return to see the doctor in 6 weeks  Please call the clinic if you have any questions or concerns   Thank you for choosing [place] at [facility] to provide your oncology and hematology care.  To afford each patient quality time with our provider, please arrive at least 15 minutes before your scheduled appointment time.   Beginning January 23rd 2017 lab work for the [facility] will be done in the  Main lab at [facility] on 1st floor. If you have a lab appointment with the [facility] please come in thru the  Main Entrance and check in at the main information desk  You need to re-schedule your appointment should you arrive 10 or more minutes late.  We strive to give you quality time with our providers, and arriving late affects you and other patients whose appointments are after yours.  Also, if you no show three or more times for appointments you may be dismissed from the clinic at the providers discretion.     Again, thank you for choosing [facility].  Our hope is that these requests will decrease the amount of time that you wait before being seen by our physicians.       _____________________________________________________________  Should you have questions after your visit to [facility], please contact our office at [phone] between the hours of 8:30 a.m. and 4:30 p.m.  Voicemails left after 4:30 p.m. will not be returned until the following business day.  For prescription refill requests, have your pharmacy contact our office.

## 2016-01-06 NOTE — Progress Notes (Signed)
[place] [facility] [place] Note  Patient Care Team: Chipper Herb, MD as PCP - General (Family Medicine) Minus Breeding, MD as Consulting Physician (Cardiology) Lafayette Dragon, MD as Consulting Physician (Gastroenterology) Clent Jacks, MD as Consulting Physician (Ophthalmology) Raeanne Gathers, AUD (Audiology)  CHIEF COMPLAINTS:  JAK 2 positive ET  HISTORY OF PRESENTING ILLNESS:  Kristy [name] 82 y.o. female is here because of elevated platelets.  Kristy [name] comes to the [place] today with her niece, who she says is "more like a sister."  Day to day, she says she's so tired. "I'm just exhausted."  She says her appetite is "too good."  She denies headaches, blurry vision, bleeding or unusual bruising.   She is taking hydrea three days weekly without any problems, no nausea, or vomiting.   The patient was concerned whether taking xanax would "hurt" her. She took half a pill last week before bed and slept well. She was told by her physician to be careful taking it to avoid falls.    MEDICAL HISTORY:  Past Medical History  Diagnosis Date  . Hypertension   . Obesity   . Colon polyps   . Endometrial polyp   . SOB (shortness of breath)   . Diverticulosis   . GERD (gastroesophageal reflux disease)   . Hiatal hernia   . Endometrial hyperplasia without atypia, simple   . Hyperplastic colon polyp   . Lipoma   . Fatty liver   . A-fib [facility])     SURGICAL HISTORY: Past Surgical History  Procedure Laterality Date  . Cholecystectomy  03/2002  . Umbilical hernia repair  09/2002  . Knee surgery    . Optic neuritis    . Dilation and curettage of uterus      x 2  . Cataract extraction    . Breast biopsy    . Hernia repair      SOCIAL HISTORY: Social History   Social History  . Marital Status: Married    Spouse Name: N/A  . Number of Children: N/A  . Years of Education: N/A   Occupational History  . [employer]     Retired   Social History Main Topics  . Smoking status: Former Smoker    Quit date: 12/07/1982  . Smokeless tobacco: Never Used  . Alcohol Use: No  . Drug Use: No  . Sexual Activity: Not on file   Other Topics Concern  . Not on file   Social History Narrative   Married 65 years. Her husband is not well, he has dementia; short-term memory is gone He has just had cancer as well and was treated with radiation by Dr. Isidore Moos He still knows her; he still enjoys eating and they still go out to eat and attend church. They used to ballroom dance and she says "oh, we've had a wonderful life."  She worked up past 84. 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 Laurance Flatten.  She says she puffed smoke but never inhaled; she "puffed" while her friends were playing [employer]. She has never had problems with alcohol.  She has two living children, one dead. Her first born was microcephalic. He lived to be 34. When she heard the news of the [place] virus this year, it brought back all of her sadness about  him. She started taking Xanex to alleviate her stress about this matter. Three grandchildren and no great grandchildren.  FAMILY HISTORY: Family History  Problem Relation Age of Onset  . Diabetes Mother   . Heart disease Mother   . Diabetes Sister   . Diabetes Brother   . Heart disease Brother   . Colon cancer Neg Hx    indicated that her mother is deceased. She indicated that her father is deceased. She indicated that her sister is deceased.    Her mother lived to be 61; she was diabetic. She was very healthy except for diabetes and high blood pressure. She got up one morning, she died suddenly. Father lived to be 64; he was well most of his life until the last 6 months, had a stroke. She is the baby of 13 children. She is one of two that are still living.  Her  sister is 95 and still living. 6 in the family have lived into the 45's.  ALLERGIES:  is allergic to codeine and tramadol.  MEDICATIONS:  Current Outpatient Prescriptions  Medication Sig Dispense Refill  . ALPRAZolam (XANAX) 0.25 MG tablet Take 1 tablet (0.25 mg total) by mouth daily as needed for anxiety. 30 tablet 3  . calcium-vitamin D 250-100 MG-UNIT tablet Take 1-2 tablets by mouth daily.    . Cholecalciferol (VITAMIN D3) 2000 UNITS TABS Take by mouth daily.    Marland Kitchen estradiol (CLIMARA - DOSED IN MG/24 HR) 0.025 mg/24hr patch Place 1 patch (0.025 mg total) onto the skin once a week. 12 patch 1  . hydrochlorothiazide (HYDRODIURIL) 12.5 MG tablet Take 1 tablet (12.5 mg total) by mouth daily. 90 tablet 2  . hydroxyurea (HYDREA) 500 MG capsule Take 1 capsule (500 mg total) by mouth daily. May take with food to minimize GI side effects. 30 capsule 3  . metoprolol succinate (TOPROL-XL) 25 MG 24 hr tablet TAKE ONE TABLET BY MOUTH ONE TIME DAILY 90 tablet 3  . Multiple Vitamin (MULTIVITAMIN) tablet Take 1 tablet by mouth daily.      Marland Kitchen omeprazole (PRILOSEC) 20 MG capsule TAKE ONE CAPSULE BY MOUTH TWICE DAILY 60 capsule 4  . ONE TOUCH ULTRA TEST test strip USE TO TEST SUGAR TWICE A DAY AS INSTRUCTED 100 each 2  . Polyethyl Glycol-Propyl Glycol (SYSTANE OP) Apply 1-2 drops to eye 4 (four) times daily as needed (dry eyes).    . progesterone (PROMETRIUM) 200 MG capsule 12 tabs every four months 12 capsule 12  . sertraline (ZOLOFT) 50 MG tablet TAKE ONE TABLET BY MOUTH ONE TIME DAILY 90 tablet 0  . XARELTO 15 MG TABS tablet TAKE 1 TABLET (15 MG TOTAL) BY MOUTH DAILY. 30 tablet 5   No current facility-administered medications for this visit.    Review of Systems  Constitutional: Negative for fever, chills and weight loss.  HENT: Negative.  Negative for congestion, hearing loss, nosebleeds, sore throat and tinnitus.   Eyes: Negative.  Negative for blurred vision, double vision, pain and discharge.    Respiratory: Negative.  Negative for cough, hemoptysis, sputum production, shortness of breath and wheezing.   Cardiovascular: Negative.  Negative for chest pain, palpitations, claudication, leg swelling and PND.  Gastrointestinal: Negative for heartburn, nausea, vomiting, abdominal pain, constipation, blood in stool and melena.  Genitourinary: Negative.  Negative for dysuria, urgency, frequency and hematuria.  Musculoskeletal: Negative.  Negative for myalgias, joint pain and falls.  Skin: Negative.  Negative for itching and rash.  Neurological: Negative.  Negative for  dizziness, tingling, tremors, sensory change, speech change, focal weakness, seizures, loss of consciousness, weakness and headaches.  Endo/Heme/Allergies: Negative.  Does not bruise/bleed easily.  Psychiatric/Behavioral: Negative.  Negative for depression, suicidal ideas, memory loss and substance abuse. The patient is not nervous/anxious and does not have insomnia.   All other systems reviewed and are negative.  14 point ROS was done and is otherwise as detailed above or in HPI   PHYSICAL EXAMINATION: ECOG PERFORMANCE STATUS: 1 - Symptomatic but completely ambulatory  Filed Vitals:   01/06/16 0911  BP: 147/54  Pulse: 63  Temp: 97.5 F (36.4 C)  Resp: 16   Filed Weights   01/06/16 0911  Weight: 162 lb (73.483 kg)    Physical Exam  Constitutional: She is oriented to person, place, and time and well-developed, well-nourished, and in no distress.  Appears somewhat younger than stated age  HENT:  Head: Normocephalic and atraumatic.  Nose: Nose normal.  Mouth/Throat: Oropharynx is clear and moist. No oropharyngeal exudate.  Eyes: Conjunctivae and EOM are normal. Pupils are equal, round, and reactive to light. Right eye exhibits no discharge. Left eye exhibits no discharge. No scleral icterus.  Neck: Normal range of motion. Neck supple. No tracheal deviation present. No thyromegaly present.  Cardiovascular: Normal  rate, regular rhythm and normal heart sounds.  Exam reveals no gallop and no friction rub.   No murmur heard. Pulmonary/Chest: Effort normal and breath sounds normal. She has no wheezes. She has no rales.  Abdominal: Soft. Bowel sounds are normal. She exhibits no distension and no mass. There is no tenderness. There is no rebound and no guarding.  Musculoskeletal: Normal range of motion. She exhibits no edema.  Lymphadenopathy:    She has no cervical adenopathy.  Neurological: She is alert and oriented to person, place, and time. She has normal reflexes. No cranial nerve deficit. Gait normal. Coordination normal.  Skin: Skin is warm and dry. No rash noted.  Psychiatric: Mood, memory, affect and judgment normal.  Nursing note and vitals reviewed.   LABORATORY DATA:  I have reviewed the data as listed.  Results for CYLA, HENAO (MRN [id number]) as of 01/06/2016 09:45  Ref. Range 01/06/2016 08:36  WBC Latest Ref Range: 4.0-10.5 K/uL 6.6  RBC Latest Ref Range: 3.87-5.11 MIL/uL 4.43  Hemoglobin Latest Ref Range: 12.0-15.0 g/dL 13.3  HCT Latest Ref Range: 36.0-46.0 % 40.5  MCV Latest Ref Range: 78.0-100.0 fL 91.4  MCH Latest Ref Range: 26.0-34.0 pg 30.0  MCHC Latest Ref Range: 30.0-36.0 g/dL 32.8  RDW Latest Ref Range: 11.5-15.5 % 14.6  Platelets Latest Ref Range: 150-400 K/uL 369  Neutrophils Latest Units: % 66  Lymphocytes Latest Units: % 24  Monocytes Relative Latest Units: % 7  Eosinophil Latest Units: % 2  Basophil Latest Units: % 1  NEUT# Latest Ref Range: 1.7-7.7 K/uL 4.4  Lymphocyte # Latest Ref Range: 0.7-4.0 K/uL 1.6  Monocyte # Latest Ref Range: 0.1-1.0 K/uL 0.4  Eosinophils Absolute Latest Ref Range: 0.0-0.7 K/uL 0.1  Basophils Absolute Latest Ref Range: 0.0-0.1 K/uL 0.1    CMP     Component Value Date/Time   NA 141 07/30/2015 1139   NA 141 07/26/2015 1202   K 4.0 07/30/2015 1139   CL 101 07/30/2015 1139   CO2 22 07/30/2015 1139   GLUCOSE 190* 07/30/2015 1139    GLUCOSE 167* 07/26/2015 1202   BUN 14 07/30/2015 1139   BUN 15 07/26/2015 1202   CREATININE 0.93 07/30/2015 1139   CALCIUM 9.7 07/30/2015  1139   PROT 6.6 07/30/2015 1139   ALBUMIN 4.5 07/30/2015 1139   AST 20 07/30/2015 1139   ALT 12 07/30/2015 1139   ALKPHOS 66 07/30/2015 1139   BILITOT 0.5 07/30/2015 1139   BILITOT 0.4 10/10/2014 0835   GFRNONAA 57* 07/30/2015 1139   GFRAA 65 07/30/2015 1139   Results for ROAN, ALLEGRA (MRN [id number]) as of 01/06/2016 09:45  Ref. Range 11/11/2015 11:31 11/27/2015 15:00 12/11/2015 12:27 12/27/2015 09:56 01/06/2016 08:36  Platelets Latest Ref Range: 150-400 K/uL 513 (H) 414 (H)  425 (H) 369    RADIOGRAPHIC STUDIES: I have personally reviewed the radiological images as listed and agreed with the findings in the report.  CLINICAL DATA: Cough and congestion  EXAM: CHEST 2 VIEW  COMPARISON: 09/24/2014  FINDINGS: The heart size and mediastinal contours are within normal limits. Both lungs are clear. The visualized skeletal structures are unremarkable. Large hiatal hernia reidentified.  IMPRESSION: No active cardiopulmonary disease.   Electronically Signed  By: Conchita Paris M.D.  On: 04/29/2015 14:12    ASSESSMENT & PLAN:  JAK 2 positive ET  Platelet count is now WNL. I discuss lowering her hydrea to twice weekly on Monday and Thursday. We will keep a close watch on her counts moving forward.  I advised her the goal is to keep platelets less than 400 K and not affect her other blood counts.  I will see her again in 6 weeks. In the meantime, she will return in 3 weeks for CBC. She would prefer to have her blood work performed in [place].   All questions were answered. The patient knows to call the clinic with any problems, questions or concerns..  This document serves as a record of services personally performed by Ancil Linsey, MD. It was created on her behalf by Arlyce Harman, a trained medical scribe. The creation of  this record is based on the scribe's personal observations and the provider's statements to them. This document has been checked and approved by the attending provider.  I have reviewed the above documentation for accuracy and completeness, and I agree with the above.  This note was electronically signed.    Molli Hazard, MD  01/06/2016 9:45 AM

## 2016-01-13 ENCOUNTER — Other Ambulatory Visit: Payer: Self-pay | Admitting: *Deleted

## 2016-01-13 MED ORDER — SERTRALINE HCL 50 MG PO TABS: 50.0000 mg | ORAL_TABLET | Freq: Every day | ORAL | Status: DC

## 2016-01-13 MED ORDER — METOPROLOL SUCCINATE ER 25 MG PO TB24: 25.0000 mg | ORAL_TABLET | Freq: Every day | ORAL | Status: DC

## 2016-01-13 MED ORDER — HYDROCHLOROTHIAZIDE 12.5 MG PO TABS: 12.5000 mg | ORAL_TABLET | Freq: Every day | ORAL | Status: DC

## 2016-01-24 DIAGNOSIS — Z1589 Genetic susceptibility to other disease: Secondary | ICD-10-CM | POA: Insufficient documentation

## 2016-01-24 DIAGNOSIS — D473 Essential (hemorrhagic) thrombocythemia: Secondary | ICD-10-CM | POA: Insufficient documentation

## 2016-01-27 ENCOUNTER — Other Ambulatory Visit (INDEPENDENT_AMBULATORY_CARE_PROVIDER_SITE_OTHER): Payer: Medicare Other

## 2016-01-27 ENCOUNTER — Other Ambulatory Visit (HOSPITAL_COMMUNITY): Payer: Medicare Other

## 2016-01-27 DIAGNOSIS — N8501 Benign endometrial hyperplasia: Secondary | ICD-10-CM

## 2016-01-27 NOTE — Progress Notes (Signed)
Lab work for Dr Ancil Linsey CBC N85.01

## 2016-01-28 LAB — CBC WITH DIFFERENTIAL/PLATELET
Basophils Absolute: 0 10*3/uL (ref 0.0–0.2)
Basos: 1 %
EOS (ABSOLUTE): 0.1 10*3/uL (ref 0.0–0.4)
EOS: 1 %
HEMATOCRIT: 38.2 % (ref 34.0–46.6)
HEMOGLOBIN: 13 g/dL (ref 11.1–15.9)
Immature Grans (Abs): 0 10*3/uL (ref 0.0–0.1)
Immature Granulocytes: 0 %
Lymphocytes Absolute: 1.7 10*3/uL (ref 0.7–3.1)
Lymphs: 27 %
MCH: 30 pg (ref 26.6–33.0)
MCHC: 34 g/dL (ref 31.5–35.7)
MCV: 88 fL (ref 79–97)
MONOS ABS: 0.4 10*3/uL (ref 0.1–0.9)
Monocytes: 6 %
NEUTROS PCT: 65 %
Neutrophils Absolute: 4.1 10*3/uL (ref 1.4–7.0)
Platelets: 373 10*3/uL (ref 150–379)
RBC: 4.33 x10E6/uL (ref 3.77–5.28)
RDW: 15.4 % (ref 12.3–15.4)
WBC: 6.3 10*3/uL (ref 3.4–10.8)

## 2016-01-29 ENCOUNTER — Encounter: Payer: Self-pay | Admitting: Cardiology

## 2016-01-29 ENCOUNTER — Ambulatory Visit (INDEPENDENT_AMBULATORY_CARE_PROVIDER_SITE_OTHER): Payer: Medicare Other | Admitting: Cardiology

## 2016-01-29 VITALS — BP 150/80 | HR 60 | Ht 62.0 in | Wt 162.0 lb

## 2016-01-29 DIAGNOSIS — R0989 Other specified symptoms and signs involving the circulatory and respiratory systems: Secondary | ICD-10-CM | POA: Diagnosis not present

## 2016-01-29 DIAGNOSIS — I480 Paroxysmal atrial fibrillation: Secondary | ICD-10-CM | POA: Diagnosis not present

## 2016-01-29 DIAGNOSIS — R002 Palpitations: Secondary | ICD-10-CM

## 2016-01-29 NOTE — Patient Instructions (Signed)
Medication Instructions:  The current medical regimen is effective;  continue present plan and medications.  Testing/Procedures: Your physician has requested that you have a carotid duplex. This test is an ultrasound of the carotid arteries in your neck. It looks at blood flow through these arteries that supply the brain with blood. Allow one hour for this exam. There are no restrictions or special instructions.  Your physician has requested that you have an echocardiogram. Echocardiography is a painless test that uses sound waves to create images of your heart. It provides your doctor with information about the size and shape of your heart and how well your heart's chambers and valves are working. This procedure takes approximately one hour. There are no restrictions for this procedure.  Follow-Up: Follow up in 6 months with Dr. Percival Spanish.  You will receive a letter in the mail 2 months before you are due.  Please call us when you receive this letter to schedule your follow up appointment.  If you need a refill on your cardiac medications before your next appointment, please call your pharmacy.  Thank you for choosing [facility]!!

## 2016-01-29 NOTE — Progress Notes (Signed)
HPI Patient presents for follow up of atrial fib.   She had been managed with Xarelto.  She has had no further symptomatic recurrence of this. She denies any chest pressure, neck or arm discomfort. She has no PND or orthopnea.  She did have some pulmonary HTN and TR on echo.   Since I last saw her she had been doing well although she takes care of her chronically ill husband who was just diagnosed with parotid cancer.  She reports an episode of everything going black the other day.  She does not report frank syncope although she is not sure.  She said it might have been like a curtain drawn across both eyes.  She has never had this before and never since.   The patient denies any new symptoms such as chest discomfort, neck or arm discomfort. There has been no new shortness of breath, PND or orthopnea. There have been no reported palpitations,.  Allergies  Allergen Reactions  . Codeine Nausea Only  . Tramadol Nausea Only    Current Outpatient Prescriptions  Medication Sig Dispense Refill  . ALPRAZolam (XANAX) 0.25 MG tablet Take 1 tablet (0.25 mg total) by mouth daily as needed for anxiety. 30 tablet 3  . calcium-vitamin D 250-100 MG-UNIT tablet Take 1-2 tablets by mouth daily.    . Cholecalciferol (VITAMIN D3) 2000 UNITS TABS Take by mouth daily.    Marland Kitchen estradiol (CLIMARA - DOSED IN MG/24 HR) 0.025 mg/24hr patch Place 1 patch (0.025 mg total) onto the skin once a week. 12 patch 1  . hydrochlorothiazide (HYDRODIURIL) 12.5 MG tablet Take 1 tablet (12.5 mg total) by mouth daily. 30 tablet 0  . hydroxyurea (HYDREA) 500 MG capsule Take 500 mg by mouth 2 (two) times a week. May take with food to minimize GI side effects.    . metoprolol succinate (TOPROL-XL) 25 MG 24 hr tablet Take 1 tablet (25 mg total) by mouth daily. 30 tablet 0  . Multiple Vitamin (MULTIVITAMIN) tablet Take 1 tablet by mouth daily.      Marland Kitchen omeprazole (PRILOSEC) 20 MG capsule TAKE ONE CAPSULE BY MOUTH TWICE DAILY 60 capsule 4  .  Polyethyl Glycol-Propyl Glycol (SYSTANE OP) Apply 1-2 drops to eye 4 (four) times daily as needed (dry eyes).    . progesterone (PROMETRIUM) 200 MG capsule 12 tabs every four months 12 capsule 12  . sertraline (ZOLOFT) 50 MG tablet Take 1 tablet (50 mg total) by mouth daily. 90 tablet 0  . XARELTO 15 MG TABS tablet TAKE 1 TABLET (15 MG TOTAL) BY MOUTH DAILY. 30 tablet 5  . ONE TOUCH ULTRA TEST test strip USE TO TEST SUGAR TWICE A DAY AS INSTRUCTED 100 each 2   No current facility-administered medications for this visit.    Past Medical History  Diagnosis Date  . Hypertension   . Obesity   . Colon polyps   . Endometrial polyp   . SOB (shortness of breath)   . Diverticulosis   . GERD (gastroesophageal reflux disease)   . Hiatal hernia   . Endometrial hyperplasia without atypia, simple   . Hyperplastic colon polyp   . Lipoma   . Fatty liver   . A-fib [facility])     Past Surgical History  Procedure Laterality Date  . Cholecystectomy  03/2002  . Umbilical hernia repair  09/2002  . Knee surgery    . Optic neuritis    . Dilation and curettage of uterus      x  2  . Cataract extraction    . Breast biopsy    . Hernia repair      ROS:  As stated in the HPI and negative for all other systems.  PHYSICAL EXAM BP 150/80 mmHg  Pulse 60  Ht 5\' 2"  (1.575 m)  Wt 162 lb (73.483 kg)  BMI 29.62 kg/m2 GENERAL:  Well appearing NECK:  No jugular venous distention, waveform within normal limits, carotid upstroke brisk and symmetric, no bruits, no thyromegaly LUNGS:  Clear to auscultation bilaterally CHEST:  Unremarkable HEART:  PMI not displaced or sustained,S1 and S2 within normal limits, no S3, no S4, no clicks, no rubs, no murmurs ABD:  Flat, positive bowel sounds normal in frequency in pitch, no bruits, no rebound, no guarding, no midline pulsatile mass, no hepatomegaly, no splenomegaly EXT:  2 plus pulses throughout, no edema, no cyanosis no clubbing   EKG:  Sinus rhythm, rate 65 left  axis deviation, left anterior fascicular block, poor anterior R wave progression, first degree AV block, no acute ST-T wave changes.  01/29/2016   ASSESSMENT AND PLAN  ATRIAL FIB:  She has had no symptomatic recurrence of this.  Ms. AIDEN BIRCHETT has a CHA2DS2 - VASc score of 5 with a risk of stroke of 6.7% .  She is doing well with anticoagulation and does not seem to have any symptomatic recurrence.    HTN:  The blood pressure is at target. No change in medications is indicated. We will continue with therapeutic lifestyle changes (TLC).  DYSPNEA:  This is only with exertion.  I will however, check an echo to follow up her pulm HTN and TR  PRESYNCOPE:  She had the episode as described.  I will check a Doppler and evaluate with the echo above.  If she has recurrence she will likely need a monitor.

## 2016-01-31 ENCOUNTER — Telehealth (HOSPITAL_COMMUNITY): Payer: Self-pay

## 2016-01-31 ENCOUNTER — Telehealth (HOSPITAL_COMMUNITY): Payer: Self-pay | Admitting: *Deleted

## 2016-01-31 NOTE — Telephone Encounter (Signed)
Patient's phone call was returned to discuss lab results.  Lab results were given and discussed and patient verbalized understanding.  She knows to return in March as scheduled.

## 2016-02-04 ENCOUNTER — Other Ambulatory Visit: Payer: Self-pay | Admitting: *Deleted

## 2016-02-04 ENCOUNTER — Ambulatory Visit (HOSPITAL_COMMUNITY)
Admission: RE | Admit: 2016-02-04 | Discharge: 2016-02-04 | Disposition: A | Discharge status: Home / Self Care | Payer: Medicare Other | Source: Ambulatory Visit | Attending: Cardiology | Admitting: Cardiology

## 2016-02-04 DIAGNOSIS — I351 Nonrheumatic aortic (valve) insufficiency: Secondary | ICD-10-CM | POA: Diagnosis not present

## 2016-02-04 DIAGNOSIS — I480 Paroxysmal atrial fibrillation: Secondary | ICD-10-CM | POA: Diagnosis not present

## 2016-02-04 DIAGNOSIS — I6523 Occlusion and stenosis of bilateral carotid arteries: Secondary | ICD-10-CM | POA: Diagnosis not present

## 2016-02-04 DIAGNOSIS — R002 Palpitations: Secondary | ICD-10-CM | POA: Diagnosis not present

## 2016-02-04 DIAGNOSIS — R0989 Other specified symptoms and signs involving the circulatory and respiratory systems: Secondary | ICD-10-CM | POA: Insufficient documentation

## 2016-02-04 DIAGNOSIS — I071 Rheumatic tricuspid insufficiency: Secondary | ICD-10-CM | POA: Diagnosis not present

## 2016-02-04 DIAGNOSIS — Z87891 Personal history of nicotine dependence: Secondary | ICD-10-CM | POA: Diagnosis not present

## 2016-02-04 DIAGNOSIS — I10 Essential (primary) hypertension: Secondary | ICD-10-CM | POA: Diagnosis not present

## 2016-02-04 DIAGNOSIS — I4891 Unspecified atrial fibrillation: Secondary | ICD-10-CM | POA: Diagnosis present

## 2016-02-04 MED ORDER — ALPRAZOLAM 0.25 MG PO TABS: 0.2500 mg | ORAL_TABLET | Freq: Every day | ORAL | Status: DC | PRN

## 2016-02-04 NOTE — Telephone Encounter (Signed)
Last filled 01/11/16, last seen 12/11/15, call in at [facility] rx

## 2016-02-06 ENCOUNTER — Telehealth: Payer: Self-pay | Admitting: Cardiology

## 2016-02-06 NOTE — Telephone Encounter (Signed)
New message ° ° ° ° ° °Calling to get test results °

## 2016-02-06 NOTE — Telephone Encounter (Signed)
Spoke with pt, aware echo and carotid results have not been reviewed by dr hochrein yet. Preliminary report given to the pt.

## 2016-02-19 ENCOUNTER — Encounter (HOSPITAL_COMMUNITY): Payer: Medicare Other | Attending: Hematology & Oncology | Admitting: Hematology & Oncology

## 2016-02-19 ENCOUNTER — Other Ambulatory Visit (HOSPITAL_COMMUNITY)
Admission: RE | Admit: 2016-02-19 | Discharge: 2016-02-19 | Disposition: A | Discharge status: Home / Self Care | Payer: Medicare Other | Source: Ambulatory Visit | Attending: Hematology & Oncology | Admitting: Hematology & Oncology

## 2016-02-19 ENCOUNTER — Encounter (HOSPITAL_COMMUNITY): Payer: Medicare Other

## 2016-02-19 ENCOUNTER — Encounter (HOSPITAL_COMMUNITY): Payer: Self-pay | Admitting: Hematology & Oncology

## 2016-02-19 VITALS — BP 145/58 | HR 71 | Temp 97.8°F | Resp 18 | Wt 164.0 lb

## 2016-02-19 DIAGNOSIS — D473 Essential (hemorrhagic) thrombocythemia: Secondary | ICD-10-CM

## 2016-02-19 DIAGNOSIS — N8501 Benign endometrial hyperplasia: Secondary | ICD-10-CM | POA: Insufficient documentation

## 2016-02-19 DIAGNOSIS — D75839 Thrombocytosis, unspecified: Secondary | ICD-10-CM

## 2016-02-19 DIAGNOSIS — Z1589 Genetic susceptibility to other disease: Secondary | ICD-10-CM

## 2016-02-19 LAB — CBC WITH DIFFERENTIAL/PLATELET
BASOS ABS: 0 10*3/uL (ref 0.0–0.1)
BASOS PCT: 0 %
Eosinophils Absolute: 0.1 10*3/uL (ref 0.0–0.7)
Eosinophils Relative: 1 %
HEMATOCRIT: 40.1 % (ref 36.0–46.0)
HEMOGLOBIN: 13.5 g/dL (ref 12.0–15.0)
Lymphocytes Relative: 25 %
Lymphs Abs: 1.9 10*3/uL (ref 0.7–4.0)
MCH: 31 pg (ref 26.0–34.0)
MCHC: 33.7 g/dL (ref 30.0–36.0)
MCV: 92 fL (ref 78.0–100.0)
Monocytes Absolute: 0.5 10*3/uL (ref 0.1–1.0)
Monocytes Relative: 7 %
NEUTROS ABS: 5 10*3/uL (ref 1.7–7.7)
NEUTROS PCT: 67 %
Platelets: 387 10*3/uL (ref 150–400)
RBC: 4.36 MIL/uL (ref 3.87–5.11)
RDW: 14.8 % (ref 11.5–15.5)
WBC: 7.5 10*3/uL (ref 4.0–10.5)

## 2016-02-19 MED ORDER — HYDROXYUREA 500 MG PO CAPS: 500.0000 mg | ORAL_CAPSULE | ORAL | Status: DC

## 2016-02-19 NOTE — Progress Notes (Signed)
[facility] [place] Note  Patient Care Team: Chipper Herb, MD as PCP - General (Family Medicine) Minus Breeding, MD as Consulting Physician (Cardiology) [place] Dragon, MD as Consulting Physician (Gastroenterology) Clent Jacks, MD as Consulting Physician (Ophthalmology) Raeanne Gathers, AUD (Audiology)  CHIEF COMPLAINTS:  JAK 2 positive ET  HISTORY OF PRESENTING ILLNESS:  Kristy [name] 82 y.o. female is here for further follow-up of JAK 2 positive ET.  Mrs. Buford is here alone. I personally reviewed and went over laboratory studies taken on 01/27/16 with the patient.   She follows with her cardiologist regularly and spoke with them about an episode of pure blackness that came over her while using her electronic "tablet." She was found to have a leak in her tricuspid valve.  She has been eating "too much", with 2 lbs weight gain since our last visit. She notes that her appetite is too good and that her kids buy her a lot of unhealthy food.  Reports vision change, and wonders if hydrea caused this. States she uses her tablet a lot. She is scheduled to see her eye doctor in May.   She denies bleeding, bruising, headaches, nausea or change in bowel habits.   MEDICAL HISTORY:  Past Medical History  Diagnosis Date  . Hypertension   . Obesity   . Colon polyps   . Endometrial polyp   . SOB (shortness of breath)   . Diverticulosis   . GERD (gastroesophageal reflux disease)   . Hiatal hernia   . Endometrial hyperplasia without atypia, simple   . Hyperplastic colon polyp   . Lipoma   . Fatty liver   . A-fib [facility])     SURGICAL HISTORY: Past Surgical History  Procedure Laterality Date  . Cholecystectomy  03/2002  . Umbilical hernia repair  09/2002  . Knee surgery    . Optic neuritis    . Dilation and curettage of uterus      x 2  . Cataract extraction    . Breast biopsy    . Hernia repair      SOCIAL HISTORY: Social History   Social History   . Marital Status: Married    Spouse Name: N/A  . Number of Children: N/A  . Years of Education: N/A   Occupational History  . [place]     Retired   Social History Main Topics  . Smoking status: Former Smoker    Quit date: 12/07/1982  . Smokeless tobacco: Never Used  . Alcohol Use: No  . Drug Use: No  . Sexual Activity: Not on file   Other Topics Concern  . Not on file   Social History Narrative   Married 65 years. Her husband is not well, he has dementia; short-term memory is gone He has just had cancer as well and was treated with radiation by Dr. Isidore Moos He still knows her; he still enjoys eating and they still go out to eat and attend church. They used to ballroom dance and she says "oh, we've had a wonderful life."  She worked up past 28. 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 Laurance Flatten.  She says she puffed smoke but never inhaled; she "puffed" while her friends were playing [employer]. She has never had problems with alcohol.  She has two  living children, one dead. Her first born was microcephalic. He lived to be 35. When she heard the news of the [place] virus this year, it brought back all of her sadness about him. She started taking Xanex to alleviate her stress about this matter. Three grandchildren and no great grandchildren.  FAMILY HISTORY: Family History  Problem Relation Age of Onset  . Diabetes Mother   . Heart disease Mother   . Diabetes Sister   . Diabetes Brother   . Heart disease Brother   . Colon cancer Neg Hx    indicated that her mother is deceased. She indicated that her father is deceased. She indicated that her sister is deceased.    Her mother lived to be 28; she was diabetic. She was very healthy except for diabetes and high blood pressure. She got up one morning, she died  suddenly. Father lived to be 70; he was well most of his life until the last 6 months, had a stroke. She is the baby of 13 children. She is one of two that are still living.  Her sister is 95 and still living. 6 in the family have lived into the 89's.  ALLERGIES:  is allergic to codeine and tramadol.  MEDICATIONS:  Current Outpatient Prescriptions  Medication Sig Dispense Refill  . ALPRAZolam (XANAX) 0.25 MG tablet Take 1 tablet (0.25 mg total) by mouth daily as needed for anxiety. 30 tablet 2  . calcium-vitamin D 250-100 MG-UNIT tablet Take 1-2 tablets by mouth daily.    . Cholecalciferol (VITAMIN D3) 2000 UNITS TABS Take by mouth daily.    Marland Kitchen estradiol (CLIMARA - DOSED IN MG/24 HR) 0.025 mg/24hr patch Place 1 patch (0.025 mg total) onto the skin once a week. 12 patch 1  . hydrochlorothiazide (HYDRODIURIL) 12.5 MG tablet Take 1 tablet (12.5 mg total) by mouth daily. 30 tablet 0  . hydroxyurea (HYDREA) 500 MG capsule Take 1 capsule (500 mg total) by mouth 2 (two) times a week. May take with food to minimize GI side effects. 30 capsule 1  . metoprolol succinate (TOPROL-XL) 25 MG 24 hr tablet Take 1 tablet (25 mg total) by mouth daily. 30 tablet 0  . Multiple Vitamin (MULTIVITAMIN) tablet Take 1 tablet by mouth daily.      Marland Kitchen omeprazole (PRILOSEC) 20 MG capsule TAKE ONE CAPSULE BY MOUTH TWICE DAILY 60 capsule 4  . ONE TOUCH ULTRA TEST test strip USE TO TEST SUGAR TWICE A DAY AS INSTRUCTED 100 each 2  . Polyethyl Glycol-Propyl Glycol (SYSTANE OP) Apply 1-2 drops to eye 4 (four) times daily as needed (dry eyes).    . progesterone (PROMETRIUM) 200 MG capsule 12 tabs every four months 12 capsule 12  . sertraline (ZOLOFT) 50 MG tablet Take 1 tablet (50 mg total) by mouth daily. 90 tablet 0  . XARELTO 15 MG TABS tablet TAKE 1 TABLET (15 MG TOTAL) BY MOUTH DAILY. 30 tablet 5   No current facility-administered medications for this visit.    Review of Systems  Constitutional: Negative for fever,  chills and weight loss.  HENT: Negative.  Negative for congestion, hearing loss, nosebleeds, sore throat and tinnitus.   Eyes: Negative.  Negative for blurred vision, double vision, pain and discharge.  Respiratory: Negative.  Negative for cough, hemoptysis, sputum production, shortness of breath and wheezing.   Cardiovascular: Negative.  Negative for chest pain, palpitations, claudication, leg swelling and PND.  Gastrointestinal: Negative for heartburn, nausea, vomiting, abdominal pain, constipation, blood in stool and  melena.  Genitourinary: Negative.  Negative for dysuria, urgency, frequency and hematuria.  Musculoskeletal: Negative.  Negative for myalgias, joint pain and falls.  Skin: Negative.  Negative for itching and rash.  Neurological: Negative.  Negative for dizziness, tingling, tremors, sensory change, speech change, focal weakness, seizures, loss of consciousness, weakness and headaches.  Endo/Heme/Allergies: Negative.  Does not bruise/bleed easily.  Psychiatric/Behavioral: Negative.  Negative for depression, suicidal ideas, memory loss and substance abuse. The patient is not nervous/anxious and does not have insomnia.   All other systems reviewed and are negative.  14 point ROS was done and is otherwise as detailed above or in HPI   PHYSICAL EXAMINATION: ECOG PERFORMANCE STATUS: 1 - Symptomatic but completely ambulatory  Filed Vitals:   02/19/16 1109  BP: 145/58  Pulse: 71  Temp: 97.8 F (36.6 C)  Resp: 18   Filed Weights   02/19/16 1109  Weight: 164 lb (74.39 kg)    Physical Exam  Constitutional: She is oriented to person, place, and time and well-developed, well-nourished, and in no distress.  Appears somewhat younger than stated age  HENT:  Head: Normocephalic and atraumatic.  Nose: Nose normal.  Mouth/Throat: Oropharynx is clear and moist. No oropharyngeal exudate.  Eyes: Conjunctivae and EOM are normal. Pupils are equal, round, and reactive to light. Right  eye exhibits no discharge. Left eye exhibits no discharge. No scleral icterus.  Neck: Normal range of motion. Neck supple. No tracheal deviation present. No thyromegaly present.  Cardiovascular: Normal rate, regular rhythm and normal heart sounds.  Exam reveals no gallop and no friction rub.   No murmur heard. Pulmonary/Chest: Effort normal and breath sounds normal. She has no wheezes. She has no rales.  Abdominal: Soft. Bowel sounds are normal. She exhibits no distension and no mass. There is no tenderness. There is no rebound and no guarding.  Musculoskeletal: Normal range of motion. She exhibits no edema.  Lymphadenopathy:    She has no cervical adenopathy.  Neurological: She is alert and oriented to person, place, and time. She has normal reflexes. No cranial nerve deficit. Gait normal. Coordination normal.  Skin: Skin is warm and dry. No rash noted.  Psychiatric: Mood, memory, affect and judgment normal.  Nursing note and vitals reviewed.   LABORATORY DATA:  I have reviewed the data as listed. Results for SHANIN, PARRILLI (MRN [id number]) as of 02/19/2016 10:47  Ref. Range 01/27/2016 12:36  WBC Latest Ref Range: 3.4-10.8 x10E3/uL 6.3  RBC Latest Ref Range: 3.77-5.28 x10E6/uL 4.33  Hemoglobin Latest Ref Range: 11.1-15.9 g/dL 13.0  HCT Latest Ref Range: 34.0-46.6 % 38.2  MCV Latest Ref Range: 79-97 fL 88  MCH Latest Ref Range: 26.6-33.0 pg 30.0  MCHC Latest Ref Range: 31.5-35.7 g/dL 34.0  RDW Latest Ref Range: 12.3-15.4 % 15.4  Platelets Latest Ref Range: 150-379 x10E3/uL 373  NEUT# Latest Ref Range: 1.4-7.0 x10E3/uL 4.1  Neutrophils Latest Units: % 65  Lymphocyte # Latest Ref Range: 0.7-3.1 x10E3/uL 1.7  Monocytes Absolute Latest Ref Range: 0.1-0.9 x10E3/uL 0.4  Basophils Absolute Latest Ref Range: 0.0-0.2 x10E3/uL 0.0  Immature Granulocytes Latest Units: % 0  Immature Grans (Abs) Latest Ref Range: 0.0-0.1 x10E3/uL 0.0  Lymphs Latest Units: % 27  Monocytes Latest Units: % 6  Basos  Latest Units: % 1  Eos Latest Units: % 1  EOS (ABSOLUTE) Latest Ref Range: 0.0-0.4 x10E3/uL 0.1   CMP     Component Value Date/Time   NA 141 07/30/2015 1139   NA 141 07/26/2015 1202  K 4.0 07/30/2015 1139   CL 101 07/30/2015 1139   CO2 22 07/30/2015 1139   GLUCOSE 190* 07/30/2015 1139   GLUCOSE 167* 07/26/2015 1202   BUN 14 07/30/2015 1139   BUN 15 07/26/2015 1202   CREATININE 0.93 07/30/2015 1139   CALCIUM 9.7 07/30/2015 1139   PROT 6.6 07/30/2015 1139   ALBUMIN 4.5 07/30/2015 1139   AST 20 07/30/2015 1139   ALT 12 07/30/2015 1139   ALKPHOS 66 07/30/2015 1139   BILITOT 0.5 07/30/2015 1139   BILITOT 0.4 10/10/2014 0835   GFRNONAA 57* 07/30/2015 1139   GFRAA 65 07/30/2015 1139   Results for ARETHEA, LENZY (MRN [id number]) as of 02/19/2016 10:47  Ref. Range 11/11/2015 11:31 11/27/2015 15:00 12/27/2015 09:56 01/06/2016 08:36 01/27/2016 12:36  Platelets Latest Ref Range: 150-379 x10E3/uL 513 (H) 414 (H) 425 (H) 369 373    ASSESSMENT & PLAN:  JAK 2 positive ET  Platelet count is now WNL. She is to continue hydrea on  Monday and Thursday. We will keep a close watch on her counts moving forward.  I advised her the goal is to keep platelets less than 400 K and not affect her other blood counts.  She did not have labs drawn before our visit. She will have labs after our visit.  She will have her labs checked again in one month at [facility].  Encouraged the patient to follow with her ophthalmologist sooner than her currently scheduled appointment in May to discuss her visual changes.   She will return for follow up in 2 months with labs.  All questions were answered. The patient knows to call the clinic with any problems, questions or concerns..  This document serves as a record of services personally performed by Ancil Linsey, MD. It was created on her behalf by Arlyce Harman, a trained medical scribe. The creation of this record is based on the scribe's personal  observations and the provider's statements to them. This document has been checked and approved by the attending provider.  I have reviewed the above documentation for accuracy and completeness, and I agree with the above.  This note was electronically signed.    Molli Hazard, MD  02/19/2016 11:14 AM

## 2016-02-19 NOTE — Patient Instructions (Addendum)
[place] at [facility] Discharge Instructions  RECOMMENDATIONS MADE BY THE CONSULTANT AND ANY TEST RESULTS WILL BE SENT TO YOUR REFERRING PHYSICIAN.   Exam and discussion by Dr Whitney Muse today Blood work last time was good. Blood work today Hydrea sent to [facility] in 1 month and 2 months, you can do you labs next month in [place] Return to see the doctor in 2 months  Please call the clinic if you have any questions or concerns    Thank you for choosing [place] at [facility] to provide your oncology and hematology care.  To afford each patient quality time with our provider, please arrive at least 15 minutes before your scheduled appointment time.   Beginning January 23rd 2017 lab work for the [facility] will be done in the  Main lab at [facility] on 1st floor. If you have a lab appointment with the [facility] please come in thru the  Main Entrance and check in at the main information desk  You need to re-schedule your appointment should you arrive 10 or more minutes late.  We strive to give you quality time with our providers, and arriving late affects you and other patients whose appointments are after yours.  Also, if you no show three or more times for appointments you may be dismissed from the clinic at the providers discretion.     Again, thank you for choosing [facility].  Our hope is that these requests will decrease the amount of time that you wait before being seen by our physicians.       _____________________________________________________________  Should you have questions after your visit to [facility], please contact our office at [phone] between the hours of 8:30 a.m. and 4:30 p.m.  Voicemails left after 4:30 p.m. will not be returned until the following business day.  For prescription refill requests, have your pharmacy contact our office.         Resources For  Cancer Patients and their Caregivers ? American Cancer Society: Can assist with transportation, wigs, general needs, runs Look Good Feel Better.        647-565-3764 ? Cancer Care: Provides financial assistance, online support groups, medication/co-pay assistance.  1-800-813-HOPE 804-588-6853) ? [place] Assists [place] cancer patients and their families through emotional , educational and financial support.  612-240-8635 ? [place] DSS Where to apply for food stamps, Medicaid and utility assistance. 548-577-2115 ? [facility]: Transportation to medical appointments. 858-870-1871 ? Social Security Administration: May apply for disability if have a Stage IV cancer. 239-662-0781 9736719599 ? [employer], Disability and Transit Services: Assists with nutrition, care and transit needs. (762)250-0754

## 2016-02-27 ENCOUNTER — Other Ambulatory Visit: Payer: Self-pay | Admitting: Pharmacist

## 2016-02-27 MED ORDER — RIVAROXABAN 15 MG PO TABS: ORAL_TABLET | ORAL | Status: DC

## 2016-03-19 ENCOUNTER — Other Ambulatory Visit: Payer: Self-pay | Admitting: *Deleted

## 2016-03-19 ENCOUNTER — Other Ambulatory Visit: Payer: Medicare Other

## 2016-03-19 DIAGNOSIS — R799 Abnormal finding of blood chemistry, unspecified: Secondary | ICD-10-CM

## 2016-03-20 LAB — CBC WITH DIFFERENTIAL/PLATELET
BASOS: 1 %
Basophils Absolute: 0 10*3/uL (ref 0.0–0.2)
EOS (ABSOLUTE): 0.1 10*3/uL (ref 0.0–0.4)
EOS: 1 %
HEMATOCRIT: 40.5 % (ref 34.0–46.6)
HEMOGLOBIN: 13.5 g/dL (ref 11.1–15.9)
IMMATURE GRANS (ABS): 0 10*3/uL (ref 0.0–0.1)
Immature Granulocytes: 0 %
LYMPHS ABS: 2.3 10*3/uL (ref 0.7–3.1)
Lymphs: 30 %
MCH: 30.7 pg (ref 26.6–33.0)
MCHC: 33.3 g/dL (ref 31.5–35.7)
MCV: 92 fL (ref 79–97)
MONOCYTES: 8 %
Monocytes Absolute: 0.6 10*3/uL (ref 0.1–0.9)
NEUTROS ABS: 4.5 10*3/uL (ref 1.4–7.0)
Neutrophils: 60 %
Platelets: 404 10*3/uL — ABNORMAL HIGH (ref 150–379)
RBC: 4.4 x10E6/uL (ref 3.77–5.28)
RDW: 15.3 % (ref 12.3–15.4)
WBC: 7.5 10*3/uL (ref 3.4–10.8)

## 2016-03-26 ENCOUNTER — Telehealth (HOSPITAL_COMMUNITY): Payer: Self-pay | Admitting: *Deleted

## 2016-03-26 NOTE — Telephone Encounter (Signed)
Pt instructed to continue taking Hydrea as ordered (one on Monday and one on Thursday). Pt to return on 5/15 for labs and to see Dr. Whitney Muse. Pt said ok.

## 2016-04-07 ENCOUNTER — Other Ambulatory Visit: Payer: Self-pay | Admitting: Cardiology

## 2016-04-07 NOTE — Telephone Encounter (Signed)
REFILL 

## 2016-04-16 ENCOUNTER — Other Ambulatory Visit: Payer: Self-pay | Admitting: Cardiology

## 2016-04-20 ENCOUNTER — Encounter (HOSPITAL_COMMUNITY): Payer: Self-pay | Admitting: Hematology & Oncology

## 2016-04-20 ENCOUNTER — Encounter (HOSPITAL_COMMUNITY): Payer: Medicare Other

## 2016-04-20 ENCOUNTER — Encounter (HOSPITAL_COMMUNITY): Payer: Medicare Other | Attending: Hematology & Oncology | Admitting: Hematology & Oncology

## 2016-04-20 VITALS — BP 127/58 | HR 63 | Temp 98.0°F | Resp 20 | Wt 165.7 lb

## 2016-04-20 DIAGNOSIS — D473 Essential (hemorrhagic) thrombocythemia: Secondary | ICD-10-CM | POA: Diagnosis not present

## 2016-04-20 DIAGNOSIS — Z1589 Genetic susceptibility to other disease: Secondary | ICD-10-CM

## 2016-04-20 DIAGNOSIS — D75839 Thrombocytosis, unspecified: Secondary | ICD-10-CM

## 2016-04-20 LAB — CBC WITH DIFFERENTIAL/PLATELET
BASOS ABS: 0 10*3/uL (ref 0.0–0.1)
BASOS PCT: 1 %
Eosinophils Absolute: 0.1 10*3/uL (ref 0.0–0.7)
Eosinophils Relative: 1 %
HEMATOCRIT: 36.4 % (ref 36.0–46.0)
HEMOGLOBIN: 12.3 g/dL (ref 12.0–15.0)
Lymphocytes Relative: 27 %
Lymphs Abs: 1.7 10*3/uL (ref 0.7–4.0)
MCH: 31.5 pg (ref 26.0–34.0)
MCHC: 33.8 g/dL (ref 30.0–36.0)
MCV: 93.3 fL (ref 78.0–100.0)
Monocytes Absolute: 0.4 10*3/uL (ref 0.1–1.0)
Monocytes Relative: 6 %
NEUTROS ABS: 4.2 10*3/uL (ref 1.7–7.7)
NEUTROS PCT: 65 %
Platelets: 318 10*3/uL (ref 150–400)
RBC: 3.9 MIL/uL (ref 3.87–5.11)
RDW: 14.3 % (ref 11.5–15.5)
WBC: 6.4 10*3/uL (ref 4.0–10.5)

## 2016-04-20 NOTE — Progress Notes (Signed)
[place] [facility] [place] Note  Patient Care Team: Chipper Herb, MD as PCP - General (Family Medicine) Minus Breeding, MD as Consulting Physician (Cardiology) Lafayette Dragon, MD as Consulting Physician (Gastroenterology) Clent Jacks, MD as Consulting Physician (Ophthalmology) Raeanne Gathers, AUD (Audiology)  CHIEF COMPLAINTS:  JAK 2 positive ET  HISTORY OF PRESENTING ILLNESS:  Kristy [name] 82 y.o. female is here for further follow-up of JAK 2 positive ET. She takes hydrea twice weekly, once on Monday and once on Thursday.  Kristy [name] is accompanied by her husband. I personally reviewed and went over laboratory studies with the patient.   She has been feeling better and is without complaint. Denies nausea, noting she takes the [facility] after eating. Describes her appetite as "too much". She does not have any questions at this time.     MEDICAL HISTORY:  Past Medical History  Diagnosis Date  . Hypertension   . Obesity   . Colon polyps   . Endometrial polyp   . SOB (shortness of breath)   . Diverticulosis   . GERD (gastroesophageal reflux disease)   . Hiatal hernia   . Endometrial hyperplasia without atypia, simple   . Hyperplastic colon polyp   . Lipoma   . Fatty liver   . A-fib [facility])     SURGICAL HISTORY: Past Surgical History  Procedure Laterality Date  . Cholecystectomy  03/2002  . Umbilical hernia repair  09/2002  . Knee surgery    . Optic neuritis    . Dilation and curettage of uterus      x 2  . Cataract extraction    . Breast biopsy    . Hernia repair      SOCIAL HISTORY: Social History   Social History  . Marital Status: Married    Spouse Name: N/A  . Number of Children: N/A  . Years of Education: N/A   Occupational History  . [employer]     Retired   Social History Main Topics  . Smoking status: Former Smoker    Quit date: 12/07/1982  . Smokeless tobacco: Never Used  . Alcohol Use: No  .  Drug Use: No  . Sexual Activity: Not on file   Other Topics Concern  . Not on file   Social History Narrative   Married 65 years. Her husband is not well, he has dementia; short-term memory is gone He has just had cancer as well and was treated with radiation by Dr. Isidore Moos He still knows her; he still enjoys eating and they still go out to eat and attend church. They used to ballroom dance and she says "oh, we've had a wonderful life."  She worked up past 49. 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 Laurance Flatten.  She says she puffed smoke but never inhaled; she "puffed" while her friends were playing [employer]. She has never had problems with alcohol.  She has two living children, one dead. Her first born was microcephalic. He lived to be 59. When she heard the news of the [place] virus this year, it brought back all of her sadness about him. She started taking Xanex to alleviate her stress about this matter. Three grandchildren and no great grandchildren.  FAMILY HISTORY: Family History  Problem Relation Age of Onset  . Diabetes  Mother   . Heart disease Mother   . Diabetes Sister   . Diabetes Brother   . Heart disease Brother   . Colon cancer Neg Hx    indicated that her mother is deceased. She indicated that her father is deceased. She indicated that her sister is deceased.    Her mother lived to be 40; she was diabetic. She was very healthy except for diabetes and high blood pressure. She got up one morning, she died suddenly. Father lived to be 86; he was well most of his life until the last 6 months, had a stroke. She is the baby of 13 children. She is one of two that are still living.  Her sister is 95 and still living. 6 in the family have lived into the 31's.  ALLERGIES:  is allergic to codeine and tramadol.  MEDICATIONS:  Current  Outpatient Prescriptions  Medication Sig Dispense Refill  . ALPRAZolam (XANAX) 0.25 MG tablet Take 1 tablet (0.25 mg total) by mouth daily as needed for anxiety. 30 tablet 2  . calcium-vitamin D 250-100 MG-UNIT tablet Take 1-2 tablets by mouth daily.    . Cholecalciferol (VITAMIN D3) 2000 UNITS TABS Take by mouth daily.    Marland Kitchen estradiol (CLIMARA - DOSED IN MG/24 HR) 0.025 mg/24hr patch Place 1 patch (0.025 mg total) onto the skin once a week. 12 patch 1  . hydrochlorothiazide (HYDRODIURIL) 12.5 MG tablet TAKE 1 TABLET DAILY 30 tablet 8  . hydroxyurea (HYDREA) 500 MG capsule Take 1 capsule (500 mg total) by mouth 2 (two) times a week. May take with food to minimize GI side effects. 30 capsule 1  . metoprolol succinate (TOPROL-XL) 25 MG 24 hr tablet TAKE 1 TABLET DAILY 30 tablet 5  . Multiple Vitamin (MULTIVITAMIN) tablet Take 1 tablet by mouth daily.      Marland Kitchen omeprazole (PRILOSEC) 20 MG capsule TAKE ONE CAPSULE BY MOUTH TWICE DAILY 60 capsule 4  . Polyethyl Glycol-Propyl Glycol (SYSTANE OP) Apply 1-2 drops to eye 4 (four) times daily as needed (dry eyes).    . progesterone (PROMETRIUM) 200 MG capsule 12 tabs every four months 12 capsule 12  . Rivaroxaban (XARELTO) 15 MG TABS tablet TAKE 1 TABLET (15 MG TOTAL) BY MOUTH DAILY. 30 tablet 2  . sertraline (ZOLOFT) 50 MG tablet Take 1 tablet (50 mg total) by mouth daily. 90 tablet 0  . ONE TOUCH ULTRA TEST test strip USE TO TEST SUGAR TWICE A DAY AS INSTRUCTED (Patient not taking: Reported on 04/20/2016) 100 each 2   No current facility-administered medications for this visit.    Review of Systems  Constitutional: Negative for fever, chills and weight loss.  HENT: Negative.  Negative for congestion, hearing loss, nosebleeds, sore throat and tinnitus.   Eyes: Negative.  Negative for blurred vision, double vision, pain and discharge.  Respiratory: Negative.  Negative for cough, hemoptysis, sputum production, shortness of breath and wheezing.     Cardiovascular: Negative.  Negative for chest pain, palpitations, claudication, leg swelling and PND.  Gastrointestinal: Negative for heartburn, nausea, vomiting, abdominal pain, constipation, blood in stool and melena.  Genitourinary: Negative.  Negative for dysuria, urgency, frequency and hematuria.  Musculoskeletal: Negative.  Negative for myalgias, joint pain and falls.  Skin: Negative.  Negative for itching and rash.  Neurological: Negative.  Negative for dizziness, tingling, tremors, sensory change, speech change, focal weakness, seizures, loss of consciousness, weakness and headaches.  Endo/Heme/Allergies: Negative.  Does not bruise/bleed easily.  Psychiatric/Behavioral: Negative.  Negative  for depression, suicidal ideas, memory loss and substance abuse. The patient is not nervous/anxious and does not have insomnia.   All other systems reviewed and are negative.  14 point ROS was done and is otherwise as detailed above or in HPI   PHYSICAL EXAMINATION: ECOG PERFORMANCE STATUS: 1 - Symptomatic but completely ambulatory  Filed Vitals:   04/20/16 1329  BP: 127/58  Pulse: 63  Temp: 98 F (36.7 C)  Resp: 20   Filed Weights   04/20/16 1329  Weight: 165 lb 11.2 oz (75.161 kg)    Physical Exam  Constitutional: She is oriented to person, place, and time and well-developed, well-nourished, and in no distress.  Appears somewhat younger than stated age  HENT:  Head: Normocephalic and atraumatic.  Nose: Nose normal.  Mouth/Throat: Oropharynx is clear and moist. No oropharyngeal exudate.  Eyes: Conjunctivae and EOM are normal. Pupils are equal, round, and reactive to light. Right eye exhibits no discharge. Left eye exhibits no discharge. No scleral icterus.  Neck: Normal range of motion. Neck supple. No tracheal deviation present. No thyromegaly present.  Cardiovascular: Normal rate, regular rhythm and normal heart sounds.  Exam reveals no gallop and no friction rub.   No murmur  heard. Pulmonary/Chest: Effort normal and breath sounds normal. She has no wheezes. She has no rales.  Abdominal: Soft. Bowel sounds are normal. She exhibits no distension and no mass. There is no tenderness. There is no rebound and no guarding.  Musculoskeletal: Normal range of motion. She exhibits no edema.  Lymphadenopathy:    She has no cervical adenopathy.  Neurological: She is alert and oriented to person, place, and time. She has normal reflexes. No cranial nerve deficit. Gait normal. Coordination normal.  Skin: Skin is warm and dry. No rash noted.  Psychiatric: Mood, memory, affect and judgment normal.  Nursing note and vitals reviewed.   LABORATORY DATA:  I have reviewed the data as listed. Results for LETISHIA, GALINATO (MRN [id number]) as of 04/20/2016 13:53  Ref. Range 04/20/2016 13:12  WBC Latest Ref Range: 4.0-10.5 K/uL 6.4  RBC Latest Ref Range: 3.87-5.11 MIL/uL 3.90  Hemoglobin Latest Ref Range: 12.0-15.0 g/dL 12.3  HCT Latest Ref Range: 36.0-46.0 % 36.4  MCV Latest Ref Range: 78.0-100.0 fL 93.3  MCH Latest Ref Range: 26.0-34.0 pg 31.5  MCHC Latest Ref Range: 30.0-36.0 g/dL 33.8  RDW Latest Ref Range: 11.5-15.5 % 14.3  Platelets Latest Ref Range: 150-400 K/uL 318  Neutrophils Latest Units: % 65  Lymphocytes Latest Units: % 27  Monocytes Relative Latest Units: % 6  Eosinophil Latest Units: % 1  Basophil Latest Units: % 1  NEUT# Latest Ref Range: 1.7-7.7 K/uL 4.2  Lymphocyte # Latest Ref Range: 0.7-4.0 K/uL 1.7  Monocyte # Latest Ref Range: 0.1-1.0 K/uL 0.4  Eosinophils Absolute Latest Ref Range: 0.0-0.7 K/uL 0.1  Basophils Absolute Latest Ref Range: 0.0-0.1 K/uL 0.0   Results for CLEOTA, DONAWAY (MRN [id number]) as of 04/20/2016 13:53  Ref. Range 02/04/2016 16:48 02/04/2016 16:49 02/19/2016 11:34 03/19/2016 16:31 04/20/2016 13:12  Platelets Latest Ref Range: 150-400 K/uL   [phone] (H) 318    ASSESSMENT & PLAN:  JAK 2 positive ET   I personally reviewed and went over  laboratory studies with the patient. Her platelets continue to stay stable with current Hydrea. I have encouraged the patient to continue with current dosing every Monday and Thursday. She will contact us if she notices anything different in the coming months.  She does not need  any refills at this time.   The patient is agreeable to extending her follow up visits to once every 3 months with labs. She will continue to follow with her PCP, as well. She will have a CBC at [facility] in 6 weeks.   All questions were answered. The patient knows to call the clinic with any problems, questions or concerns..  This document serves as a record of services personally performed by Ancil Linsey, MD. It was created on her behalf by Arlyce Harman, a trained medical scribe. The creation of this record is based on the scribe's personal observations and the provider's statements to them. This document has been checked and approved by the attending provider.  I have reviewed the above documentation for accuracy and completeness, and I agree with the above.  This note was electronically signed.    Molli Hazard, MD   04/20/2016 1:56 PM

## 2016-04-20 NOTE — Patient Instructions (Addendum)
[place] at [facility]  Discharge Instructions:  CBC with Diff in 6 weeks at [facility] Return in 3 months to follow up with Dr. Whitney Muse and for Labs; Labs to be done same day Platelets today are [phone]   _______________________________________________________________  Thank you for choosing [place] at [facility] to provide your oncology and hematology care.  To afford each patient quality time with our providers, please arrive at least 15 minutes before your scheduled appointment.  You need to re-schedule your appointment if you arrive 10 or more minutes late.  We strive to give you quality time with our providers, and arriving late affects you and other patients whose appointments are after yours.  Also, if you no show three or more times for appointments you may be dismissed from the clinic.  Again, thank you for choosing [place] at [place] hope is that these requests will allow you access to exceptional care and in a timely manner. _______________________________________________________________  If you have questions after your visit, please contact our office at [phone] between the hours of 8:30 a.m. and 5:00 p.m. Voicemails left after 4:30 p.m. will not be returned until the following business day. _______________________________________________________________  For prescription refill requests, have your pharmacy contact our office. _______________________________________________________________  Recommendations made by the consultant and any test results will be sent to your referring physician. _______________________________________________________________

## 2016-04-24 ENCOUNTER — Ambulatory Visit (INDEPENDENT_AMBULATORY_CARE_PROVIDER_SITE_OTHER): Payer: Medicare Other

## 2016-04-24 ENCOUNTER — Ambulatory Visit (INDEPENDENT_AMBULATORY_CARE_PROVIDER_SITE_OTHER): Payer: Medicare Other | Admitting: Family Medicine

## 2016-04-24 ENCOUNTER — Encounter: Payer: Self-pay | Admitting: *Deleted

## 2016-04-24 ENCOUNTER — Encounter: Payer: Self-pay | Admitting: Family Medicine

## 2016-04-24 VITALS — BP 111/64 | HR 57 | Temp 97.0°F | Ht 62.0 in | Wt 165.0 lb

## 2016-04-24 DIAGNOSIS — K219 Gastro-esophageal reflux disease without esophagitis: Secondary | ICD-10-CM | POA: Diagnosis not present

## 2016-04-24 DIAGNOSIS — I10 Essential (primary) hypertension: Secondary | ICD-10-CM

## 2016-04-24 DIAGNOSIS — I480 Paroxysmal atrial fibrillation: Secondary | ICD-10-CM | POA: Diagnosis not present

## 2016-04-24 DIAGNOSIS — E559 Vitamin D deficiency, unspecified: Secondary | ICD-10-CM

## 2016-04-24 DIAGNOSIS — M25562 Pain in left knee: Secondary | ICD-10-CM | POA: Diagnosis not present

## 2016-04-24 DIAGNOSIS — D473 Essential (hemorrhagic) thrombocythemia: Secondary | ICD-10-CM | POA: Diagnosis not present

## 2016-04-24 DIAGNOSIS — M25561 Pain in right knee: Secondary | ICD-10-CM | POA: Diagnosis not present

## 2016-04-24 DIAGNOSIS — Z1589 Genetic susceptibility to other disease: Secondary | ICD-10-CM | POA: Diagnosis not present

## 2016-04-24 DIAGNOSIS — Z1211 Encounter for screening for malignant neoplasm of colon: Secondary | ICD-10-CM | POA: Diagnosis not present

## 2016-04-24 MED ORDER — ESTRADIOL 0.025 MG/24HR TD PTWK: 0.0250 mg | MEDICATED_PATCH | TRANSDERMAL | Status: DC

## 2016-04-24 MED ORDER — PROGESTERONE MICRONIZED 200 MG PO CAPS: ORAL_CAPSULE | ORAL | Status: DC

## 2016-04-24 NOTE — Patient Instructions (Addendum)
Medicare Annual Wellness Visit  [place] and the medical providers at [facility] strive to bring you the best medical care.  In doing so we not only want to address your current medical conditions and concerns but also to detect new conditions early and prevent illness, disease and health-related problems.    Medicare offers a yearly Wellness Visit which allows our clinical staff to assess your need for preventative services including immunizations, lifestyle education, counseling to decrease risk of preventable diseases and screening for fall risk and other medical concerns.    This visit is provided free of charge (no copay) for all Medicare recipients. The clinical pharmacists at [facility] have begun to conduct these Wellness Visits which will also include a thorough review of all your medications.    As you primary medical provider recommend that you make an appointment for your Annual Wellness Visit if you have not done so already this year.  You may set up this appointment before you leave today or you may call back [id number]) and schedule an appointment.  Please make sure when you call that you mention that you are scheduling your Annual Wellness Visit with the clinical pharmacist so that the appointment may be made for the proper length of time.     Continue current medications. Continue good therapeutic lifestyle changes which include good diet and exercise. Fall precautions discussed with patient. If an FOBT was given today- please return it to our front desk. If you are over 39 years old - you may need Prevnar 63 or the adult Pneumonia vaccine.  **Flu shots are available--- please call and schedule a FLU-CLINIC appointment**  After your visit with [place] today you will receive a survey in the mail or online from [employer] regarding your care with [place]. Please take a moment to fill this out. Your feedback is very  important to [place] as you can help [place] better understand your patient needs as well as improve your experience and satisfaction. WE CARE ABOUT YOU!!!   We will call you about the [place] and the xray reports We will arrange an appt with Dr Elmyra Ricks  May sure you see Dr Evette Doffing for your physical

## 2016-04-24 NOTE — Progress Notes (Signed)
Subjective:    Patient ID: Kristy [name], female    DOB: 07/28/1928, 82 y.o.   MRN: [id number]  HPI Pt here for follow up and management of chronic medical problems which includes a fib and hypertension. She is taking medications. The patient's main complaint today is her right knee the and below with discomfort and pain. She is currently wearing something like a elastic strap around the top of her calf and says this relieves her discomfort. She does not wear this all the time she only wears it as needed and she says she's been doing this for years. There is no swelling and edema in the leg. She describes the pain in her knee is being worse in the bed at night and sometimes during the day. She says she has to sit up on the side of bed sort of move her leg back and forth and it relieves the discomfort. She denies any chest pain or shortness of breath anymore than usual she has a history of a large hiatal hernia is currently not having any major issues or problems with this. She is not seeing any blood in the stool or had any black tarry bowel movements. She is passing her water without problems. She is alert and a good historian. She is on hormone therapy. She has not had a pelvic exam in a good while.      Patient Active Problem List   Diagnosis Date Noted  . 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 Essential thrombocytosis ([place]) 01/24/2016  . PAF (paroxysmal atrial fibrillation) ([place]) 12/18/2013  . Cervical spondylosis without myelopathy 04/27/2013  . ABDOMINAL PAIN, UNSPECIFIED SITE 02/02/2011  . Palpitations 02/26/2010  . COLONIC POLYPS, HYPERPLASTIC 12/07/2007  . LIPOMA 12/07/2007  . OBESITY 12/07/2007  . Essential hypertension 12/07/2007  . GERD 12/07/2007  . HIATAL HERNIA 12/07/2007  . DIVERTICULOSIS, COLON 12/07/2007  . FATTY LIVER DISEASE 12/07/2007  . ENDOMETRIAL POLYP 12/07/2007  . COMPLEX ENDOMETRIAL HYPERPLASIA WITHOUT ATYPIA 12/07/2007  . SOB 12/07/2007   Outpatient Encounter  Prescriptions as of 04/24/2016  Medication Sig  . ALPRAZolam (XANAX) 0.25 MG tablet Take 1 tablet (0.25 mg total) by mouth daily as needed for anxiety.  . calcium carbonate (OSCAL) 1500 (600 Ca) MG TABS tablet Take 600 mg of elemental calcium by mouth 2 (two) times daily with a meal.  . Cholecalciferol (VITAMIN D3) 2000 UNITS TABS Take by mouth daily.  Marland Kitchen estradiol (CLIMARA - DOSED IN MG/24 HR) 0.025 mg/24hr patch Place 1 patch (0.025 mg total) onto the skin once a week.  . hydrochlorothiazide (HYDRODIURIL) 12.5 MG tablet TAKE 1 TABLET DAILY  . hydroxyurea (HYDREA) 500 MG capsule Take 1 capsule (500 mg total) by mouth 2 (two) times a week. May take with food to minimize GI side effects.  . metoprolol succinate (TOPROL-XL) 25 MG 24 hr tablet TAKE 1 TABLET DAILY  . Multiple Vitamin (MULTIVITAMIN) tablet Take 1 tablet by mouth daily.    Marland Kitchen omeprazole (PRILOSEC) 20 MG capsule TAKE ONE CAPSULE BY MOUTH TWICE DAILY  . ONE TOUCH ULTRA TEST test strip USE TO TEST SUGAR TWICE A DAY AS INSTRUCTED  . Polyethyl Glycol-Propyl Glycol (SYSTANE OP) Apply 1-2 drops to eye 4 (four) times daily as needed (dry eyes).  . progesterone (PROMETRIUM) 200 MG capsule 12 tabs every four months  . Rivaroxaban (XARELTO) 15 MG TABS tablet TAKE 1 TABLET (15 MG TOTAL) BY MOUTH DAILY.  Marland Kitchen sertraline (ZOLOFT) 50 MG tablet Take 1 tablet (50 mg total)  by mouth daily.  . [DISCONTINUED] calcium-vitamin D 250-100 MG-UNIT tablet Take 1-2 tablets by mouth daily.   No facility-administered encounter medications on file as of 04/24/2016.     Review of Systems  Constitutional: Negative.   HENT: Negative.   Eyes: Negative.   Respiratory: Negative.   Cardiovascular: Negative.   Gastrointestinal: Negative.   Endocrine: Negative.   Genitourinary: Negative.   Musculoskeletal: Positive for arthralgias (bilateral leg pain).  Skin: Negative.   Allergic/Immunologic: Negative.   Neurological: Negative.   Hematological: Negative.     Psychiatric/Behavioral: Negative.        Objective:   Physical Exam  Constitutional: She is oriented to person, place, and time. She appears well-developed and well-nourished.  Alert and cooperative  HENT:  Head: Normocephalic and atraumatic.  Right Ear: External ear normal.  Left Ear: External ear normal.  Nose: Nose normal.  Mouth/Throat: Oropharynx is clear and moist.  Eyes: Conjunctivae and EOM are normal. Pupils are equal, round, and reactive to light. Right eye exhibits no discharge. Left eye exhibits no discharge. No scleral icterus.  Neck: Normal range of motion. Neck supple. No thyromegaly present.  Cardiovascular: Normal rate, regular rhythm, normal heart sounds and intact distal pulses.   No murmur heard. At 60/m  Pulmonary/Chest: Effort normal and breath sounds normal. No respiratory distress. She has no wheezes. She has no rales. She exhibits no tenderness.  Clear anteriorly and posteriorly  Abdominal: Soft. Bowel sounds are normal. She exhibits no mass. There is no tenderness. There is no rebound and no guarding.  No liver or spleen enlargement and no masses.  Musculoskeletal: Normal range of motion. She exhibits tenderness. She exhibits no edema.  There is tenderness at bilateral joint lines of each knee with the right being worse than the left.  Lymphadenopathy:    She has no cervical adenopathy.  Neurological: She is alert and oriented to person, place, and time. She has normal reflexes. No cranial nerve deficit.  Skin: Skin is warm and dry. No rash noted.  Psychiatric: She has a normal mood and affect. Her behavior is normal. Judgment and thought content normal.  Nursing note and vitals reviewed.  BP 111/64 mmHg  Pulse 57  Temp(Src) 97 F (36.1 C) (Oral)  Ht 5\' 2"  (1.575 m)  Wt 165 lb (74.844 kg)  BMI 30.17 kg/m2        Assessment & Plan:  1. Vitamin D deficiency -Continue current treatment  2. Paroxysmal atrial fibrillation (HCC) -The heart had a  regular rate and rhythm today.  3. Essential hypertension -The blood pressure is good today. She will continue with current treatment.  4. Gastroesophageal reflux disease, esophagitis presence not specified -There is no epigastric tenderness today. She has no complaints with her reflux today.  5. Special screening for malignant neoplasms, colon - Fecal occult blood, imunochemical; Future  6. Essential thrombocytosis ([place]) -She is been followed by the hematologist and her most recent platelet count was lower and improved.  7. PAF (paroxysmal atrial fibrillation) (HCC) -Continue to follow-up with cardiology  8. JAK2 V617F mutation -Continue to follow-up with hematology  9. Bilateral knee pain -Make arrangements for her to see the orthopedic surgeon regarding her knee pain and question the use of the strap and she is wearing around her calf. - DG Knee 1-2 Views Left; Future - DG Knee 1-2 Views Right; Future - Ambulatory referral to Orthopedic Surgery  Meds ordered this encounter  Medications  . calcium carbonate (OSCAL) 1500 (600 Ca) MG  TABS tablet    Sig: Take 600 mg of elemental calcium by mouth 2 (two) times daily with a meal.  . progesterone (PROMETRIUM) 200 MG capsule    Sig: 12 tabs every four months    Dispense:  12 capsule    Refill:  12  . estradiol (CLIMARA - DOSED IN MG/24 HR) 0.025 mg/24hr patch    Sig: Place 1 patch (0.025 mg total) onto the skin once a week.    Dispense:  12 patch    Refill:  3   Patient Instructions                       Medicare Annual Wellness Visit  [facility] and the medical providers at [facility] strive to bring you the best medical care.  In doing so we not only want to address your current medical conditions and concerns but also to detect new conditions early and prevent illness, disease and health-related problems.    Medicare offers a yearly Wellness Visit which allows our clinical staff to assess your need  for preventative services including immunizations, lifestyle education, counseling to decrease risk of preventable diseases and screening for fall risk and other medical concerns.    This visit is provided free of charge (no copay) for all Medicare recipients. The clinical pharmacists at [facility] have begun to conduct these Wellness Visits which will also include a thorough review of all your medications.    As you primary medical provider recommend that you make an appointment for your Annual Wellness Visit if you have not done so already this year.  You may set up this appointment before you leave today or you may call back [phone]) and schedule an appointment.  Please make sure when you call that you mention that you are scheduling your Annual Wellness Visit with the clinical pharmacist so that the appointment may be made for the proper length of time.     Continue current medications. Continue good therapeutic lifestyle changes which include good diet and exercise. Fall precautions discussed with patient. If an FOBT was given today- please return it to our front desk. If you are over 21 years old - you may need Prevnar 5 or the adult Pneumonia vaccine.  **Flu shots are available--- please call and schedule a FLU-CLINIC appointment**  After your visit with [place] today you will receive a survey in the mail or online from [employer] regarding your care with [place]. Please take a moment to fill this out. Your feedback is very important to [place] as you can help [place] better understand your patient needs as well as improve your experience and satisfaction. WE CARE ABOUT YOU!!!   We will call you about the Lyrica and the xray reports We will arrange an appt with Dr Elmyra Ricks  May sure you see Dr Evette Doffing for your physical    Arrie Senate MD

## 2016-04-24 NOTE — Progress Notes (Unsigned)
Patient ID: Kristy [name], female   DOB: [birth year], 82 y.o.   MRN: [id number] No drug-drug interactions identified with current medications and Lyrica.   There is recommendation to use Lyrica with caution in patients over 65 due to increased risk of falls and renal function changes.  Her estimated CrCl is 92mL/ min Lyrica dosing range for this level of renal function is 75 to 300mg /day in 2 to 3 divided doses.

## 2016-04-24 NOTE — Progress Notes (Signed)
I think I'm going to with hold trying Lyrica or for this patient because of her age and the increased chance of side effects. Please encourage her to take one extra strength Tylenol at bedtime.

## 2016-04-24 NOTE — Progress Notes (Unsigned)
Kristy [name], Dr Laurance Flatten would like you to review pt's medications and see if Lyrica would / could be an option for her. She is having some leg and joint pain bilaterally.  Thank you

## 2016-04-28 NOTE — Progress Notes (Unsigned)
Pt aware.

## 2016-04-30 DIAGNOSIS — H47013 Ischemic optic neuropathy, bilateral: Secondary | ICD-10-CM | POA: Diagnosis not present

## 2016-04-30 DIAGNOSIS — Z961 Presence of intraocular lens: Secondary | ICD-10-CM | POA: Diagnosis not present

## 2016-04-30 DIAGNOSIS — H353111 Nonexudative age-related macular degeneration, right eye, early dry stage: Secondary | ICD-10-CM | POA: Diagnosis not present

## 2016-04-30 DIAGNOSIS — H26492 Other secondary cataract, left eye: Secondary | ICD-10-CM | POA: Diagnosis not present

## 2016-04-30 DIAGNOSIS — H401233 Low-tension glaucoma, bilateral, severe stage: Secondary | ICD-10-CM | POA: Diagnosis not present

## 2016-05-18 ENCOUNTER — Other Ambulatory Visit: Payer: Medicare Other

## 2016-05-18 DIAGNOSIS — Z1211 Encounter for screening for malignant neoplasm of colon: Secondary | ICD-10-CM

## 2016-05-20 LAB — FECAL OCCULT BLOOD, IMMUNOCHEMICAL: FECAL OCCULT BLD: NEGATIVE

## 2016-06-08 ENCOUNTER — Other Ambulatory Visit: Payer: Self-pay | Admitting: Family Medicine

## 2016-06-12 ENCOUNTER — Other Ambulatory Visit (INDEPENDENT_AMBULATORY_CARE_PROVIDER_SITE_OTHER): Payer: Medicare Other

## 2016-06-12 ENCOUNTER — Other Ambulatory Visit: Payer: Self-pay | Admitting: Pharmacist

## 2016-06-12 ENCOUNTER — Other Ambulatory Visit: Payer: Medicare Other | Admitting: Pediatrics

## 2016-06-12 DIAGNOSIS — D473 Essential (hemorrhagic) thrombocythemia: Secondary | ICD-10-CM | POA: Diagnosis not present

## 2016-06-13 LAB — CBC WITH DIFFERENTIAL/PLATELET
BASOS: 1 %
Basophils Absolute: 0 10*3/uL (ref 0.0–0.2)
EOS (ABSOLUTE): 0.1 10*3/uL (ref 0.0–0.4)
Eos: 1 %
Hematocrit: 37.9 % (ref 34.0–46.6)
Hemoglobin: 12.5 g/dL (ref 11.1–15.9)
Immature Grans (Abs): 0 10*3/uL (ref 0.0–0.1)
Immature Granulocytes: 0 %
Lymphocytes Absolute: 1.7 10*3/uL (ref 0.7–3.1)
Lymphs: 27 %
MCH: 30.6 pg (ref 26.6–33.0)
MCHC: 33 g/dL (ref 31.5–35.7)
MCV: 93 fL (ref 79–97)
MONOS ABS: 0.5 10*3/uL (ref 0.1–0.9)
Monocytes: 7 %
NEUTROS ABS: 3.9 10*3/uL (ref 1.4–7.0)
NEUTROS PCT: 64 %
PLATELETS: 334 10*3/uL (ref 150–379)
RBC: 4.08 x10E6/uL (ref 3.77–5.28)
RDW: 14.7 % (ref 12.3–15.4)
WBC: 6.1 10*3/uL (ref 3.4–10.8)

## 2016-06-29 ENCOUNTER — Other Ambulatory Visit: Payer: Self-pay

## 2016-06-29 MED ORDER — METOPROLOL SUCCINATE ER 25 MG PO TB24: 25.0000 mg | ORAL_TABLET | Freq: Every day | ORAL | 0 refills | Status: DC

## 2016-06-29 MED ORDER — OMEPRAZOLE 20 MG PO CPDR: 20.0000 mg | DELAYED_RELEASE_CAPSULE | Freq: Every day | ORAL | 0 refills | Status: DC

## 2016-07-07 ENCOUNTER — Encounter: Payer: Self-pay | Admitting: Pharmacist

## 2016-07-07 ENCOUNTER — Ambulatory Visit (INDEPENDENT_AMBULATORY_CARE_PROVIDER_SITE_OTHER): Payer: Medicare Other | Admitting: Pharmacist

## 2016-07-07 VITALS — BP 136/70 | HR 66 | Ht 62.25 in | Wt 160.0 lb

## 2016-07-07 DIAGNOSIS — Z0000 Encounter for general adult medical examination without abnormal findings: Secondary | ICD-10-CM | POA: Diagnosis not present

## 2016-07-07 DIAGNOSIS — E8881 Metabolic syndrome: Secondary | ICD-10-CM | POA: Insufficient documentation

## 2016-07-07 NOTE — Patient Instructions (Addendum)
Kristy [name] , Thank you for taking time to come for your Medicare Wellness Visit. I appreciate your ongoing commitment to your health goals. Please review the following plan we discussed and let me know if I can assist you in the future.   These are the goals we discussed: Physical activity goal - 20 minutes 4 times per week.  Try using pillow between knees at night to see if pain improved.   Increase non-starchy vegetables - carrots, green bean, squash, zucchini, tomatoes, onions, peppers, spinach and other green leafy vegetables, cabbage, lettuce, cucumbers, asparagus, okra (not fried), eggplant Limit sugar and processed foods (cakes, cookies, ice cream, crackers and chips) Increase fresh fruit but limit serving sizes 1/2 cup or about the size of tennis or baseball Limit red meat to no more than 1-2 times per week (serving size about the size of your palm) Choose whole grains / lean proteins - whole wheat bread, quinoa, whole grain rice (1/2 cup), fish, chicken, [place] Avoid sugar and calorie containing beverages - soda, sweet tea and juice.  Choose water or unsweetened tea instead.   This is a list of the screening recommended for you and due dates:  Health Maintenance  Topic Date Due  . Flu Shot  07/07/2016  . Tetanus Vaccine  03/06/2020  . DEXA scan (bone density measurement)  Completed  . Shingles Vaccine  Completed  . Pneumonia vaccines  Completed   Fall Prevention in the Home  Falls can cause injuries and can affect people from all age groups. There are many simple things that you can do to make your home safe and to help prevent falls. WHAT CAN I DO ON THE OUTSIDE OF MY HOME?  Regularly repair the edges of walkways and driveways and fix any cracks.  Remove high doorway thresholds.  Trim any shrubbery on the main path into your home.  Use bright outdoor lighting.  Clear walkways of debris and clutter, including tools and rocks.  Regularly check that handrails are  securely fastened and in good repair. Both sides of any steps should have handrails.  Install guardrails along the edges of any raised decks or porches.  Have leaves, snow, and ice cleared regularly.  Use sand or salt on walkways during winter months.  In the garage, clean up any spills right away, including grease or oil spills. WHAT CAN I DO IN THE BATHROOM?  Use night lights.  Install grab bars by the toilet and in the tub and shower. Do not use towel bars as grab bars.  Use non-skid mats or decals on the floor of the tub or shower.  If you need to sit down while you are in the shower, use a plastic, non-slip stool.Marland Kitchen  Keep the floor dry. Immediately clean up any water that spills on the floor.  Remove soap buildup in the tub or shower on a regular basis.  Attach bath mats securely with double-sided non-slip rug tape.  Remove throw rugs and other tripping hazards from the floor. WHAT CAN I DO IN THE BEDROOM?  Use night lights.  Make sure that a bedside light is easy to reach.  Do not use oversized bedding that drapes onto the floor.  Have a firm chair that has side arms to use for getting dressed.  Remove throw rugs and other tripping hazards from the floor. WHAT CAN I DO IN THE KITCHEN?   Clean up any spills right away.  Avoid walking on wet floors.  Place frequently used items  in easy-to-reach places.  If you need to reach for something above you, use a sturdy step stool that has a grab bar.  Keep electrical cables out of the way.  Do not use floor polish or wax that makes floors slippery. If you have to use wax, make sure that it is non-skid floor wax.  Remove throw rugs and other tripping hazards from the floor. WHAT CAN I DO IN THE STAIRWAYS?  Do not leave any items on the stairs.  Make sure that there are handrails on both sides of the stairs. Fix handrails that are broken or loose. Make sure that handrails are as long as the stairways.  Check any  carpeting to make sure that it is firmly attached to the stairs. Fix any carpet that is loose or worn.  Avoid having throw rugs at the top or bottom of stairways, or secure the rugs with carpet tape to prevent them from moving.  Make sure that you have a light switch at the top of the stairs and the bottom of the stairs. If you do not have them, have them installed. WHAT ARE SOME OTHER FALL PREVENTION TIPS?  Wear closed-toe shoes that fit well and support your feet. Wear shoes that have rubber soles or low heels.  When you use a stepladder, make sure that it is completely opened and that the sides are firmly locked. Have someone hold the ladder while you are using it. Do not climb a closed stepladder.  Add color or contrast paint or tape to grab bars and handrails in your home. Place contrasting color strips on the first and last steps.  Use mobility aids as needed, such as canes, walkers, scooters, and crutches.  Turn on lights if it is dark. Replace any light bulbs that burn out.  Set up furniture so that there are clear paths. Keep the furniture in the same spot.  Fix any uneven floor surfaces.  Choose a carpet design that does not hide the edge of steps of a stairway.  Be aware of any and all pets.  Review your medicines with your healthcare provider. Some medicines can cause dizziness or changes in blood pressure, which increase your risk of falling. Talk with your health care provider about other ways that you can decrease your risk of falls. This may include working with a physical therapist or trainer to improve your strength, balance, and endurance.   This information is not intended to replace advice given to you by your health care provider. Make sure you discuss any questions you have with your health care provider.   Document Released: 11/13/2002 Document Revised: 04/09/2015 Document Reviewed: 12/28/2014 Elsevier Interactive Patient Education [employer].

## 2016-07-07 NOTE — Progress Notes (Signed)
Patient ID: Kristy [name], female   DOB: 11-16-1928, 82 y.o.   MRN: [id number]    Subjective:   Kristy [name] is a 82 y.o. female who presents for a subsequent Medicare Annual Wellness Visit.  Kristy [name] is retired.  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  She is primary caregiver for her husband Kristy [name].  He has recently fractured vertebrae and she is  In charge  of household. She has a housekeeper once per week.  She assists her husband who has some mild cognitive impairment since having a stroke several years ago.     Review of Systems  Review of Systems  Constitutional: Negative.   HENT: Negative.   Eyes: Negative.   Respiratory: Negative.   Cardiovascular: Negative.   Gastrointestinal: Positive for heartburn (controlled with omeprazole).  Genitourinary: Negative.   Musculoskeletal: Positive for falls and joint pain (knee pain; also c/o pain that occurs around 3am each night - from hip to knee.  She moves leg about for 1-2 minutes and pain resolves).  Skin: Negative.   Neurological: Negative.   Endo/Heme/Allergies: Negative.   Psychiatric/Behavioral: Positive for depression.     Current Medications (verified) Outpatient Encounter Prescriptions as of 07/07/2016  Medication Sig  . ALPRAZolam (XANAX) 0.25 MG tablet Take 1 tablet (0.25 mg total) by mouth daily as needed for anxiety.  . Cholecalciferol (VITAMIN D3) 2000 UNITS TABS Take by mouth daily.  Marland Kitchen estradiol (CLIMARA - DOSED IN MG/24 HR) 0.025 mg/24hr patch Place 1 patch (0.025 mg total) onto the skin once a week.  . hydrochlorothiazide (HYDRODIURIL) 12.5 MG tablet TAKE 1 TABLET DAILY  . hydroxyurea (HYDREA) 500 MG capsule Take 1 capsule (500 mg total) by mouth 2 (two) times a week. May take with food to minimize GI side effects.  . metoprolol succinate (TOPROL-XL) 25 MG 24 hr tablet Take 1 tablet (25 mg total) by mouth daily.  . Multiple Vitamin (MULTIVITAMIN) tablet Take 1 tablet by mouth daily.    Marland Kitchen omeprazole (PRILOSEC) 20 MG capsule  Take 1 capsule (20 mg total) by mouth daily.  . ONE TOUCH ULTRA TEST test strip USE TO TEST SUGAR TWICE A DAY AS INSTRUCTED  . Polyethyl Glycol-Propyl Glycol (SYSTANE OP) Apply 1-2 drops to eye 4 (four) times daily as needed (dry eyes).  . progesterone (PROMETRIUM) 200 MG capsule 12 tabs every four months  . sertraline (ZOLOFT) 50 MG tablet TAKE 1 TABLET DAILY  . XARELTO 15 MG TABS tablet TAKE 1 TABLET (15 MG TOTAL) BY MOUTH DAILY.  . calcium carbonate (OSCAL) 1500 (600 Ca) MG TABS tablet Take 600 mg of elemental calcium by mouth daily.  Marland Kitchen latanoprost (XALATAN) 0.005 % ophthalmic solution Place 1 drop into both eyes daily.   No facility-administered encounter medications on file as of 07/07/2016.     Allergies (verified) Codeine and Tramadol   History: Past Medical History:  Diagnosis Date  . A-fib ([place])   . Cataract   . Closed left arm fracture 2016  . Colon polyps   . Diverticulosis   . Endometrial hyperplasia without atypia, simple   . Endometrial polyp   . Fatty liver   . GERD (gastroesophageal reflux disease)   . Hiatal hernia   . Hyperplastic colon polyp   . Hypertension   . Lipoma   . Obesity   . Optic neuropathy of both eyes 1989, 1997  . SOB (shortness of breath)   . Thrombocytosis ([place])    Past Surgical History:  Procedure Laterality  Date  . breast biopsy    . CATARACT EXTRACTION    . CHOLECYSTECTOMY  03/2002  . DILATION AND CURETTAGE OF UTERUS     x 2  . EYE SURGERY    . HERNIA REPAIR    . KNEE SURGERY    . optic neuritis    . umbilical hernia repair  09/2002   Family History  Problem Relation Age of Onset  . Diabetes Mother   . Diabetes Sister   . Stroke Father   . Diabetes Brother   . Heart disease Brother   . Cancer Sister     lung  . Cancer Sister     vulva  . Cancer Sister   . Dementia Sister   . Osteoporosis Sister   . Diabetes Sister   . Osteoporosis Sister   . Diabetes Sister   . Cancer Brother     bladder  . Colon cancer Neg Hx     Social History   Occupational History  . [employer] Retired    Retired   Social History Main Topics  . Smoking status: Former Smoker    Quit date: 12/07/1982  . Smokeless tobacco: Never Used  . Alcohol use No  . Drug use: No  . Sexual activity: No    Do you feel safe at home?  Yes Are there smokers in your home (other than you)? No  Dietary issues and exercise activities: Current Exercise Habits: The patient does not participate in regular exercise at present, Exercise limited by: orthopedic condition(s)  She doesn't exercise formally but does housework, cooking and some gardening.    Current Dietary habits:  Lots of fruits and vegetables  Objective:    Today's Vitals   07/07/16 1509  BP: 136/70  Pulse: 66  Weight: 160 lb (72.6 kg)  Height: 5' 2.25" (1.581 m)  PainSc: 2    Body mass index is 29.03 kg/m.  Activities of Daily Living In your present state of health, do you have any difficulty performing the following activities: 07/07/2016  Hearing? N  Vision? Y  Difficulty concentrating or making decisions? N  Walking or climbing stairs? N  Dressing or bathing? N  Doing errands, shopping? N  Preparing Food and eating ? N  Using the Toilet? N  In the past six months, have you accidently leaked urine? N  Do you have problems with loss of bowel control? N  Managing your Medications? N  Managing your Finances? N  Housekeeping or managing your Housekeeping? Y  Some recent data might be hidden     Cardiac Risk Factors include: advanced age (>25men, >73 women);hypertension;sedentary lifestyle  Depression Screen PHQ 2/9 Scores 07/07/2016 04/24/2016 12/11/2015 07/30/2015  PHQ - 2 Score 2 0 3 1  PHQ- 9 Score 3 - 7 -     Fall Risk Fall Risk  07/07/2016 04/24/2016 12/11/2015 07/30/2015 03/26/2015  Falls in the past year? Yes No Yes Yes Yes  Number falls in past yr: 2 or more - 1 1 2  or more  Injury with Fall? Yes - Yes Yes No  Risk Factor Category   High Fall Risk - - - -  Risk for fall due to : - - - - -  Follow up - - - - -    Cognitive Function: MMSE - Mini Mental State Exam 07/07/2016 03/06/2015  Orientation to time 5 5  Orientation to Place 5 5  Registration 3 3  Attention/ Calculation 5 5  Recall 3  3  Language- name 2 objects 2 2  Language- repeat 1 1  Language- follow 3 step command 3 3  Language- read & follow direction 1 1  Write a sentence 1 1  Copy design 1 1  Total score 30 30    Immunizations and Health Maintenance Immunization History  Administered Date(s) Administered  . Influenza,inj,Quad PF,36+ Mos 09/29/2013, 09/24/2014, 09/16/2015  . Pneumococcal Conjugate-13 09/29/2013  . Pneumococcal Polysaccharide-23 12/07/1996  . Tdap 03/06/2010  . Zoster 06/06/2006   Health Maintenance Due  Topic Date Due  . INFLUENZA VACCINE  07/07/2016    Patient Care Team: Chipper Herb, MD as PCP - General (Family Medicine) Minus Breeding, MD as Consulting Physician (Cardiology) Clent Jacks, MD as Consulting Physician (Ophthalmology) Patrici Ranks, MD as Consulting Physician (Hematology and Oncology)  Indicate any recent Medical Services you may have received from other than [facility] providers in the past year (date may be approximate).    Assessment:    Annual Wellness Visit  Obesity - BMI has decreased to below 30   Screening Tests Health Maintenance  Topic Date Due  . INFLUENZA VACCINE  07/07/2016  . TETANUS/TDAP  03/06/2020  . DEXA SCAN  Completed  . ZOSTAVAX  Completed  . PNA vac Low Risk Adult  Completed        Plan:   During the course of the visit Consepcion was educated and counseled about the following appropriate screening and preventive services:   Vaccines to include Pneumoccal, Influenza, Td, Zostavax - all vaccines are UTD  Colorectal cancer screening - FOBT is UTD  Cardiovascular disease screening - ECHO and EKG 01/2016. Sees Dr Jenkins [place] 08/2016  Diabetes screening - due recheck A1c with  next labs in about 1 month  Bone Denisty / Osteoporosis Screening - Last Dexa was 12/2015 - I cannot view results in patient chart at this time but will have our xray dept look into this.  Per patient, she will not take any medications for bone.   I did recommended continue calcium rich foods and taking calcium 600mg  qd + MVI qd (make sure to separate MVI and calcium supplement)  Fall prevention discussed  Mammogram - declined  Glaucoma screening /  Eye Exam - UTD  Nutrition counseling - continue to limit serving sizes and eat lots of non starchy vegetables and fruits.  Avoid high fat and fried foods.  Advanced Directives - information provided  Physical Activity - increase physical activity as able- start with 20 minutes 4 times per week  Use pillow between legs at night to see if pain improves.   Patient Instructions (the written plan) were given to the patient.   Cherre Robins, PharmD   07/07/2016

## 2016-07-08 DIAGNOSIS — H401233 Low-tension glaucoma, bilateral, severe stage: Secondary | ICD-10-CM | POA: Diagnosis not present

## 2016-07-08 DIAGNOSIS — H47013 Ischemic optic neuropathy, bilateral: Secondary | ICD-10-CM | POA: Diagnosis not present

## 2016-07-08 DIAGNOSIS — Z961 Presence of intraocular lens: Secondary | ICD-10-CM | POA: Diagnosis not present

## 2016-07-16 IMAGING — CT CT HEAD W/O CM
3 of 5 series · 16 of 47 positions shown, 19 images · non-contrast
Comparison: None.

CLINICAL DATA: Slipped and fell in basement this morning. Head and
neck injury and pain. Initial encounter.

EXAM:
CT HEAD WITHOUT CONTRAST
CT CERVICAL SPINE WITHOUT CONTRAST
TECHNIQUE: Multidetector CT imaging of the head and cervical spine was
performed following the standard protocol without intravenous
contrast. Multiplanar CT image reconstructions of the cervical spine
were also generated.

[Series 602: cor · coronal · 0.28mm/px · 3 of 38 slices shown]
[im 13/38  brain]
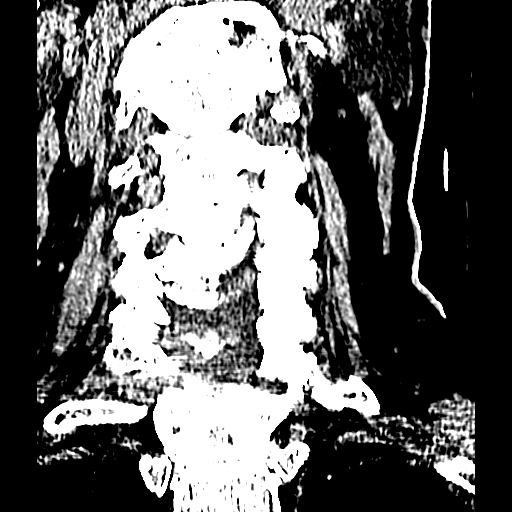
[im 17/38  brain]
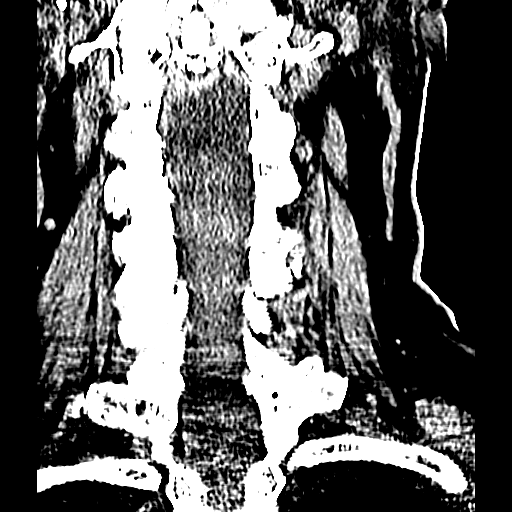
[im 21/38  brain]
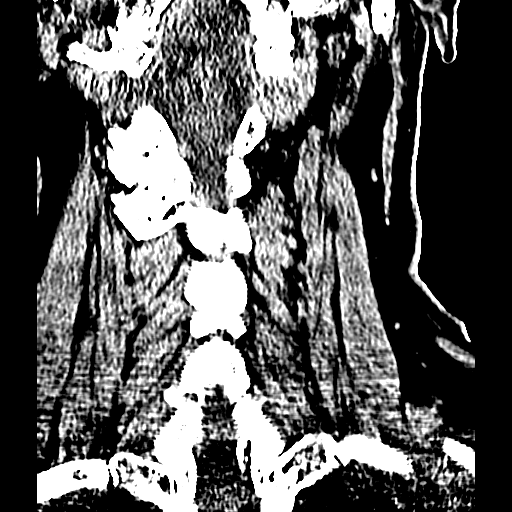

[Series 603: <mpr thick range> · axial · 0.28mm/px · z∈[-326,-202]mm · 10 of 82 slices shown, 13 images]
[im 7/82  brain]
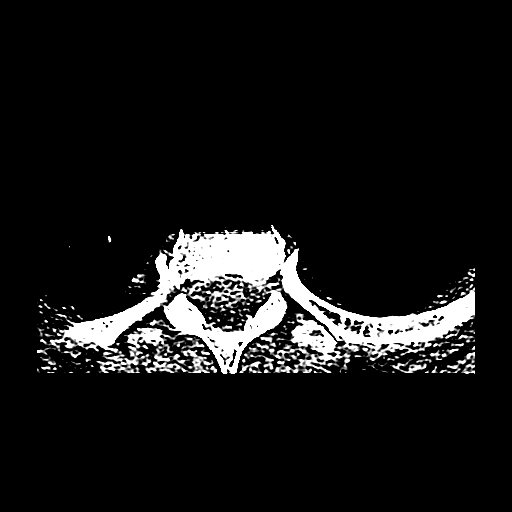
[im 7/82  bone]
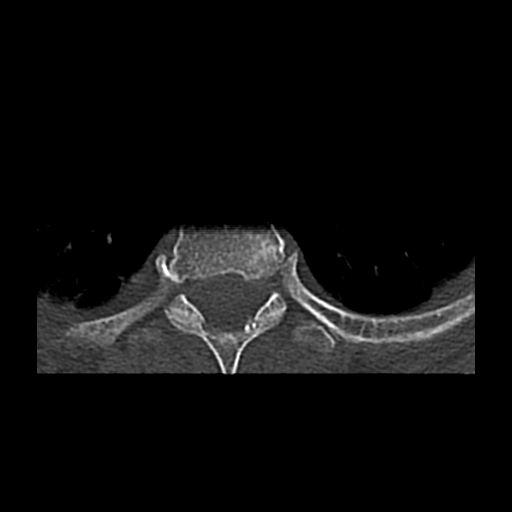
[im 14/82  brain]
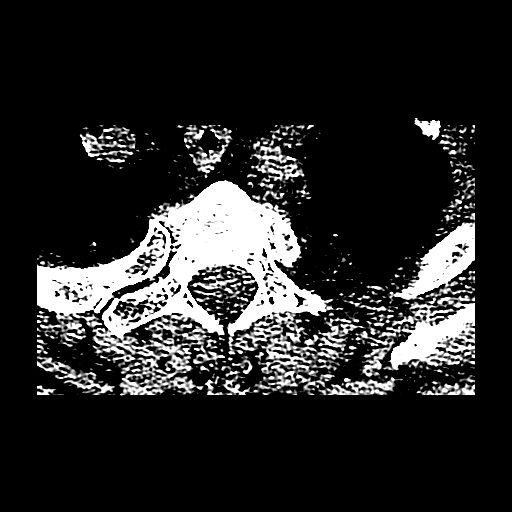
[im 21/82  brain]
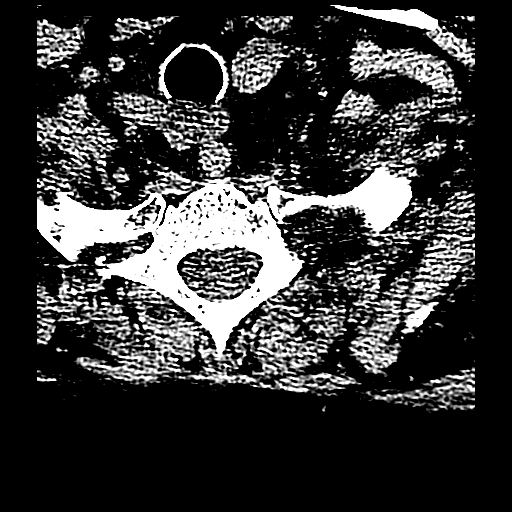
[im 28/82  brain]
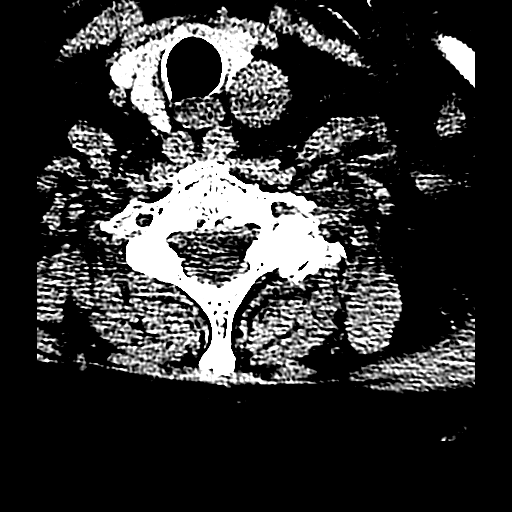
[im 34/82  brain]
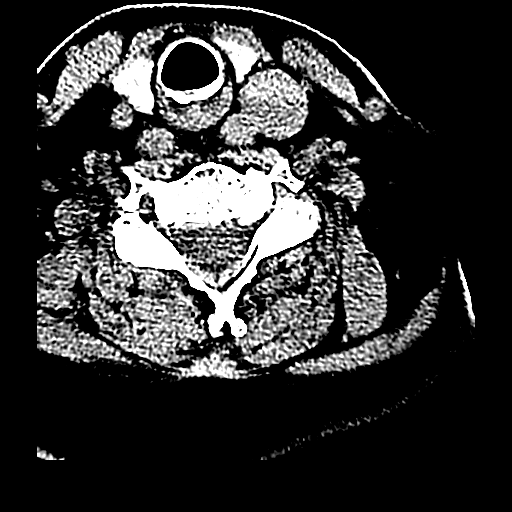
[im 34/82  bone]
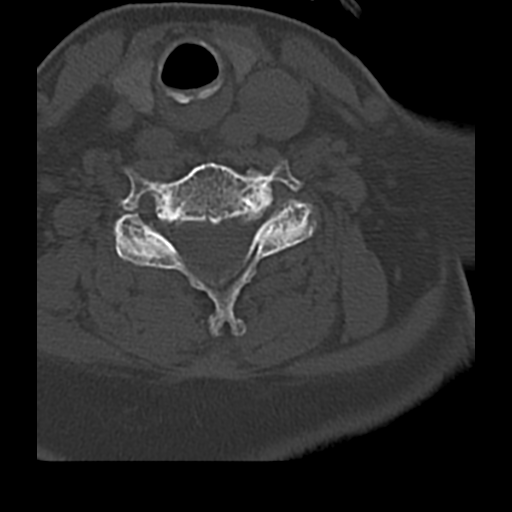
[im 48/82  brain]
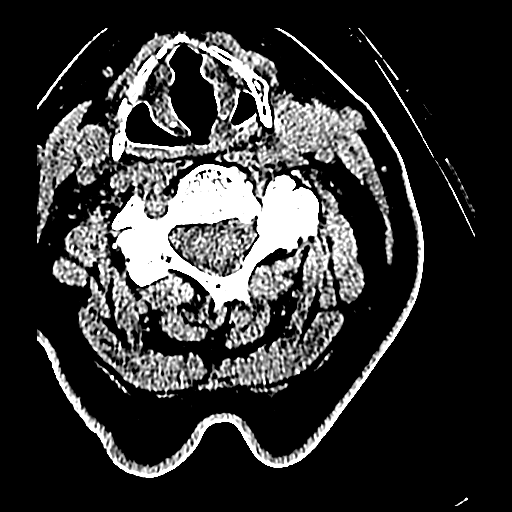
[im 55/82  brain]
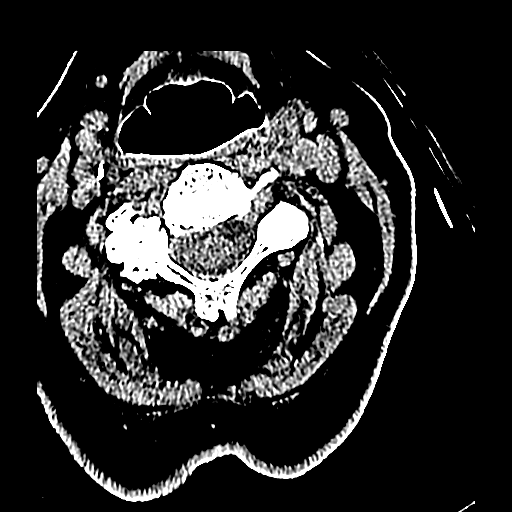
[im 61/82  brain]
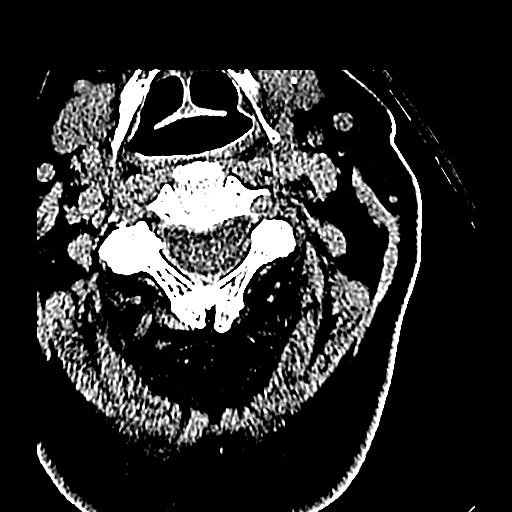
[im 68/82  brain]
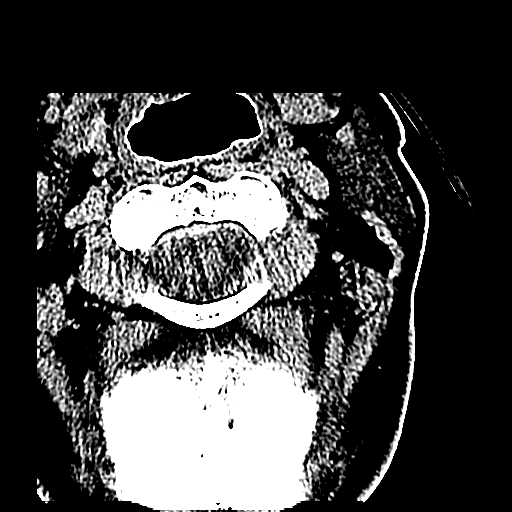
[im 68/82  bone]
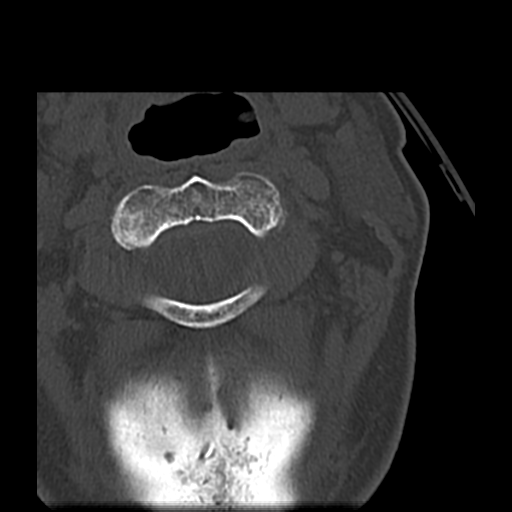
[im 75/82  brain]
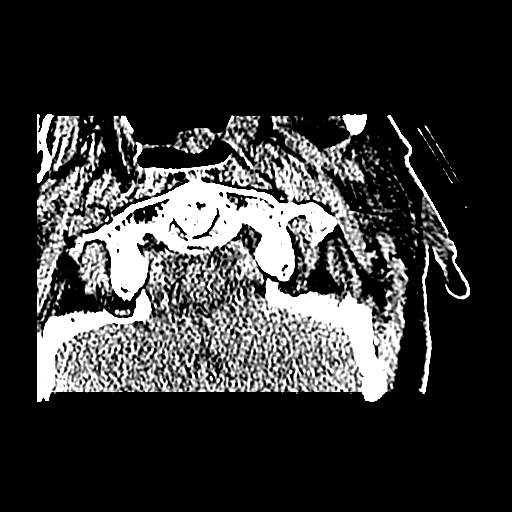

[Series 604: sag · sagittal · 0.28mm/px · 3 of 46 slices shown]
[im 16/46  brain]
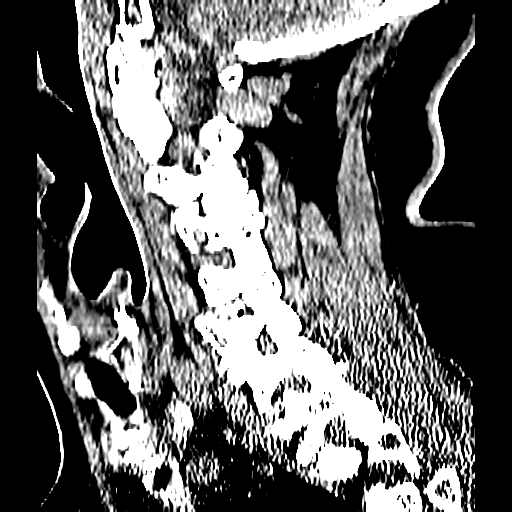
[im 23/46  brain]
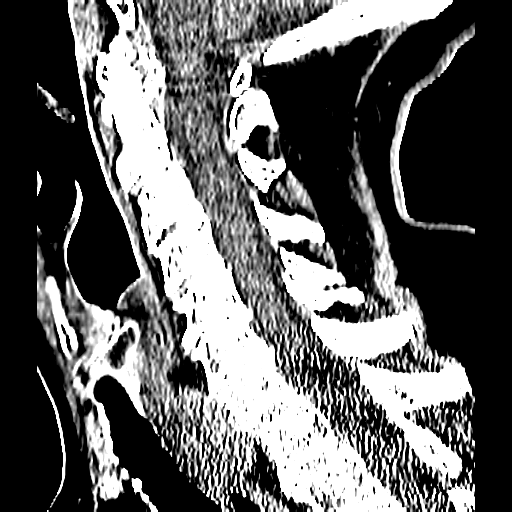
[im 31/46  brain]
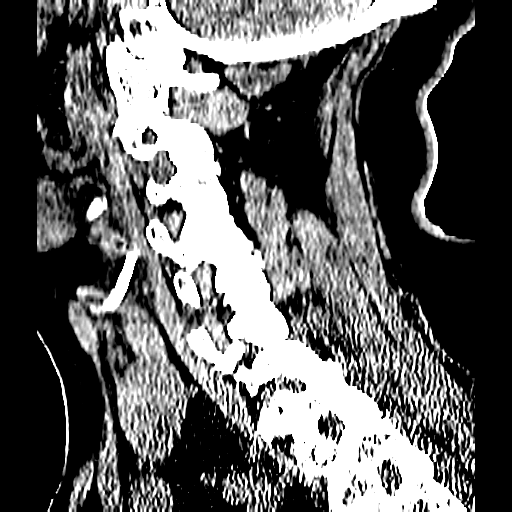

[16 of 47 positions shown; findings below may reference images not displayed]

FINDINGS: CT HEAD FINDINGS

No evidence of intracranial hemorrhage, brain edema, or other signs
of acute infarction. No evidence of intracranial mass lesion or mass
effect.

No abnormal extraaxial fluid collections identified. Ventricles are
normal in size. No skull fracture identified.

Left periorbital and preseptal soft tissue hematoma seen. Fractures
are seen involving the lateral wall and floor of left orbit as well
as the anterior, medial, and lateral walls of the left maxillary
sinus.

CT CERVICAL SPINE FINDINGS

No evidence of acute fracture, subluxation, or prevertebral soft
tissue swelling.

Mild to moderate degenerative disc disease is seen from levels of
C3-C7. Moderate to severe facet DJD is seen bilaterally at multiple
cervical levels. Advanced atlantoaxial degenerative changes also
seen.
IMPRESSION: No acute intracranial abnormality.

No evidence of acute cervical spine fracture or subluxation.
Degenerative spondylosis, as described above.

Left orbital and maxillary sinus fractures noted. Consider
maxillofacial CT for further evaluation if clinically warranted.

## 2016-07-21 ENCOUNTER — Encounter (HOSPITAL_COMMUNITY): Payer: Medicare Other | Attending: Oncology | Admitting: Oncology

## 2016-07-21 ENCOUNTER — Encounter (HOSPITAL_COMMUNITY): Payer: Medicare Other

## 2016-07-21 ENCOUNTER — Encounter (HOSPITAL_COMMUNITY): Payer: Self-pay | Admitting: Oncology

## 2016-07-21 VITALS — BP 153/88 | HR 62 | Temp 98.0°F | Resp 18 | Wt 164.3 lb

## 2016-07-21 DIAGNOSIS — D473 Essential (hemorrhagic) thrombocythemia: Secondary | ICD-10-CM

## 2016-07-21 LAB — COMPREHENSIVE METABOLIC PANEL
ALT: 12 U/L — ABNORMAL LOW (ref 14–54)
AST: 22 U/L (ref 15–41)
Albumin: 4.3 g/dL (ref 3.5–5.0)
Alkaline Phosphatase: 48 U/L (ref 38–126)
Anion gap: 4 — ABNORMAL LOW (ref 5–15)
BUN: 20 mg/dL (ref 6–20)
CHLORIDE: 107 mmol/L (ref 101–111)
CO2: 29 mmol/L (ref 22–32)
Calcium: 9.4 mg/dL (ref 8.9–10.3)
Creatinine, Ser: 1 mg/dL (ref 0.44–1.00)
GFR, EST AFRICAN AMERICAN: 58 mL/min — AB (ref >60)
GFR, EST NON AFRICAN AMERICAN: 50 mL/min — AB (ref >60)
Glucose, Bld: 95 mg/dL (ref 65–99)
POTASSIUM: 4.2 mmol/L (ref 3.5–5.1)
SODIUM: 140 mmol/L (ref 135–145)
Total Bilirubin: 0.7 mg/dL (ref 0.3–1.2)
Total Protein: 7 g/dL (ref 6.5–8.1)

## 2016-07-21 LAB — CBC WITH DIFFERENTIAL/PLATELET
BASOS ABS: 0 10*3/uL (ref 0.0–0.1)
Basophils Relative: 0 %
EOS ABS: 0.1 10*3/uL (ref 0.0–0.7)
EOS PCT: 1 %
HCT: 40.1 % (ref 36.0–46.0)
Hemoglobin: 13.3 g/dL (ref 12.0–15.0)
LYMPHS PCT: 25 %
Lymphs Abs: 2 10*3/uL (ref 0.7–4.0)
MCH: 31.1 pg (ref 26.0–34.0)
MCHC: 33.2 g/dL (ref 30.0–36.0)
MCV: 93.7 fL (ref 78.0–100.0)
MONO ABS: 0.5 10*3/uL (ref 0.1–1.0)
Monocytes Relative: 7 %
Neutro Abs: 5.2 10*3/uL (ref 1.7–7.7)
Neutrophils Relative %: 67 %
PLATELETS: 361 10*3/uL (ref 150–400)
RBC: 4.28 MIL/uL (ref 3.87–5.11)
RDW: 13.7 % (ref 11.5–15.5)
WBC: 7.7 10*3/uL (ref 4.0–10.5)

## 2016-07-21 NOTE — Progress Notes (Signed)
[place] at [place] Note  Patient Care Team: Chipper Herb, MD as PCP - General (Family Medicine) Minus Breeding, MD as Consulting Physician (Cardiology) Clent Jacks, MD as Consulting Physician (Ophthalmology) Patrici Ranks, MD as Consulting Physician (Hematology and Oncology)  CHIEF COMPLAINTS:  JAK 2 positive ET  HISTORY OF PRESENTING ILLNESS:  Kristy [name] 82 y.o. female is here for further follow-up of JAK 2 positive ET. She takes hydrea twice weekly, once on Monday and once on Thursday.  Kristy [name] is accompanied by her husband. I personally reviewed and went over laboratory studies with the patient.   She has been feeling better and is without complaint. Denies nausea, noting she takes the [facility] after eating. Describes her appetite as "too much". She does not have any questions at this time.    //   07/21/2016  Kristy [name] returns to the [facility] today, accompanied by her niece.  She confirms that she's taking all of her pills, twice a week, on Monday and Thursday. She says that, two times, she's forgotten to take her pill on Thursday morning, and has taken it Thursday night instead. She repeats that this has only happened twice.  She notes that, last time she came in, she had an issue with her vision. She had a check-up about her eyes, and notes that macular degeneration is beginning in her R eye. She is still worried that her current condition is causing vision changes.  When it comes to her diarrhea and loose stools, she comments that she goes to the bathroom about twice a day.  Her husband is not doing very well with his cancer. She notes that she thinks he has some form of dementia. She says "he's holding his own."  She is able to ambulate to the exam table for the physical examination.    MEDICAL HISTORY:  Past Medical History:  Diagnosis Date   A-fib [facility])    Cataract    Closed left arm fracture 2016   Colon  polyps    Diverticulosis    Endometrial hyperplasia without atypia, simple    Endometrial polyp    Fatty liver    GERD (gastroesophageal reflux disease)    Hiatal hernia    Hyperplastic colon polyp    Hypertension    Lipoma    Obesity    Optic neuropathy of both eyes 1989, 1997   SOB (shortness of breath)    Thrombocytosis (HCC)     SURGICAL HISTORY: Past Surgical History:  Procedure Laterality Date   breast biopsy     CATARACT EXTRACTION     CHOLECYSTECTOMY  03/2002   DILATION AND CURETTAGE OF UTERUS     x 2   EYE SURGERY     HERNIA REPAIR     KNEE SURGERY     optic neuritis     umbilical hernia repair  09/2002    SOCIAL HISTORY: Social History   Social History   Marital status: Married    Spouse name: N/A   Number of children: N/A   Years of education: N/A   Occupational History   [employer] Retired    Retired   Social History Main Topics   Smoking status: Former Smoker    Quit date: 12/07/1982   Smokeless tobacco: Never Used   Alcohol use No   Drug use: No   Sexual activity: No   Other Topics Concern   Not on file  Social History Narrative   No narrative on file   Married 65 years. Her husband is not well, he has dementia; short-term memory is gone He has just had cancer as well and was treated with radiation by Dr. Isidore Moos He still knows her; he still enjoys eating and they still go out to eat and attend church. They used to ballroom dance and she says "oh, we've had a wonderful life."  She worked up past 72. 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 Laurance Flatten.  She says she puffed smoke but never inhaled; she "puffed" while her friends were playing [employer]. She has never had problems with alcohol.  She has two living children, one dead. Her first born was  microcephalic. He lived to be 44. When she heard the news of the [place] virus this year, it brought back all of her sadness about him. She started taking Xanex to alleviate her stress about this matter. Three grandchildren and no great grandchildren.  FAMILY HISTORY: Family History  Problem Relation Age of Onset   Diabetes Mother    Diabetes Sister    Stroke Father    Diabetes Brother    Heart disease Brother    Cancer Sister     lung   Cancer Sister     vulva   Cancer Sister    Dementia Sister    Osteoporosis Sister    Diabetes Sister    Osteoporosis Sister    Diabetes Sister    Cancer Brother     bladder   Colon cancer Neg Hx    indicated that her mother is deceased. She indicated that her father is deceased. She indicated that only one of her seven sisters is alive. She indicated that all of her three brothers are deceased. She indicated that the status of her neg hx is unknown.     Her mother lived to be 53; she was diabetic. She was very healthy except for diabetes and high blood pressure. She got up one morning, she died suddenly. Father lived to be 67; he was well most of his life until the last 6 months, had a stroke. She is the baby of 13 children. She is one of two that are still living.  Her sister is 95 and still living. 6 in the family have lived into the 27's.  ALLERGIES:  is allergic to codeine and tramadol.  MEDICATIONS:  Current Outpatient Prescriptions  Medication Sig Dispense Refill   ALPRAZolam (XANAX) 0.25 MG tablet Take 1 tablet (0.25 mg total) by mouth daily as needed for anxiety. 30 tablet 2   calcium carbonate (OSCAL) 1500 (600 Ca) MG TABS tablet Take 600 mg of elemental calcium by mouth daily.     Cholecalciferol (VITAMIN D3) 2000 UNITS TABS Take by mouth daily.     estradiol (CLIMARA - DOSED IN MG/24 HR) 0.025 mg/24hr patch Place 1 patch (0.025 mg total) onto the skin once a week. 12 patch 3   hydrochlorothiazide  (HYDRODIURIL) 12.5 MG tablet TAKE 1 TABLET DAILY 30 tablet 8   hydroxyurea (HYDREA) 500 MG capsule Take 1 capsule (500 mg total) by mouth 2 (two) times a week. May take with food to minimize GI side effects. 30 capsule 1   latanoprost (XALATAN) 0.005 % ophthalmic solution Place 1 drop into both eyes daily.     metoprolol  succinate (TOPROL-XL) 25 MG 24 hr tablet Take 1 tablet (25 mg total) by mouth daily. 90 tablet 0   Multiple Vitamin (MULTIVITAMIN) tablet Take 1 tablet by mouth daily.       omeprazole (PRILOSEC) 20 MG capsule Take 1 capsule (20 mg total) by mouth daily. 90 capsule 0   ONE TOUCH ULTRA TEST test strip USE TO TEST SUGAR TWICE A DAY AS INSTRUCTED 100 each 2   Polyethyl Glycol-Propyl Glycol (SYSTANE OP) Apply 1-2 drops to eye 4 (four) times daily as needed (dry eyes).     progesterone (PROMETRIUM) 200 MG capsule 12 tabs every four months 12 capsule 12   sertraline (ZOLOFT) 50 MG tablet TAKE 1 TABLET DAILY 90 tablet 0   XARELTO 15 MG TABS tablet TAKE 1 TABLET (15 MG TOTAL) BY MOUTH DAILY. 30 tablet 1   No current facility-administered medications for this visit.     Review of Systems  Constitutional: Negative for chills, fever and weight loss.  HENT: Negative.  Negative for congestion, hearing loss, nosebleeds, sore throat and tinnitus.   Eyes: Negative.  Negative for blurred vision, double vision, pain and discharge.  Respiratory: Negative.  Negative for cough, hemoptysis, sputum production, shortness of breath and wheezing.   Cardiovascular: Negative.  Negative for chest pain, palpitations, claudication, leg swelling and PND.  Gastrointestinal: Negative for abdominal pain, blood in stool, constipation, heartburn, melena, nausea and vomiting.  Genitourinary: Negative.  Negative for dysuria, frequency, hematuria and urgency.  Musculoskeletal: Negative.  Negative for falls, joint pain and myalgias.  Skin: Negative.  Negative for itching and rash.  Neurological: Negative.   Negative for dizziness, tingling, tremors, sensory change, speech change, focal weakness, seizures, loss of consciousness, weakness and headaches.  Endo/Heme/Allergies: Negative.  Does not bruise/bleed easily.  Psychiatric/Behavioral: Negative.  Negative for depression, memory loss, substance abuse and suicidal ideas. The patient is not nervous/anxious and does not have insomnia.   All other systems reviewed and are negative.  14 point ROS was done and is otherwise as detailed above or in HPI    PHYSICAL EXAMINATION: ECOG PERFORMANCE STATUS: 1 - Symptomatic but completely ambulatory  Vitals:   07/21/16 1352  BP: (!) 153/88  Pulse: 62  Resp: 18  Temp: 98 F (36.7 C)   Filed Weights   07/21/16 1352  Weight: 164 lb 4.8 oz (74.5 kg)    Physical Exam  Constitutional: She is oriented to person, place, and time and well-developed, well-nourished, and in no distress.  Appears somewhat younger than stated age  HENT:  Head: Normocephalic and atraumatic.  Nose: Nose normal.  Mouth/Throat: Oropharynx is clear and moist. No oropharyngeal exudate.  Eyes: Conjunctivae and EOM are normal. Pupils are equal, round, and reactive to light. Right eye exhibits no discharge. Left eye exhibits no discharge. No scleral icterus.  Neck: Normal range of motion. Neck supple. No tracheal deviation present. No thyromegaly present.  Cardiovascular: Normal rate, regular rhythm and normal heart sounds.  Exam reveals no gallop and no friction rub.   No murmur heard. Pulmonary/Chest: Effort normal and breath sounds normal. She has no wheezes. She has no rales.  Abdominal: Soft. Bowel sounds are normal. She exhibits no distension and no mass. There is no tenderness. There is no rebound and no guarding.  Musculoskeletal: Normal range of motion. She exhibits no edema.  Lymphadenopathy:    She has no cervical adenopathy.  Neurological: She is alert and oriented to person, place, and time. She has normal reflexes.  No cranial  nerve deficit. Gait normal. Coordination normal.  Skin: Skin is warm and dry. No rash noted.  Psychiatric: Mood, memory, affect and judgment normal.  Nursing note and vitals reviewed.   LABORATORY DATA:  I have reviewed the data as listed.  Results for MALLY, GAVINA (MRN [id number]) as of 07/21/2016 14:23  Ref. Range 07/21/2016 13:20  Sodium Latest Ref Range: 135 - 145 mmol/L 140  Potassium Latest Ref Range: 3.5 - 5.1 mmol/L 4.2  Chloride Latest Ref Range: 101 - 111 mmol/L 107  CO2 Latest Ref Range: 22 - 32 mmol/L 29  BUN Latest Ref Range: 6 - 20 mg/dL 20  Creatinine Latest Ref Range: 0.44 - 1.00 mg/dL 1.00  Calcium Latest Ref Range: 8.9 - 10.3 mg/dL 9.4  EGFR (Non-African Amer.) Latest Ref Range: >60 mL/min 50 (L)  EGFR (African American) Latest Ref Range: >60 mL/min 58 (L)  Glucose Latest Ref Range: 65 - 99 mg/dL 95  Anion gap Latest Ref Range: 5 - 15  4 (L)  Alkaline Phosphatase Latest Ref Range: 38 - 126 U/L 48  Albumin Latest Ref Range: 3.5 - 5.0 g/dL 4.3  AST Latest Ref Range: 15 - 41 U/L 22  ALT Latest Ref Range: 14 - 54 U/L 12 (L)  Total Protein Latest Ref Range: 6.5 - 8.1 g/dL 7.0  Total Bilirubin Latest Ref Range: 0.3 - 1.2 mg/dL 0.7  WBC Latest Ref Range: 4.0 - 10.5 K/uL 7.7  RBC Latest Ref Range: 3.87 - 5.11 MIL/uL 4.28  Hemoglobin Latest Ref Range: 12.0 - 15.0 g/dL 13.3  HCT Latest Ref Range: 36.0 - 46.0 % 40.1  MCV Latest Ref Range: 78.0 - 100.0 fL 93.7  MCH Latest Ref Range: 26.0 - 34.0 pg 31.1  MCHC Latest Ref Range: 30.0 - 36.0 g/dL 33.2  RDW Latest Ref Range: 11.5 - 15.5 % 13.7  Platelets Latest Ref Range: 150 - 400 K/uL 361  Neutrophils Latest Units: % 67  Lymphocytes Latest Units: % 25  Monocytes Relative Latest Units: % 7  Eosinophil Latest Units: % 1  Basophil Latest Units: % 0  NEUT# Latest Ref Range: 1.7 - 7.7 K/uL 5.2  Lymphocyte # Latest Ref Range: 0.7 - 4.0 K/uL 2.0  Monocyte # Latest Ref Range: 0.1 - 1.0 K/uL 0.5  Eosinophils Absolute  Latest Ref Range: 0.0 - 0.7 K/uL 0.1  Basophils Absolute Latest Ref Range: 0.0 - 0.1 K/uL 0.0    ASSESSMENT & PLAN:  JAK 2 positive ET   I personally reviewed and went over laboratory studies with the patient. Her platelets continue to stay stable with current Hydrea. I have encouraged the patient to continue with current dosing every Monday and Thursday. She will contact us if she notices anything different in the coming months.  She does not need any refills at this time.   The patient is agreeable to extending her follow up visits to once every 3 months with labs. She will continue to follow with her PCP, as well. She will have a CBC at [facility] in 6 weeks.   //    07/21/2016  Everything looks good; her blood counts look good. She should continue taking her pills in the same way.  She will return for follow-up in 3 months.  We will check her labs again in 6 weeks, CBC. She will have CBC and chemistry checked before her appointment in 3 months.  No orders of the defined types were placed in this encounter.  All questions were answered.  The patient knows to call the clinic with any problems, questions or concerns. We can certainly see the patient much sooner if necessary.   This document serves as a record of services personally performed by Forest Gleason, MD. It was created on his behalf by Toni Amend, a trained medical scribe. The creation of this record is based on the scribe's personal observations and the provider's statements to them. This document has been checked and approved by the attending provider.  I have reviewed the above documentation for accuracy and completeness and I agree with the above.  This note was electronically signed by:   Toni Amend   Attending's note  I examined the patient personally reviewed all the lab data.  Discussed with the patient will continue Hydrea.  Patient was advised to continue to get ophthalmological  evaluation for her dry eyes.  Possibility of hair loss to be secondary to Hydrea 8 reevaluate patient in 6 weeks for CBC at primary care physician's office in 3 months here for reevaluation 07/21/2016 2:24 PM

## 2016-07-21 NOTE — Patient Instructions (Addendum)
[place] at [facility] Discharge Instructions  RECOMMENDATIONS MADE BY THE CONSULTANT AND ANY TEST RESULTS WILL BE SENT TO YOUR REFERRING PHYSICIAN.  Exam by Dr. Oliva Bustard today. Labs reviewed today. Labs in 6 weeks to check CBC, 4 months Follow up in 4 months  Thank you for choosing [place] at [facility] to provide your oncology and hematology care.  To afford each patient quality time with our provider, please arrive at least 15 minutes before your scheduled appointment time.   Beginning January 23rd 2017 lab work for the [facility] will be done in the  Main lab at [facility] on 1st floor. If you have a lab appointment with the [facility] please come in thru the  Main Entrance and check in at the main information desk  You need to re-schedule your appointment should you arrive 10 or more minutes late.  We strive to give you quality time with our providers, and arriving late affects you and other patients whose appointments are after yours.  Also, if you no show three or more times for appointments you may be dismissed from the clinic at the providers discretion.     Again, thank you for choosing [facility].  Our hope is that these requests will decrease the amount of time that you wait before being seen by our physicians.       _____________________________________________________________  Should you have questions after your visit to [facility], please contact our office at [phone] between the hours of 8:30 a.m. and 4:30 p.m.  Voicemails left after 4:30 p.m. will not be returned until the following business day.  For prescription refill requests, have your pharmacy contact our office.         Resources For Cancer Patients and their Caregivers ? American Cancer Society: Can assist with transportation, wigs, general needs, runs Look Good Feel Better.        (938)155-4752 ? Cancer  Care: Provides financial assistance, online support groups, medication/co-pay assistance.  1-800-813-HOPE 6287216410) ? [place] Assists [place] cancer patients and their families through emotional , educational and financial support.  865-639-2536 ? [place] DSS Where to apply for food stamps, Medicaid and utility assistance. 281-853-1710 ? [facility]: Transportation to medical appointments. 601-800-7729 ? Social Security Administration: May apply for disability if have a Stage IV cancer. 785 863 9940 (787)030-6816 ? [employer], Disability and Transit Services: Assists with nutrition, care and transit needs. [place] Support Programs: @10RELATIVEDAYS @ > Cancer Support Group  2nd Tuesday of the month 1pm-2pm, Journey Room  > Creative Journey  3rd Tuesday of the month 1130am-1pm, Journey Room  > Look Good Feel Better  1st Wednesday of the month 10am-12 noon, Journey Room (Call [place] to register [phone])

## 2016-07-30 ENCOUNTER — Encounter: Payer: Self-pay | Admitting: Cardiology

## 2016-08-10 NOTE — Progress Notes (Signed)
HPI Patient presents for follow up of atrial fib.   She had been managed with Xarelto.  She did have some pulmonary HTN and TR on echo.  However, follow up echo in Feb of this year demonstrated only mod TR and no significant pulmonary HTN.  At the last visit she did have some presyncope prompting the repeat echo and Doppler.  Since that time she has done OK from a cardiovascular standpoint.  She continues to have some dyspnea with exertion but this is not changed. She's not had any new palpitations, presyncope or syncope. She's mostly concerned because her vision is off. She thinks this is related to her thrombocytosis.  Allergies  Allergen Reactions  . Codeine Nausea Only  . Tramadol Nausea Only    Current Outpatient Prescriptions  Medication Sig Dispense Refill  . ALPRAZolam (XANAX) 0.25 MG tablet Take 1 tablet (0.25 mg total) by mouth daily as needed for anxiety. 30 tablet 2  . calcium carbonate (OSCAL) 1500 (600 Ca) MG TABS tablet Take 600 mg of elemental calcium by mouth daily.    . Cholecalciferol (VITAMIN D3) 2000 UNITS TABS Take by mouth daily.    Marland Kitchen estradiol (CLIMARA - DOSED IN MG/24 HR) 0.025 mg/24hr patch Place 1 patch (0.025 mg total) onto the skin once a week. 12 patch 3  . hydrochlorothiazide (MICROZIDE) 12.5 MG capsule Take 12.5 mg by mouth daily.    . hydroxyurea (HYDREA) 500 MG capsule Take 1 capsule (500 mg total) by mouth 2 (two) times a week. May take with food to minimize GI side effects. 30 capsule 1  . latanoprost (XALATAN) 0.005 % ophthalmic solution Place 1 drop into both eyes daily.    . metoprolol succinate (TOPROL-XL) 25 MG 24 hr tablet Take 1 tablet (25 mg total) by mouth daily. 90 tablet 0  . Multiple Vitamin (MULTIVITAMIN) tablet Take 1 tablet by mouth daily.      Marland Kitchen omeprazole (PRILOSEC) 20 MG capsule Take 1 capsule (20 mg total) by mouth daily. 90 capsule 0  . Polyethyl Glycol-Propyl Glycol (SYSTANE OP) Apply 1-2 drops to eye 4 (four) times daily as needed  (dry eyes).    . progesterone (PROMETRIUM) 200 MG capsule 12 tabs every four months 12 capsule 12  . sertraline (ZOLOFT) 50 MG tablet Take 50 mg by mouth daily.    Kristy [name] Reasons 15 MG TABS tablet TAKE 1 TABLET (15 MG TOTAL) BY MOUTH DAILY. 30 tablet 1  . ONE TOUCH ULTRA TEST test strip USE TO TEST SUGAR TWICE A DAY AS INSTRUCTED 100 each 2   No current facility-administered medications for this visit.     Past Medical History:  Diagnosis Date  . A-fib ([place])   . Cataract   . Closed left arm fracture 2016  . Colon polyps   . Diverticulosis   . Endometrial hyperplasia without atypia, simple   . Endometrial polyp   . Fatty liver   . GERD (gastroesophageal reflux disease)   . Hiatal hernia   . Hyperplastic colon polyp   . Hypertension   . Lipoma   . Obesity   . Optic neuropathy of both eyes 1989, 1997  . SOB (shortness of breath)   . Thrombocytosis ([place])     Past Surgical History:  Procedure Laterality Date  . breast biopsy    . CATARACT EXTRACTION    . CHOLECYSTECTOMY  03/2002  . DILATION AND CURETTAGE OF UTERUS     x 2  . EYE SURGERY    .  HERNIA REPAIR    . KNEE SURGERY    . optic neuritis    . umbilical hernia repair  09/2002    ROS:  As stated in the HPI and negative for all other systems.  PHYSICAL EXAM BP (!) 162/70   Pulse 62   Ht 5\' 2"  (1.575 m)   Wt 168 lb (76.2 kg)   BMI 30.73 kg/m  GENERAL:  Well appearing NECK:  No jugular venous distention, waveform within normal limits, carotid upstroke brisk and symmetric, no bruits, no thyromegaly LUNGS:  Clear to auscultation bilaterally CHEST:  Unremarkable HEART:  PMI not displaced or sustained,S1 and S2 within normal limits, no S3, no S4, no clicks, no rubs, no murmurs ABD:  Flat, positive bowel sounds normal in frequency in pitch, no bruits, no rebound, no guarding, no midline pulsatile mass, no hepatomegaly, no splenomegaly EXT:  2 plus pulses throughout, no edema, no cyanosis no clubbing   EKG:  Sinus rhythm,  rate 61  left axis deviation, left anterior fascicular block, poor anterior R wave progression, first degree AV block, no acute ST-T wave changes.  08/12/2016   ASSESSMENT AND PLAN  ATRIAL FIB:  She has had no symptomatic recurrence of this.  Kristy [name] has a CHA2DS2 - VASc score of 5 with a risk of stroke of 6.7% .  She is doing well with anticoagulation and does not seem to have any symptomatic recurrence.    HTN:  The blood pressure is slightly elevated  However, this is unusual .  She will keep a BP diary. No change in medications is indicated. We will continue with therapeutic lifestyle changes (TLC).  DYSPNEA:  This is only with exertion.  It is unchanged from previous. No change in therapy is indicated.  PRESYNCOPE:  Carotid Dopplers showed only mild plaque.

## 2016-08-12 ENCOUNTER — Ambulatory Visit (INDEPENDENT_AMBULATORY_CARE_PROVIDER_SITE_OTHER): Payer: Medicare Other | Admitting: Cardiology

## 2016-08-12 ENCOUNTER — Encounter: Payer: Self-pay | Admitting: Cardiology

## 2016-08-12 VITALS — BP 162/70 | HR 62 | Ht 62.0 in | Wt 168.0 lb

## 2016-08-12 DIAGNOSIS — I480 Paroxysmal atrial fibrillation: Secondary | ICD-10-CM | POA: Diagnosis not present

## 2016-08-12 DIAGNOSIS — I10 Essential (primary) hypertension: Secondary | ICD-10-CM | POA: Diagnosis not present

## 2016-08-12 NOTE — Patient Instructions (Signed)
Medication Instructions:  The current medical regimen is effective;  continue present plan and medications.  Follow-Up: Follow up in 6 months with Dr. Hochrein.  You will receive a letter in the mail 2 months before you are due.  Please call us when you receive this letter to schedule your follow up appointment.  If you need a refill on your cardiac medications before your next appointment, please call your pharmacy.  Thank you for choosing [facility]!!       

## 2016-08-27 DIAGNOSIS — M4806 Spinal stenosis, lumbar region: Secondary | ICD-10-CM | POA: Diagnosis not present

## 2016-09-02 ENCOUNTER — Ambulatory Visit: Payer: Medicare Other | Admitting: Family Medicine

## 2016-09-14 ENCOUNTER — Other Ambulatory Visit: Payer: Self-pay | Admitting: Family Medicine

## 2016-09-14 ENCOUNTER — Ambulatory Visit (INDEPENDENT_AMBULATORY_CARE_PROVIDER_SITE_OTHER): Payer: Medicare Other

## 2016-09-14 ENCOUNTER — Other Ambulatory Visit: Payer: Self-pay | Admitting: Orthopedic Surgery

## 2016-09-14 DIAGNOSIS — Z23 Encounter for immunization: Secondary | ICD-10-CM | POA: Diagnosis not present

## 2016-09-14 DIAGNOSIS — M48061 Spinal stenosis, lumbar region without neurogenic claudication: Secondary | ICD-10-CM

## 2016-09-21 ENCOUNTER — Other Ambulatory Visit (HOSPITAL_COMMUNITY): Payer: Self-pay | Admitting: Hematology & Oncology

## 2016-09-25 ENCOUNTER — Ambulatory Visit (INDEPENDENT_AMBULATORY_CARE_PROVIDER_SITE_OTHER): Payer: Medicare Other | Admitting: Family Medicine

## 2016-09-25 ENCOUNTER — Ambulatory Visit (INDEPENDENT_AMBULATORY_CARE_PROVIDER_SITE_OTHER): Payer: Medicare Other

## 2016-09-25 ENCOUNTER — Encounter: Payer: Self-pay | Admitting: Family Medicine

## 2016-09-25 VITALS — BP 121/51 | HR 65 | Temp 97.1°F | Ht 62.0 in | Wt 168.0 lb

## 2016-09-25 DIAGNOSIS — M545 Low back pain: Secondary | ICD-10-CM

## 2016-09-25 DIAGNOSIS — K219 Gastro-esophageal reflux disease without esophagitis: Secondary | ICD-10-CM

## 2016-09-25 DIAGNOSIS — D473 Essential (hemorrhagic) thrombocythemia: Secondary | ICD-10-CM | POA: Diagnosis not present

## 2016-09-25 DIAGNOSIS — D75839 Thrombocytosis, unspecified: Secondary | ICD-10-CM

## 2016-09-25 DIAGNOSIS — I480 Paroxysmal atrial fibrillation: Secondary | ICD-10-CM

## 2016-09-25 DIAGNOSIS — M25551 Pain in right hip: Secondary | ICD-10-CM | POA: Diagnosis not present

## 2016-09-25 DIAGNOSIS — E559 Vitamin D deficiency, unspecified: Secondary | ICD-10-CM

## 2016-09-25 DIAGNOSIS — R7309 Other abnormal glucose: Secondary | ICD-10-CM | POA: Diagnosis not present

## 2016-09-25 DIAGNOSIS — I10 Essential (primary) hypertension: Secondary | ICD-10-CM | POA: Diagnosis not present

## 2016-09-25 DIAGNOSIS — Z1589 Genetic susceptibility to other disease: Secondary | ICD-10-CM

## 2016-09-25 NOTE — Patient Instructions (Addendum)
Medicare Annual Wellness Visit  [facility] and the medical providers at [facility] strive to bring you the best medical care.  In doing so we not only want to address your current medical conditions and concerns but also to detect new conditions early and prevent illness, disease and health-related problems.    Medicare offers a yearly Wellness Visit which allows our clinical staff to assess your need for preventative services including immunizations, lifestyle education, counseling to decrease risk of preventable diseases and screening for fall risk and other medical concerns.    This visit is provided free of charge (no copay) for all Medicare recipients. The clinical pharmacists at [facility] have begun to conduct these Wellness Visits which will also include a thorough review of all your medications.    As you primary medical provider recommend that you make an appointment for your Annual Wellness Visit if you have not done so already this year.  You may set up this appointment before you leave today or you may call back [phone] and schedule an appointment.  Please make sure when you call that you mention that you are scheduling your Annual Wellness Visit with the clinical pharmacist so that the appointment may be made for the proper length of time.     Continue current medications. Continue good therapeutic lifestyle changes which include good diet and exercise. Fall precautions discussed with patient. If an FOBT was given today- please return it to our front desk. If you are over 57 years old - you may need Prevnar 64 or the adult Pneumonia vaccine.  **Flu shots are available--- please call and schedule a FLU-CLINIC appointment**  After your visit with [place] today you will receive a survey in the mail or online from [employer] regarding your care with [place]. Please take a moment to fill this out. Your feedback is very  important to [place] as you can help [place] better understand your patient needs as well as improve your experience and satisfaction. WE CARE ABOUT YOU!!!   We will get x-rays today of the lumbar spine and right hip. After reviewing x-rays we will decide what direction to take next to help your right hip pain get better. For now continue to take Tylenol and use warm wet compresses to the right hip area.

## 2016-09-25 NOTE — Progress Notes (Signed)
Subjective:    Patient ID: Kristy [name], female    DOB: 14-Aug-1928, 82 y.o.   MRN: [id number]  HPI Pt here for follow up and management of chronic medical problems which includes hypertension and a fib. She is taking medications regularly.The patient is doing well overall. She will get a traditional lab work today. She does not get any more pelvic exams or mammograms. She is still followed by the hematologist because of her thrombocytosis. The patient is doing well overall. She is tried taking Zantac and alternating that with her proton pump inhibitor is still having a lot of breakthrough reflux. She will go back on the proton pump inhibitor because the benefit outweighs the risk. She denies any chest pain and has no more shortness of breath than usual. She is swallowing her food well and her bowels are working well without any blood or black tarry bowel movements other than some blood from hemorrhoids. She has had this for years. She is passing her water frequently but no burning or pain. One concern is that she fell in her kitchen in September and fell on her right hip she did not have pain initially but now has developed a lot of trouble with walking especially with pain in the right hip. She did see the orthopedist but at the time was not having any pain in the hip. Patient has a history of spondylosis in the cervical spine. She is still followed by the hematologist for her so thrombocytosis.    Patient Active Problem List   Diagnosis Date Noted  . Metabolic syndrome 01/65/5374  . 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 Essential thrombocytosis ([place]) 01/24/2016  . PAF (paroxysmal atrial fibrillation) ([place]) 12/18/2013  . Cervical spondylosis without myelopathy 04/27/2013  . ABDOMINAL PAIN, UNSPECIFIED SITE 02/02/2011  . Palpitations 02/26/2010  . COLONIC POLYPS, HYPERPLASTIC 12/07/2007  . LIPOMA 12/07/2007  . OBESITY 12/07/2007  . Essential hypertension 12/07/2007  . GERD 12/07/2007  . HIATAL  HERNIA 12/07/2007  . DIVERTICULOSIS, COLON 12/07/2007  . FATTY LIVER DISEASE 12/07/2007  . ENDOMETRIAL POLYP 12/07/2007  . COMPLEX ENDOMETRIAL HYPERPLASIA WITHOUT ATYPIA 12/07/2007  . SOB 12/07/2007   Outpatient Encounter Prescriptions as of 09/25/2016  Medication Sig  . ALPRAZolam (XANAX) 0.25 MG tablet Take 1 tablet (0.25 mg total) by mouth daily as needed for anxiety.  . calcium carbonate (OSCAL) 1500 (600 Ca) MG TABS tablet Take 600 mg of elemental calcium by mouth daily.  . Cholecalciferol (VITAMIN D3) 2000 UNITS TABS Take by mouth daily.  Marland Kitchen estradiol (CLIMARA - DOSED IN MG/24 HR) 0.025 mg/24hr patch Place 1 patch (0.025 mg total) onto the skin once a week.  . hydrochlorothiazide (MICROZIDE) 12.5 MG capsule Take 12.5 mg by mouth daily.  . hydroxyurea (HYDREA) 500 MG capsule TAKE (1) CAPSULE TWICE A WEEK. MAY TAKE WITH FOOD TO MINIMIZE GI EFFECTES  . latanoprost (XALATAN) 0.005 % ophthalmic solution Place 1 drop into both eyes daily.  . metoprolol succinate (TOPROL-XL) 25 MG 24 hr tablet Take 1 tablet (25 mg total) by mouth daily.  . Multiple Vitamin (MULTIVITAMIN) tablet Take 1 tablet by mouth daily.    Marland Kitchen omeprazole (PRILOSEC) 20 MG capsule Take 1 capsule (20 mg total) by mouth daily.  . ONE TOUCH ULTRA TEST test strip USE TO TEST SUGAR TWICE A DAY AS INSTRUCTED  . Polyethyl Glycol-Propyl Glycol (SYSTANE OP) Apply 1-2 drops to eye 4 (four) times daily as needed (dry eyes).  . progesterone (PROMETRIUM) 200 MG capsule 12 tabs  every four months  . sertraline (ZOLOFT) 50 MG tablet TAKE 1 TABLET DAILY  . XARELTO 15 MG TABS tablet TAKE 1 TABLET (15 MG TOTAL) BY MOUTH DAILY.   No facility-administered encounter medications on file as of 09/25/2016.       Review of Systems  Constitutional: Negative.   HENT: Negative.   Eyes: Negative.   Respiratory: Negative.   Cardiovascular: Negative.   Gastrointestinal: Negative.   Endocrine: Negative.   Genitourinary: Negative.     Musculoskeletal: Positive for arthralgias (right hip pain at times).  Skin: Negative.   Allergic/Immunologic: Negative.   Neurological: Negative.   Hematological: Negative.   Psychiatric/Behavioral: Negative.        Objective:   Physical Exam  Constitutional: She is oriented to person, place, and time. She appears well-developed and well-nourished. No distress.  The patient is pleasant and alert.  HENT:  Head: Normocephalic and atraumatic.  Right Ear: External ear normal.  Left Ear: External ear normal.  Nose: Nose normal.  Mouth/Throat: Oropharynx is clear and moist.  Eyes: Conjunctivae and EOM are normal. Pupils are equal, round, and reactive to light. Right eye exhibits no discharge. Left eye exhibits no discharge. No scleral icterus.  Neck: Normal range of motion. Neck supple. No thyromegaly present.  Cardiovascular: Normal rate, regular rhythm and intact distal pulses.   No murmur heard. The heart has a regular rate and rhythm at 72/m  Pulmonary/Chest: Effort normal and breath sounds normal. No respiratory distress. She has no wheezes. She has no rales. She exhibits no tenderness.  Clear anteriorly and posteriorly  Abdominal: Soft. Bowel sounds are normal. She exhibits no mass. There is no tenderness. There is no rebound and no guarding.  No abdominal tenderness masses or bruits or organ enlargement  Musculoskeletal: Normal range of motion. She exhibits tenderness. She exhibits no edema.  She has good leg raising bilaterally. She is slightly tender at the right lateral hip area. There is no spine tenderness.  Lymphadenopathy:    She has no cervical adenopathy.  Neurological: She is alert and oriented to person, place, and time. She has normal reflexes. No cranial nerve deficit.  Skin: Skin is warm and dry. No rash noted.  Psychiatric: She has a normal mood and affect. Her behavior is normal. Judgment and thought content normal.  Nursing note and vitals reviewed.  BP (!)  121/51 (BP Location: Left Arm)   Pulse 65   Temp 97.1 F (36.2 C) (Oral)   Ht _0  (1.575 m)   Wt 168 lb (76.2 kg)   BMI 30.73 kg/m   X-rays of right hip and LS spine pending     Assessment & Plan:  1. Vitamin D deficiency -Continue current treatment pending results of lab work - CBC with Differential/Platelet - VITAMIN D 25 Hydroxy (Vit-D Deficiency, Fractures)  2. Paroxysmal atrial fibrillation (HCC) -Continue follow-up with cardiology. The rhythm today is regular at 72/m. - CBC with Differential/Platelet - Lipid panel  3. Essential hypertension -The blood pressure is good today and she should continue with current treatment. - BMP8+EGFR - CBC with Differential/Platelet - Lipid panel - Hepatic function panel  4. Gastroesophageal reflux disease, esophagitis presence not specified -Since the patient has been having increasing bouts of heartburn and reflux with trying to switch to ranitidine she will go back on her proton pump inhibitor and take this more regularly. - CBC with Differential/Platelet - Hepatic function panel  5. JAK2 V617F mutation -Continue to follow-up with hematology  6. Thrombocytosis (  [place]) -Continue to follow-up with hematology -Get CBC today.  7. Right hip pain -Review x-rays and then make decision regarding further management of right hip pain. - DG HIP UNILAT W OR W/O PELVIS 2-3 VIEWS RIGHT; Future  Patient Instructions                       Medicare Annual Wellness Visit  [facility] and the medical providers at [facility] strive to bring you the best medical care.  In doing so we not only want to address your current medical conditions and concerns but also to detect new conditions early and prevent illness, disease and health-related problems.    Medicare offers a yearly Wellness Visit which allows our clinical staff to assess your need for preventative services including immunizations, lifestyle education,  counseling to decrease risk of preventable diseases and screening for fall risk and other medical concerns.    This visit is provided free of charge (no copay) for all Medicare recipients. The clinical pharmacists at [facility] have begun to conduct these Wellness Visits which will also include a thorough review of all your medications.    As you primary medical provider recommend that you make an appointment for your Annual Wellness Visit if you have not done so already this year.  You may set up this appointment before you leave today or you may call back [phone]) and schedule an appointment.  Please make sure when you call that you mention that you are scheduling your Annual Wellness Visit with the clinical pharmacist so that the appointment may be made for the proper length of time.     Continue current medications. Continue good therapeutic lifestyle changes which include good diet and exercise. Fall precautions discussed with patient. If an FOBT was given today- please return it to our front desk. If you are over 18 years old - you may need Prevnar 58 or the adult Pneumonia vaccine.  **Flu shots are available--- please call and schedule a FLU-CLINIC appointment**  After your visit with [place] today you will receive a survey in the mail or online from [employer] regarding your care with [place]. Please take a moment to fill this out. Your feedback is very important to [place] as you can help [place] better understand your patient needs as well as improve your experience and satisfaction. WE CARE ABOUT YOU!!!   We will get x-rays today of the lumbar spine and right hip. After reviewing x-rays we will decide what direction to take next to help your right hip pain get better. For now continue to take Tylenol and use warm wet compresses to the right hip area.    Arrie Senate MD

## 2016-09-25 NOTE — Addendum Note (Signed)
Addended by: Zannie Cove on: 09/25/2016 04:02 PM   Modules accepted: Orders

## 2016-09-26 LAB — BMP8+EGFR
BUN/Creatinine Ratio: 17 (ref 12–28)
BUN: 18 mg/dL (ref 8–27)
CO2: 27 mmol/L (ref 18–29)
CREATININE: 1.09 mg/dL — AB (ref 0.57–1.00)
Calcium: 9.9 mg/dL (ref 8.7–10.3)
Chloride: 101 mmol/L (ref 96–106)
GFR, EST AFRICAN AMERICAN: 53 mL/min/{1.73_m2} — AB (ref >59)
GFR, EST NON AFRICAN AMERICAN: 46 mL/min/{1.73_m2} — AB (ref >59)
GLUCOSE: 141 mg/dL — AB (ref 65–99)
POTASSIUM: 4.3 mmol/L (ref 3.5–5.2)
SODIUM: 143 mmol/L (ref 134–144)

## 2016-09-26 LAB — HEPATIC FUNCTION PANEL
ALBUMIN: 4.2 g/dL (ref 3.5–4.7)
ALK PHOS: 63 IU/L (ref 39–117)
ALT: 9 IU/L (ref 0–32)
AST: 20 IU/L (ref 0–40)
Bilirubin Total: 0.3 mg/dL (ref 0.0–1.2)
Bilirubin, Direct: 0.09 mg/dL (ref 0.00–0.40)
TOTAL PROTEIN: 6.3 g/dL (ref 6.0–8.5)

## 2016-09-26 LAB — VITAMIN D 25 HYDROXY (VIT D DEFICIENCY, FRACTURES): VIT D 25 HYDROXY: 50.2 ng/mL (ref 30.0–100.0)

## 2016-09-26 LAB — CBC WITH DIFFERENTIAL/PLATELET
BASOS: 0 %
Basophils Absolute: 0 10*3/uL (ref 0.0–0.2)
EOS (ABSOLUTE): 0.1 10*3/uL (ref 0.0–0.4)
EOS: 2 %
HEMATOCRIT: 36.3 % (ref 34.0–46.6)
Hemoglobin: 12.3 g/dL (ref 11.1–15.9)
IMMATURE GRANS (ABS): 0 10*3/uL (ref 0.0–0.1)
IMMATURE GRANULOCYTES: 0 %
Lymphocytes Absolute: 1.6 10*3/uL (ref 0.7–3.1)
Lymphs: 27 %
MCH: 30.8 pg (ref 26.6–33.0)
MCHC: 33.9 g/dL (ref 31.5–35.7)
MCV: 91 fL (ref 79–97)
MONOS ABS: 0.4 10*3/uL (ref 0.1–0.9)
Monocytes: 7 %
NEUTROS ABS: 3.8 10*3/uL (ref 1.4–7.0)
Neutrophils: 64 %
PLATELETS: 329 10*3/uL (ref 150–379)
RBC: 4 x10E6/uL (ref 3.77–5.28)
RDW: 14.7 % (ref 12.3–15.4)
WBC: 5.8 10*3/uL (ref 3.4–10.8)

## 2016-09-26 LAB — LIPID PANEL
CHOL/HDL RATIO: 3.4 ratio (ref 0.0–4.4)
Cholesterol, Total: 179 mg/dL (ref 100–199)
HDL: 53 mg/dL (ref >39)
LDL Calculated: 92 mg/dL (ref 0–99)
Triglycerides: 172 mg/dL — ABNORMAL HIGH (ref 0–149)
VLDL CHOLESTEROL CAL: 34 mg/dL (ref 5–40)

## 2016-09-28 ENCOUNTER — Telehealth: Payer: Self-pay | Admitting: *Deleted

## 2016-09-28 NOTE — Telephone Encounter (Signed)
-----   Message from Chipper Herb, MD sent at 09/26/2016 10:17 PM EDT ----- The blood sugar is elevated at 141. Please have the lab to add a hemoglobin A1c.++++++ the creatinine, the most important kidney function test is slightly elevated and this has been the case in the past. The electrolytes including potassium are good. The CBC has a normal white blood cell count. The hemoglobin is good at 12.3 and the platelet count is adequate. Cholesterol numbers with traditional lipid testing have a total cholesterol that is good at 179. Triglycerides are elevated at 172 and this will not be accurate because the patient was not fasting. The LDL C is good at 92. The HDL or the good cholesterol is within normal limits.-------------- patient should continue with as aggressive therapeutic lifestyle changes as possible which include diet and exercise All liver function tests are within normal limits The vitamin D level was good at 50.2 and she should continue with current treatment

## 2016-09-28 NOTE — Telephone Encounter (Signed)
Pt notified of results Verbalizes understanding 

## 2016-09-29 LAB — HGB A1C W/O EAG: Hgb A1c MFr Bld: 5.7 % — ABNORMAL HIGH (ref 4.8–5.6)

## 2016-09-29 LAB — SPECIMEN STATUS REPORT

## 2016-10-03 ENCOUNTER — Other Ambulatory Visit: Payer: Self-pay | Admitting: Family Medicine

## 2016-10-16 ENCOUNTER — Other Ambulatory Visit: Payer: Self-pay | Admitting: Family Medicine

## 2016-11-06 ENCOUNTER — Encounter: Payer: Self-pay | Admitting: Family Medicine

## 2016-11-06 ENCOUNTER — Ambulatory Visit (INDEPENDENT_AMBULATORY_CARE_PROVIDER_SITE_OTHER): Payer: Medicare Other

## 2016-11-06 ENCOUNTER — Ambulatory Visit (INDEPENDENT_AMBULATORY_CARE_PROVIDER_SITE_OTHER): Payer: Medicare Other | Admitting: Family Medicine

## 2016-11-06 VITALS — BP 152/58 | HR 71 | Temp 97.2°F | Ht 62.0 in | Wt 166.0 lb

## 2016-11-06 DIAGNOSIS — R05 Cough: Secondary | ICD-10-CM

## 2016-11-06 DIAGNOSIS — J209 Acute bronchitis, unspecified: Secondary | ICD-10-CM

## 2016-11-06 DIAGNOSIS — R059 Cough, unspecified: Secondary | ICD-10-CM

## 2016-11-06 DIAGNOSIS — D473 Essential (hemorrhagic) thrombocythemia: Secondary | ICD-10-CM

## 2016-11-06 DIAGNOSIS — I480 Paroxysmal atrial fibrillation: Secondary | ICD-10-CM | POA: Diagnosis not present

## 2016-11-06 DIAGNOSIS — I700 Atherosclerosis of aorta: Secondary | ICD-10-CM | POA: Insufficient documentation

## 2016-11-06 DIAGNOSIS — D75839 Thrombocytosis, unspecified: Secondary | ICD-10-CM

## 2016-11-06 MED ORDER — AZITHROMYCIN 250 MG PO TABS: ORAL_TABLET | ORAL | 0 refills | Status: DC

## 2016-11-06 NOTE — Progress Notes (Signed)
Subjective:    Patient ID: Kristy [name], female    DOB: 08-03-1928, 82 y.o.   MRN: [id number]  HPI  Patient here today for cough and congestion that started 2 weeks ago.The patient has not been running any fever. She has had cold cough and congestion. She is concerned that she may have pneumonia because it's been lasting for so long. The sputum is green in color. She is not been running any fever as mentioned. She does have some nasal congestion but mostly chest congestion. She says she just can't seem to throw this off. She is alert and not having any other symptoms with any other organ system.    Patient Active Problem List   Diagnosis Date Noted  . Metabolic syndrome [id number]  . 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 Essential thrombocytosis ([place]) 01/24/2016  . PAF (paroxysmal atrial fibrillation) ([place]) 12/18/2013  . Cervical spondylosis without myelopathy 04/27/2013  . ABDOMINAL PAIN, UNSPECIFIED SITE 02/02/2011  . Palpitations 02/26/2010  . COLONIC POLYPS, HYPERPLASTIC 12/07/2007  . LIPOMA 12/07/2007  . OBESITY 12/07/2007  . Essential hypertension 12/07/2007  . GERD 12/07/2007  . HIATAL HERNIA 12/07/2007  . DIVERTICULOSIS, COLON 12/07/2007  . FATTY LIVER DISEASE 12/07/2007  . ENDOMETRIAL POLYP 12/07/2007  . COMPLEX ENDOMETRIAL HYPERPLASIA WITHOUT ATYPIA 12/07/2007  . SOB 12/07/2007   Outpatient Encounter Prescriptions as of 11/06/2016  Medication Sig  . ALPRAZolam (XANAX) 0.25 MG tablet Take 1 tablet (0.25 mg total) by mouth daily as needed for anxiety.  . calcium carbonate (OSCAL) 1500 (600 Ca) MG TABS tablet Take 600 mg of elemental calcium by mouth daily.  . Cholecalciferol (VITAMIN D3) 2000 UNITS TABS Take by mouth daily.  Marland Kitchen estradiol (CLIMARA - DOSED IN MG/24 HR) 0.025 mg/24hr patch Place 1 patch (0.025 mg total) onto the skin once a week.  . hydrochlorothiazide (MICROZIDE) 12.5 MG capsule Take 12.5 mg by mouth daily.  . hydroxyurea (HYDREA) 500 MG capsule TAKE (1)  CAPSULE TWICE A WEEK. MAY TAKE WITH FOOD TO MINIMIZE GI EFFECTES  . latanoprost (XALATAN) 0.005 % ophthalmic solution Place 1 drop into both eyes daily.  . metoprolol succinate (TOPROL-XL) 25 MG 24 hr tablet TAKE 1 TABLET DAILY  . Multiple Vitamin (MULTIVITAMIN) tablet Take 1 tablet by mouth daily.    Marland Kitchen omeprazole (PRILOSEC) 20 MG capsule TAKE (1) CAPSULE DAILY  . ONE TOUCH ULTRA TEST test strip USE TO TEST SUGAR TWICE A DAY AS INSTRUCTED  . Polyethyl Glycol-Propyl Glycol (SYSTANE OP) Apply 1-2 drops to eye 4 (four) times daily as needed (dry eyes).  . progesterone (PROMETRIUM) 200 MG capsule 12 tabs every four months  . sertraline (ZOLOFT) 50 MG tablet TAKE 1 TABLET DAILY  . XARELTO 15 MG TABS tablet TAKE 1 TABLET (15 MG TOTAL) BY MOUTH DAILY.   No facility-administered encounter medications on file as of 11/06/2016.      Review of Systems  Constitutional: Negative.  Negative for fever.  HENT: Positive for congestion. Negative for postnasal drip and sinus pressure.   Eyes: Negative.   Respiratory: Positive for cough.   Cardiovascular: Negative.   Gastrointestinal: Negative.   Endocrine: Negative.   Genitourinary: Negative.   Musculoskeletal: Negative.   Skin: Negative.   Allergic/Immunologic: Negative.   Neurological: Negative.   Hematological: Negative.   Psychiatric/Behavioral: Negative.        Objective:   Physical Exam  Constitutional: She is oriented to person, place, and time. She appears well-developed and well-nourished. No distress.  HENT:  Head: Normocephalic  and atraumatic.  Mouth/Throat: Oropharynx is clear and moist.  Bilateral ears cerumen and nasal congestion bilaterally  Eyes: Conjunctivae and EOM are normal. Pupils are equal, round, and reactive to light. Right eye exhibits no discharge. Left eye exhibits no discharge. No scleral icterus.  Neck: Normal range of motion. Neck supple. No thyromegaly present.  No anterior cervical adenopathy or bruits    Cardiovascular: Normal rate, regular rhythm and normal heart sounds.   No murmur heard. Pulmonary/Chest: Effort normal and breath sounds normal. No respiratory distress. She has no wheezes. She has no rales.  Rhonchi with coughing but otherwise lungs are clear anteriorly and posteriorly  Musculoskeletal: Normal range of motion. She exhibits no edema.  Lymphadenopathy:    She has no cervical adenopathy.  Neurological: She is alert and oriented to person, place, and time.  Skin: Skin is warm and dry. No rash noted.  Psychiatric: She has a normal mood and affect. Her behavior is normal. Judgment and thought content normal.  Nursing note and vitals reviewed.  BP (!) 152/58 (BP Location: Left Arm)   Pulse 71   Temp 97.2 F (36.2 C) (Oral)   Ht 5\' 2"  (1.575 m)   Wt 166 lb (75.3 kg)   BMI 30.36 kg/m   CBC and chest x-ray with results pending       Assessment & Plan:  1. Cough -Continue to take Mucinex maximum strength 1 twice daily with a large glass of water -Use nasal saline 1 spray each nostril at least 4 times daily -Drink plenty of fluids and stay well hydrated - DG Chest 2 View; Future - CBC with Differential/Platelet  2. Acute bronchitis, unspecified organism -Take Z-Pak as directed and Mucinex and nasal saline as indicated above  3. Thrombocytosis ([place]) -Follow-up with hematologist as planned  4.Paroxysmal atrial fibrillation -The heart was in normal sinus rhythm today.   Meds ordered this encounter  Medications  . azithromycin (ZITHROMAX) 250 MG tablet    Sig: As directed    Dispense:  6 tablet    Refill:  0   Patient Instructions  Continue with Mucinex maximum strength, 1 twice daily with a large glass of water Add nasal saline 1 spray each nostril at least 4 times daily Drink plenty of fluids and stay well hydrated Take antibiotic as directed If not better by middle of next week give [place] a call back We will call the chest x-ray results and CBC results as  soon as these come in  Arrie Senate MD

## 2016-11-06 NOTE — Patient Instructions (Signed)
Continue with Mucinex maximum strength, 1 twice daily with a large glass of water Add nasal saline 1 spray each nostril at least 4 times daily Drink plenty of fluids and stay well hydrated Take antibiotic as directed If not better by middle of next week give [place] a call back We will call the chest x-ray results and CBC results as soon as these come in

## 2016-11-07 LAB — CBC WITH DIFFERENTIAL/PLATELET
BASOS: 1 %
Basophils Absolute: 0 10*3/uL (ref 0.0–0.2)
EOS (ABSOLUTE): 0.1 10*3/uL (ref 0.0–0.4)
Eos: 1 %
HEMATOCRIT: 38.9 % (ref 34.0–46.6)
Hemoglobin: 13 g/dL (ref 11.1–15.9)
IMMATURE GRANULOCYTES: 0 %
Immature Grans (Abs): 0 10*3/uL (ref 0.0–0.1)
LYMPHS ABS: 2.5 10*3/uL (ref 0.7–3.1)
Lymphs: 29 %
MCH: 30.5 pg (ref 26.6–33.0)
MCHC: 33.4 g/dL (ref 31.5–35.7)
MCV: 91 fL (ref 79–97)
MONOS ABS: 0.4 10*3/uL (ref 0.1–0.9)
Monocytes: 5 %
NEUTROS PCT: 64 %
Neutrophils Absolute: 5.6 10*3/uL (ref 1.4–7.0)
PLATELETS: 424 10*3/uL — AB (ref 150–379)
RBC: 4.26 x10E6/uL (ref 3.77–5.28)
RDW: 15.1 % (ref 12.3–15.4)
WBC: 8.5 10*3/uL (ref 3.4–10.8)

## 2016-11-13 ENCOUNTER — Other Ambulatory Visit: Payer: Self-pay | Admitting: Family Medicine

## 2016-11-20 ENCOUNTER — Encounter (HOSPITAL_COMMUNITY): Payer: Medicare Other | Attending: Hematology & Oncology | Admitting: Hematology & Oncology

## 2016-11-20 ENCOUNTER — Encounter (HOSPITAL_COMMUNITY): Payer: Self-pay | Admitting: Hematology & Oncology

## 2016-11-20 ENCOUNTER — Encounter (HOSPITAL_COMMUNITY): Payer: Medicare Other

## 2016-11-20 VITALS — BP 162/81 | HR 69 | Temp 98.1°F | Resp 18 | Wt 164.3 lb

## 2016-11-20 DIAGNOSIS — D473 Essential (hemorrhagic) thrombocythemia: Secondary | ICD-10-CM | POA: Diagnosis not present

## 2016-11-20 DIAGNOSIS — Z1589 Genetic susceptibility to other disease: Secondary | ICD-10-CM

## 2016-11-20 LAB — CBC WITH DIFFERENTIAL/PLATELET
Basophils Absolute: 0 10*3/uL (ref 0.0–0.1)
Basophils Relative: 0 %
EOS ABS: 0 10*3/uL (ref 0.0–0.7)
EOS PCT: 1 %
HCT: 38.4 % (ref 36.0–46.0)
Hemoglobin: 12.8 g/dL (ref 12.0–15.0)
LYMPHS ABS: 1.8 10*3/uL (ref 0.7–4.0)
LYMPHS PCT: 22 %
MCH: 31.4 pg (ref 26.0–34.0)
MCHC: 33.3 g/dL (ref 30.0–36.0)
MCV: 94.3 fL (ref 78.0–100.0)
MONO ABS: 0.5 10*3/uL (ref 0.1–1.0)
MONOS PCT: 6 %
Neutro Abs: 5.9 10*3/uL (ref 1.7–7.7)
Neutrophils Relative %: 71 %
PLATELETS: 387 10*3/uL (ref 150–400)
RBC: 4.07 MIL/uL (ref 3.87–5.11)
RDW: 14.2 % (ref 11.5–15.5)
WBC: 8.3 10*3/uL (ref 4.0–10.5)

## 2016-11-20 LAB — COMPREHENSIVE METABOLIC PANEL
ALT: 12 U/L — AB (ref 14–54)
ANION GAP: 8 (ref 5–15)
AST: 20 U/L (ref 15–41)
Albumin: 4.3 g/dL (ref 3.5–5.0)
Alkaline Phosphatase: 44 U/L (ref 38–126)
BUN: 20 mg/dL (ref 6–20)
CALCIUM: 9.7 mg/dL (ref 8.9–10.3)
CHLORIDE: 105 mmol/L (ref 101–111)
CO2: 24 mmol/L (ref 22–32)
CREATININE: 1.07 mg/dL — AB (ref 0.44–1.00)
GFR, EST AFRICAN AMERICAN: 53 mL/min — AB (ref >60)
GFR, EST NON AFRICAN AMERICAN: 46 mL/min — AB (ref >60)
Glucose, Bld: 99 mg/dL (ref 65–99)
Potassium: 4 mmol/L (ref 3.5–5.1)
Sodium: 137 mmol/L (ref 135–145)
Total Bilirubin: 0.7 mg/dL (ref 0.3–1.2)
Total Protein: 7.1 g/dL (ref 6.5–8.1)

## 2016-11-20 MED ORDER — HYDROXYUREA 500 MG PO CAPS: 500.0000 mg | ORAL_CAPSULE | Freq: Every day | ORAL | 3 refills | Status: AC

## 2016-11-20 NOTE — Progress Notes (Signed)
[facility] [place] Note  Patient Care Team: Chipper Herb, MD as PCP - General (Family Medicine) Minus Breeding, MD as Consulting Physician (Cardiology) Clent Jacks, MD as Consulting Physician (Ophthalmology) Patrici Ranks, MD as Consulting Physician (Hematology and Oncology)  CHIEF COMPLAINTS:  JAK 2 positive ET  HISTORY OF PRESENTING ILLNESS:  Kristy [name] 82 y.o. female is here for further follow-up of JAK 2 positive ET. She takes hydrea twice weekly, once on Monday and once on Thursday.  Kristy [name] is accompanied by her sister. I personally reviewed laboratory results with the patient.   She reports having elevated platelets two weeks ago when she had bronchitis. She notes that this is stressful for her when her platelets are not just right. She has a lot of stress at home in regards to her husband. His health has not been well. She denies nausea with her medication. She is compliant. No fever or chills.   MEDICAL HISTORY:  Past Medical History:  Diagnosis Date  . A-fib ([place])   . Cataract   . Closed left arm fracture 2016  . Colon polyps   . Diverticulosis   . Endometrial hyperplasia without atypia, simple   . Endometrial polyp   . Fatty liver   . GERD (gastroesophageal reflux disease)   . Hiatal hernia   . Hyperplastic colon polyp   . Hypertension   . Lipoma   . Obesity   . Optic neuropathy of both eyes 1989, 1997  . SOB (shortness of breath)   . Thrombocytosis ([place])     SURGICAL HISTORY: Past Surgical History:  Procedure Laterality Date  . breast biopsy    . CATARACT EXTRACTION    . CHOLECYSTECTOMY  03/2002  . DILATION AND CURETTAGE OF UTERUS     x 2  . EYE SURGERY    . HERNIA REPAIR    . KNEE SURGERY    . optic neuritis    . umbilical hernia repair  09/2002    SOCIAL HISTORY: Social History   Social History  . Marital status: Married    Spouse name: N/A  . Number of children: N/A  . Years of education:  N/A   Occupational History  . [place] Retired    Retired   Social History Main Topics  . Smoking status: Former Smoker    Quit date: 12/07/1982  . Smokeless tobacco: Never Used  . Alcohol use No  . Drug use: No  . Sexual activity: No   Other Topics Concern  . Not on file   Social History Narrative  . No narrative on file   Married 65 years. Her husband is not well, he has dementia; short-term memory is gone He has just had cancer as well and was treated with radiation by Dr. Isidore Moos He still knows her; he still enjoys eating and they still go out to eat and attend church. They used to ballroom dance and she says "oh, we've had a wonderful life."  She worked up past 78. 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 Laurance Flatten.  She says she puffed smoke but never inhaled; she "puffed" while her friends were playing [place]. She has never had problems with alcohol.  She has two living children, one dead. Her first born was microcephalic. He lived to  be 16. When she heard the news of the [place] virus this year, it brought back all of her sadness about him. She started taking Xanex to alleviate her stress about this matter. Three grandchildren and no great grandchildren.  FAMILY HISTORY: Family History  Problem Relation Age of Onset  . Diabetes Mother   . Diabetes Sister   . Stroke Father   . Diabetes Brother   . Heart disease Brother   . Cancer Sister     lung  . Cancer Sister     vulva  . Cancer Sister   . Dementia Sister   . Osteoporosis Sister   . Diabetes Sister   . Osteoporosis Sister   . Diabetes Sister   . Cancer Brother     bladder  . Colon cancer Neg Hx    indicated that her mother is deceased. She indicated that her father is deceased. She indicated that only one of her seven sisters is alive. She  indicated that all of her three brothers are deceased. She indicated that the status of her neg hx is unknown.     Her mother lived to be 20; she was diabetic. She was very healthy except for diabetes and high blood pressure. She got up one morning, she died suddenly. Father lived to be 70; he was well most of his life until the last 6 months, had a stroke. She is the baby of 13 children. She is one of two that are still living.  Her sister is 95 and still living. 6 in the family have lived into the 35's.  ALLERGIES:  is allergic to codeine and tramadol.  MEDICATIONS:  Current Outpatient Prescriptions  Medication Sig Dispense Refill  . ALPRAZolam (XANAX) 0.25 MG tablet Take 1 tablet (0.25 mg total) by mouth daily as needed for anxiety. 30 tablet 2  . calcium carbonate (OSCAL) 1500 (600 Ca) MG TABS tablet Take 600 mg of elemental calcium by mouth daily.    . Cholecalciferol (VITAMIN D3) 2000 UNITS TABS Take by mouth daily.    Marland Kitchen estradiol (CLIMARA - DOSED IN MG/24 HR) 0.025 mg/24hr patch Place 1 patch (0.025 mg total) onto the skin once a week. 12 patch 3  . hydrochlorothiazide (MICROZIDE) 12.5 MG capsule Take 12.5 mg by mouth daily.    . hydroxyurea (HYDREA) 500 MG capsule TAKE (1) CAPSULE TWICE A WEEK. MAY TAKE WITH FOOD TO MINIMIZE GI EFFECTES 26 capsule 0  . latanoprost (XALATAN) 0.005 % ophthalmic solution Place 1 drop into both eyes daily.    . metoprolol succinate (TOPROL-XL) 25 MG 24 hr tablet TAKE 1 TABLET DAILY 90 tablet 3  . Multiple Vitamin (MULTIVITAMIN) tablet Take 1 tablet by mouth daily.      Marland Kitchen omeprazole (PRILOSEC) 20 MG capsule TAKE (1) CAPSULE DAILY 90 capsule 1  . ONE TOUCH ULTRA TEST test strip USE TO TEST SUGAR TWICE A DAY AS INSTRUCTED 100 each 2  . Polyethyl Glycol-Propyl Glycol (SYSTANE OP) Apply 1-2 drops to eye 4 (four) times daily as needed (dry eyes).    . progesterone (PROMETRIUM) 200 MG capsule 12 tabs every four months 12 capsule 12  . sertraline (ZOLOFT) 50  MG tablet TAKE 1 TABLET DAILY 90 tablet 0  . XARELTO 15 MG TABS tablet TAKE 1 TABLET (15 MG TOTAL) BY MOUTH DAILY. 30 tablet 2   No current facility-administered medications for this visit.     Review of Systems  Constitutional: Negative for chills, fever and weight loss.  HENT:  Negative.  Negative for congestion, hearing loss, nosebleeds, sore throat and tinnitus.   Eyes: Negative.  Negative for blurred vision, double vision, pain and discharge.  Respiratory: Negative.  Negative for cough, hemoptysis, sputum production, shortness of breath and wheezing.   Cardiovascular: Negative.  Negative for chest pain, palpitations, claudication, leg swelling and PND.  Gastrointestinal: Negative for abdominal pain, blood in stool, constipation, heartburn, melena, nausea and vomiting.  Genitourinary: Negative.  Negative for dysuria, frequency, hematuria and urgency.  Musculoskeletal: Negative.  Negative for falls, joint pain and myalgias.  Skin: Negative.  Negative for itching and rash.  Neurological: Negative.  Negative for dizziness, tingling, tremors, sensory change, speech change, focal weakness, seizures, loss of consciousness, weakness and headaches.  Endo/Heme/Allergies: Negative.  Does not bruise/bleed easily.  Psychiatric/Behavioral: Negative.  Negative for depression, memory loss, substance abuse and suicidal ideas. The patient is not nervous/anxious and does not have insomnia.   All other systems reviewed and are negative.  14 point ROS was done and is otherwise as detailed above or in HPI  PHYSICAL EXAMINATION: ECOG PERFORMANCE STATUS: 1 - Symptomatic but completely ambulatory  Vitals:   11/20/16 1351  BP: (!) 162/81  Pulse: 69  Resp: 18  Temp: 98.1 F (36.7 C)   Filed Weights   11/20/16 1351  Weight: 164 lb 4.8 oz (74.5 kg)    Physical Exam  Constitutional: She is oriented to person, place, and time and well-developed, well-nourished, and in no distress.  Appears somewhat  younger than stated age  HENT:  Head: Normocephalic and atraumatic.  Nose: Nose normal.  Mouth/Throat: Oropharynx is clear and moist. No oropharyngeal exudate.  Eyes: Conjunctivae and EOM are normal. Pupils are equal, round, and reactive to light. Right eye exhibits no discharge. Left eye exhibits no discharge. No scleral icterus.  Neck: Normal range of motion. Neck supple. No tracheal deviation present. No thyromegaly present.  Cardiovascular: Normal rate, regular rhythm and normal heart sounds.  Exam reveals no gallop and no friction rub.   No murmur heard. Pulmonary/Chest: Effort normal and breath sounds normal. She has no wheezes. She has no rales.  Abdominal: Soft. Bowel sounds are normal. She exhibits no distension and no mass. There is no tenderness. There is no rebound and no guarding.  Musculoskeletal: Normal range of motion. She exhibits no edema.  Lymphadenopathy:    She has no cervical adenopathy.  Neurological: She is alert and oriented to person, place, and time. She has normal reflexes. No cranial nerve deficit. Gait normal. Coordination normal.  Skin: Skin is warm and dry. No rash noted.  Psychiatric: Mood, memory, affect and judgment normal.  Nursing note and vitals reviewed.   LABORATORY DATA:  I have reviewed the data as listed. Results for CANDELARIA, PIES (MRN [id number]) as of 11/20/2016 13:53  Ref. Range 11/20/2016 12:39  Sodium Latest Ref Range: 135 - 145 mmol/L 137  Potassium Latest Ref Range: 3.5 - 5.1 mmol/L 4.0  Chloride Latest Ref Range: 101 - 111 mmol/L 105  CO2 Latest Ref Range: 22 - 32 mmol/L 24  BUN Latest Ref Range: 6 - 20 mg/dL 20  Creatinine Latest Ref Range: 0.44 - 1.00 mg/dL 1.07 (H)  Calcium Latest Ref Range: 8.9 - 10.3 mg/dL 9.7  EGFR (Non-African Amer.) Latest Ref Range: >60 mL/min 46 (L)  EGFR (African American) Latest Ref Range: >60 mL/min 53 (L)  Glucose Latest Ref Range: 65 - 99 mg/dL 99  Anion gap Latest Ref Range: 5 - 15  8  Alkaline  Phosphatase Latest Ref Range: 38 - 126 U/L 44  Albumin Latest Ref Range: 3.5 - 5.0 g/dL 4.3  AST Latest Ref Range: 15 - 41 U/L 20  ALT Latest Ref Range: 14 - 54 U/L 12 (L)  Total Protein Latest Ref Range: 6.5 - 8.1 g/dL 7.1  Total Bilirubin Latest Ref Range: 0.3 - 1.2 mg/dL 0.7  WBC Latest Ref Range: 4.0 - 10.5 K/uL 8.3  RBC Latest Ref Range: 3.87 - 5.11 MIL/uL 4.07  Hemoglobin Latest Ref Range: 12.0 - 15.0 g/dL 12.8  HCT Latest Ref Range: 36.0 - 46.0 % 38.4  MCV Latest Ref Range: 78.0 - 100.0 fL 94.3  MCH Latest Ref Range: 26.0 - 34.0 pg 31.4  MCHC Latest Ref Range: 30.0 - 36.0 g/dL 33.3  RDW Latest Ref Range: 11.5 - 15.5 % 14.2  Platelets Latest Ref Range: 150 - 400 K/uL 387  Neutrophils Latest Units: % 71  Lymphocytes Latest Units: % 22  Monocytes Relative Latest Units: % 6  Eosinophil Latest Units: % 1  Basophil Latest Units: % 0  NEUT# Latest Ref Range: 1.7 - 7.7 K/uL 5.9  Lymphocyte # Latest Ref Range: 0.7 - 4.0 K/uL 1.8  Monocyte # Latest Ref Range: 0.1 - 1.0 K/uL 0.5  Eosinophils Absolute Latest Ref Range: 0.0 - 0.7 K/uL 0.0  Basophils Absolute Latest Ref Range: 0.0 - 0.1 K/uL 0.0   ASSESSMENT & PLAN:  JAK 2 positive ET Hydrea  Patient is pleasant. She was very emotional during our visit. I assured her that her blood work looks fantastic today- Platelets 387; hemoglobin 12.8. I personally reviewed and went over laboratory results with the patient.  The results are noted within this dictation.  I explained her platelets were likely elevated two weeks ago because she had bronchitis. Emika was told to continue Hydrea twice a week (Monday and Thursday).  She will RTC in four months.   Orders Placed This Encounter  Procedures  . CBC with Differential    Standing Status:   Future    Standing Expiration Date:   11/20/2017  . Comprehensive metabolic panel    Standing Status:   Future    Standing Expiration Date:   11/20/2017    All questions were answered. The patient knows  to call the clinic with any problems, questions or concerns..  This document serves as a record of services personally performed by Ancil Linsey, MD. It was created on her behalf by Elmyra Ricks, a trained medical scribe. The creation of this record is based on the scribe's personal observations and the provider's statements to them. This document has been checked and approved by the attending provider.  I have reviewed the above documentation for accuracy and completeness, and I agree with the above.  This note was electronically signed.    Patrici Ranks, MD.   11/20/2016 1:53 PM

## 2016-11-20 NOTE — Patient Instructions (Addendum)
[place] at [facility] Discharge Instructions  RECOMMENDATIONS MADE BY THE CONSULTANT AND ANY TEST RESULTS WILL BE SENT TO YOUR REFERRING PHYSICIAN.  You saw Dr.Penland today.  Follow up in 4 months with labs.  Only take the Hydrea on Monday and Thursday!  See Amy at checkout for appointments.  Thank you for choosing [place] at [facility] to provide your oncology and hematology care.  To afford each patient quality time with our provider, please arrive at least 15 minutes before your scheduled appointment time.   Beginning January 23rd 2017 lab work for the [facility] will be done in the  Main lab at [facility] on 1st floor. If you have a lab appointment with the [facility] please come in thru the  Main Entrance and check in at the main information desk  You need to re-schedule your appointment should you arrive 10 or more minutes late.  We strive to give you quality time with our providers, and arriving late affects you and other patients whose appointments are after yours.  Also, if you no show three or more times for appointments you may be dismissed from the clinic at the providers discretion.     Again, thank you for choosing [facility].  Our hope is that these requests will decrease the amount of time that you wait before being seen by our physicians.       _____________________________________________________________  Should you have questions after your visit to [facility], please contact our office at [phone] between the hours of 8:30 a.m. and 4:30 p.m.  Voicemails left after 4:30 p.m. will not be returned until the following business day.  For prescription refill requests, have your pharmacy contact our office.         Resources For Cancer Patients and their Caregivers ? American Cancer Society: Can assist with transportation, wigs, general needs, runs Look Good Feel Better.         340-417-9158 ? Cancer Care: Provides financial assistance, online support groups, medication/co-pay assistance.  1-800-813-HOPE (205)287-0350) ? [place] Assists [place] cancer patients and their families through emotional , educational and financial support.  (708) 047-9655 ? [place] DSS Where to apply for food stamps, Medicaid and utility assistance. (682)525-2850 ? [facility]: Transportation to medical appointments. 512 074 6003 ? Social Security Administration: May apply for disability if have a Stage IV cancer. 7193117290 7627824862 ? [employer], Disability and Transit Services: Assists with nutrition, care and transit needs. [place] Support Programs: @10RELATIVEDAYS @ > Cancer Support Group  2nd Tuesday of the month 1pm-2pm, Journey Room  > Creative Journey  3rd Tuesday of the month 1130am-1pm, Journey Room  > Look Good Feel Better  1st Wednesday of the month 10am-12 noon, Journey Room (Call [place] to register [phone])

## 2016-12-04 ENCOUNTER — Encounter (HOSPITAL_COMMUNITY): Payer: Self-pay | Admitting: Hematology & Oncology

## 2017-01-08 DIAGNOSIS — H353131 Nonexudative age-related macular degeneration, bilateral, early dry stage: Secondary | ICD-10-CM | POA: Diagnosis not present

## 2017-01-08 DIAGNOSIS — H47013 Ischemic optic neuropathy, bilateral: Secondary | ICD-10-CM | POA: Diagnosis not present

## 2017-01-08 DIAGNOSIS — Z961 Presence of intraocular lens: Secondary | ICD-10-CM | POA: Diagnosis not present

## 2017-01-08 DIAGNOSIS — H401233 Low-tension glaucoma, bilateral, severe stage: Secondary | ICD-10-CM | POA: Diagnosis not present

## 2017-01-15 ENCOUNTER — Other Ambulatory Visit: Payer: Self-pay | Admitting: *Deleted

## 2017-01-15 MED ORDER — SERTRALINE HCL 50 MG PO TABS: 75.0000 mg | ORAL_TABLET | Freq: Every day | ORAL | 1 refills | Status: DC

## 2017-01-28 ENCOUNTER — Other Ambulatory Visit: Payer: Self-pay | Admitting: *Deleted

## 2017-01-28 MED ORDER — GLUCOSE BLOOD VI STRP: ORAL_STRIP | 2 refills | Status: DC

## 2017-02-04 ENCOUNTER — Encounter: Payer: Self-pay | Admitting: Family Medicine

## 2017-02-04 ENCOUNTER — Ambulatory Visit (INDEPENDENT_AMBULATORY_CARE_PROVIDER_SITE_OTHER): Payer: Medicare Other | Admitting: Family Medicine

## 2017-02-04 VITALS — BP 132/70 | HR 80 | Temp 97.8°F | Ht 62.0 in | Wt 160.5 lb

## 2017-02-04 DIAGNOSIS — L918 Other hypertrophic disorders of the skin: Secondary | ICD-10-CM

## 2017-02-04 NOTE — Progress Notes (Signed)
BP 132/70   Pulse 80   Temp 97.8 F (36.6 C) (Oral)   Ht 5\' 2"  (1.575 m)   Wt 160 lb 8 oz (72.8 kg)   BMI 29.36 kg/m    Subjective:    Patient ID: Kristy [name], female    DOB: 01-Oct-1928, 83 y.o.   MRN: [id number]  HPI: Kristy [name] is a 83 y.o. female presenting on 02/04/2017 for Wart on upper thigh   HPI Skin lesion on left inner thigh Patient has a skin lesion on left inner upper thigh that has been there for at least a few months and most recently became inflamed and irritated. She tried to use over-the-counter wart treatment for it and it just has more irritation and did not seem to help. She denies any fevers or chills or redness or drainage out of the site. It is irritated around the skin because she is one of the home remedies that burned her skin.  Relevant past medical, surgical, family and social history reviewed and updated as indicated. Interim medical history since our last visit reviewed. Allergies and medications reviewed and updated.  Review of Systems  Constitutional: Negative for chills and fever.  Respiratory: Negative for chest tightness and shortness of breath.   Cardiovascular: Negative for chest pain and leg swelling.  Genitourinary: Negative for difficulty urinating and dysuria.  Musculoskeletal: Negative for back pain and gait problem.  Skin: Negative for rash.  Neurological: Negative for light-headedness and headaches.  Psychiatric/Behavioral: Negative for agitation and behavioral problems.  All other systems reviewed and are negative.   Per HPI unless specifically indicated above       Objective:    BP 132/70   Pulse 80   Temp 97.8 F (36.6 C) (Oral)   Ht 5\' 2"  (1.575 m)   Wt 160 lb 8 oz (72.8 kg)   BMI 29.36 kg/m   Wt Readings from Last 3 Encounters:  02/04/17 160 lb 8 oz (72.8 kg)  11/20/16 164 lb 4.8 oz (74.5 kg)  11/06/16 166 lb (75.3 kg)    Physical Exam  Constitutional: She is oriented to person, place, and time. She appears  well-developed and well-nourished. No distress.  Eyes: Conjunctivae are normal.  Neurological: She is alert and oriented to person, place, and time. Coordination normal.  Skin: Skin is warm and dry. Lesion (Inflamed and irritated skin tag that is very small on left inner upper thigh) noted. No rash noted. She is not diaphoretic.  Psychiatric: She has a normal mood and affect. Her behavior is normal.  Nursing note and vitals reviewed.   Skin tag removal: Patient had an inflamed and irritated skin tag on left inner thigh, removed with simple iris scissors. Because she is on a blood thinner she was having some bleeding and use a letter cauterization at 30 A to cauterize and placed a pressure dressing on top afterwards. Bleeding was minimal and hemostasis was achieved prior to bandage application.    Assessment & Plan:   Problem List Items Addressed This Visit    None    Visit Diagnoses    Inflamed skin tag    -  Primary   Left inner thigh, removed without issue       Follow up plan: Return if symptoms worsen or fail to improve.  Counseling provided for all of the vaccine components No orders of the defined types were placed in this encounter.   Caryl Pina, MD [facility] 02/04/2017, 3:36 PM

## 2017-02-24 ENCOUNTER — Ambulatory Visit: Payer: Medicare Other | Admitting: Cardiology

## 2017-02-24 ENCOUNTER — Ambulatory Visit: Payer: Medicare Other | Admitting: Family Medicine

## 2017-03-09 ENCOUNTER — Encounter: Payer: Self-pay | Admitting: Cardiology

## 2017-03-11 ENCOUNTER — Other Ambulatory Visit: Payer: Self-pay

## 2017-03-11 MED ORDER — DICLOFENAC SODIUM 1 % TD GEL
4.0000 g | Freq: Four times a day (QID) | TRANSDERMAL | 0 refills | Status: DC
Start: 2017-03-11 — End: 2017-09-02

## 2017-03-17 ENCOUNTER — Encounter: Payer: Self-pay | Admitting: Family Medicine

## 2017-03-17 ENCOUNTER — Ambulatory Visit (INDEPENDENT_AMBULATORY_CARE_PROVIDER_SITE_OTHER): Payer: Medicare Other | Admitting: Family Medicine

## 2017-03-17 VITALS — BP 134/58 | HR 67 | Temp 96.9°F | Ht 62.0 in | Wt 159.0 lb

## 2017-03-17 DIAGNOSIS — M1710 Unilateral primary osteoarthritis, unspecified knee: Secondary | ICD-10-CM | POA: Diagnosis not present

## 2017-03-17 MED ORDER — PREDNISONE 10 MG PO TABS: ORAL_TABLET | ORAL | 0 refills | Status: DC

## 2017-03-17 MED ORDER — CELECOXIB 200 MG PO CAPS: 200.0000 mg | ORAL_CAPSULE | Freq: Every day | ORAL | 5 refills | Status: DC

## 2017-03-17 MED ORDER — HYDROXYUREA 500 MG PO CAPS: ORAL_CAPSULE | ORAL | Status: DC

## 2017-03-17 NOTE — Progress Notes (Signed)
Subjective:  Patient ID: Kristy [name], female    DOB: May 12, 1928  Age: 83 y.o. MRN: [id number]  CC: Knee Pain (right knee pain)   HPI Kristy [name] presents for pain at the entire right knee joint. Not localized. Moderately severe. NKI. States using xarelto for A fib. Avoiding NSAIDS.    History Bobi has a past medical history of A-fib ([place]); Cataract; Closed left arm fracture (2016); Colon polyps; Diverticulosis; Endometrial hyperplasia without atypia, simple; Endometrial polyp; Fatty liver; GERD (gastroesophageal reflux disease); Hiatal hernia; Hyperplastic colon polyp; Hypertension; Lipoma; Obesity; Optic neuropathy of both eyes (1989, 1997); SOB (shortness of breath); and Thrombocytosis ([place]).   She has a past surgical history that includes Cholecystectomy (02/91); umbilical hernia repair (09/2002); Knee surgery; optic neuritis; Dilation and curettage of uterus; Cataract extraction; breast biopsy; Hernia repair; and Eye surgery.   Her family history includes Cancer in her brother, sister, sister, and sister; Dementia in her sister; Diabetes in her brother, mother, sister, sister, and sister; Heart disease in her brother; Osteoporosis in her sister and sister; Stroke in her father.She reports that she quit smoking about 34 years ago. She has never used smokeless tobacco. She reports that she does not drink alcohol or use drugs.    ROS Review of Systems  Constitutional: Negative for fever.  Respiratory: Negative for shortness of breath.   Cardiovascular: Negative for chest pain.  Gastrointestinal: Negative for abdominal pain.  Musculoskeletal: Positive for arthralgias (see HPI).    Objective:  BP (!) 134/58   Pulse 67   Temp (!) 96.9 F (36.1 C) (Oral)   Ht 5\' 2"  (1.575 m)   Wt 159 lb (72.1 kg)   BMI 29.08 kg/m   BP Readings from Last 3 Encounters:  03/17/17 (!) 134/58  02/04/17 132/70  11/20/16 (!) 162/81    Wt Readings from Last 3 Encounters:  03/17/17 159 lb (72.1  kg)  02/04/17 160 lb 8 oz (72.8 kg)  11/20/16 164 lb 4.8 oz (74.5 kg)     Physical Exam  Musculoskeletal:  Theright knee has decreased range of motion with discomfort actively and passively. Gait is painful & slow. The joint lines are tender. The patella is palpable without  edema. Crepitus noted Lachman / anterior drawer signs are negative for signs of instability and pain free. McMurray testing reveals no pop or excessive discomfort. Varus and valgus stress maneuvers do not cause ligamentous stretch or instability.       Assessment & Plan:   Kodie was seen today for knee pain.  Diagnoses and all orders for this visit:  Arthritis of knee  Other orders -     predniSONE (DELTASONE) 10 MG tablet; Take 5 daily for 2 days followed by 4,3,2 and 1 for 2 days each. -     celecoxib (CELEBREX) 200 MG capsule; Take 1 capsule (200 mg total) by mouth daily. With food for joint pain -     hydroxyurea (HYDREA) 500 MG capsule; On Monday & Thursday       I am having Ms. Pla start on predniSONE, celecoxib, and hydroxyurea. I am also having her maintain her multivitamin, Vitamin D3, Polyethyl Glycol-Propyl Glycol (SYSTANE OP), ALPRAZolam, progesterone, estradiol, latanoprost, hydrochlorothiazide, metoprolol succinate, omeprazole, XARELTO, sertraline, glucose blood, and diclofenac sodium.  Allergies as of 03/17/2017      Reactions   Codeine Nausea Only   Tramadol Nausea Only      Medication List       Accurate as of 03/17/17 11:59  PM. Always use your most recent med list.          ALPRAZolam 0.25 MG tablet Commonly known as:  XANAX Take 1 tablet (0.25 mg total) by mouth daily as needed for anxiety.   celecoxib 200 MG capsule Commonly known as:  CELEBREX Take 1 capsule (200 mg total) by mouth daily. With food for joint pain   diclofenac sodium 1 % Gel Commonly known as:  VOLTAREN Apply 4 g topically 4 (four) times daily.   estradiol 0.025 mg/24hr patch Commonly known as:   CLIMARA - Dosed in mg/24 hr Place 1 patch (0.025 mg total) onto the skin once a week.   glucose blood test strip Commonly known as:  ONE TOUCH ULTRA TEST Test once qd. DX E11.9   hydrochlorothiazide 12.5 MG capsule Commonly known as:  MICROZIDE Take 12.5 mg by mouth daily.   hydroxyurea 500 MG capsule Commonly known as:  HYDREA On Monday & Thursday   latanoprost 0.005 % ophthalmic solution Commonly known as:  XALATAN Place 1 drop into both eyes daily.   metoprolol succinate 25 MG 24 hr tablet Commonly known as:  TOPROL-XL TAKE 1 TABLET DAILY   multivitamin tablet Take 1 tablet by mouth daily.   omeprazole 20 MG capsule Commonly known as:  PRILOSEC TAKE (1) CAPSULE DAILY   predniSONE 10 MG tablet Commonly known as:  DELTASONE Take 5 daily for 2 days followed by 4,3,2 and 1 for 2 days each.   progesterone 200 MG capsule Commonly known as:  PROMETRIUM 12 tabs every four months   sertraline 50 MG tablet Commonly known as:  ZOLOFT Take 1.5 tablets (75 mg total) by mouth daily.   SYSTANE OP Apply 1-2 drops to eye 4 (four) times daily as needed (dry eyes).   Vitamin D3 2000 units Tabs Take by mouth daily.   XARELTO 15 MG Tabs tablet Generic drug:  Rivaroxaban TAKE 1 TABLET (15 MG TOTAL) BY MOUTH DAILY.        Follow-up: Return if symptoms worsen or fail to improve.  Claretta Fraise, M.D.

## 2017-03-21 NOTE — Progress Notes (Signed)
HPI The patient presents for follow up of atrial fib.   She had been managed with Xarelto.  She did have some pulmonary HTN and TR on echo.  However, follow up echo in Feb of last year demonstrated only mod TR and no significant pulmonary HTN.  Since I last saw her her husband died.  She has been quite distraught.   She is however not complaining of any new shortness of breath and isn't having the dyspnea is much as she was alert she's not doing as much now that she does not care for her husband. She is denying any chest pressure, neck or arm discomfort. Said no palpitations, presyncope or syncope. He was having dizziness before but this isn't a problem. She has been very limited by right knee is getting this treated.   Allergies  Allergen Reactions  . Codeine Nausea Only  . Tramadol Nausea Only    Current Outpatient Prescriptions  Medication Sig Dispense Refill  . ALPRAZolam (XANAX) 0.25 MG tablet Take 1 tablet (0.25 mg total) by mouth daily as needed for anxiety. 30 tablet 2  . celecoxib (CELEBREX) 200 MG capsule Take 1 capsule (200 mg total) by mouth daily. With food for joint pain 30 capsule 5  . Cholecalciferol (VITAMIN D3) 2000 UNITS TABS Take by mouth daily.    Marland Kitchen estradiol (CLIMARA - DOSED IN MG/24 HR) 0.025 mg/24hr patch Place 1 patch (0.025 mg total) onto the skin once a week. 12 patch 3  . hydrochlorothiazide (MICROZIDE) 12.5 MG capsule Take 12.5 mg by mouth daily.    . hydroxyurea (HYDREA) 500 MG capsule On Monday & Thursday    . metoprolol succinate (TOPROL-XL) 25 MG 24 hr tablet Take 25 mg by mouth daily.    . Multiple Vitamin (MULTIVITAMIN) tablet Take 1 tablet by mouth daily.      Marland Kitchen omeprazole (PRILOSEC) 20 MG capsule Take 20 mg by mouth daily.    . predniSONE (DELTASONE) 10 MG tablet Take 5 daily for 2 days followed by 4,3,2 and 1 for 2 days each. 30 tablet 0  . progesterone (PROMETRIUM) 200 MG capsule 12 tabs every four months 12 capsule 12  . sertraline (ZOLOFT) 50 MG  tablet Take 1.5 tablets (75 mg total) by mouth daily. 135 tablet 1  . XARELTO 15 MG TABS tablet TAKE 1 TABLET (15 MG TOTAL) BY MOUTH DAILY. 30 tablet 2  . diclofenac sodium (VOLTAREN) 1 % GEL Apply 4 g topically 4 (four) times daily. (Patient not taking: Reported on 03/24/2017) 100 g 0  . glucose blood (ONE TOUCH ULTRA TEST) test strip Test once qd. DX E11.9 100 each 2  . latanoprost (XALATAN) 0.005 % ophthalmic solution Place 1 drop into both eyes daily.    Vladimir Faster Glycol-Propyl Glycol (SYSTANE OP) Apply 1-2 drops to eye 4 (four) times daily as needed (dry eyes).     No current facility-administered medications for this visit.     Past Medical History:  Diagnosis Date  . A-fib ([place])   . Cataract   . Closed left arm fracture 2016  . Colon polyps   . Diverticulosis   . Endometrial hyperplasia without atypia, simple   . Endometrial polyp   . Fatty liver   . GERD (gastroesophageal reflux disease)   . Hiatal hernia   . Hyperplastic colon polyp   . Hypertension   . Lipoma   . Obesity   . Optic neuropathy of both eyes 1989, 1997  . SOB (shortness of breath)   .  Thrombocytosis ([place])     Past Surgical History:  Procedure Laterality Date  . breast biopsy    . CATARACT EXTRACTION    . CHOLECYSTECTOMY  03/2002  . DILATION AND CURETTAGE OF UTERUS     x 2  . EYE SURGERY    . HERNIA REPAIR    . KNEE SURGERY    . optic neuritis    . umbilical hernia repair  09/2002    ROS:    As stated in the HPI and negative for all other systems.  PHYSICAL EXAM BP (!) 160/82   Pulse 64   Ht 5\' 2"  (1.575 m)   Wt 158 lb (71.7 kg)   BMI 28.90 kg/m  GENERAL:  Well appearing and in no distress NECK:  No jugular venous distention, waveform within normal limits, carotid upstroke brisk and symmetric, no bruits, no thyromegaly LUNGS:  Clear to auscultation bilaterally CHEST:  Unremarkable HEART:  PMI not displaced or sustained,S1 and S2 within normal limits, no S3, no S4, no clicks, no rubs, no  murmurs ABD:  Flat, positive bowel sounds normal in frequency in pitch, no bruits, no rebound, no guarding, no midline pulsatile mass, no hepatomegaly, no splenomegaly EXT:  2 plus pulses throughout, no edema, no cyanosis no clubbing PSYCH:  Tearful  EKG:  Sinus rhythm, rate 64 left axis deviation, left anterior fascicular block, poor anterior R wave progression, no acute ST-T wave changes.  03/24/2017   ASSESSMENT AND PLAN  ATRIAL FIB:  She has had no symptomatic recurrence of this.  Ms. MINDEL FRISCIA has a CHA2DS2 - VASc score of 5 with a risk of stroke of 6.7% .   She is doing well with anticoagulation and does not seem to have any symptomatic recurrence.   No change in therapy is planned  HTN:  The blood pressure is elevated.  However, this is unusual.  She is going to get a new BP cuff and keep a diary and share this with me.   TR:  I don't hear this and there are no physical findings.  No further imaging is indicated.   DYSPNEA:   This is not a significant issue although she is not doing as much currently.   PRESYNCOPE:  Carotid Dopplers showed only mild plaque.  She has had no further symptoms.  No change is indicated. . No further testing.

## 2017-03-24 ENCOUNTER — Ambulatory Visit (INDEPENDENT_AMBULATORY_CARE_PROVIDER_SITE_OTHER): Payer: Medicare Other | Admitting: Cardiology

## 2017-03-24 ENCOUNTER — Encounter: Payer: Self-pay | Admitting: Cardiology

## 2017-03-24 VITALS — BP 160/82 | HR 64 | Ht 62.0 in | Wt 158.0 lb

## 2017-03-24 DIAGNOSIS — I10 Essential (primary) hypertension: Secondary | ICD-10-CM

## 2017-03-24 DIAGNOSIS — I480 Paroxysmal atrial fibrillation: Secondary | ICD-10-CM | POA: Diagnosis not present

## 2017-03-24 DIAGNOSIS — E785 Hyperlipidemia, unspecified: Secondary | ICD-10-CM

## 2017-03-24 DIAGNOSIS — I361 Nonrheumatic tricuspid (valve) insufficiency: Secondary | ICD-10-CM

## 2017-03-24 NOTE — Patient Instructions (Signed)
Medication Instructions:  The current medical regimen is effective;  continue present plan and medications.  Follow-Up: Follow up in 1 year with Dr. Percival Spanish in [place].  You will receive a letter in the mail 2 months before you are due.  Please call us when you receive this letter to schedule your follow up appointment.  If you need a refill on your cardiac medications before your next appointment, please call your pharmacy.  Thank you for choosing [facility]!!    [place]

## 2017-03-26 ENCOUNTER — Encounter (HOSPITAL_COMMUNITY): Payer: Self-pay | Admitting: Adult Health

## 2017-03-26 ENCOUNTER — Encounter (HOSPITAL_COMMUNITY): Payer: Medicare Other

## 2017-03-26 ENCOUNTER — Encounter (HOSPITAL_COMMUNITY): Payer: Medicare Other | Attending: Hematology | Admitting: Adult Health

## 2017-03-26 VITALS — BP 125/94 | HR 57 | Temp 97.4°F | Resp 18 | Ht 62.0 in | Wt 157.0 lb

## 2017-03-26 DIAGNOSIS — E669 Obesity, unspecified: Secondary | ICD-10-CM | POA: Diagnosis not present

## 2017-03-26 DIAGNOSIS — I4891 Unspecified atrial fibrillation: Secondary | ICD-10-CM | POA: Insufficient documentation

## 2017-03-26 DIAGNOSIS — K449 Diaphragmatic hernia without obstruction or gangrene: Secondary | ICD-10-CM | POA: Diagnosis not present

## 2017-03-26 DIAGNOSIS — H469 Unspecified optic neuritis: Secondary | ICD-10-CM | POA: Insufficient documentation

## 2017-03-26 DIAGNOSIS — I10 Essential (primary) hypertension: Secondary | ICD-10-CM | POA: Diagnosis not present

## 2017-03-26 DIAGNOSIS — Z87891 Personal history of nicotine dependence: Secondary | ICD-10-CM | POA: Insufficient documentation

## 2017-03-26 DIAGNOSIS — D473 Essential (hemorrhagic) thrombocythemia: Secondary | ICD-10-CM | POA: Insufficient documentation

## 2017-03-26 DIAGNOSIS — H269 Unspecified cataract: Secondary | ICD-10-CM | POA: Insufficient documentation

## 2017-03-26 DIAGNOSIS — D72829 Elevated white blood cell count, unspecified: Secondary | ICD-10-CM | POA: Insufficient documentation

## 2017-03-26 DIAGNOSIS — Z86010 Personal history of colonic polyps: Secondary | ICD-10-CM | POA: Insufficient documentation

## 2017-03-26 DIAGNOSIS — N840 Polyp of corpus uteri: Secondary | ICD-10-CM | POA: Insufficient documentation

## 2017-03-26 DIAGNOSIS — Z1589 Genetic susceptibility to other disease: Secondary | ICD-10-CM

## 2017-03-26 DIAGNOSIS — K219 Gastro-esophageal reflux disease without esophagitis: Secondary | ICD-10-CM | POA: Insufficient documentation

## 2017-03-26 LAB — COMPREHENSIVE METABOLIC PANEL
ALT: 21 U/L (ref 14–54)
AST: 22 U/L (ref 15–41)
Albumin: 4.2 g/dL (ref 3.5–5.0)
Alkaline Phosphatase: 59 U/L (ref 38–126)
Anion gap: 9 (ref 5–15)
BILIRUBIN TOTAL: 0.5 mg/dL (ref 0.3–1.2)
BUN: 36 mg/dL — AB (ref 6–20)
CALCIUM: 9.6 mg/dL (ref 8.9–10.3)
CO2: 28 mmol/L (ref 22–32)
CREATININE: 1.51 mg/dL — AB (ref 0.44–1.00)
Chloride: 104 mmol/L (ref 101–111)
GFR calc Af Amer: 35 mL/min — ABNORMAL LOW (ref >60)
GFR, EST NON AFRICAN AMERICAN: 30 mL/min — AB (ref >60)
Glucose, Bld: 160 mg/dL — ABNORMAL HIGH (ref 65–99)
Potassium: 5 mmol/L (ref 3.5–5.1)
Sodium: 141 mmol/L (ref 135–145)
TOTAL PROTEIN: 6.5 g/dL (ref 6.5–8.1)

## 2017-03-26 LAB — CBC WITH DIFFERENTIAL/PLATELET
BASOS ABS: 0 10*3/uL (ref 0.0–0.1)
Basophils Relative: 0 %
EOS PCT: 1 %
Eosinophils Absolute: 0.1 10*3/uL (ref 0.0–0.7)
HEMATOCRIT: 41.3 % (ref 36.0–46.0)
Hemoglobin: 14 g/dL (ref 12.0–15.0)
LYMPHS ABS: 1.2 10*3/uL (ref 0.7–4.0)
LYMPHS PCT: 10 %
MCH: 32 pg (ref 26.0–34.0)
MCHC: 33.9 g/dL (ref 30.0–36.0)
MCV: 94.5 fL (ref 78.0–100.0)
Monocytes Absolute: 0.4 10*3/uL (ref 0.1–1.0)
Monocytes Relative: 4 %
NEUTROS ABS: 9.7 10*3/uL — AB (ref 1.7–7.7)
Neutrophils Relative %: 85 %
PLATELETS: 438 10*3/uL — AB (ref 150–400)
RBC: 4.37 MIL/uL (ref 3.87–5.11)
RDW: 14.2 % (ref 11.5–15.5)
WBC: 11.4 10*3/uL — ABNORMAL HIGH (ref 4.0–10.5)

## 2017-03-26 NOTE — Patient Instructions (Addendum)
[facility] Discharge Instructions  RECOMMENDATIONS MADE BY THE CONSULTANT AND ANY TEST RESULTS WILL BE SENT TO YOUR REFERRING PHYSICIAN.  You were seen today by Gretchen Dawson NP. Return in 4 months for follow up and labs.  Thank you for choosing [facility] at [facility] to provide your oncology and hematology care.  To afford each patient quality time with our provider, please arrive at least 15 minutes before your scheduled appointment time.    If you have a lab appointment with the Cancer Center please come in thru the  Main Entrance and check in at the main information desk  You need to re-schedule your appointment should you arrive 10 or more minutes late.  We strive to give you quality time with our providers, and arriving late affects you and other patients whose appointments are after yours.  Also, if you no show three or more times for appointments you may be dismissed from the clinic at the providers discretion.     Again, thank you for choosing [facility].  Our hope is that these requests will decrease the amount of time that you wait before being seen by our physicians.       _____________________________________________________________  Should you have questions after your visit to [facility], please contact our office at [phone] between the hours of 8:30 a.m. and 4:30 p.m.  Voicemails left after 4:30 p.m. will not be returned until the following business day.  For prescription refill requests, have your pharmacy contact our office.       Resources For Cancer Patients and their Caregivers ? American Cancer Society: Can assist with transportation, wigs, general needs, runs Look Good Feel Better.        1-888-227-6333 ? Cancer Care: Provides financial assistance, online support groups, medication/co-pay assistance.  1-800-813-HOPE (4673) ? [facility] Assists [place] cancer patients and their families through emotional , educational and financial support.  336-427-4357 ? [place] DSS Where to apply for food stamps, Medicaid and utility assistance. 336-342-1394 ? [facility]: Transportation to medical appointments. 336-347-2287 ? Social Security Administration: May apply for disability if have a Stage IV cancer. 336-342-7796 1-800-772-1213 ? [place] Aging, Disability and Transit Services: Assists with nutrition, care and transit needs. 336-349-2343  Cancer Center Support Programs: [email or web address]@ > Cancer Support Group  2nd Tuesday of the month 1pm-2pm, Journey Room  > Creative Journey  3rd Tuesday of the month 1130am-1pm, Journey Room  > Look Good Feel Better  1st Wednesday of the month 10am-12 noon, Journey Room (Call American Cancer Society to register 1-800-395-5775)    

## 2017-03-26 NOTE — Progress Notes (Signed)
[address], [place] [address]   CLINIC:  Medical Oncology/Hematology  PCP:  Redge Gainer, [address] [place] [address] [phone]   REASON FOR VISIT:  Follow-up for Essential thrombocytosis, JAK2+  CURRENT THERAPY: Hydrea 500 mg twice per week (on Mondays & Thursdays)     INTERVAL HISTORY:  [name] 83 y.o. female presents for continued follow-up for JAK2+ thrombocytosis.   She is here today with her niece. She continues on Hydrea 500 mg on Mondays and Thursdays.  Continues to tolerate Hydrea well with largely no side effects.  She wants to know how much longer she will need to take the Hydrea. Overall, she feels pretty well.  Appetite 100%; energy levels are 50% of baseline.    Recently, she was given prednisone by her PCP for worsening arthralgias. She finished the prednisone earlier this week.  Denies any pain at present.    She shared with me that her husband of 65+ years died suddenly on 12/19/2016. She is continuing to grieve and has excellent support from her family and friends.     REVIEW OF SYSTEMS:  Review of Systems  Constitutional: Negative.  Negative for chills, fatigue and fever.  HENT:  Negative.  Negative for lump/mass and nosebleeds.   Eyes: Negative.   Respiratory: Negative.  Negative for cough and shortness of breath.   Cardiovascular: Negative.  Negative for chest pain and leg swelling.  Gastrointestinal: Negative.  Negative for abdominal pain, blood in stool, constipation, diarrhea, nausea and vomiting.  Endocrine: Negative.   Genitourinary: Negative.  Negative for dysuria and hematuria.   Musculoskeletal: Negative.  Negative for arthralgias (improved with recent prednisone).  Skin: Negative.  Negative for rash.  Neurological: Negative.  Negative for dizziness and headaches.  Hematological: Negative.  Negative for adenopathy. Does not bruise/bleed easily.     PAST MEDICAL/SURGICAL HISTORY:  Past  Medical History:  Diagnosis Date  . A-fib ([place])   . Cataract   . Closed left arm fracture 2016  . Colon polyps   . Diverticulosis   . Endometrial hyperplasia without atypia, simple   . Endometrial polyp   . Fatty liver   . GERD (gastroesophageal reflux disease)   . Hiatal hernia   . Hyperplastic colon polyp   . Hypertension   . Lipoma   . Obesity   . Optic neuropathy of both eyes 1989, 1997  . SOB (shortness of breath)   . Thrombocytosis ([place])    Past Surgical History:  Procedure Laterality Date  . breast biopsy    . CATARACT EXTRACTION    . CHOLECYSTECTOMY  03/2002  . DILATION AND CURETTAGE OF UTERUS     x 2  . EYE SURGERY    . HERNIA REPAIR    . KNEE SURGERY    . optic neuritis    . umbilical hernia repair  09/2002     SOCIAL HISTORY:  Social History   Social History  . Marital status: Married    Spouse name: N/A  . Number of children: N/A  . Years of education: N/A   Occupational History  . [employer] Retired    Retired   Social History Main Topics  . Smoking status: Former Smoker    Quit date: 12/07/1982  . Smokeless tobacco: Never Used  . Alcohol use No  . Drug use: No  . Sexual activity: No   Other Topics Concern  . Not on file  Social History Narrative  . No narrative on file    FAMILY HISTORY:  Family History  Problem Relation Age of Onset  . Diabetes Mother   . Diabetes Sister   . Stroke Father   . Diabetes Brother   . Heart disease Brother   . Cancer Sister     lung  . Cancer Sister     vulva  . Cancer Sister   . Dementia Sister   . Osteoporosis Sister   . Diabetes Sister   . Osteoporosis Sister   . Diabetes Sister   . Cancer Brother     bladder  . Colon cancer Neg Hx     CURRENT MEDICATIONS:  Outpatient Encounter Prescriptions as of 03/26/2017  Medication Sig  . ALPRAZolam (XANAX) 0.25 MG tablet Take 1 tablet (0.25 mg total) by mouth daily as needed for anxiety.  . celecoxib (CELEBREX) 200 MG  capsule Take 1 capsule (200 mg total) by mouth daily. With food for joint pain  . Cholecalciferol (VITAMIN D3) 2000 UNITS TABS Take by mouth daily.  . diclofenac sodium (VOLTAREN) 1 % GEL Apply 4 g topically 4 (four) times daily.  Marland Kitchen estradiol (CLIMARA - DOSED IN MG/24 HR) 0.025 mg/24hr patch Place 1 patch (0.025 mg total) onto the skin once a week.  Marland Kitchen glucose blood (ONE TOUCH ULTRA TEST) test strip Test once qd. DX E11.9  . hydrochlorothiazide (MICROZIDE) 12.5 MG capsule Take 12.5 mg by mouth daily.  . hydroxyurea (HYDREA) 500 MG capsule On Monday & Thursday  . latanoprost (XALATAN) 0.005 % ophthalmic solution Place 1 drop into both eyes daily.  . metoprolol succinate (TOPROL-XL) 25 MG 24 hr tablet Take 25 mg by mouth daily.  . Multiple Vitamin (MULTIVITAMIN) tablet Take 1 tablet by mouth daily.    Marland Kitchen omeprazole (PRILOSEC) 20 MG capsule Take 20 mg by mouth daily.  Vladimir Faster Glycol-Propyl Glycol (SYSTANE OP) Apply 1-2 drops to eye 4 (four) times daily as needed (dry eyes).  . predniSONE (DELTASONE) 10 MG tablet Take 5 daily for 2 days followed by 4,3,2 and 1 for 2 days each.  . progesterone (PROMETRIUM) 200 MG capsule 12 tabs every four months  . sertraline (ZOLOFT) 50 MG tablet Take 1.5 tablets (75 mg total) by mouth daily.  Alveda Reasons 15 MG TABS tablet TAKE 1 TABLET (15 MG TOTAL) BY MOUTH DAILY.   No facility-administered encounter medications on file as of 03/26/2017.     ALLERGIES:  Allergies  Allergen Reactions  . Codeine Nausea Only  . Tramadol Nausea Only     PHYSICAL EXAM:  ECOG Performance status: 1 - Symptomatic, but independent  Vitals:   03/26/17 1347  BP: (!) 125/94  Pulse: (!) 57  Resp: 18  Temp: 97.4 F (36.3 C)   Filed Weights   03/26/17 1347  Weight: 157 lb (71.2 kg)    Physical Exam  Constitutional: She is oriented to person, place, and time and well-developed, well-nourished, and in no distress.  HENT:  Head: Normocephalic.  Mouth/Throat: Oropharynx is  clear and moist. No oropharyngeal exudate.  Eyes: Conjunctivae are normal. Pupils are equal, round, and reactive to light. No scleral icterus.  Neck: Normal range of motion. Neck supple.  Cardiovascular: Normal rate, regular rhythm and normal heart sounds.   Pulmonary/Chest: Effort normal and breath sounds normal. No respiratory distress.  Abdominal: Soft. Bowel sounds are normal. There is no tenderness.  Musculoskeletal: Normal range of motion. She exhibits no edema.  Lymphadenopathy:    She has  no cervical adenopathy.       Right: No supraclavicular adenopathy present.       Left: No supraclavicular adenopathy present.  Neurological: She is alert and oriented to person, place, and time. No cranial nerve deficit. Gait normal.  Skin: Skin is warm and dry. No rash noted.  Psychiatric: Mood, memory, affect and judgment normal.  Nursing note and vitals reviewed.    LABORATORY DATA:  I have reviewed the labs as listed.  CBC    Component Value Date/Time   WBC 11.4 (H) 03/26/2017 1302   RBC 4.37 03/26/2017 1302   HGB 14.0 03/26/2017 1302   HCT 41.3 03/26/2017 1302   HCT 38.9 11/06/2016 1528   PLT 438 (H) 03/26/2017 1302   PLT 424 (H) 11/06/2016 1528   MCV 94.5 03/26/2017 1302   MCV 91 11/06/2016 1528   MCH 32.0 03/26/2017 1302   MCHC 33.9 03/26/2017 1302   RDW 14.2 03/26/2017 1302   RDW 15.1 11/06/2016 1528   LYMPHSABS 1.2 03/26/2017 1302   LYMPHSABS 2.5 11/06/2016 1528   MONOABS 0.4 03/26/2017 1302   EOSABS 0.1 03/26/2017 1302   EOSABS 0.1 11/06/2016 1528   BASOSABS 0.0 03/26/2017 1302   BASOSABS 0.0 11/06/2016 1528   CMP Latest Ref Rng & Units 03/26/2017 11/20/2016 09/25/2016  Glucose 65 - 99 mg/dL 160(H) 99 141(H)  BUN 6 - 20 mg/dL 36(H) 20 18  Creatinine 0.44 - 1.00 mg/dL 1.51(H) 1.07(H) 1.09(H)  Sodium 135 - 145 mmol/L 141 137 143  Potassium 3.5 - 5.1 mmol/L 5.0 4.0 4.3  Chloride 101 - 111 mmol/L 104 105 101  CO2 22 - 32 mmol/L 28 24 27   Calcium 8.9 - 10.3 mg/dL 9.6  9.7 9.9  Total Protein 6.5 - 8.1 g/dL 6.5 7.1 6.3  Total Bilirubin 0.3 - 1.2 mg/dL 0.5 0.7 0.3  Alkaline Phos 38 - 126 U/L 59 44 63  AST 15 - 41 U/L 22 20 20   ALT 14 - 54 U/L 21 12(L) 9    PENDING LABS:    DIAGNOSTIC IMAGING:    PATHOLOGY:  JAK2 testing: 11/27/15    ASSESSMENT & PLAN:   Essential thrombocytosis, JAK2+: -Diagnosed in 11/2015 with JAK2+ genotype.  -Platelets mildly elevated today at 438,000. Mild leukocytosis at 11.4 today. Could be due to recent steroids.   -Discussed role that Hydrea plays in managing thrombocytosis in patients who are JAK2+.  She understands that stopping Hydrea would likely increase platelets to potential dangerous levels, putting her at increased risk for thrombosis.  -Continue Hydrea twice weekly for now; will bring her back to recheck CBC with diff in about 2 weeks to see if mild leukocytosis and mild thrombocytosis have resolved in that time. If platelets remain elevated on lab recheck, then can consider increasing Hydrea to three times per week. We will certainly be in touch with patient with lab results in 2 weeks.  -She requests an order to have her labs collected in 2 weeks at her PCP's office, which is more convenient for her. Paper prescription for CBC with diff given to patient, along with fax number for results to be faxed to when they are available.  She agreed with this plan.   -Return to cancer center in 4 months with labs.   *Of note, when refilling Hydrea, patient requests that prescription reads: Hydrea 500 mg po daily, #30, as this is cheaper for her with insurance coverage than including Monday/Thursday only administration days on prescription. She reliably takes medication as prescribed.  Dispo:  -Labs only in 2 weeks.  -Return to cancer center in 4 months for continued follow-up and labs.    All questions were answered to patient's stated satisfaction. Encouraged patient to call with any new concerns or questions  before her next visit to the cancer center and we can certain see her sooner, if needed.    Plan of care discussed with Dr. Irene Limbo, who agrees with the above aforementioned.    Orders placed this encounter:  Orders Placed This Encounter  Procedures  . CBC with Differential/Platelet  . Comprehensive metabolic panel      Mike Craze, NP [facility] [phone]

## 2017-04-01 ENCOUNTER — Other Ambulatory Visit: Payer: Self-pay | Admitting: Family Medicine

## 2017-04-09 ENCOUNTER — Other Ambulatory Visit: Payer: Medicare Other

## 2017-04-09 DIAGNOSIS — E8881 Metabolic syndrome: Secondary | ICD-10-CM

## 2017-04-09 DIAGNOSIS — D473 Essential (hemorrhagic) thrombocythemia: Secondary | ICD-10-CM | POA: Diagnosis not present

## 2017-04-09 DIAGNOSIS — Z780 Asymptomatic menopausal state: Secondary | ICD-10-CM

## 2017-04-09 DIAGNOSIS — E559 Vitamin D deficiency, unspecified: Secondary | ICD-10-CM

## 2017-04-09 DIAGNOSIS — I10 Essential (primary) hypertension: Secondary | ICD-10-CM | POA: Diagnosis not present

## 2017-04-09 DIAGNOSIS — Z1589 Genetic susceptibility to other disease: Secondary | ICD-10-CM

## 2017-04-10 LAB — CBC WITH DIFFERENTIAL/PLATELET
BASOS ABS: 0 10*3/uL (ref 0.0–0.2)
Basos: 0 %
EOS (ABSOLUTE): 0.1 10*3/uL (ref 0.0–0.4)
Eos: 1 %
HEMOGLOBIN: 12.1 g/dL (ref 11.1–15.9)
Hematocrit: 37.1 % (ref 34.0–46.6)
IMMATURE GRANS (ABS): 0 10*3/uL (ref 0.0–0.1)
Immature Granulocytes: 0 %
LYMPHS: 21 %
Lymphocytes Absolute: 1.2 10*3/uL (ref 0.7–3.1)
MCH: 30.7 pg (ref 26.6–33.0)
MCHC: 32.6 g/dL (ref 31.5–35.7)
MCV: 94 fL (ref 79–97)
MONOCYTES: 9 %
Monocytes Absolute: 0.5 10*3/uL (ref 0.1–0.9)
NEUTROS ABS: 3.9 10*3/uL (ref 1.4–7.0)
Neutrophils: 69 %
Platelets: 277 10*3/uL (ref 150–379)
RBC: 3.94 x10E6/uL (ref 3.77–5.28)
RDW: 14.9 % (ref 12.3–15.4)
WBC: 5.7 10*3/uL (ref 3.4–10.8)

## 2017-04-10 LAB — BMP8+EGFR
BUN/Creatinine Ratio: 15 (ref 12–28)
BUN: 19 mg/dL (ref 8–27)
CALCIUM: 9.3 mg/dL (ref 8.7–10.3)
CO2: 26 mmol/L (ref 18–29)
CREATININE: 1.28 mg/dL — AB (ref 0.57–1.00)
Chloride: 102 mmol/L (ref 96–106)
GFR calc Af Amer: 44 mL/min/{1.73_m2} — ABNORMAL LOW (ref >59)
GFR, EST NON AFRICAN AMERICAN: 38 mL/min/{1.73_m2} — AB (ref >59)
Glucose: 102 mg/dL — ABNORMAL HIGH (ref 65–99)
POTASSIUM: 4.2 mmol/L (ref 3.5–5.2)
SODIUM: 143 mmol/L (ref 134–144)

## 2017-04-10 LAB — LIPID PANEL
CHOLESTEROL TOTAL: 188 mg/dL (ref 100–199)
Chol/HDL Ratio: 3.4 ratio (ref 0.0–4.4)
HDL: 55 mg/dL (ref >39)
LDL CALC: 117 mg/dL — AB (ref 0–99)
TRIGLYCERIDES: 79 mg/dL (ref 0–149)
VLDL Cholesterol Cal: 16 mg/dL (ref 5–40)

## 2017-04-10 LAB — VITAMIN D 25 HYDROXY (VIT D DEFICIENCY, FRACTURES): VIT D 25 HYDROXY: 56.9 ng/mL (ref 30.0–100.0)

## 2017-04-12 ENCOUNTER — Ambulatory Visit: Payer: Medicare Other | Admitting: Family Medicine

## 2017-04-17 ENCOUNTER — Encounter: Payer: Self-pay | Admitting: Nurse Practitioner

## 2017-04-17 ENCOUNTER — Ambulatory Visit (INDEPENDENT_AMBULATORY_CARE_PROVIDER_SITE_OTHER): Payer: Medicare Other | Admitting: Nurse Practitioner

## 2017-04-17 VITALS — BP 139/53 | HR 63 | Temp 98.6°F | Ht 62.0 in | Wt 163.4 lb

## 2017-04-17 DIAGNOSIS — W57XXXA Bitten or stung by nonvenomous insect and other nonvenomous arthropods, initial encounter: Secondary | ICD-10-CM

## 2017-04-17 DIAGNOSIS — S2090XA Unspecified superficial injury of unspecified parts of thorax, initial encounter: Secondary | ICD-10-CM | POA: Diagnosis not present

## 2017-04-17 MED ORDER — SULFAMETHOXAZOLE-TRIMETHOPRIM 800-160 MG PO TABS: 1.0000 | ORAL_TABLET | Freq: Two times a day (BID) | ORAL | 0 refills | Status: DC

## 2017-04-17 NOTE — Progress Notes (Signed)
   Subjective:    Patient ID: Kristy [name], female    DOB: [birth year], 83 y.o.   MRN: [id number]  HPI Patient was doing yard work Wednesday and got bit or stung by something. Very itchy and feels ike has a knot on it.    Review of Systems  Constitutional: Negative.   HENT: Negative.   Respiratory: Negative.   Cardiovascular: Negative.   Neurological: Negative.   Psychiatric/Behavioral: Negative.   All other systems reviewed and are negative.      Objective:   Physical Exam  Constitutional: She is oriented to person, place, and time. She appears well-developed. No distress.  Cardiovascular: Normal rate and regular rhythm.   Pulmonary/Chest: Effort normal.  Neurological: She is alert and oriented to person, place, and time.  Skin: Skin is warm.  5cm erythemmatous hard  Lesion left upper back  Psychiatric: She has a normal mood and affect. Her behavior is normal. Judgment and thought content normal.   BP (!) 139/53   Pulse 63   Temp 98.6 F (37 C) (Oral)   Ht 5\' 2"  (1.575 m)   Wt 163 lb 6.4 oz (74.1 kg)   BMI 29.89 kg/m        Assessment & Plan:   1. Insect bite, initial encounter    Meds ordered this encounter  Medications  . sulfamethoxazole-trimethoprim (BACTRIM DS) 800-160 MG tablet    Sig: Take 1 tablet by mouth 2 (two) times daily.    Dispense:  14 tablet    Refill:  0    Order Specific Question:   Supervising Provider    Answer:   Assunta Found L [[id number]]   Benadryl cream OTC prn Avoid scratching RTO prn  Mary-Margaret Hassell Done, FNP

## 2017-04-17 NOTE — Patient Instructions (Signed)
Insect Bite, Adult An insect bite can make your skin red, itchy, and swollen. Some insects can spread disease to people with a bite. However, most insect bites do not lead to disease, and most are not serious. Follow these instructions at home: Bite area care   Do not scratch the bite area.  Keep the bite area clean and dry.  Wash the bite area every day with soap and water as told by your doctor.  Check the bite area every day for signs of infection. Check for:  More redness, swelling, or pain.  Fluid or blood.  Warmth.  Pus. Managing pain, itching, and swelling   You may put any of these on the bite area as told by your doctor:  A baking soda paste.  Cortisone cream.  Calamine lotion.  If directed, put ice on the bite area.  Put ice in a plastic bag.  Place a towel between your skin and the bag.  Leave the ice on for 20 minutes, 2-3 times a day. Medicines   Take medicines or put medicines on your skin only as told by your doctor.  If you were prescribed an antibiotic medicine, use it as told by your doctor. Do not stop using the antibiotic even if your condition improves. General instructions   Keep all follow-up visits as told by your doctor. This is important. How is this prevented? To help you have a lower risk of insect bites:  When you are outside, wear clothing that covers your arms and legs.  Use insect repellent. The best insect repellents have:  An active ingredient of DEET, picaridin, oil of lemon eucalyptus (OLE), or [id number].  Higher amounts of DEET or another active ingredient than other repellents have.  If your home windows do not have screens, think about putting some in. Contact a doctor if:  You have more redness, swelling, or pain in the bite area.  You have fluid, blood, or pus coming from the bite area.  The bite area feels warm.  You have a fever. Get help right away if:  You have joint pain.  You have a rash.  You have  shortness of breath.  You feel more tired or sleepy than you normally do.  You have neck pain.  You have a headache.  You feel weaker than you normally do.  You have chest pain.  You have pain in your belly.  You feel sick to your stomach (nauseous) or you throw up (vomit). Summary  An insect bite can make your skin red, itchy, and swollen.  Do not scratch the bite area, and keep it clean and dry.  Ice can help with pain and itching from the bite. This information is not intended to replace advice given to you by your health care provider. Make sure you discuss any questions you have with your health care provider. Document Released: 11/20/2000 Document Revised: 06/25/2016 Document Reviewed: 04/10/2015 Elsevier Interactive Patient Education  2017 [employer]  

## 2017-04-19 ENCOUNTER — Other Ambulatory Visit: Payer: Self-pay | Admitting: Family Medicine

## 2017-04-28 ENCOUNTER — Other Ambulatory Visit: Payer: Self-pay | Admitting: Cardiology

## 2017-04-28 DIAGNOSIS — Z961 Presence of intraocular lens: Secondary | ICD-10-CM | POA: Diagnosis not present

## 2017-04-28 DIAGNOSIS — H401233 Low-tension glaucoma, bilateral, severe stage: Secondary | ICD-10-CM | POA: Diagnosis not present

## 2017-04-28 DIAGNOSIS — H47013 Ischemic optic neuropathy, bilateral: Secondary | ICD-10-CM | POA: Diagnosis not present

## 2017-04-28 NOTE — Telephone Encounter (Signed)
REFILL 

## 2017-05-05 ENCOUNTER — Encounter: Payer: Self-pay | Admitting: Family Medicine

## 2017-05-05 ENCOUNTER — Ambulatory Visit (INDEPENDENT_AMBULATORY_CARE_PROVIDER_SITE_OTHER): Payer: Medicare Other | Admitting: Family Medicine

## 2017-05-05 VITALS — BP 130/59 | HR 60 | Temp 97.4°F | Ht 62.0 in | Wt 161.0 lb

## 2017-05-05 DIAGNOSIS — E559 Vitamin D deficiency, unspecified: Secondary | ICD-10-CM | POA: Diagnosis not present

## 2017-05-05 DIAGNOSIS — N183 Chronic kidney disease, stage 3 unspecified: Secondary | ICD-10-CM

## 2017-05-05 DIAGNOSIS — I480 Paroxysmal atrial fibrillation: Secondary | ICD-10-CM

## 2017-05-05 DIAGNOSIS — K219 Gastro-esophageal reflux disease without esophagitis: Secondary | ICD-10-CM

## 2017-05-05 DIAGNOSIS — R609 Edema, unspecified: Secondary | ICD-10-CM

## 2017-05-05 DIAGNOSIS — E8881 Metabolic syndrome: Secondary | ICD-10-CM | POA: Diagnosis not present

## 2017-05-05 DIAGNOSIS — I10 Essential (primary) hypertension: Secondary | ICD-10-CM | POA: Diagnosis not present

## 2017-05-05 DIAGNOSIS — H353131 Nonexudative age-related macular degeneration, bilateral, early dry stage: Secondary | ICD-10-CM

## 2017-05-05 DIAGNOSIS — Z0000 Encounter for general adult medical examination without abnormal findings: Secondary | ICD-10-CM

## 2017-05-05 DIAGNOSIS — D473 Essential (hemorrhagic) thrombocythemia: Secondary | ICD-10-CM | POA: Diagnosis not present

## 2017-05-05 DIAGNOSIS — Z6829 Body mass index (BMI) 29.0-29.9, adult: Secondary | ICD-10-CM

## 2017-05-05 DIAGNOSIS — I129 Hypertensive chronic kidney disease with stage 1 through stage 4 chronic kidney disease, or unspecified chronic kidney disease: Secondary | ICD-10-CM

## 2017-05-05 DIAGNOSIS — D75839 Thrombocytosis, unspecified: Secondary | ICD-10-CM

## 2017-05-05 MED ORDER — HYOSCYAMINE SULFATE 0.125 MG SL SUBL: 0.1250 mg | SUBLINGUAL_TABLET | Freq: Four times a day (QID) | SUBLINGUAL | 0 refills | Status: DC | PRN

## 2017-05-05 MED ORDER — ALPRAZOLAM 0.25 MG PO TABS: 0.2500 mg | ORAL_TABLET | Freq: Every day | ORAL | 2 refills | Status: DC | PRN

## 2017-05-05 NOTE — Progress Notes (Signed)
Subjective:    Patient ID: Kristy [name], female    DOB: Jan 27, 1928, 83 y.o.   MRN: [id number]  HPI Pt here for follow up and management of chronic medical problems which includes a fib, hypertension, and gerd. She is taking medication regularly.The patient is doing well overall. She does complain of her feet swelling. She has a history of paroxysmal atrial fibrillation and essential thrombocytosis. She does see the hematologist periodically because of the thrombocytosis. The patient is still somewhat emotional about the loss of her husband and this would be expected. She denies any chest pain or any more shortness of breath than usual. She has a large hiatal hernia and this does affect her breathing. She does take Prilosec on a regular basis. This controls any reflux symptoms as much as possible. She denies any blood in the stool or black tarry bowel movements. She is passing her water without problems. Her recent lab work was reviewed with her during the visit today and her creatinine which had been elevated in the past is still elevated but improved. Her CBC was within normal limits with normal platelets this time. Her LDL C cholesterol was slightly elevated and she will be watching her diet more closely to help get the cholesterol down as a result of talking to her about this. Her liver function tests were good.    Patient Active Problem List   Diagnosis Date Noted  . Thoracic aortic atherosclerosis ([place]) 11/06/2016  . Metabolic syndrome 79/01/4096  . 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 Essential thrombocytosis ([place]) 01/24/2016  . PAF (paroxysmal atrial fibrillation) ([place]) 12/18/2013  . Cervical spondylosis without myelopathy 04/27/2013  . ABDOMINAL PAIN, UNSPECIFIED SITE 02/02/2011  . Palpitations 02/26/2010  . COLONIC POLYPS, HYPERPLASTIC 12/07/2007  . LIPOMA 12/07/2007  . OBESITY 12/07/2007  . Essential hypertension 12/07/2007  . GERD 12/07/2007  . HIATAL HERNIA 12/07/2007  .  DIVERTICULOSIS, COLON 12/07/2007  . FATTY LIVER DISEASE 12/07/2007  . ENDOMETRIAL POLYP 12/07/2007  . COMPLEX ENDOMETRIAL HYPERPLASIA WITHOUT ATYPIA 12/07/2007  . SOB 12/07/2007   Outpatient Encounter Prescriptions as of 05/05/2017  Medication Sig  . ALPRAZolam (XANAX) 0.25 MG tablet Take 1 tablet (0.25 mg total) by mouth daily as needed for anxiety.  . Cholecalciferol (VITAMIN D3) 2000 UNITS TABS Take by mouth daily.  . diclofenac sodium (VOLTAREN) 1 % GEL Apply 4 g topically 4 (four) times daily.  Marland Kitchen estradiol (CLIMARA - DOSED IN MG/24 HR) 0.025 mg/24hr patch Place 1 patch (0.025 mg total) onto the skin once a week.  Marland Kitchen glucose blood (ONE TOUCH ULTRA TEST) test strip Test once qd. DX E11.9  . hydrochlorothiazide (HYDRODIURIL) 12.5 MG tablet TAKE 1 TABLET DAILY  . hydroxyurea (HYDREA) 500 MG capsule On Monday & Thursday  . metoprolol succinate (TOPROL-XL) 25 MG 24 hr tablet Take 25 mg by mouth daily.  . Multiple Vitamin (MULTIVITAMIN) tablet Take 1 tablet by mouth daily.    Marland Kitchen omeprazole (PRILOSEC) 20 MG capsule TAKE (1) CAPSULE DAILY  . Polyethyl Glycol-Propyl Glycol (SYSTANE OP) Apply 1-2 drops to eye 4 (four) times daily as needed (dry eyes).  . predniSONE (DELTASONE) 10 MG tablet Take 5 daily for 2 days followed by 4,3,2 and 1 for 2 days each.  . progesterone (PROMETRIUM) 200 MG capsule 12 tabs every four months  . sertraline (ZOLOFT) 50 MG tablet Take 1.5 tablets (75 mg total) by mouth daily.  Alveda Reasons 15 MG TABS tablet TAKE 1 TABLET (15 MG TOTAL) BY MOUTH DAILY.  . [  DISCONTINUED] celecoxib (CELEBREX) 200 MG capsule Take 1 capsule (200 mg total) by mouth daily. With food for joint pain  . [DISCONTINUED] latanoprost (XALATAN) 0.005 % ophthalmic solution Place 1 drop into both eyes daily.  . [DISCONTINUED] sulfamethoxazole-trimethoprim (BACTRIM DS) 800-160 MG tablet Take 1 tablet by mouth 2 (two) times daily.   No facility-administered encounter medications on file as of 05/05/2017.        Review of Systems  Constitutional: Negative.   HENT: Negative.   Eyes: Negative.   Respiratory: Negative.   Cardiovascular: Positive for leg swelling (and feet).  Gastrointestinal: Negative.   Endocrine: Negative.   Genitourinary: Negative.   Musculoskeletal: Negative.   Skin: Negative.   Allergic/Immunologic: Negative.   Neurological: Negative.   Hematological: Negative.   Psychiatric/Behavioral: Negative.        Objective:   Physical Exam  Constitutional: She is oriented to person, place, and time. She appears well-developed and well-nourished. No distress.  The patient is pleasant and alert and somewhat emotional due to the loss of her husband.  HENT:  Head: Normocephalic and atraumatic.  Right Ear: External ear normal.  Left Ear: External ear normal.  Nose: Nose normal.  Mouth/Throat: Oropharynx is clear and moist. No oropharyngeal exudate.  Eyes: Conjunctivae and EOM are normal. Pupils are equal, round, and reactive to light. Right eye exhibits no discharge. Left eye exhibits no discharge. No scleral icterus.  Neck: Normal range of motion. Neck supple. No thyromegaly present.  No bruits thyromegaly or anterior cervical adenopathy  Cardiovascular: Normal rate, regular rhythm and intact distal pulses.   No murmur heard. The heart is regular at 72/m  Pulmonary/Chest: Effort normal and breath sounds normal. No respiratory distress. She has no wheezes. She has no rales.  Clear anteriorly and posteriorly  Abdominal: Soft. Bowel sounds are normal. She exhibits no mass. There is no tenderness. There is no rebound and no guarding.  No abdominal tenderness masses bruits or organ enlargement  Musculoskeletal: Normal range of motion. She exhibits edema. She exhibits no tenderness.  Slight pedal edema on the left with increased varicosities on the side.  Lymphadenopathy:    She has no cervical adenopathy.  Neurological: She is alert and oriented to person, place, and time.  She has normal reflexes. No cranial nerve deficit.  Skin: Skin is warm and dry. No rash noted.  The insect bite is healing well under her left bra strap on her backside.  Psychiatric: She has a normal mood and affect. Her behavior is normal. Judgment and thought content normal.  Nursing note and vitals reviewed.  BP (!) 130/59 (BP Location: Left Arm)   Pulse 60   Temp 97.4 F (36.3 C) (Oral)   Ht 5\' 2"  (1.575 m)   Wt 161 lb (73 kg)   BMI 29.45 kg/m         Assessment & Plan:  1. Paroxysmal atrial fibrillation (HCC) -The patient should continue to follow-up with cardiology on a regular basis.  2. Vitamin D deficiency -The vitamin D level was good and she will continue with current treatment  3. Essential hypertension -Blood pressure is good and she will continue with current treatment  4. Gastroesophageal reflux disease, esophagitis presence not specified -Continue with Prilosec as this seems to be controlling her symptoms.  5. Metabolic syndrome -Continue to work with diet and exercise to lose weight.  6. Thrombocytosis ([place]) -The most recent platelet count was good and she will continue to follow-up with hematology  7. BMI 29.0-29.9,adult -  Continue with as aggressive therapeutic lifestyle changes as possible which include diet and exercise  8. Edema, unspecified type -Watch sodium intake and wear support hose that she puts on the first thing with getting out of the bed in the morning. -She will call us in a couple weeks with the progress of using the support hose and we may encourage her to take an extra half of a fluid pill on Monday Wednesday and Friday if needed for to control the swelling. - Thyroid Panel With TSH  9. Health care maintenance - Thyroid Panel With TSH  10. Stage 3 chronic kidney disease -Continue to avoid all NSAIDs like ibuprofen and Aleve and watch sodium intake.  11. Benign hypertension with chronic kidney disease, stage III  12. Dry  macular degeneration -Continue to follow-up with ophthalmology  Meds ordered this encounter  Medications  . ALPRAZolam (XANAX) 0.25 MG tablet    Sig: Take 1 tablet (0.25 mg total) by mouth daily as needed for anxiety.    Dispense:  30 tablet    Refill:  2   Patient Instructions                       Medicare Annual Wellness Visit  [facility] and the medical providers at [facility] strive to bring you the best medical care.  In doing so we not only want to address your current medical conditions and concerns but also to detect new conditions early and prevent illness, disease and health-related problems.    Medicare offers a yearly Wellness Visit which allows our clinical staff to assess your need for preventative services including immunizations, lifestyle education, counseling to decrease risk of preventable diseases and screening for fall risk and other medical concerns.    This visit is provided free of charge (no copay) for all Medicare recipients. The clinical pharmacists at [facility] have begun to conduct these Wellness Visits which will also include a thorough review of all your medications.    As you primary medical provider recommend that you make an appointment for your Annual Wellness Visit if you have not done so already this year.  You may set up this appointment before you leave today or you may call back ([phone]) and schedule an appointment.  Please make sure when you call that you mention that you are scheduling your Annual Wellness Visit with the clinical pharmacist so that the appointment may be made for the proper length of time.     Continue current medications. Continue good therapeutic lifestyle changes which include good diet and exercise. Fall precautions discussed with patient. If an FOBT was given today- please return it to our front desk. If you are over 84 years old - you may need Prevnar 67 or the adult  Pneumonia vaccine.  **Flu shots are available--- please call and schedule a FLU-CLINIC appointment**  After your visit with [place] today you will receive a survey in the mail or online from [employer] regarding your care with [place]. Please take a moment to fill this out. Your feedback is very important to [place] as you can help [place] better understand your patient needs as well as improve your experience and satisfaction. WE CARE ABOUT YOU!!!   Continue to follow-up with cardiology and hematology. Continue to follow-up with ophthalmology Try wearing the support hose and put these on the first thing with arising in the morning. Let us know if this helps the swelling in  your feet more. After trying this for a couple weeks she may still add to this Or half of an HCTZ on Monday Wednesday and Friday if needed.  Arrie Senate MD

## 2017-05-05 NOTE — Addendum Note (Signed)
Addended by: Zannie Cove on: 05/05/2017 11:08 AM   Modules accepted: Orders

## 2017-05-05 NOTE — Patient Instructions (Addendum)
Medicare Annual Wellness Visit  [facility] and the medical providers at [facility] strive to bring you the best medical care.  In doing so we not only want to address your current medical conditions and concerns but also to detect new conditions early and prevent illness, disease and health-related problems.    Medicare offers a yearly Wellness Visit which allows our clinical staff to assess your need for preventative services including immunizations, lifestyle education, counseling to decrease risk of preventable diseases and screening for fall risk and other medical concerns.    This visit is provided free of charge (no copay) for all Medicare recipients. The clinical pharmacists at [facility] have begun to conduct these Wellness Visits which will also include a thorough review of all your medications.    As you primary medical provider recommend that you make an appointment for your Annual Wellness Visit if you have not done so already this year.  You may set up this appointment before you leave today or you may call back ([phone]) and schedule an appointment.  Please make sure when you call that you mention that you are scheduling your Annual Wellness Visit with the clinical pharmacist so that the appointment may be made for the proper length of time.     Continue current medications. Continue good therapeutic lifestyle changes which include good diet and exercise. Fall precautions discussed with patient. If an FOBT was given today- please return it to our front desk. If you are over 25 years old - you may need Prevnar 27 or the adult Pneumonia vaccine.  **Flu shots are available--- please call and schedule a FLU-CLINIC appointment**  After your visit with [place] today you will receive a survey in the mail or online from [employer] regarding your care with [place]. Please take a moment to fill this out. Your feedback is very  important to [place] as you can help [place] better understand your patient needs as well as improve your experience and satisfaction. WE CARE ABOUT YOU!!!   Continue to follow-up with cardiology and hematology. Continue to follow-up with ophthalmology Try wearing the support hose and put these on the first thing with arising in the morning. Let us know if this helps the swelling in your feet more. After trying this for a couple weeks she may still add to this Or half of an HCTZ on Monday Wednesday and Friday if needed.

## 2017-05-06 LAB — THYROID PANEL WITH TSH
FREE THYROXINE INDEX: 1.9 (ref 1.2–4.9)
T3 Uptake Ratio: 25 % (ref 24–39)
T4, Total: 7.6 ug/dL (ref 4.5–12.0)
TSH: 2.51 u[IU]/mL (ref 0.450–4.500)

## 2017-05-10 ENCOUNTER — Other Ambulatory Visit: Payer: Self-pay | Admitting: *Deleted

## 2017-05-10 DIAGNOSIS — W57XXXA Bitten or stung by nonvenomous insect and other nonvenomous arthropods, initial encounter: Secondary | ICD-10-CM | POA: Insufficient documentation

## 2017-05-10 MED ORDER — DOXYCYCLINE HYCLATE 100 MG PO TABS: 100.0000 mg | ORAL_TABLET | Freq: Two times a day (BID) | ORAL | 0 refills | Status: DC

## 2017-05-11 ENCOUNTER — Other Ambulatory Visit: Payer: Medicare Other

## 2017-05-11 DIAGNOSIS — W57XXXA Bitten or stung by nonvenomous insect and other nonvenomous arthropods, initial encounter: Secondary | ICD-10-CM

## 2017-05-11 DIAGNOSIS — Z7689 Persons encountering health services in other specified circumstances: Secondary | ICD-10-CM | POA: Diagnosis not present

## 2017-05-13 LAB — LYME AB/WESTERN BLOT REFLEX
LYME DISEASE AB, QUANT, IGM: <0.8 index (ref 0.00–0.79)
Lyme IgG/IgM Ab: <0.91 {ISR} (ref 0.00–0.90)

## 2017-05-13 LAB — RMSF, IGG, IFA: RMSF, IGG, IFA: 1:128 {titer} — ABNORMAL HIGH

## 2017-05-13 LAB — ROCKY MTN SPOTTED FVR ABS PNL(IGG+IGM)
RMSF IGG: POSITIVE — AB
RMSF IgM: 0.18 index (ref 0.00–0.89)

## 2017-05-25 ENCOUNTER — Telehealth: Payer: Self-pay | Admitting: Family Medicine

## 2017-05-25 NOTE — Telephone Encounter (Signed)
Labs already faxed to [facility]. No office note.

## 2017-05-31 ENCOUNTER — Telehealth: Payer: Self-pay | Admitting: *Deleted

## 2017-05-31 MED ORDER — DOXYCYCLINE HYCLATE 100 MG PO TABS: 100.0000 mg | ORAL_TABLET | Freq: Two times a day (BID) | ORAL | 0 refills | Status: DC

## 2017-05-31 NOTE — Telephone Encounter (Signed)
Pt aware med sent 

## 2017-05-31 NOTE — Telephone Encounter (Signed)
Okay 1 more week

## 2017-06-17 ENCOUNTER — Other Ambulatory Visit: Payer: Self-pay | Admitting: Family Medicine

## 2017-07-21 DIAGNOSIS — H401233 Low-tension glaucoma, bilateral, severe stage: Secondary | ICD-10-CM | POA: Diagnosis not present

## 2017-07-21 DIAGNOSIS — H47013 Ischemic optic neuropathy, bilateral: Secondary | ICD-10-CM | POA: Diagnosis not present

## 2017-07-21 DIAGNOSIS — H04123 Dry eye syndrome of bilateral lacrimal glands: Secondary | ICD-10-CM | POA: Diagnosis not present

## 2017-07-21 DIAGNOSIS — Z961 Presence of intraocular lens: Secondary | ICD-10-CM | POA: Diagnosis not present

## 2017-07-26 ENCOUNTER — Other Ambulatory Visit (HOSPITAL_COMMUNITY): Payer: Medicare Other

## 2017-07-26 ENCOUNTER — Ambulatory Visit (HOSPITAL_COMMUNITY): Payer: Medicare Other

## 2017-07-29 DIAGNOSIS — H401233 Low-tension glaucoma, bilateral, severe stage: Secondary | ICD-10-CM | POA: Diagnosis not present

## 2017-08-12 ENCOUNTER — Other Ambulatory Visit: Payer: Self-pay | Admitting: Family Medicine

## 2017-08-26 DIAGNOSIS — H401233 Low-tension glaucoma, bilateral, severe stage: Secondary | ICD-10-CM | POA: Diagnosis not present

## 2017-08-30 ENCOUNTER — Encounter: Payer: Self-pay | Admitting: *Deleted

## 2017-09-02 ENCOUNTER — Encounter (HOSPITAL_COMMUNITY): Payer: Medicare Other | Attending: Oncology | Admitting: Oncology

## 2017-09-02 ENCOUNTER — Encounter (HOSPITAL_COMMUNITY): Payer: Self-pay | Admitting: Oncology

## 2017-09-02 ENCOUNTER — Encounter (HOSPITAL_COMMUNITY): Payer: Medicare Other

## 2017-09-02 VITALS — BP 178/68 | HR 64 | Resp 16 | Ht 62.0 in | Wt 165.5 lb

## 2017-09-02 DIAGNOSIS — D473 Essential (hemorrhagic) thrombocythemia: Secondary | ICD-10-CM | POA: Diagnosis not present

## 2017-09-02 DIAGNOSIS — Z1589 Genetic susceptibility to other disease: Secondary | ICD-10-CM | POA: Diagnosis present

## 2017-09-02 LAB — CBC WITH DIFFERENTIAL/PLATELET
BASOS ABS: 0 10*3/uL (ref 0.0–0.1)
Basophils Relative: 0 %
EOS PCT: 1 %
Eosinophils Absolute: 0.1 10*3/uL (ref 0.0–0.7)
HCT: 36.3 % (ref 36.0–46.0)
HEMOGLOBIN: 12.2 g/dL (ref 12.0–15.0)
LYMPHS PCT: 29 %
Lymphs Abs: 1.7 10*3/uL (ref 0.7–4.0)
MCH: 31.4 pg (ref 26.0–34.0)
MCHC: 33.6 g/dL (ref 30.0–36.0)
MCV: 93.6 fL (ref 78.0–100.0)
Monocytes Absolute: 0.5 10*3/uL (ref 0.1–1.0)
Monocytes Relative: 8 %
NEUTROS ABS: 3.6 10*3/uL (ref 1.7–7.7)
NEUTROS PCT: 62 %
PLATELETS: 376 10*3/uL (ref 150–400)
RBC: 3.88 MIL/uL (ref 3.87–5.11)
RDW: 14 % (ref 11.5–15.5)
WBC: 5.9 10*3/uL (ref 4.0–10.5)

## 2017-09-02 LAB — COMPREHENSIVE METABOLIC PANEL
ALK PHOS: 61 U/L (ref 38–126)
ALT: 12 U/L — AB (ref 14–54)
AST: 22 U/L (ref 15–41)
Albumin: 4.2 g/dL (ref 3.5–5.0)
Anion gap: 5 (ref 5–15)
BUN: 22 mg/dL — AB (ref 6–20)
CO2: 30 mmol/L (ref 22–32)
CREATININE: 1.37 mg/dL — AB (ref 0.44–1.00)
Calcium: 9.4 mg/dL (ref 8.9–10.3)
Chloride: 105 mmol/L (ref 101–111)
GFR calc Af Amer: 39 mL/min — ABNORMAL LOW (ref >60)
GFR, EST NON AFRICAN AMERICAN: 34 mL/min — AB (ref >60)
Glucose, Bld: 93 mg/dL (ref 65–99)
Potassium: 4.1 mmol/L (ref 3.5–5.1)
Sodium: 140 mmol/L (ref 135–145)
Total Bilirubin: 0.4 mg/dL (ref 0.3–1.2)
Total Protein: 6.7 g/dL (ref 6.5–8.1)

## 2017-09-02 NOTE — Progress Notes (Signed)
[place], [place] [id number]   CLINIC:  Medical Oncology/Hematology  PCP:  Kristy [name], Kristy [name] [id number] [phone]   REASON FOR VISIT:  Follow-up for Essential thrombocytosis, JAK2+  CURRENT THERAPY: Hydrea 500 mg twice per week (on Mondays & Thursdays)   INTERVAL HISTORY:  Kristy [name] 83 y.o. female presents for continued follow-up for JAK2+ thrombocytosis.   She is here today with her niece. She continues on Hydrea 500 mg on Mondays and Thursdays.  Continues to tolerate Hydrea well with no side effects. She states that the summer she got bit by a take and was asymptomatic except for generalized weakness and fatigue but she tested positive for [facility] spotted fever and she ended up taking doxycycline for 4 weeks. She states she has her baseline shortness of breath which is unchanged. She also is of fatigue. She states she has a hiatal hernia which is chronic.    REVIEW OF SYSTEMS:  Review of Systems  Constitutional: Positive for fatigue. Negative for chills and fever.  HENT:  Negative.  Negative for lump/mass and nosebleeds.   Eyes: Negative.   Respiratory: Negative.  Negative for cough and shortness of breath.   Cardiovascular: Negative.  Negative for chest pain and leg swelling.  Gastrointestinal: Negative.  Negative for abdominal pain, blood in stool, constipation, diarrhea, nausea and vomiting.  Endocrine: Negative.   Genitourinary: Negative.  Negative for dysuria and hematuria.   Musculoskeletal: Negative.  Negative for arthralgias.  Skin: Negative.  Negative for rash.  Neurological: Negative.  Negative for dizziness and headaches.  Hematological: Negative.  Negative for adenopathy. Does not bruise/bleed easily.     PAST MEDICAL/SURGICAL HISTORY:  Past Medical History:  Diagnosis Date  . A-fib ([place])   . Cataract   . Closed left arm fracture 2016  . Colon polyps   . Diverticulosis   .  Endometrial hyperplasia without atypia, simple   . Endometrial polyp   . Fatty liver   . GERD (gastroesophageal reflux disease)   . Hiatal hernia   . Hyperplastic colon polyp   . Hypertension   . Lipoma   . Obesity   . Optic neuropathy of both eyes 1989, 1997  . SOB (shortness of breath)   . Thrombocytosis ([place])    Past Surgical History:  Procedure Laterality Date  . breast biopsy    . CATARACT EXTRACTION    . CHOLECYSTECTOMY  03/2002  . DILATION AND CURETTAGE OF UTERUS     x 2  . EYE SURGERY    . HERNIA REPAIR    . KNEE SURGERY    . optic neuritis    . umbilical hernia repair  09/2002     SOCIAL HISTORY:  Social History   Social History  . Marital status: Married    Spouse name: N/A  . Number of children: N/A  . Years of education: N/A   Occupational History  . Kristy [name] Retired    Retired   Social History Main Topics  . Smoking status: Former Smoker    Quit date: 12/07/1982  . Smokeless tobacco: Never Used  . Alcohol use No  . Drug use: No  . Sexual activity: No   Other Topics Concern  . Not on file   Social History Narrative  . No narrative on file    FAMILY HISTORY:  Family History  Problem Relation Age of Onset  . Diabetes Mother   .  Diabetes Sister   . Stroke Father   . Diabetes Brother   . Heart disease Brother   . Cancer Sister        lung  . Cancer Sister        vulva  . Cancer Sister   . Dementia Sister   . Osteoporosis Sister   . Diabetes Sister   . Osteoporosis Sister   . Diabetes Sister   . Cancer Brother        bladder  . Colon cancer Neg Hx     CURRENT MEDICATIONS:  Outpatient Encounter Prescriptions as of 09/02/2017  Medication Sig  . ALPRAZolam (XANAX) 0.25 MG tablet Take 1 tablet (0.25 mg total) by mouth daily as needed for anxiety.  . Cholecalciferol (VITAMIN D3) 2000 UNITS TABS Take by mouth daily.  . diclofenac sodium (VOLTAREN) 1 % GEL Apply 4 g topically 4 (four) times daily.  Marland Kitchen  doxycycline (VIBRA-TABS) 100 MG tablet Take 1 tablet (100 mg total) by mouth 2 (two) times daily.  Marland Kitchen estradiol (CLIMARA - DOSED IN MG/24 HR) 0.025 mg/24hr patch APPLY 1 PATCH ONCE A WEEK. REMOVE OLD PATCH EACH WEEK  . glucose blood (ONE TOUCH ULTRA TEST) test strip Test once qd. DX E11.9  . hydrochlorothiazide (HYDRODIURIL) 12.5 MG tablet TAKE 1 TABLET DAILY  . hydroxyurea (HYDREA) 500 MG capsule On Monday & Thursday  . hyoscyamine (LEVSIN SL) 0.125 MG SL tablet Place 1 tablet (0.125 mg total) under the tongue every 6 (six) hours as needed.  . metoprolol succinate (TOPROL-XL) 25 MG 24 hr tablet Take 25 mg by mouth daily.  . Multiple Vitamin (MULTIVITAMIN) tablet Take 1 tablet by mouth daily.    Marland Kitchen omeprazole (PRILOSEC) 20 MG capsule TAKE (1) CAPSULE DAILY  . Polyethyl Glycol-Propyl Glycol (SYSTANE OP) Apply 1-2 drops to eye 4 (four) times daily as needed (dry eyes).  . predniSONE (DELTASONE) 10 MG tablet Take 5 daily for 2 days followed by 4,3,2 and 1 for 2 days each.  . progesterone (PROMETRIUM) 200 MG capsule TAKE 12 CAPSULES EVERY FOUR MONTHS AS DIRECTED (DAYS 1 THROUGH 12)  . sertraline (ZOLOFT) 50 MG tablet TAKE 1 & 1/2 TABLETS ONCE DAILY  . XARELTO 15 MG TABS tablet TAKE 1 TABLET (15 MG TOTAL) BY MOUTH DAILY.   No facility-administered encounter medications on file as of 09/02/2017.     ALLERGIES:  Allergies  Allergen Reactions  . Codeine Nausea Only  . Latanoprost     Red and burning  . Tramadol Nausea Only     PHYSICAL EXAM:  ECOG Performance status: 1 - Symptomatic, but independent  There were no vitals filed for this visit. There were no vitals filed for this visit.  Physical Exam  Constitutional: She is oriented to person, place, and time and well-developed, well-nourished, and in no distress.  HENT:  Head: Normocephalic.  Mouth/Throat: Oropharynx is clear and moist. No oropharyngeal exudate.  Eyes: Pupils are equal, round, and reactive to light. Conjunctivae are  normal. No scleral icterus.  Neck: Normal range of motion. Neck supple.  Cardiovascular: Normal rate, regular rhythm and normal heart sounds.   Pulmonary/Chest: Effort normal and breath sounds normal. No respiratory distress.  Abdominal: Soft. Bowel sounds are normal. There is no tenderness.  Musculoskeletal: Normal range of motion. She exhibits no edema (ankle edema bilaterally).  Lymphadenopathy:    She has no cervical adenopathy.       Right: No supraclavicular adenopathy present.       Left: No supraclavicular  adenopathy present.  Neurological: She is alert and oriented to person, place, and time. No cranial nerve deficit. Gait normal.  Skin: Skin is warm and dry. No rash noted.  Psychiatric: Mood, memory, affect and judgment normal.  Nursing note and vitals reviewed.    LABORATORY DATA:  I have reviewed the labs as listed.  CBC    Component Value Date/Time   WBC 5.9 09/02/2017 1506   RBC 3.88 09/02/2017 1506   HGB 12.2 09/02/2017 1506   HGB 12.1 04/09/2017 0901   HCT 36.3 09/02/2017 1506   HCT 37.1 04/09/2017 0901   PLT 376 09/02/2017 1506   PLT 277 04/09/2017 0901   MCV 93.6 09/02/2017 1506   MCV 94 04/09/2017 0901   MCH 31.4 09/02/2017 1506   MCHC 33.6 09/02/2017 1506   RDW 14.0 09/02/2017 1506   RDW 14.9 04/09/2017 0901   LYMPHSABS 1.7 09/02/2017 1506   LYMPHSABS 1.2 04/09/2017 0901   MONOABS 0.5 09/02/2017 1506   EOSABS 0.1 09/02/2017 1506   EOSABS 0.1 04/09/2017 0901   BASOSABS 0.0 09/02/2017 1506   BASOSABS 0.0 04/09/2017 0901   CMP Latest Ref Rng & Units 04/09/2017 03/26/2017 11/20/2016  Glucose 65 - 99 mg/dL 102(H) 160(H) 99  BUN 8 - 27 mg/dL 19 36(H) 20  Creatinine 0.57 - 1.00 mg/dL 1.28(H) 1.51(H) 1.07(H)  Sodium 134 - 144 mmol/L 143 141 137  Potassium 3.5 - 5.2 mmol/L 4.2 5.0 4.0  Chloride 96 - 106 mmol/L 102 104 105  CO2 18 - 29 mmol/L 26 28 24   Calcium 8.7 - 10.3 mg/dL 9.3 9.6 9.7  Total Protein 6.5 - 8.1 g/dL - 6.5 7.1  Total Bilirubin 0.3 - 1.2  mg/dL - 0.5 0.7  Alkaline Phos 38 - 126 U/L - 59 44  AST 15 - 41 U/L - 22 20  ALT 14 - 54 U/L - 21 12(L)    PENDING LABS:    DIAGNOSTIC IMAGING:    PATHOLOGY:  JAK2 testing: 11/27/15    ASSESSMENT & PLAN:   Essential thrombocytosis, JAK2+: -Diagnosed in 11/2015 with JAK2+ genotype.  -Platelets 376k. Well controlled. Goal is to keep platelet count <400k. -Reviewed her CBC with her in detail today. CMP pending. -Continue hydrea 500mg  twice weekly on Monday and Thursday. Tolerating it well.    -Return to cancer center in 4 months with labs.   Dispo:  -Return to cancer center in 4 months for continued follow-up and labs.    All questions were answered to patient's stated satisfaction. Encouraged patient to call with any new concerns or questions before her next visit to the cancer center and we can certain see her sooner, if needed.     Orders placed this encounter:  Orders Placed This Encounter  Procedures  . CBC with Differential  . Comprehensive metabolic panel    Twana First, MD

## 2017-09-13 ENCOUNTER — Ambulatory Visit: Payer: Medicare Other | Admitting: Family Medicine

## 2017-09-23 ENCOUNTER — Ambulatory Visit: Payer: Medicare Other | Admitting: Family Medicine

## 2017-09-24 ENCOUNTER — Encounter: Payer: Self-pay | Admitting: Family Medicine

## 2017-09-24 ENCOUNTER — Ambulatory Visit (INDEPENDENT_AMBULATORY_CARE_PROVIDER_SITE_OTHER): Payer: Medicare Other | Admitting: Family Medicine

## 2017-09-24 VITALS — BP 151/72 | HR 65 | Temp 97.5°F | Ht 62.0 in | Wt 162.0 lb

## 2017-09-24 DIAGNOSIS — N183 Chronic kidney disease, stage 3 unspecified: Secondary | ICD-10-CM

## 2017-09-24 DIAGNOSIS — L989 Disorder of the skin and subcutaneous tissue, unspecified: Secondary | ICD-10-CM | POA: Diagnosis not present

## 2017-09-24 DIAGNOSIS — Z23 Encounter for immunization: Secondary | ICD-10-CM | POA: Diagnosis not present

## 2017-09-24 DIAGNOSIS — R5383 Other fatigue: Secondary | ICD-10-CM

## 2017-09-24 DIAGNOSIS — E559 Vitamin D deficiency, unspecified: Secondary | ICD-10-CM | POA: Diagnosis not present

## 2017-09-24 DIAGNOSIS — E8881 Metabolic syndrome: Secondary | ICD-10-CM | POA: Diagnosis not present

## 2017-09-24 DIAGNOSIS — D75839 Thrombocytosis, unspecified: Secondary | ICD-10-CM

## 2017-09-24 DIAGNOSIS — K219 Gastro-esophageal reflux disease without esophagitis: Secondary | ICD-10-CM | POA: Diagnosis not present

## 2017-09-24 DIAGNOSIS — R609 Edema, unspecified: Secondary | ICD-10-CM | POA: Diagnosis not present

## 2017-09-24 DIAGNOSIS — D473 Essential (hemorrhagic) thrombocythemia: Secondary | ICD-10-CM

## 2017-09-24 DIAGNOSIS — I480 Paroxysmal atrial fibrillation: Secondary | ICD-10-CM | POA: Diagnosis not present

## 2017-09-24 DIAGNOSIS — I10 Essential (primary) hypertension: Secondary | ICD-10-CM | POA: Diagnosis not present

## 2017-09-24 DIAGNOSIS — I700 Atherosclerosis of aorta: Secondary | ICD-10-CM

## 2017-09-24 LAB — URINALYSIS, COMPLETE
Bilirubin, UA: NEGATIVE
GLUCOSE, UA: NEGATIVE
KETONES UA: NEGATIVE
NITRITE UA: NEGATIVE
PH UA: 5 (ref 5.0–7.5)
PROTEIN UA: NEGATIVE
RBC, UA: NEGATIVE
Specific Gravity, UA: >1.03 — ABNORMAL HIGH (ref 1.005–1.030)
Urobilinogen, Ur: 0.2 mg/dL (ref 0.2–1.0)

## 2017-09-24 LAB — MICROSCOPIC EXAMINATION
Epithelial Cells (non renal): >10 /hpf — AB (ref 0–10)
Renal Epithel, UA: NONE SEEN /hpf

## 2017-09-24 NOTE — Addendum Note (Signed)
Addended by: Zannie Cove on: 09/24/2017 03:10 PM   Modules accepted: Orders

## 2017-09-24 NOTE — Progress Notes (Signed)
Subjective:    Patient ID: Kristy [name], female    DOB: 1928-08-20, 82 y.o.   MRN: [id number]  HPI Pt here for follow up and management of chronic medical problems which includes a fib, hyperlipidemia and hypertension. She is taking medication regularly.  Patient does complain of some swelling in her feet leg pain at night and fatigue since she was diagnosed with [facility] spotted fever.  She is due to return in FOBT and get some lab work today.  She is also due to get her flu shot.  The patient sees the hematologist for thrombocytosis.  Last CBC had a platelet count that was 376,000.  Her hemoglobin was good and stable at 12.2 white blood cell count was normal this was done the end of September.  She also had a CMP with a creatinine that was 1.37 and a GFR that was 34 which means that she has stage IIIb chronic kidney disease.  Patient's biggest complaint is her fatigue to the point of just not being able to do what she would like to do.  She is still grieving from the loss of her husband and this will obviously continue for a good while.  She denies any chest pain.  She has the shortness of breath secondary to the fatigue and attributes a lot of this to the very large hiatal hernia that she has.  She also complains of ongoing edema in her legs despite wearing support hose.  She takes Prilosec for her large hiatal hernia and does not have any issues with this unless she overeats and she can tell this very quickly.  She denies any blood in the stool or black tarry bowel movements.  She says she is passing her water okay but may have occasional burning.  She currently takes HCTZ 12.5 mg 1 daily and metoprolol 25 mg once daily.  She indicates that her last visit with the hematologist that her systolic blood pressure was elevated at 179 there and today is 151 here.  We will get lab work to monitor her fatigue and will probably ask her to come back again in about 4 weeks just to follow-up on the complaint she  had today.    Patient Active Problem List   Diagnosis Date Noted  . Tick bite 05/10/2017  . Thoracic aortic atherosclerosis ([place]) 11/06/2016  . Metabolic syndrome 93/71/6967  . 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 Essential thrombocytosis ([place]) 01/24/2016  . PAF (paroxysmal atrial fibrillation) ([place]) 12/18/2013  . Cervical spondylosis without myelopathy 04/27/2013  . ABDOMINAL PAIN, UNSPECIFIED SITE 02/02/2011  . Palpitations 02/26/2010  . COLONIC POLYPS, HYPERPLASTIC 12/07/2007  . LIPOMA 12/07/2007  . OBESITY 12/07/2007  . Essential hypertension 12/07/2007  . GERD 12/07/2007  . HIATAL HERNIA 12/07/2007  . DIVERTICULOSIS, COLON 12/07/2007  . FATTY LIVER DISEASE 12/07/2007  . ENDOMETRIAL POLYP 12/07/2007  . COMPLEX ENDOMETRIAL HYPERPLASIA WITHOUT ATYPIA 12/07/2007  . SOB 12/07/2007   Outpatient Encounter Prescriptions as of 09/24/2017  Medication Sig  . ALPRAZolam (XANAX) 0.25 MG tablet Take 1 tablet (0.25 mg total) by mouth daily as needed for anxiety.  . Cholecalciferol (VITAMIN D3) 2000 UNITS TABS Take by mouth daily.  Marland Kitchen estradiol (CLIMARA - DOSED IN MG/24 HR) 0.025 mg/24hr patch APPLY 1 PATCH ONCE A WEEK. REMOVE OLD PATCH EACH WEEK  . glucose blood (ONE TOUCH ULTRA TEST) test strip Test once qd. DX E11.9  . hydrochlorothiazide (HYDRODIURIL) 12.5 MG tablet TAKE 1 TABLET DAILY  . hydroxyurea (HYDREA) 500  MG capsule On Monday & Thursday  . hyoscyamine (LEVSIN SL) 0.125 MG SL tablet Place 1 tablet (0.125 mg total) under the tongue every 6 (six) hours as needed.  . metoprolol succinate (TOPROL-XL) 25 MG 24 hr tablet Take 25 mg by mouth daily.  . Multiple Vitamin (MULTIVITAMIN) tablet Take 1 tablet by mouth daily.    Marland Kitchen omeprazole (PRILOSEC) 20 MG capsule TAKE (1) CAPSULE DAILY  . Polyethyl Glycol-Propyl Glycol (SYSTANE OP) Apply 1-2 drops to eye 4 (four) times daily as needed (dry eyes).  . progesterone (PROMETRIUM) 200 MG capsule TAKE 12 CAPSULES EVERY FOUR MONTHS AS DIRECTED (DAYS  1 THROUGH 12)  . sertraline (ZOLOFT) 50 MG tablet TAKE 1 & 1/2 TABLETS ONCE DAILY  . XARELTO 15 MG TABS tablet TAKE 1 TABLET (15 MG TOTAL) BY MOUTH DAILY.   No facility-administered encounter medications on file as of 09/24/2017.       Review of Systems  Constitutional: Positive for fatigue.  HENT: Negative.   Eyes: Negative.   Respiratory: Negative.   Cardiovascular: Positive for leg swelling.  Gastrointestinal: Negative.   Endocrine: Negative.   Genitourinary: Negative.   Musculoskeletal: Positive for arthralgias (leg pain at night).  Skin: Negative.   Allergic/Immunologic: Negative.   Neurological: Negative.   Hematological: Negative.   Psychiatric/Behavioral: Negative.        Objective:   Physical Exam  Constitutional: She is oriented to person, place, and time. She appears well-developed and well-nourished.  Pleasant and alert and still stressed due to the loss of her husband and having had [facility] spotted fever  HENT:  Head: Normocephalic and atraumatic.  Right Ear: External ear normal.  Left Ear: External ear normal.  Nose: Nose normal.  Mouth/Throat: Oropharynx is clear and moist. No oropharyngeal exudate.  Eyes: Pupils are equal, round, and reactive to light. Conjunctivae and EOM are normal. Right eye exhibits no discharge. Left eye exhibits no discharge. No scleral icterus.  Neck: Normal range of motion. Neck supple. No thyromegaly present.  No bruits thyromegaly or anterior cervical adenopathy  Cardiovascular: Normal rate, regular rhythm, normal heart sounds and intact distal pulses.   No murmur heard. Heart has a regular rate and rhythm at 60/min  Pulmonary/Chest: Effort normal and breath sounds normal. No respiratory distress. She has no wheezes. She has no rales.  Clear anteriorly and posteriorly  Abdominal: Soft. Bowel sounds are normal. She exhibits no mass. There is no tenderness. There is no rebound and no guarding.  No abdominal tenderness masses  organs enlargement or bruits  Musculoskeletal: Normal range of motion. She exhibits no edema.  Lymphadenopathy:    She has no cervical adenopathy.  Neurological: She is alert and oriented to person, place, and time. She has normal reflexes. No cranial nerve deficit.  Skin: Skin is warm and dry. No rash noted.  Atypical lesion on superior scalp that we will refer for dermatology evaluation  Psychiatric: She has a normal mood and affect. Her behavior is normal. Judgment and thought content normal.  Nursing note and vitals reviewed.  BP (!) 151/72 (BP Location: Left Arm)   Pulse 65   Temp (!) 97.5 F (36.4 C) (Oral)   Ht 5\' 2"  (1.575 m)   Wt 162 lb (73.5 kg)   BMI 29.63 kg/m         Assessment & Plan:  1. Paroxysmal atrial fibrillation (HCC) -The patient is doing well with this and will follow-up yearly with her cardiologist.  2. Vitamin D deficiency -Continue current  treatment pending results of lab work she has had several fractures in the past. - VITAMIN D 25 Hydroxy (Vit-D Deficiency, Fractures)  3. Essential hypertension -The systolic blood pressure reading is elevated today.  Patient will check some additional readings and bring these back for review in about 4 weeks.  She has been eating foods that have had more sodium in them and she will reduce the sodium intake. - Lipid panel  4. Gastroesophageal reflux disease, esophagitis presence not specified -He has a large hiatal hernia and her symptoms with this or well controlled with Prilosec unless she overeats.  5. Metabolic syndrome -Continue with good diet habits and exercise  6. Thrombocytosis ([place]) -The last platelet count was within normal limits and she is continue to be followed by the hematologist.  7. Edema, unspecified type -Mild pedal edema.  The patient will take a half of a 20 mg Lasix for Monday Wednesday and Friday only for 3 doses.  She will continue with support hose.  8. Stage 3 chronic kidney disease  ([place]) -Continue to avoid all NSAIDs and monitor renal function closely  9. Essential thrombocytosis (HCC) -Continue to follow-up with hematology  10. Thoracic aortic atherosclerosis ([place]) -Continue aggressive therapeutic lifestyle changes - Lipid panel  11. Fatigue, unspecified type - Vitamin B12 - Thyroid Panel With TSH - Sedimentation rate - Magnesium - Brain natriuretic peptide  12. Scalp lesion - Ambulatory referral to Dermatology  Patient Instructions                       Medicare Annual Wellness Visit  [place] and the medical providers at [facility] strive to bring you the best medical care.  In doing so we not only want to address your current medical conditions and concerns but also to detect new conditions early and prevent illness, disease and health-related problems.    Medicare offers a yearly Wellness Visit which allows our clinical staff to assess your need for preventative services including immunizations, lifestyle education, counseling to decrease risk of preventable diseases and screening for fall risk and other medical concerns.    This visit is provided free of charge (no copay) for all Medicare recipients. The clinical pharmacists at [facility] have begun to conduct these Wellness Visits which will also include a thorough review of all your medications.    As you primary medical provider recommend that you make an appointment for your Annual Wellness Visit if you have not done so already this year.  You may set up this appointment before you leave today or you may call back ([phone]) and schedule an appointment.  Please make sure when you call that you mention that you are scheduling your Annual Wellness Visit with the clinical pharmacist so that the appointment may be made for the proper length of time.     Continue current medications. Continue good therapeutic lifestyle changes which include good diet and  exercise. Fall precautions discussed with patient. If an FOBT was given today- please return it to our front desk. If you are over 33 years old - you may need Prevnar 67 or the adult Pneumonia vaccine.  **Flu shots are available--- please call and schedule a FLU-CLINIC appointment**  After your visit with [place] today you will receive a survey in the mail or online from [employer] regarding your care with [place]. Please take a moment to fill this out. Your feedback is very important to [place] as you  can help [place] better understand your patient needs as well as improve your experience and satisfaction. WE CARE ABOUT YOU!!!   Blood pressure readings at home and bring these back for review in 4 weeks Try to get more walking and exercise in We will arrange for you to have  a visit with [facility] dermatology to biopsy the lesion on your scalp Continue to drink plenty of fluids Watch sodium intake  Arrie Senate MD

## 2017-09-24 NOTE — Patient Instructions (Addendum)
Medicare Annual Wellness Visit  [place] and the medical providers at [facility] strive to bring you the best medical care.  In doing so we not only want to address your current medical conditions and concerns but also to detect new conditions early and prevent illness, disease and health-related problems.    Medicare offers a yearly Wellness Visit which allows our clinical staff to assess your need for preventative services including immunizations, lifestyle education, counseling to decrease risk of preventable diseases and screening for fall risk and other medical concerns.    This visit is provided free of charge (no copay) for all Medicare recipients. The clinical pharmacists at [facility] have begun to conduct these Wellness Visits which will also include a thorough review of all your medications.    As you primary medical provider recommend that you make an appointment for your Annual Wellness Visit if you have not done so already this year.  You may set up this appointment before you leave today or you may call back ([phone]) and schedule an appointment.  Please make sure when you call that you mention that you are scheduling your Annual Wellness Visit with the clinical pharmacist so that the appointment may be made for the proper length of time.     Continue current medications. Continue good therapeutic lifestyle changes which include good diet and exercise. Fall precautions discussed with patient. If an FOBT was given today- please return it to our front desk. If you are over 36 years old - you may need Prevnar 84 or the adult Pneumonia vaccine.  **Flu shots are available--- please call and schedule a FLU-CLINIC appointment**  After your visit with [place] today you will receive a survey in the mail or online from [employer] regarding your care with [place]. Please take a moment to fill this out. Your feedback is very  important to [place] as you can help [place] better understand your patient needs as well as improve your experience and satisfaction. WE CARE ABOUT YOU!!!   Blood pressure readings at home and bring these back for review in 4 weeks Try to get more walking and exercise in We will arrange for you to have  a visit with [facility] dermatology to biopsy the lesion on your scalp Continue to drink plenty of fluids Watch sodium intake

## 2017-09-25 LAB — THYROID PANEL WITH TSH
Free Thyroxine Index: 1.8 (ref 1.2–4.9)
T3 UPTAKE RATIO: 23 % — AB (ref 24–39)
T4, Total: 7.9 ug/dL (ref 4.5–12.0)
TSH: 2.34 u[IU]/mL (ref 0.450–4.500)

## 2017-09-25 LAB — LIPID PANEL
CHOL/HDL RATIO: 3 ratio (ref 0.0–4.4)
Cholesterol, Total: 180 mg/dL (ref 100–199)
HDL: 60 mg/dL (ref >39)
LDL CALC: 95 mg/dL (ref 0–99)
Triglycerides: 127 mg/dL (ref 0–149)
VLDL Cholesterol Cal: 25 mg/dL (ref 5–40)

## 2017-09-25 LAB — VITAMIN D 25 HYDROXY (VIT D DEFICIENCY, FRACTURES): VIT D 25 HYDROXY: 50.4 ng/mL (ref 30.0–100.0)

## 2017-09-25 LAB — SEDIMENTATION RATE: SED RATE: 2 mm/h (ref 0–40)

## 2017-09-25 LAB — VITAMIN B12: VITAMIN B 12: 600 pg/mL (ref 232–1245)

## 2017-09-25 LAB — MAGNESIUM: Magnesium: 1.9 mg/dL (ref 1.6–2.3)

## 2017-09-25 LAB — BRAIN NATRIURETIC PEPTIDE: BNP: 145.8 pg/mL — ABNORMAL HIGH (ref 0.0–100.0)

## 2017-09-26 LAB — URINE CULTURE

## 2017-10-01 DIAGNOSIS — L720 Epidermal cyst: Secondary | ICD-10-CM | POA: Diagnosis not present

## 2017-10-01 DIAGNOSIS — D2339 Other benign neoplasm of skin of other parts of face: Secondary | ICD-10-CM | POA: Diagnosis not present

## 2017-10-01 DIAGNOSIS — C4441 Basal cell carcinoma of skin of scalp and neck: Secondary | ICD-10-CM | POA: Diagnosis not present

## 2017-10-07 ENCOUNTER — Telehealth: Payer: Self-pay | Admitting: *Deleted

## 2017-10-07 MED ORDER — METOPROLOL SUCCINATE ER 25 MG PO TB24: 25.0000 mg | ORAL_TABLET | Freq: Two times a day (BID) | ORAL | 3 refills | Status: DC

## 2017-10-07 MED ORDER — HYDROCHLOROTHIAZIDE 25 MG PO TABS: 25.0000 mg | ORAL_TABLET | Freq: Every day | ORAL | 3 refills | Status: DC

## 2017-10-07 NOTE — Telephone Encounter (Signed)
New doses of meds sent to pharm --- per [facility] and BP handout sheet with notes to be scanned in ---  LM 10/07/17.

## 2017-10-08 ENCOUNTER — Telehealth: Payer: Self-pay | Admitting: Family Medicine

## 2017-10-08 NOTE — Telephone Encounter (Signed)
Patient aware of recommendations. Please see telephone call from 11/1

## 2017-10-18 ENCOUNTER — Other Ambulatory Visit: Payer: Medicare Other

## 2017-10-18 DIAGNOSIS — Z1211 Encounter for screening for malignant neoplasm of colon: Secondary | ICD-10-CM | POA: Diagnosis not present

## 2017-10-20 LAB — FECAL OCCULT BLOOD, IMMUNOCHEMICAL: FECAL OCCULT BLD: NEGATIVE

## 2017-10-22 ENCOUNTER — Encounter: Payer: Self-pay | Admitting: Family Medicine

## 2017-10-22 ENCOUNTER — Ambulatory Visit (INDEPENDENT_AMBULATORY_CARE_PROVIDER_SITE_OTHER): Payer: Medicare Other | Admitting: Family Medicine

## 2017-10-22 VITALS — BP 136/80 | HR 73 | Temp 96.7°F | Ht 62.0 in | Wt 163.0 lb

## 2017-10-22 DIAGNOSIS — I700 Atherosclerosis of aorta: Secondary | ICD-10-CM | POA: Diagnosis not present

## 2017-10-22 DIAGNOSIS — D473 Essential (hemorrhagic) thrombocythemia: Secondary | ICD-10-CM | POA: Diagnosis not present

## 2017-10-22 DIAGNOSIS — I480 Paroxysmal atrial fibrillation: Secondary | ICD-10-CM | POA: Diagnosis not present

## 2017-10-22 DIAGNOSIS — I10 Essential (primary) hypertension: Secondary | ICD-10-CM

## 2017-10-22 DIAGNOSIS — N183 Chronic kidney disease, stage 3 (moderate): Secondary | ICD-10-CM | POA: Diagnosis not present

## 2017-10-22 DIAGNOSIS — D75839 Thrombocytosis, unspecified: Secondary | ICD-10-CM

## 2017-10-22 DIAGNOSIS — N1832 Chronic kidney disease, stage 3b: Secondary | ICD-10-CM

## 2017-10-22 LAB — BMP8+EGFR
BUN/Creatinine Ratio: 16 (ref 12–28)
BUN: 17 mg/dL (ref 8–27)
CALCIUM: 9.5 mg/dL (ref 8.7–10.3)
CHLORIDE: 103 mmol/L (ref 96–106)
CO2: 26 mmol/L (ref 20–29)
Creatinine, Ser: 1.07 mg/dL — ABNORMAL HIGH (ref 0.57–1.00)
GFR calc Af Amer: 54 mL/min/{1.73_m2} — ABNORMAL LOW (ref >59)
GFR calc non Af Amer: 47 mL/min/{1.73_m2} — ABNORMAL LOW (ref >59)
GLUCOSE: 112 mg/dL — AB (ref 65–99)
POTASSIUM: 4.6 mmol/L (ref 3.5–5.2)
Sodium: 142 mmol/L (ref 134–144)

## 2017-10-22 NOTE — Patient Instructions (Signed)
Continue with current treatment Check blood pressures periodically at home and bring these readings to the next visit Avoid NSAIDs as you are already doing Watch sodium intake Compare wrist blood pressure monitor to our monitor when possible and bring wrist monitor by to see how it compares to readings in our office

## 2017-10-22 NOTE — Progress Notes (Signed)
 Subjective:    Patient ID: Kristy [name], female    DOB: 08/06/1928, 83 y.o.   MRN: [id number]  HPI Pt here for 4 week follow up on fatigue and elevated BP.  The patient comes back today as indicated for follow-up of fatigue and elevated blood pressure.  She brings in outside readings for review and the majority of the readings are running between the 130s up to the low 140s.  The reading by the nurse today on initial rooming was 96/58.  Lab work was reviewed.  Thyroid test cholesterol numbers sed rate magnesium level were all good including the urinalysis.  A BNP was slightly elevated at 145.8.  She has taken some extra Lasix on Monday Wednesday and Friday.  She has been taking metoprolol 25 mg twice a day and a whole HCTZ 25 once a day.  Patient is feeling better.  She did go see the dermatologist about the lesion on her head and it did turn out to be a basal cell carcinoma.  She did bring in blood pressure readings for review and pulse rates.  Recent lab work was reviewed with the patient.  She does have stage IIIb chronic kidney disease.    Patient Active Problem List   Diagnosis Date Noted  . Tick bite 05/10/2017  . Thoracic aortic atherosclerosis (HCC) 11/06/2016  . Metabolic syndrome 07/07/2016  . 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 Essential thrombocytosis (HCC) 01/24/2016  . PAF (paroxysmal atrial fibrillation) (HCC) 12/18/2013  . Cervical spondylosis without myelopathy 04/27/2013  . ABDOMINAL PAIN, UNSPECIFIED SITE 02/02/2011  . Palpitations 02/26/2010  . COLONIC POLYPS, HYPERPLASTIC 12/07/2007  . LIPOMA 12/07/2007  . OBESITY 12/07/2007  . Essential hypertension 12/07/2007  . GERD 12/07/2007  . HIATAL HERNIA 12/07/2007  . DIVERTICULOSIS, COLON 12/07/2007  . FATTY LIVER DISEASE 12/07/2007  . ENDOMETRIAL POLYP 12/07/2007  . COMPLEX ENDOMETRIAL HYPERPLASIA WITHOUT ATYPIA 12/07/2007  . SOB 12/07/2007   Outpatient Encounter Medications as of 10/22/2017  Medication Sig  .  ALPRAZolam (XANAX) 0.25 MG tablet Take 1 tablet (0.25 mg total) by mouth daily as needed for anxiety.  . Cholecalciferol (VITAMIN D3) 2000 UNITS TABS Take by mouth daily.  . estradiol (CLIMARA - DOSED IN MG/24 HR) 0.025 mg/24hr patch APPLY 1 PATCH ONCE A WEEK. REMOVE OLD PATCH EACH WEEK  . glucose blood (ONE TOUCH ULTRA TEST) test strip Test once qd. DX E11.9  . hydrochlorothiazide (HYDRODIURIL) 25 MG tablet Take 1 tablet (25 mg total) by mouth daily.  . hydroxyurea (HYDREA) 500 MG capsule On Monday & Thursday  . hyoscyamine (LEVSIN SL) 0.125 MG SL tablet Place 1 tablet (0.125 mg total) under the tongue every 6 (six) hours as needed.  . metoprolol succinate (TOPROL-XL) 25 MG 24 hr tablet Take 1 tablet (25 mg total) by mouth 2 (two) times daily.  . Multiple Vitamin (MULTIVITAMIN) tablet Take 1 tablet by mouth daily.    . omeprazole (PRILOSEC) 20 MG capsule TAKE (1) CAPSULE DAILY  . Polyethyl Glycol-Propyl Glycol (SYSTANE OP) Apply 1-2 drops to eye 4 (four) times daily as needed (dry eyes).  . progesterone (PROMETRIUM) 200 MG capsule TAKE 12 CAPSULES EVERY FOUR MONTHS AS DIRECTED (DAYS 1 THROUGH 12)  . sertraline (ZOLOFT) 50 MG tablet TAKE 1 & 1/2 TABLETS ONCE DAILY  . XARELTO 15 MG TABS tablet TAKE 1 TABLET (15 MG TOTAL) BY MOUTH DAILY.   No facility-administered encounter medications on file as of 10/22/2017.       Review of Systems    Constitutional: Negative.   HENT: Negative.   Eyes: Negative.   Respiratory: Negative.   Cardiovascular: Negative.   Gastrointestinal: Negative.   Endocrine: Negative.   Genitourinary: Negative.   Musculoskeletal: Negative.   Skin: Negative.   Allergic/Immunologic: Negative.   Neurological: Negative.   Hematological: Negative.   Psychiatric/Behavioral: Negative.        Objective:   Physical Exam  Constitutional: She is oriented to person, place, and time. She appears well-developed and well-nourished. No distress.  HENT:  Head: Normocephalic.    The patient has had a basal cell skin cancer removed from her superior scalp and the area appears to be healing well she does not go back to see the dermatologist for 3-4 months.  Eyes: Conjunctivae and EOM are normal. Pupils are equal, round, and reactive to light. Right eye exhibits no discharge. Left eye exhibits no discharge. No scleral icterus.  Neck: Normal range of motion. Neck supple.  Cardiovascular: Normal rate, regular rhythm and normal heart sounds.  No murmur heard. The heart is regular at 72/min  Pulmonary/Chest: Effort normal and breath sounds normal. No respiratory distress. She has no wheezes. She has no rales.  Musculoskeletal: Normal range of motion. She exhibits edema.  Minimal pretibial edema and wearing support hose has helped this.  The patient's weight is stable compared to the last visit.  Neurological: She is alert and oriented to person, place, and time. She has normal reflexes. No cranial nerve deficit.  Skin: Skin is warm and dry. No rash noted.  Basal cell carcinoma removed from superior scalp  Psychiatric: She has a normal mood and affect. Her behavior is normal. Judgment and thought content normal.  Nursing note and vitals reviewed.  BP (!) 96/58 (BP Location: Left Arm)   Pulse 73   Temp (!) 96.7 F (35.9 C) (Oral)   Ht 5' 2" (1.575 m)   Wt 163 lb (73.9 kg)   BMI 29.81 kg/m    Repeat blood pressure today with patient sitting using a manual cuff was 136/80.     Assessment & Plan:  1. Essential hypertension -Blood pressure has improved and her home readings are improved.  The patient is feeling better. - BMP8+EGFR  2. Paroxysmal atrial fibrillation (HCC) -The patient was in normal sinus rhythm today and will order Xarelto.  3. Thrombocytosis ([place]) -The patient will continue with her current follow-ups with the hematologist.  4. Thoracic aortic atherosclerosis ([place]) -Continue with aggressive therapeutic lifestyle changes.  5. Chronic kidney  disease (CKD) stage G3b/A2, moderately decreased glomerular filtration rate (GFR) between 30-44 mL/min/1.73 square meter and albuminuria creatinine ratio between 30-299 mg/g (HCC) -Continue with current therapy keep blood pressure under good control avoid NSAIDs and check BMP today.  No orders of the defined types were placed in this encounter.  Patient Instructions  Continue with current treatment Check blood pressures periodically at home and bring these readings to the next visit Avoid NSAIDs as you are already doing Watch sodium intake Compare wrist blood pressure monitor to our monitor when possible and bring wrist monitor by to see how it compares to readings in our office  Arrie Senate MD   .

## 2017-10-26 DIAGNOSIS — H401233 Low-tension glaucoma, bilateral, severe stage: Secondary | ICD-10-CM | POA: Diagnosis not present

## 2017-10-26 DIAGNOSIS — H47013 Ischemic optic neuropathy, bilateral: Secondary | ICD-10-CM | POA: Diagnosis not present

## 2017-11-01 ENCOUNTER — Other Ambulatory Visit: Payer: Self-pay | Admitting: Family Medicine

## 2017-11-09 ENCOUNTER — Other Ambulatory Visit: Payer: Self-pay | Admitting: Family Medicine

## 2017-11-10 ENCOUNTER — Other Ambulatory Visit: Payer: Self-pay | Admitting: Family Medicine

## 2017-12-16 ENCOUNTER — Encounter (HOSPITAL_COMMUNITY): Payer: Self-pay | Admitting: Emergency Medicine

## 2017-12-16 ENCOUNTER — Other Ambulatory Visit (HOSPITAL_COMMUNITY): Payer: Self-pay | Admitting: Emergency Medicine

## 2017-12-16 ENCOUNTER — Telehealth (HOSPITAL_COMMUNITY): Payer: Self-pay | Admitting: Emergency Medicine

## 2017-12-16 ENCOUNTER — Telehealth: Payer: Self-pay | Admitting: Family Medicine

## 2017-12-16 NOTE — Telephone Encounter (Signed)
Letter sent and faxed to Dr Laurance Flatten at [facility] that Kristy [name] would like her lab work that we have ordered drawn at her next appointment at [facility].  Orders are in Epic for CBC/diff and CMET.

## 2017-12-16 NOTE — Telephone Encounter (Signed)
Called pt to see if she would be ok with seeing another provider if I couldn't get her in any sooner with [facility] and she said she was ok with the appt she had and didn't need to be seen any sooner.

## 2017-12-29 ENCOUNTER — Other Ambulatory Visit (HOSPITAL_COMMUNITY): Payer: Medicare Other

## 2017-12-29 ENCOUNTER — Ambulatory Visit (INDEPENDENT_AMBULATORY_CARE_PROVIDER_SITE_OTHER): Payer: Medicare Other | Admitting: Family Medicine

## 2017-12-29 ENCOUNTER — Encounter: Payer: Self-pay | Admitting: Family Medicine

## 2017-12-29 VITALS — BP 137/59 | HR 57 | Temp 97.2°F | Ht 62.0 in | Wt 162.0 lb

## 2017-12-29 DIAGNOSIS — I700 Atherosclerosis of aorta: Secondary | ICD-10-CM | POA: Diagnosis not present

## 2017-12-29 DIAGNOSIS — I10 Essential (primary) hypertension: Secondary | ICD-10-CM | POA: Diagnosis not present

## 2017-12-29 DIAGNOSIS — N183 Chronic kidney disease, stage 3 (moderate): Secondary | ICD-10-CM

## 2017-12-29 DIAGNOSIS — I480 Paroxysmal atrial fibrillation: Secondary | ICD-10-CM

## 2017-12-29 DIAGNOSIS — D473 Essential (hemorrhagic) thrombocythemia: Secondary | ICD-10-CM | POA: Diagnosis not present

## 2017-12-29 DIAGNOSIS — Z1589 Genetic susceptibility to other disease: Secondary | ICD-10-CM | POA: Diagnosis not present

## 2017-12-29 DIAGNOSIS — D75839 Thrombocytosis, unspecified: Secondary | ICD-10-CM

## 2017-12-29 DIAGNOSIS — N1832 Chronic kidney disease, stage 3b: Secondary | ICD-10-CM

## 2017-12-29 NOTE — Progress Notes (Signed)
Subjective:    Patient ID: Kristy [name], female    DOB: 08-28-1928, 84 y.o.   MRN: [id number]  HPI Pt here for eight week follow up on HTN and fatigue.  The patient wants to talk about her Xanax.  She thinks she needs to take a whole one instead of a half or one or would consider increasing the BuSpar.  She only takes Xanax once a day and in the morning time.  She is only taking half of a point to 5 and only if needed.  We will change the directions letter take 1/2-1 daily as needed.  If for some reason we have to increase this medicine further we will change to BuSpar.  The patient continues to see the hematologist on a regular basis.  These visits are because of her thrombocytosis.  The patient in addition to seeing the hematologist also does see the cardiologist periodically.  She has a large hiatal hernia.  She is definitely been more depressed and had lack of energy this winter and is still mourning the loss of her husband.  She does have more shortness of breath than usual but has not been very active.  She denies any trouble with her stomach including nausea vomiting diarrhea blood in the stool or black tarry bowel movements.  She does take her omeprazole regularly and this keeps her from having heartburn.  She does also have some occasional bright red blood from a hemorrhoid with her bowel movements.  She is passing her water without problems.   Patient Active Problem List   Diagnosis Date Noted  . Tick bite 05/10/2017  . Thoracic aortic atherosclerosis ([place]) 11/06/2016  . Metabolic syndrome 26/71/2458  . 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 Essential thrombocytosis ([place]) 01/24/2016  . PAF (paroxysmal atrial fibrillation) ([place]) 12/18/2013  . Cervical spondylosis without myelopathy 04/27/2013  . ABDOMINAL PAIN, UNSPECIFIED SITE 02/02/2011  . Palpitations 02/26/2010  . COLONIC POLYPS, HYPERPLASTIC 12/07/2007  . LIPOMA 12/07/2007  . OBESITY 12/07/2007  . Essential hypertension 12/07/2007    . GERD 12/07/2007  . HIATAL HERNIA 12/07/2007  . DIVERTICULOSIS, COLON 12/07/2007  . FATTY LIVER DISEASE 12/07/2007  . ENDOMETRIAL POLYP 12/07/2007  . COMPLEX ENDOMETRIAL HYPERPLASIA WITHOUT ATYPIA 12/07/2007  . SOB 12/07/2007   Outpatient Encounter Medications as of 12/29/2017  Medication Sig  . ALPRAZolam (XANAX) 0.25 MG tablet Take 1 tablet (0.25 mg total) by mouth daily as needed for anxiety.  . Cholecalciferol (VITAMIN D3) 2000 UNITS TABS Take by mouth daily.  Marland Kitchen estradiol (CLIMARA - DOSED IN MG/24 HR) 0.025 mg/24hr patch APPLY 1 PATCH ONCE A WEEK. REMOVE OLD PATCH EACH WEEK  . glucose blood (ONE TOUCH ULTRA TEST) test strip Test once qd. DX E11.9  . hydrochlorothiazide (HYDRODIURIL) 25 MG tablet Take 1 tablet (25 mg total) by mouth daily.  . hydroxyurea (HYDREA) 500 MG capsule On Monday & Thursday  . hyoscyamine (LEVSIN SL) 0.125 MG SL tablet Place 1 tablet (0.125 mg total) under the tongue every 6 (six) hours as needed.  . metoprolol succinate (TOPROL-XL) 25 MG 24 hr tablet Take 1 tablet (25 mg total) by mouth 2 (two) times daily.  . Multiple Vitamin (MULTIVITAMIN) tablet Take 1 tablet by mouth daily.    Marland Kitchen omeprazole (PRILOSEC) 20 MG capsule TAKE (1) CAPSULE DAILY  . Polyethyl Glycol-Propyl Glycol (SYSTANE OP) Apply 1-2 drops to eye 4 (four) times daily as needed (dry eyes).  . progesterone (PROMETRIUM) 200 MG capsule TAKE 12 CAPSULES EVERY FOUR MONTHS  AS DIRECTED (DAYS 1 THROUGH 12)  . sertraline (ZOLOFT) 50 MG tablet TAKE 1 & 1/2 TABLETS ONCE DAILY  . timolol (TIMOPTIC) 0.5 % ophthalmic solution   . XARELTO 15 MG TABS tablet TAKE 1 TABLET (15 MG TOTAL) BY MOUTH DAILY.   No facility-administered encounter medications on file as of 12/29/2017.       Review of Systems  Constitutional: Positive for fatigue.  HENT: Negative.   Eyes: Negative.   Respiratory: Negative.   Cardiovascular: Negative.   Gastrointestinal: Negative.   Endocrine: Negative.   Genitourinary: Negative.    Musculoskeletal: Negative.   Skin: Negative.   Allergic/Immunologic: Negative.   Neurological: Negative.   Hematological: Negative.   Psychiatric/Behavioral: The patient is nervous/anxious.        Objective:   Physical Exam  Constitutional: She is oriented to person, place, and time. She appears well-developed and well-nourished.  Patient is somewhat tearful because of talking of her husband.  She certainly needs to continue with her antidepressant medicine.  We will make sure there is no problem with this and with her glaucoma.  HENT:  Right Ear: External ear normal.  Left Ear: External ear normal.  Nose: Nose normal.  Mouth/Throat: Oropharynx is clear and moist. No oropharyngeal exudate.  Eyes: Conjunctivae and EOM are normal. Pupils are equal, round, and reactive to light. Right eye exhibits no discharge. Left eye exhibits no discharge. No scleral icterus.  Neck: Normal range of motion. Neck supple. No thyromegaly present.  Bilateral hearing aids and cerumen bilaterally.  Patient will discuss this with her audiologist to have the wax removed.  Cardiovascular: Normal rate, regular rhythm, normal heart sounds and intact distal pulses.  No murmur heard. Heart is regular today at 72/min even though she has a history of atrial fibrillation.  Pulmonary/Chest: Effort normal and breath sounds normal. No respiratory distress. She has no wheezes. She has no rales.  Clear anteriorly and posteriorly  Abdominal: Soft. Bowel sounds are normal. She exhibits no mass. There is no tenderness. There is no rebound and no guarding.  There is no organ enlargement bruits or masses or inguinal adenopathy.  Musculoskeletal: Normal range of motion. She exhibits no edema.  Good range of motion of all extremities with minimal pedal edema  Lymphadenopathy:    She has no cervical adenopathy.  Neurological: She is alert and oriented to person, place, and time. She has normal reflexes. No cranial nerve deficit.   Skin: Skin is warm and dry. No rash noted.  Psychiatric: She has a normal mood and affect. Her behavior is normal. Judgment and thought content normal.  Nursing note and vitals reviewed.   BP (!) 137/59 (BP Location: Left Arm)   Pulse (!) 57   Temp (!) 97.2 F (36.2 C) (Oral)   Ht 5\' 2"  (1.575 m)   Wt 162 lb (73.5 kg)   BMI 29.63 kg/m        Assessment & Plan:  1. Thoracic aortic atherosclerosis ([place]) -Continue with aggressive therapeutic lifestyle changes  2. Essential hypertension -Continue with current blood pressure treatment and sodium restriction  3. Paroxysmal atrial fibrillation (HCC) -Continue follow-up with cardiology and current blood thinner  4. Thrombocytosis ([place]) -Follow-up with hematology as planned  5. Chronic kidney disease (CKD) stage G3b/A2, moderately decreased glomerular filtration rate (GFR) between 30-44 mL/min/1.73 square meter and albuminuria creatinine ratio between 30-299 mg/g (HCC) -Keep blood pressure under good control and watch sodium intake  6. JAK2 V617F mutation -Follow-up with hematology as planned  Patient Instructions  The patient will continue with her Xanax as she is currently doing 1/2-1 daily If we have to add additional medication we will talk about adding BuSpar She needs to make every effort to get out and be active and get involved in the community Physically she needs increased physical activity with walking She should continue to drink as much fluids as possible and stay well-hydrated She should continue to be as careful as she can to prevent from falling and moves slowly. She should follow-up with hematology as planned  Kristy Senate MD

## 2017-12-29 NOTE — Addendum Note (Signed)
Addended by: Zannie Cove on: 12/29/2017 12:09 PM   Modules accepted: Orders

## 2017-12-29 NOTE — Patient Instructions (Signed)
The patient will continue with her Xanax as she is currently doing 1/2-1 daily If we have to add additional medication we will talk about adding BuSpar She needs to make every effort to get out and be active and get involved in the community Physically she needs increased physical activity with walking She should continue to drink as much fluids as possible and stay well-hydrated She should continue to be as careful as she can to prevent from falling and moves slowly. She should follow-up with hematology as planned

## 2017-12-30 LAB — CBC WITH DIFFERENTIAL/PLATELET
Basophils Absolute: 0 10*3/uL (ref 0.0–0.2)
Basos: 0 %
EOS (ABSOLUTE): 0.1 10*3/uL (ref 0.0–0.4)
EOS: 1 %
HEMATOCRIT: 38.2 % (ref 34.0–46.6)
HEMOGLOBIN: 12.3 g/dL (ref 11.1–15.9)
IMMATURE GRANS (ABS): 0 10*3/uL (ref 0.0–0.1)
Immature Granulocytes: 0 %
LYMPHS: 21 %
Lymphocytes Absolute: 1.4 10*3/uL (ref 0.7–3.1)
MCH: 30.5 pg (ref 26.6–33.0)
MCHC: 32.2 g/dL (ref 31.5–35.7)
MCV: 95 fL (ref 79–97)
MONOCYTES: 8 %
Monocytes Absolute: 0.5 10*3/uL (ref 0.1–0.9)
NEUTROS ABS: 4.7 10*3/uL (ref 1.4–7.0)
Neutrophils: 70 %
Platelets: 381 10*3/uL — ABNORMAL HIGH (ref 150–379)
RBC: 4.03 x10E6/uL (ref 3.77–5.28)
RDW: 14.9 % (ref 12.3–15.4)
WBC: 6.7 10*3/uL (ref 3.4–10.8)

## 2017-12-30 LAB — CMP14+EGFR
ALT: 15 IU/L (ref 0–32)
AST: 22 IU/L (ref 0–40)
Albumin/Globulin Ratio: 2 (ref 1.2–2.2)
Albumin: 4.5 g/dL (ref 3.5–4.7)
Alkaline Phosphatase: 66 IU/L (ref 39–117)
BUN/Creatinine Ratio: 16 (ref 12–28)
BUN: 16 mg/dL (ref 8–27)
Bilirubin Total: 0.4 mg/dL (ref 0.0–1.2)
CALCIUM: 9.7 mg/dL (ref 8.7–10.3)
CO2: 25 mmol/L (ref 20–29)
CREATININE: 1.02 mg/dL — AB (ref 0.57–1.00)
Chloride: 100 mmol/L (ref 96–106)
GFR calc Af Amer: 57 mL/min/{1.73_m2} — ABNORMAL LOW (ref >59)
GFR, EST NON AFRICAN AMERICAN: 50 mL/min/{1.73_m2} — AB (ref >59)
GLUCOSE: 107 mg/dL — AB (ref 65–99)
Globulin, Total: 2.2 g/dL (ref 1.5–4.5)
Potassium: 4.2 mmol/L (ref 3.5–5.2)
Sodium: 141 mmol/L (ref 134–144)
Total Protein: 6.7 g/dL (ref 6.0–8.5)

## 2017-12-30 LAB — BRAIN NATRIURETIC PEPTIDE: BNP: 141.5 pg/mL — ABNORMAL HIGH (ref 0.0–100.0)

## 2018-01-03 ENCOUNTER — Other Ambulatory Visit: Payer: Self-pay | Admitting: Family Medicine

## 2018-01-05 ENCOUNTER — Encounter (HOSPITAL_COMMUNITY): Payer: Self-pay | Admitting: Internal Medicine

## 2018-01-05 ENCOUNTER — Inpatient Hospital Stay (HOSPITAL_COMMUNITY): Payer: Medicare Other | Attending: Internal Medicine | Admitting: Internal Medicine

## 2018-01-05 VITALS — BP 126/75 | HR 60 | Temp 98.1°F | Resp 18 | Wt 163.8 lb

## 2018-01-05 DIAGNOSIS — I480 Paroxysmal atrial fibrillation: Secondary | ICD-10-CM | POA: Insufficient documentation

## 2018-01-05 DIAGNOSIS — Z7901 Long term (current) use of anticoagulants: Secondary | ICD-10-CM | POA: Diagnosis not present

## 2018-01-05 DIAGNOSIS — D473 Essential (hemorrhagic) thrombocythemia: Secondary | ICD-10-CM | POA: Diagnosis not present

## 2018-01-05 NOTE — Patient Instructions (Signed)
[facility]  Discharge Instructions:  You were seen by dr. peru _______________________________________________________________  Thank you for choosing [facility] at [facility] to provide your oncology and hematology care.  To afford each patient quality time with our providers, please arrive at least 15 minutes before your scheduled appointment.  You need to re-schedule your appointment if you arrive 10 or more minutes late.  We strive to give you quality time with our providers, and arriving late affects you and other patients whose appointments are after yours.  Also, if you no show three or more times for appointments you may be dismissed from the clinic.  Again, thank you for choosing [facility] at [facility]. Our hope is that these requests will allow you access to exceptional care and in a timely manner. _______________________________________________________________  If you have questions after your visit, please contact our office at [phone] between the hours of 8:30 a.m. and 5:00 p.m. Voicemails left after 4:30 p.m. will not be returned until the following business day. _______________________________________________________________  For prescription refill requests, have your pharmacy contact our office. _______________________________________________________________  Recommendations made by the consultant and any test results will be sent to your referring physician. _______________________________________________________________ 

## 2018-01-05 NOTE — Progress Notes (Signed)
Chief complaint: [name] returns today for follow-up for Jak 2+ essential thrombocytosis.  Patient is on Hydrea 500 mg on Monday and 500 mg on Thursdays.    HPI: She has been doing well with no excess fatigue noted thrombotic episodes no bleeding easy bruising her weight is stable appetite is normal.  She did have [facility] spotted fever last year from which she has returned back to normal she took doxycycline for about 4 weeks.  She denies any chest pain or shortness of breath at this time.    Her CBC shows hemoglobin at 12.3 platelets 381 WBC 6.7.  Blood pressure 126/75, pulse 60, temperature 98.1 F (36.7 C), temperature source Oral, resp. rate 18, weight 163 lb 12.8 oz (74.3 kg), SpO2 95 %. Physical Exam  Constitutional: She is oriented to person, place, and time. She appears well-developed and well-nourished.  HENT:  Head: Normocephalic and atraumatic.  Eyes: Conjunctivae are normal. Pupils are equal, round, and reactive to light.  Neck: Normal range of motion. Neck supple.  Cardiovascular: Normal rate and regular rhythm.  Pulmonary/Chest: Effort normal.  Abdominal: Soft. Bowel sounds are normal. She exhibits no distension and no mass. There is no tenderness.  Musculoskeletal: Normal range of motion.  Lymphadenopathy:    She has no cervical adenopathy.  Neurological: She is alert and oriented to person, place, and time.  Psychiatric: She has a normal mood and affect.  Nursing note and vitals reviewed.   Impression and Plan: Essential thrombocytosis Jak 2+ tolerating Hydrea very well platelet count is stable continue Hydrea 500 mg on Monday and 5 mg on Thursday.   Patient will return for follow-up in 6 months she will continue    She is on Xarelto once daily for paroxysmal atrial fibrillation she will continue the same.

## 2018-01-26 ENCOUNTER — Other Ambulatory Visit: Payer: Self-pay | Admitting: Family Medicine

## 2018-01-28 ENCOUNTER — Other Ambulatory Visit: Payer: Self-pay | Admitting: *Deleted

## 2018-02-02 ENCOUNTER — Other Ambulatory Visit: Payer: Self-pay | Admitting: Family Medicine

## 2018-02-03 DIAGNOSIS — Z85828 Personal history of other malignant neoplasm of skin: Secondary | ICD-10-CM | POA: Diagnosis not present

## 2018-02-03 DIAGNOSIS — L720 Epidermal cyst: Secondary | ICD-10-CM | POA: Diagnosis not present

## 2018-02-03 DIAGNOSIS — M71341 Other bursal cyst, right hand: Secondary | ICD-10-CM | POA: Diagnosis not present

## 2018-02-10 ENCOUNTER — Other Ambulatory Visit: Payer: Self-pay | Admitting: Family Medicine

## 2018-02-11 ENCOUNTER — Ambulatory Visit: Payer: Medicare Other | Admitting: Family Medicine

## 2018-02-15 ENCOUNTER — Other Ambulatory Visit (HOSPITAL_COMMUNITY): Payer: Self-pay | Admitting: *Deleted

## 2018-02-15 DIAGNOSIS — D473 Essential (hemorrhagic) thrombocythemia: Secondary | ICD-10-CM

## 2018-02-15 MED ORDER — HYDROXYUREA 500 MG PO CAPS
500.0000 mg | ORAL_CAPSULE | Freq: Every day | ORAL | 1 refills | Status: DC
Start: 2018-02-15 — End: 2018-06-02

## 2018-02-15 NOTE — Telephone Encounter (Signed)
Patient called for refill on hydrea. Reviewed with provider, chart checked, and refilled.

## 2018-02-21 ENCOUNTER — Other Ambulatory Visit: Payer: Self-pay | Admitting: Family Medicine

## 2018-02-24 ENCOUNTER — Other Ambulatory Visit: Payer: Self-pay | Admitting: *Deleted

## 2018-02-24 ENCOUNTER — Other Ambulatory Visit: Payer: Self-pay | Admitting: Family Medicine

## 2018-02-24 MED ORDER — ESTRADIOL 0.025 MG/24HR TD PTWK: MEDICATED_PATCH | TRANSDERMAL | 0 refills | Status: DC

## 2018-02-24 MED ORDER — OMEPRAZOLE 20 MG PO CPDR: DELAYED_RELEASE_CAPSULE | ORAL | 1 refills | Status: DC

## 2018-02-24 MED ORDER — METOPROLOL SUCCINATE ER 25 MG PO TB24: 25.0000 mg | ORAL_TABLET | Freq: Two times a day (BID) | ORAL | 1 refills | Status: DC

## 2018-02-24 MED ORDER — HYDROCHLOROTHIAZIDE 25 MG PO TABS: 25.0000 mg | ORAL_TABLET | Freq: Every day | ORAL | 1 refills | Status: DC

## 2018-02-24 MED ORDER — RIVAROXABAN 15 MG PO TABS: ORAL_TABLET | ORAL | 0 refills | Status: DC

## 2018-02-24 MED ORDER — SERTRALINE HCL 50 MG PO TABS: ORAL_TABLET | ORAL | 0 refills | Status: DC

## 2018-03-14 ENCOUNTER — Ambulatory Visit (INDEPENDENT_AMBULATORY_CARE_PROVIDER_SITE_OTHER): Payer: Medicare Other | Admitting: Family Medicine

## 2018-03-14 ENCOUNTER — Encounter: Payer: Self-pay | Admitting: Family Medicine

## 2018-03-14 VITALS — BP 133/55 | HR 54 | Temp 97.2°F | Ht 62.0 in | Wt 165.0 lb

## 2018-03-14 DIAGNOSIS — K219 Gastro-esophageal reflux disease without esophagitis: Secondary | ICD-10-CM

## 2018-03-14 DIAGNOSIS — N183 Chronic kidney disease, stage 3 unspecified: Secondary | ICD-10-CM

## 2018-03-14 DIAGNOSIS — D75839 Thrombocytosis, unspecified: Secondary | ICD-10-CM

## 2018-03-14 DIAGNOSIS — I129 Hypertensive chronic kidney disease with stage 1 through stage 4 chronic kidney disease, or unspecified chronic kidney disease: Secondary | ICD-10-CM

## 2018-03-14 DIAGNOSIS — I480 Paroxysmal atrial fibrillation: Secondary | ICD-10-CM

## 2018-03-14 DIAGNOSIS — I700 Atherosclerosis of aorta: Secondary | ICD-10-CM

## 2018-03-14 DIAGNOSIS — E8881 Metabolic syndrome: Secondary | ICD-10-CM | POA: Diagnosis not present

## 2018-03-14 DIAGNOSIS — Z1589 Genetic susceptibility to other disease: Secondary | ICD-10-CM | POA: Diagnosis not present

## 2018-03-14 DIAGNOSIS — N1832 Chronic kidney disease, stage 3b: Secondary | ICD-10-CM

## 2018-03-14 DIAGNOSIS — E559 Vitamin D deficiency, unspecified: Secondary | ICD-10-CM

## 2018-03-14 DIAGNOSIS — D473 Essential (hemorrhagic) thrombocythemia: Secondary | ICD-10-CM

## 2018-03-14 DIAGNOSIS — R7309 Other abnormal glucose: Secondary | ICD-10-CM | POA: Diagnosis not present

## 2018-03-14 DIAGNOSIS — I10 Essential (primary) hypertension: Secondary | ICD-10-CM | POA: Diagnosis not present

## 2018-03-14 LAB — BAYER DCA HB A1C WAIVED: HB A1C: 5.5 % (ref <7.0)

## 2018-03-14 MED ORDER — ALPRAZOLAM 0.25 MG PO TABS: 0.2500 mg | ORAL_TABLET | Freq: Every day | ORAL | 3 refills | Status: DC | PRN

## 2018-03-14 NOTE — Progress Notes (Signed)
Subjective:    Patient ID: Kristy [name], female    DOB: 1928-04-11, 84 y.o.   MRN: [id number]  HPI Pt here for follow up and management of chronic medical problems which includes a fib, hypertension and gerd. She is taking medication regularly.  The patient is doing well and feels great other than ongoing fatigue.  She is not sure where this is coming from still to some extent blames this on the tick bite that she had and a positive test for [facility] spotted fever.  She denies any chest pain or shortness of breath.  She does have ongoing problems with blood loss from a hemorrhoid which is concerning to her because of all the cleansing that this requires.  She denies any trouble with swallowing heartburn indigestion nausea vomiting or diarrhea.  She is passing her water regularly but frequently.  She plans to go back and see the hematologist for her thrombocytosis and she also plans to go see the surgeon, Dr. Lucia Gaskins because of this ongoing problem with a hemorrhoid.  We told her that if she had any problems making appointments to get back in touch with [place] and we would help her with this.    Patient Active Problem List   Diagnosis Date Noted  . Tick bite 05/10/2017  . Thoracic aortic atherosclerosis ([place]) 11/06/2016  . Metabolic syndrome 08/29/3006  . 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 Essential thrombocytosis ([place]) 01/24/2016  . PAF (paroxysmal atrial fibrillation) ([place]) 12/18/2013  . Cervical spondylosis without myelopathy 04/27/2013  . ABDOMINAL PAIN, UNSPECIFIED SITE 02/02/2011  . Palpitations 02/26/2010  . COLONIC POLYPS, HYPERPLASTIC 12/07/2007  . LIPOMA 12/07/2007  . OBESITY 12/07/2007  . Essential hypertension 12/07/2007  . GERD 12/07/2007  . HIATAL HERNIA 12/07/2007  . DIVERTICULOSIS, COLON 12/07/2007  . FATTY LIVER DISEASE 12/07/2007  . ENDOMETRIAL POLYP 12/07/2007  . COMPLEX ENDOMETRIAL HYPERPLASIA WITHOUT ATYPIA 12/07/2007  . SOB 12/07/2007   Outpatient Encounter  Medications as of 03/14/2018  Medication Sig  . ALPRAZolam (XANAX) 0.25 MG tablet Take 1 tablet (0.25 mg total) by mouth daily as needed for anxiety.  . Cholecalciferol (VITAMIN D3) 2000 UNITS TABS Take by mouth daily.  Marland Kitchen estradiol (CLIMARA - DOSED IN MG/24 HR) 0.025 mg/24hr patch APPLY 1 PATCH ONCE A WEEK. REMOVE OLD PATCH EACH WEEK  . glucose blood (ONE TOUCH ULTRA TEST) test strip CHECK BLOOD SUGAR ONCE DAILY  . hydrochlorothiazide (HYDRODIURIL) 25 MG tablet Take 1 tablet (25 mg total) by mouth daily.  . hydroxyurea (HYDREA) 500 MG capsule Take 1 capsule (500 mg total) by mouth daily. On Monday & Thursday only  . metoprolol succinate (TOPROL-XL) 25 MG 24 hr tablet Take 1 tablet (25 mg total) by mouth 2 (two) times daily.  . Multiple Vitamin (MULTIVITAMIN) tablet Take 1 tablet by mouth daily.    Marland Kitchen omeprazole (PRILOSEC) 20 MG capsule TAKE (1) CAPSULE DAILY  . Polyethyl Glycol-Propyl Glycol (SYSTANE OP) Apply 1-2 drops to eye 4 (four) times daily as needed (dry eyes).  . Rivaroxaban (XARELTO) 15 MG TABS tablet TAKE 1 TABLET (15 MG TOTAL) BY MOUTH DAILY.  Marland Kitchen sertraline (ZOLOFT) 50 MG tablet TAKE 1 & 1/2 TABLETS ONCE DAILY (Patient taking differently: Take 50 mg by mouth daily. )  . timolol (TIMOPTIC) 0.5 % ophthalmic solution   . [DISCONTINUED] hyoscyamine (LEVSIN SL) 0.125 MG SL tablet Place 1 tablet (0.125 mg total) under the tongue every 6 (six) hours as needed.  . [DISCONTINUED] progesterone (PROMETRIUM) 200 MG capsule TAKE  12 CAPSULES EVERY FOUR MONTHS AS DIRECTED (DAYS 1 THROUGH 12)   No facility-administered encounter medications on file as of 03/14/2018.       Review of Systems  Constitutional: Negative.   HENT: Negative.   Eyes: Negative.   Respiratory: Negative.   Cardiovascular: Negative.   Gastrointestinal: Negative.   Endocrine: Negative.        Elevated BS   Genitourinary: Negative.   Musculoskeletal: Negative.   Skin: Negative.   Allergic/Immunologic: Negative.     Neurological: Negative.   Hematological: Negative.   Psychiatric/Behavioral: Negative.        Objective:   Physical Exam  Constitutional: She is oriented to person, place, and time. She appears well-developed and well-nourished. No distress.  The patient is pleasant and relaxed  HENT:  Head: Normocephalic and atraumatic.  Right Ear: External ear normal.  Left Ear: External ear normal.  Nose: Nose normal.  Mouth/Throat: Oropharynx is clear and moist. No oropharyngeal exudate.  Bilateral hearing aids with cerumen in each ear canal  Eyes: Pupils are equal, round, and reactive to light. Conjunctivae and EOM are normal. Right eye exhibits no discharge. Left eye exhibits no discharge. No scleral icterus.  Neck: Normal range of motion. Neck supple. No JVD present. No thyromegaly present.  No bruits thyromegaly or anterior cervical adenopathy  Cardiovascular: Normal rate, regular rhythm, normal heart sounds and intact distal pulses.  No murmur heard. The heart was slightly irregular at 84/min  Pulmonary/Chest: Effort normal and breath sounds normal. No respiratory distress. She has no wheezes. She has no rales.  Clear anteriorly and posteriorly  Abdominal: Soft. Bowel sounds are normal. She exhibits no mass. There is no tenderness. There is no rebound and no guarding.  No liver or spleen enlargement no epigastric tenderness no masses good inguinal pulses and no suprapubic tenderness  Musculoskeletal: Normal range of motion. She exhibits no edema or tenderness.  Lymphadenopathy:    She has no cervical adenopathy.  Neurological: She is alert and oriented to person, place, and time. She has normal reflexes. No cranial nerve deficit.  Skin: Skin is warm and dry. No rash noted.  Psychiatric: She has a normal mood and affect. Her behavior is normal. Judgment and thought content normal.  Nursing note and vitals reviewed.   BP (!) 133/55 (BP Location: Left Arm)   Pulse (!) 54   Temp (!) 97.2  F (36.2 C) (Oral)   Ht _0  (1.575 m)   Wt 165 lb (74.8 kg)   BMI 30.18 kg/m        Assessment & Plan:  1. Thoracic aortic atherosclerosis ([place]) -Continue with aggressive therapeutic lifestyle changes for cholesterol control and weight loss pending results of lab work - CBC with Differential/Platelet - Lipid panel  2. Essential hypertension -Blood pressure is good today and she will continue with current treatment - BMP8+EGFR - CBC with Differential/Platelet - Hepatic function panel  3. Paroxysmal atrial fibrillation (HCC) -The heart was slightly irregular today but it did not sound like atrial fib at 84/min - CBC with Differential/Platelet  4. Thrombocytosis ([place]) -Follow-up with hematology as planned - CBC with Differential/Platelet  5. Chronic kidney disease (CKD) stage G3b/A2, moderately decreased glomerular filtration rate (GFR) between 30-44 mL/min/1.73 square meter and albuminuria creatinine ratio between 30-299 mg/g (HCC) -Continue to avoid all NSAIDs - BMP8+EGFR - CBC with Differential/Platelet  6. Vitamin D deficiency -Continue calcium and vitamin D replacement because of fractures and osteo-porosis - CBC with Differential/Platelet - VITAMIN D 25  Hydroxy (Vit-D Deficiency, Fractures)  7. Gastroesophageal reflux disease, esophagitis presence not specified -Continue with reflux control - CBC with Differential/Platelet - Hepatic function panel  8. Metabolic syndrome -Continue with aggressive therapeutic lifestyle changes to maintain weight and lower BMI if possible - BMP8+EGFR - CBC with Differential/Platelet - Bayer DCA Hb A1c Waived  9. JAK2 V617F mutation -Follow-up with hematology as planned  10. Stage 3 chronic kidney disease ([place]) -Continue good blood pressure control and watch sodium intake  11. Essential thrombocytosis ([place]) -Follow-up with hematology  12. Benign hypertension with chronic kidney disease, stage III ([place]) -Continue current  treatment and avoid NSAIDs  Meds ordered this encounter  Medications  . ALPRAZolam (XANAX) 0.25 MG tablet    Sig: Take 1 tablet (0.25 mg total) by mouth daily as needed for anxiety.    Dispense:  30 tablet    Refill:  3   Patient Instructions                       Medicare Annual Wellness Visit  [facility] and the medical providers at [facility] strive to bring you the best medical care.  In doing so we not only want to address your current medical conditions and concerns but also to detect new conditions early and prevent illness, disease and health-related problems.    Medicare offers a yearly Wellness Visit which allows our clinical staff to assess your need for preventative services including immunizations, lifestyle education, counseling to decrease risk of preventable diseases and screening for fall risk and other medical concerns.    This visit is provided free of charge (no copay) for all Medicare recipients. The clinical pharmacists at [facility] have begun to conduct these Wellness Visits which will also include a thorough review of all your medications.    As you primary medical provider recommend that you make an appointment for your Annual Wellness Visit if you have not done so already this year.  You may set up this appointment before you leave today or you may call back ([phone]) and schedule an appointment.  Please make sure when you call that you mention that you are scheduling your Annual Wellness Visit with the clinical pharmacist so that the appointment may be made for the proper length of time.     Continue current medications. Continue good therapeutic lifestyle changes which include good diet and exercise. Fall precautions discussed with patient. If an FOBT was given today- please return it to our front desk. If you are over 87 years old - you may need Prevnar 25 or the adult Pneumonia vaccine.  **Flu shots are  available--- please call and schedule a FLU-CLINIC appointment**  After your visit with [place] today you will receive a survey in the mail or online from [employer] regarding your care with [place]. Please take a moment to fill this out. Your feedback is very important to [place] as you can help [place] better understand your patient needs as well as improve your experience and satisfaction. WE CARE ABOUT YOU!!!   If you have trouble getting an appointment with Dr. Lucia Gaskins or with a hematologist please call us back and we will do this for you Try to get out and get some walking and but always be careful not to put yourself at risk for falling Use Flonase 1 spray each nostril at bedtime to see if this helps your allergic rhinitis Follow-up with cardiology as planned  Don W.  Laurance Flatten MD

## 2018-03-14 NOTE — Patient Instructions (Addendum)
Medicare Annual Wellness Visit  [place] and the medical providers at [facility] strive to bring you the best medical care.  In doing so we not only want to address your current medical conditions and concerns but also to detect new conditions early and prevent illness, disease and health-related problems.    Medicare offers a yearly Wellness Visit which allows our clinical staff to assess your need for preventative services including immunizations, lifestyle education, counseling to decrease risk of preventable diseases and screening for fall risk and other medical concerns.    This visit is provided free of charge (no copay) for all Medicare recipients. The clinical pharmacists at [facility] have begun to conduct these Wellness Visits which will also include a thorough review of all your medications.    As you primary medical provider recommend that you make an appointment for your Annual Wellness Visit if you have not done so already this year.  You may set up this appointment before you leave today or you may call back ([phone]) and schedule an appointment.  Please make sure when you call that you mention that you are scheduling your Annual Wellness Visit with the clinical pharmacist so that the appointment may be made for the proper length of time.     Continue current medications. Continue good therapeutic lifestyle changes which include good diet and exercise. Fall precautions discussed with patient. If an FOBT was given today- please return it to our front desk. If you are over 37 years old - you may need Prevnar 69 or the adult Pneumonia vaccine.  **Flu shots are available--- please call and schedule a FLU-CLINIC appointment**  After your visit with [place] today you will receive a survey in the mail or online from [employer] regarding your care with [place]. Please take a moment to fill this out. Your feedback is very  important to [place] as you can help [place] better understand your patient needs as well as improve your experience and satisfaction. WE CARE ABOUT YOU!!!   If you have trouble getting an appointment with Dr. Lucia Gaskins or with a hematologist please call us back and we will do this for you Try to get out and get some walking and but always be careful not to put yourself at risk for falling Use Flonase 1 spray each nostril at bedtime to see if this helps your allergic rhinitis Follow-up with cardiology as planned

## 2018-03-15 LAB — LIPID PANEL
CHOL/HDL RATIO: 3.2 ratio (ref 0.0–4.4)
Cholesterol, Total: 177 mg/dL (ref 100–199)
HDL: 56 mg/dL (ref >39)
LDL CALC: 104 mg/dL — AB (ref 0–99)
TRIGLYCERIDES: 87 mg/dL (ref 0–149)
VLDL CHOLESTEROL CAL: 17 mg/dL (ref 5–40)

## 2018-03-15 LAB — HEPATIC FUNCTION PANEL
ALBUMIN: 4.4 g/dL (ref 3.5–4.7)
ALT: 10 IU/L (ref 0–32)
AST: 18 IU/L (ref 0–40)
Alkaline Phosphatase: 63 IU/L (ref 39–117)
Bilirubin Total: 0.3 mg/dL (ref 0.0–1.2)
Bilirubin, Direct: 0.11 mg/dL (ref 0.00–0.40)
Total Protein: 6.4 g/dL (ref 6.0–8.5)

## 2018-03-15 LAB — BMP8+EGFR
BUN/Creatinine Ratio: 21 (ref 12–28)
BUN: 22 mg/dL (ref 8–27)
CO2: 25 mmol/L (ref 20–29)
CREATININE: 1.07 mg/dL — AB (ref 0.57–1.00)
Calcium: 9.4 mg/dL (ref 8.7–10.3)
Chloride: 103 mmol/L (ref 96–106)
GFR calc Af Amer: 54 mL/min/{1.73_m2} — ABNORMAL LOW (ref >59)
GFR, EST NON AFRICAN AMERICAN: 47 mL/min/{1.73_m2} — AB (ref >59)
GLUCOSE: 75 mg/dL (ref 65–99)
Potassium: 5 mmol/L (ref 3.5–5.2)
Sodium: 138 mmol/L (ref 134–144)

## 2018-03-15 LAB — CBC WITH DIFFERENTIAL/PLATELET
Basophils Absolute: 0 10*3/uL (ref 0.0–0.2)
Basos: 0 %
EOS (ABSOLUTE): 0.1 10*3/uL (ref 0.0–0.4)
EOS: 1 %
HEMATOCRIT: 37.5 % (ref 34.0–46.6)
HEMOGLOBIN: 12.7 g/dL (ref 11.1–15.9)
IMMATURE GRANS (ABS): 0 10*3/uL (ref 0.0–0.1)
IMMATURE GRANULOCYTES: 0 %
Lymphocytes Absolute: 1.8 10*3/uL (ref 0.7–3.1)
Lymphs: 25 %
MCH: 30.8 pg (ref 26.6–33.0)
MCHC: 33.9 g/dL (ref 31.5–35.7)
MCV: 91 fL (ref 79–97)
MONOCYTES: 9 %
Monocytes Absolute: 0.7 10*3/uL (ref 0.1–0.9)
NEUTROS PCT: 65 %
Neutrophils Absolute: 4.6 10*3/uL (ref 1.4–7.0)
Platelets: 510 10*3/uL — ABNORMAL HIGH (ref 150–379)
RBC: 4.13 x10E6/uL (ref 3.77–5.28)
RDW: 15 % (ref 12.3–15.4)
WBC: 7.2 10*3/uL (ref 3.4–10.8)

## 2018-03-15 LAB — VITAMIN D 25 HYDROXY (VIT D DEFICIENCY, FRACTURES): Vit D, 25-Hydroxy: 48.1 ng/mL (ref 30.0–100.0)

## 2018-03-24 ENCOUNTER — Other Ambulatory Visit (HOSPITAL_COMMUNITY): Payer: Self-pay | Admitting: Adult Health

## 2018-03-24 DIAGNOSIS — D473 Essential (hemorrhagic) thrombocythemia: Secondary | ICD-10-CM

## 2018-04-12 ENCOUNTER — Other Ambulatory Visit: Payer: Self-pay

## 2018-04-12 ENCOUNTER — Inpatient Hospital Stay (HOSPITAL_COMMUNITY): Payer: Medicare Other

## 2018-04-12 ENCOUNTER — Encounter (HOSPITAL_COMMUNITY): Payer: Self-pay | Admitting: Hematology

## 2018-04-12 ENCOUNTER — Inpatient Hospital Stay (HOSPITAL_COMMUNITY): Payer: Medicare Other | Attending: Hematology | Admitting: Hematology

## 2018-04-12 VITALS — BP 147/63 | HR 73 | Temp 97.8°F | Resp 18 | Wt 166.5 lb

## 2018-04-12 DIAGNOSIS — H3530 Unspecified macular degeneration: Secondary | ICD-10-CM | POA: Diagnosis not present

## 2018-04-12 DIAGNOSIS — D473 Essential (hemorrhagic) thrombocythemia: Secondary | ICD-10-CM

## 2018-04-12 DIAGNOSIS — I480 Paroxysmal atrial fibrillation: Secondary | ICD-10-CM | POA: Diagnosis not present

## 2018-04-12 DIAGNOSIS — Z1589 Genetic susceptibility to other disease: Secondary | ICD-10-CM

## 2018-04-12 DIAGNOSIS — Z7901 Long term (current) use of anticoagulants: Secondary | ICD-10-CM | POA: Insufficient documentation

## 2018-04-12 DIAGNOSIS — Z79899 Other long term (current) drug therapy: Secondary | ICD-10-CM | POA: Diagnosis not present

## 2018-04-12 LAB — CBC WITH DIFFERENTIAL/PLATELET
BASOS PCT: 0 %
Basophils Absolute: 0 10*3/uL (ref 0.0–0.1)
EOS ABS: 0.1 10*3/uL (ref 0.0–0.7)
Eosinophils Relative: 1 %
HCT: 39.4 % (ref 36.0–46.0)
HEMOGLOBIN: 13.1 g/dL (ref 12.0–15.0)
Lymphocytes Relative: 26 %
Lymphs Abs: 1.8 10*3/uL (ref 0.7–4.0)
MCH: 30.7 pg (ref 26.0–34.0)
MCHC: 33.2 g/dL (ref 30.0–36.0)
MCV: 92.3 fL (ref 78.0–100.0)
MONOS PCT: 7 %
Monocytes Absolute: 0.5 10*3/uL (ref 0.1–1.0)
NEUTROS PCT: 66 %
Neutro Abs: 4.6 10*3/uL (ref 1.7–7.7)
Platelets: 429 10*3/uL — ABNORMAL HIGH (ref 150–400)
RBC: 4.27 MIL/uL (ref 3.87–5.11)
RDW: 13.7 % (ref 11.5–15.5)
WBC: 6.9 10*3/uL (ref 4.0–10.5)

## 2018-04-12 NOTE — Progress Notes (Signed)
HPI The patient presents for follow up of atrial fib.   She had been managed with Xarelto.  She did have some pulmonary HTN and TR on echo.  However, follow up echo in Feb of 2017demonstrated only mod TR and no significant pulmonary HTN.  Since I last saw her she had [facility] spotted fever and she is felt very fatigued since then.  She denies any chest pain, neck or arm discomfort.  Since Dr. Laurance Flatten increased her beta-blocker and hydrochlorothiazide she is actually had better breathing.  She is had no presyncope or syncope.  She is had no palpitations.   Interestingly she is in atrial flutter today.   Allergies  Allergen Reactions  . Codeine Nausea Only  . Latanoprost     Red and burning  . Tramadol Nausea Only    Current Outpatient Medications  Medication Sig Dispense Refill  . ALPRAZolam (XANAX) 0.25 MG tablet Take 1 tablet (0.25 mg total) by mouth daily as needed for anxiety. 30 tablet 3  . Cholecalciferol (VITAMIN D3) 2000 UNITS TABS Take by mouth daily.    Marland Kitchen estradiol (CLIMARA - DOSED IN MG/24 HR) 0.025 mg/24hr patch APPLY 1 PATCH ONCE A WEEK. REMOVE OLD PATCH EACH WEEK 12 patch 0  . hydrochlorothiazide (HYDRODIURIL) 25 MG tablet Take 1 tablet (25 mg total) by mouth daily. 90 tablet 1  . hydroxyurea (HYDREA) 500 MG capsule Take 1 capsule (500 mg total) by mouth daily. On Monday & Thursday only 30 capsule 1  . metoprolol succinate (TOPROL-XL) 25 MG 24 hr tablet Take 1 tablet (25 mg total) by mouth 2 (two) times daily. 180 tablet 1  . Multiple Vitamin (MULTIVITAMIN) tablet Take 1 tablet by mouth daily.      Marland Kitchen omeprazole (PRILOSEC) 20 MG capsule TAKE (1) CAPSULE DAILY 90 capsule 1  . Polyethyl Glycol-Propyl Glycol (SYSTANE OP) Apply 1-2 drops to eye 4 (four) times daily as needed (dry eyes).    . Rivaroxaban (XARELTO) 15 MG TABS tablet TAKE 1 TABLET (15 MG TOTAL) BY MOUTH DAILY. 90 tablet 0  . sertraline (ZOLOFT) 50 MG tablet 50 mg. Take one and 1/2 tablets by mouth daily    .  timolol (TIMOPTIC) 0.5 % ophthalmic solution Place 1 drop into both eyes daily.     . celecoxib (CELEBREX) 200 MG capsule Take 200 mg by mouth daily.     Marland Kitchen glucose blood (ONE TOUCH ULTRA TEST) test strip CHECK BLOOD SUGAR ONCE DAILY 100 each 2   No current facility-administered medications for this visit.     Past Medical History:  Diagnosis Date  . A-fib ([place])   . Cataract   . Closed left arm fracture 2016  . Colon polyps   . Diverticulosis   . Endometrial hyperplasia without atypia, simple   . Endometrial polyp   . Fatty liver   . GERD (gastroesophageal reflux disease)   . Hiatal hernia   . Hyperplastic colon polyp   . Hypertension   . Lipoma   . Obesity   . Optic neuropathy of both eyes 1989, 1997  . SOB (shortness of breath)   . Thrombocytosis ([place])     Past Surgical History:  Procedure Laterality Date  . breast biopsy    . CATARACT EXTRACTION    . CHOLECYSTECTOMY  03/2002  . DILATION AND CURETTAGE OF UTERUS     x 2  . EYE SURGERY    . HERNIA REPAIR    . KNEE SURGERY    .  optic neuritis    . umbilical hernia repair  09/2002    ROS:    As stated in the HPI and negative for all other systems.  PHYSICAL EXAM BP 130/74   Pulse 70   Ht 5\' 2"  (1.575 m)   Wt 166 lb (75.3 kg)   BMI 30.36 kg/m   GENERAL:  Well appearing NECK:  No jugular venous distention, waveform within normal limits, carotid upstroke brisk and symmetric, no bruits, no thyromegaly LUNGS:  Clear to auscultation bilaterally CHEST:  Unremarkable HEART:  PMI not displaced or sustained,S1 and S2 within normal limits, no S3, no clicks, no rubs, no murmurs ABD:  Flat, positive bowel sounds normal in frequency in pitch, no bruits, no rebound, no guarding, no midline pulsatile mass, no hepatomegaly, no splenomegaly EXT:  2 plus pulses throughout, no edema, no cyanosis no clubbing   GENERAL:  Well appearing and in no distress NECK:  No jugular venous distention, waveform within normal limits, carotid  upstroke brisk and symmetric, no bruits, no thyromegaly LUNGS:  Clear to auscultation bilaterally CHEST:  Unremarkable HEART:  PMI not displaced or sustained,S1 and S2 within normal limits, no S3, no S4, no clicks, no rubs, no murmurs ABD:  Flat, positive bowel sounds normal in frequency in pitch, no bruits, no rebound, no guarding, no midline pulsatile mass, no hepatomegaly, no splenomegaly EXT:  2 plus pulses throughout, no edema, no cyanosis no clubbing PSYCH:  Tearful  EKG: Atrial flutter with variable conduction, rate 70  left axis deviation, left anterior fascicular block, poor anterior R wave progression, no acute ST-T wave changes.  04/13/2018   ASSESSMENT AND PLAN  ATRIAL FLUTTER:  She has had no symptomatic recurrence of this.  [name] has a CHA2DS2 - VASc score of 5 with a risk of stroke of 6.7% .  She has been taking her anticoagulation.  It is possible that some of her fatigue might be related to the flutter I think is reasonable to try cardioversion provided this is a persistent rhythm.  I am going to apply a 48-hour Holter and if this demonstrates persistent flutter I will plan cardioversion.  HTN:  The blood pressure is at target and she will continue current meds.   TR:    No further testing is indicated.     This was moderate on echo in 2017 but she has no symptoms likely related to this.   DYSPNEA:   This is improved.  No change in therapy.   PRESYNCOPE:    She is not having this.  No change in therapy.

## 2018-04-12 NOTE — Progress Notes (Signed)
Patient Care Team: Chipper Herb, MD as PCP - General (Family Medicine) Minus Breeding, MD as Consulting Physician (Cardiology) Clent Jacks, MD as Consulting Physician (Ophthalmology) Whitney Muse, Kelby Fam, MD (Inactive) as Consulting Physician (Hematology and Oncology)  DIAGNOSIS:  Encounter Diagnoses  Name Primary?  . Essential thrombocytosis ([place]) Yes  . JAK2 V617F mutation      CHIEF COMPLIANT: Essential thrombocytosis; JAK2+ mutation   INTERVAL HISTORY: Kristy [name] is a 84 y.o. here for routine follow-up for essential thrombocytosis, JAK2+.   Here today with family.   Overall, she tells me she has been feeling very tired.  Reports having [facility] Spotted Fever last June; since that time,s he has been feeling tired.    Remains on Hydrea 500 mg on Mondays and Thursday.   Denies peripheral neuropathy or pruritis.  She is concerned about her vision changes; she sees ophthamologist in [place]; he has told her that she has early macular degeneration.  She wants to know if Hydrea is causing this.   Remains on Xarelto for A-fib.  She is wondering if it is safe to take turmeric supplement; Micromedex was reviewed and there are no documented drug-drug interactions between Xarelto and this dietary supplement.    She was encouraged to exercise to try to help with her fatigue.  She is continuing to grieve the loss of her husband, who passed away ~17 months ago.    Labs reviewed. Continue current dose and schedule of Hydrea.      REVIEW OF SYSTEMS:   Constitutional: Denies fevers, chills or abnormal weight loss.  Complains of fatigue. Eyes: Denies blurriness of vision Ears, nose, mouth, throat, and face: Denies mucositis or sore throat Respiratory: Denies cough, dyspnea or wheezes Cardiovascular: Denies palpitation, chest discomfort Gastrointestinal:  Denies nausea, heartburn or change in bowel habits Skin: Denies abnormal skin rashes Lymphatics: Denies new  lymphadenopathy or easy bruising Neurological:Denies numbness, tingling or new weaknesses Behavioral/Psych: Mood is stable, no new changes  Extremities: No lower extremity edema All other systems were reviewed with the patient and are negative.  I have reviewed the past medical history, past surgical history, social history and family history with the patient and they are unchanged from previous note.  ALLERGIES:  is allergic to codeine; latanoprost; and tramadol.  MEDICATIONS:  Current Outpatient Medications  Medication Sig Dispense Refill  . ALPRAZolam (XANAX) 0.25 MG tablet Take 1 tablet (0.25 mg total) by mouth daily as needed for anxiety. 30 tablet 3  . celecoxib (CELEBREX) 200 MG capsule     . Cholecalciferol (VITAMIN D3) 2000 UNITS TABS Take by mouth daily.    Marland Kitchen estradiol (CLIMARA - DOSED IN MG/24 HR) 0.025 mg/24hr patch APPLY 1 PATCH ONCE A WEEK. REMOVE OLD PATCH EACH WEEK 12 patch 0  . glucose blood (ONE TOUCH ULTRA TEST) test strip CHECK BLOOD SUGAR ONCE DAILY 100 each 2  . hydrochlorothiazide (HYDRODIURIL) 25 MG tablet Take 1 tablet (25 mg total) by mouth daily. 90 tablet 1  . hydroxyurea (HYDREA) 500 MG capsule Take 1 capsule (500 mg total) by mouth daily. On Monday & Thursday only 30 capsule 1  . metoprolol succinate (TOPROL-XL) 25 MG 24 hr tablet Take 1 tablet (25 mg total) by mouth 2 (two) times daily. 180 tablet 1  . Multiple Vitamin (MULTIVITAMIN) tablet Take 1 tablet by mouth daily.      Marland Kitchen omeprazole (PRILOSEC) 20 MG capsule TAKE (1) CAPSULE DAILY 90 capsule 1  . Polyethyl Glycol-Propyl Glycol (SYSTANE OP) Apply  1-2 drops to eye 4 (four) times daily as needed (dry eyes).    . Rivaroxaban (XARELTO) 15 MG TABS tablet TAKE 1 TABLET (15 MG TOTAL) BY MOUTH DAILY. 90 tablet 0  . sertraline (ZOLOFT) 50 MG tablet TAKE 1 & 1/2 TABLETS ONCE DAILY (Patient taking differently: Take 50 mg by mouth daily. ) 135 tablet 0  . timolol (TIMOPTIC) 0.5 % ophthalmic solution      No current  facility-administered medications for this visit.     PHYSICAL EXAMINATION: ECOG PERFORMANCE STATUS: 1 - Symptomatic but completely ambulatory  I have reviewed her vitals. GENERAL:alert, no distress and comfortable SKIN: skin color, texture, turgor are normal, no rashes or significant lesions    LABORATORY DATA:  I have reviewed the data as listed CMP Latest Ref Rng & Units 03/14/2018 12/29/2017 10/22/2017  Glucose 65 - 99 mg/dL 75 107(H) 112(H)  BUN 8 - 27 mg/dL 22 16 17   Creatinine 0.57 - 1.00 mg/dL 1.07(H) 1.02(H) 1.07(H)  Sodium 134 - 144 mmol/L 138 141 142  Potassium 3.5 - 5.2 mmol/L 5.0 4.2 4.6  Chloride 96 - 106 mmol/L 103 100 103  CO2 20 - 29 mmol/L 25 25 26   Calcium 8.7 - 10.3 mg/dL 9.4 9.7 9.5  Total Protein 6.0 - 8.5 g/dL 6.4 6.7 -  Total Bilirubin 0.0 - 1.2 mg/dL 0.3 0.4 -  Alkaline Phos 39 - 117 IU/L 63 66 -  AST 0 - 40 IU/L 18 22 -  ALT 0 - 32 IU/L 10 15 -   No results found for: [id number]   Lab Results  Component Value Date   WBC 6.9 04/12/2018   HGB 13.1 04/12/2018   HCT 39.4 04/12/2018   MCV 92.3 04/12/2018   PLT 429 (H) 04/12/2018   NEUTROABS 4.6 04/12/2018    ASSESSMENT & PLAN:  JAK2 V617F mutation 1.  Jak 2+ myeloproliferative disorder: - Hydroxyurea started in December 2016, currently taking 1 tablet on Monday and 1 tablet on Thursday. -Denies any side effects from it.  However complains of feeling tired since she had RMSF in June 2018.  She also lost her husband of 85 years about 17 months ago.  She is slightly depressed.  She denies any vasomotor symptoms.  Occasionally her toes are slightly red when she wakes up in the mornings.  They do not hurt or tingle.  Her platelet count today is 429.  We will continue the same dose of hydroxyurea Monday and Thursday.  She will be seen back in 4 months for follow-up.  2.  Paroxysmal atrial fibrillation: -She will continue Xarelto.    Orders Placed This Encounter  Procedures  . CBC with  Differential/Platelet    Standing Status:   Future    Standing Expiration Date:   04/13/2019  . Comprehensive metabolic panel    Standing Status:   Future    Standing Expiration Date:   04/13/2019    Order Specific Question:   Has the patient fasted?    Answer:   No   The patient has a good understanding of the overall plan. she agrees with it. she will call with any problems that may develop before the next visit here.   This note includes documentation from Mike Craze, NP, who was present during this patient's office visit and evaluation.  I have reviewed this note for its completeness and accuracy.  I have edited this note accordingly based on my findings and medical opinion.      Derek Jack,  MD 04/12/18

## 2018-04-12 NOTE — Assessment & Plan Note (Signed)
1.  Jak 2+ myeloproliferative disorder: - Hydroxyurea started in December 2016, currently taking 1 tablet on Monday and 1 tablet on Thursday. -Denies any side effects from it.  However complains of feeling tired since she had RMSF in June 2018.  She also lost her husband of 82 years about 17 months ago.  She is slightly depressed.  She denies any vasomotor symptoms.  Occasionally her toes are slightly red when she wakes up in the mornings.  They do not hurt or tingle.  Her platelet count today is 429.  We will continue the same dose of hydroxyurea Monday and Thursday.  She will be seen back in 4 months for follow-up.  2.  Paroxysmal atrial fibrillation: -She will continue Xarelto.

## 2018-04-13 ENCOUNTER — Ambulatory Visit: Payer: Medicare Other | Admitting: Cardiology

## 2018-04-13 ENCOUNTER — Encounter: Payer: Self-pay | Admitting: Cardiology

## 2018-04-13 VITALS — BP 130/74 | HR 70 | Ht 62.0 in | Wt 166.0 lb

## 2018-04-13 DIAGNOSIS — I4892 Unspecified atrial flutter: Secondary | ICD-10-CM | POA: Diagnosis not present

## 2018-04-13 DIAGNOSIS — I10 Essential (primary) hypertension: Secondary | ICD-10-CM

## 2018-04-13 NOTE — Patient Instructions (Addendum)
Medication Instructions:  The current medical regimen is effective;  continue present plan and medications.  Testing/Procedures: Your physician has recommended that you wear a holter monitor for 48 hours. Holter monitors are medical devices that record the heart's electrical activity. Doctors most often use these monitors to diagnose arrhythmias. Arrhythmias are problems with the speed or rhythm of the heartbeat. The monitor is a small, portable device. You can wear one while you do your normal daily activities. This is usually used to diagnose what is causing palpitations/syncope (passing out).  Follow-Up: Follow up will be determined based on the results of the holter monitor.  If you need a refill on your cardiac medications before your next appointment, please call your pharmacy.  Thank you for choosing [facility]!!

## 2018-04-14 DIAGNOSIS — I480 Paroxysmal atrial fibrillation: Secondary | ICD-10-CM | POA: Diagnosis not present

## 2018-04-20 ENCOUNTER — Telehealth: Payer: Self-pay | Admitting: Cardiology

## 2018-04-20 NOTE — Telephone Encounter (Signed)
Follow Up:    Pt wants to know if her monitor results are ready?

## 2018-04-21 ENCOUNTER — Encounter: Payer: Self-pay | Admitting: *Deleted

## 2018-04-21 NOTE — Telephone Encounter (Signed)
Spoke with patient who is aware of persistent At Fib and need for outpt cardioversion.  She is requesting to be scheduled for Thursday 04/28/18.  Was able to schedule her for that day at 9 am.  Pt is aware to arrive at the Main Entrance A at 7:30 am, NPO except medication with sips of water, make sure to not miss any Xarelto between now and then and to have someone with her to drive her home.  Pt states understanding.  Requested she call back with any further questions or concerns.

## 2018-04-21 NOTE — Telephone Encounter (Signed)
Notes recorded by Minus Breeding, MD on 04/20/2018 at 10:43 PM EDT Atrial fib with controlled ventricular rate. This is persistent. She should be set up for DCCV. Call Ms. Stansbery with the results and send results to Chipper Herb, MD

## 2018-04-22 ENCOUNTER — Encounter: Payer: Self-pay | Admitting: *Deleted

## 2018-04-27 ENCOUNTER — Telehealth: Payer: Self-pay | Admitting: Cardiology

## 2018-04-27 NOTE — Telephone Encounter (Signed)
Spoke with pt who states she is scheduled for a cardioversion tomorrow and wanted to make sure it wouldn't effect her hital hernia. Pt informed that MD is aware of all of her history but if its a concern to mention it to the anesthesiology. Pt verbalized understanding.

## 2018-04-27 NOTE — Telephone Encounter (Signed)
New Message:   Please call,have some questions about her procedure tomorrow.

## 2018-04-28 ENCOUNTER — Encounter (HOSPITAL_COMMUNITY): Payer: Self-pay | Admitting: Certified Registered"

## 2018-04-28 ENCOUNTER — Encounter (HOSPITAL_COMMUNITY)
Admission: RE | Disposition: A | Discharge status: Home / Self Care | Payer: Self-pay | Source: Ambulatory Visit | Attending: Cardiology

## 2018-04-28 ENCOUNTER — Ambulatory Visit (HOSPITAL_COMMUNITY)
Admission: RE | Admit: 2018-04-28 | Discharge: 2018-04-28 | Disposition: A | Discharge status: Home / Self Care | Payer: Medicare Other | Source: Ambulatory Visit | Attending: Cardiology | Admitting: Cardiology

## 2018-04-28 ENCOUNTER — Other Ambulatory Visit: Payer: Self-pay

## 2018-04-28 DIAGNOSIS — Z8619 Personal history of other infectious and parasitic diseases: Secondary | ICD-10-CM | POA: Diagnosis not present

## 2018-04-28 DIAGNOSIS — Z885 Allergy status to narcotic agent status: Secondary | ICD-10-CM | POA: Insufficient documentation

## 2018-04-28 DIAGNOSIS — I4892 Unspecified atrial flutter: Secondary | ICD-10-CM | POA: Diagnosis not present

## 2018-04-28 DIAGNOSIS — I4891 Unspecified atrial fibrillation: Secondary | ICD-10-CM | POA: Diagnosis not present

## 2018-04-28 DIAGNOSIS — Z888 Allergy status to other drugs, medicaments and biological substances status: Secondary | ICD-10-CM | POA: Diagnosis not present

## 2018-04-28 DIAGNOSIS — Z7901 Long term (current) use of anticoagulants: Secondary | ICD-10-CM | POA: Insufficient documentation

## 2018-04-28 DIAGNOSIS — I071 Rheumatic tricuspid insufficiency: Secondary | ICD-10-CM | POA: Insufficient documentation

## 2018-04-28 DIAGNOSIS — Z5309 Procedure and treatment not carried out because of other contraindication: Secondary | ICD-10-CM | POA: Insufficient documentation

## 2018-04-28 DIAGNOSIS — Z79899 Other long term (current) drug therapy: Secondary | ICD-10-CM | POA: Diagnosis not present

## 2018-04-28 DIAGNOSIS — K219 Gastro-esophageal reflux disease without esophagitis: Secondary | ICD-10-CM | POA: Insufficient documentation

## 2018-04-28 DIAGNOSIS — Z9049 Acquired absence of other specified parts of digestive tract: Secondary | ICD-10-CM | POA: Diagnosis not present

## 2018-04-28 DIAGNOSIS — Z86010 Personal history of colonic polyps: Secondary | ICD-10-CM | POA: Insufficient documentation

## 2018-04-28 DIAGNOSIS — Z9849 Cataract extraction status, unspecified eye: Secondary | ICD-10-CM | POA: Diagnosis not present

## 2018-04-28 SURGERY — CANCELLED PROCEDURE

## 2018-04-28 NOTE — Progress Notes (Signed)
Patient arrived to endoscopy for outpatient cardioversion, sinus rhythm on monitor, 12 lead EKG confirms same. Dr. Marlou Porch notified, verbal order to cancel cardioversion for self conversion. Patient expresses understanding and agrees. Discharged to home.

## 2018-05-03 ENCOUNTER — Telehealth: Payer: Self-pay | Admitting: Cardiology

## 2018-05-03 NOTE — Telephone Encounter (Signed)
New Message:    Pt said she went to the hospital for a Cardioversion on Thursday. She did not have to do it,heart was back in rhythm.She wants to know if there are any instructions she need to follow since she did not need the Cardioversion?

## 2018-05-03 NOTE — Telephone Encounter (Signed)
Kristy [name],  Please call her and see if she is feeling OK or better now that she is in NSR.  If so then I could see her in about 3 months.

## 2018-05-03 NOTE — Telephone Encounter (Signed)
Informed patient to continue taking current medications. patient aware will defer to  Dr Percival Spanish  When  follow up is needed.patien is aware both are ou to the office.  Forward to P. Avis Epley.  Patient does not have follow up scheduled

## 2018-05-04 NOTE — Telephone Encounter (Signed)
Spoke with patient who reports she is feeling well however unless her hr is above 100 bpm is doesn't know she is in At Fib.  Advised to call if she feels irregular otherwise she has been scheduled for 07/13/2018 at instructed by Dr Percival Spanish.  She was appreciative of the call.

## 2018-05-05 DIAGNOSIS — H47013 Ischemic optic neuropathy, bilateral: Secondary | ICD-10-CM | POA: Diagnosis not present

## 2018-05-05 DIAGNOSIS — H401233 Low-tension glaucoma, bilateral, severe stage: Secondary | ICD-10-CM | POA: Diagnosis not present

## 2018-05-05 DIAGNOSIS — Z961 Presence of intraocular lens: Secondary | ICD-10-CM | POA: Diagnosis not present

## 2018-05-05 DIAGNOSIS — H04123 Dry eye syndrome of bilateral lacrimal glands: Secondary | ICD-10-CM | POA: Diagnosis not present

## 2018-05-11 ENCOUNTER — Other Ambulatory Visit: Payer: Self-pay | Admitting: Family Medicine

## 2018-05-30 ENCOUNTER — Telehealth (HOSPITAL_COMMUNITY): Payer: Self-pay | Admitting: *Deleted

## 2018-06-02 ENCOUNTER — Other Ambulatory Visit (HOSPITAL_COMMUNITY): Payer: Self-pay | Admitting: *Deleted

## 2018-06-02 DIAGNOSIS — D473 Essential (hemorrhagic) thrombocythemia: Secondary | ICD-10-CM

## 2018-06-02 MED ORDER — HYDROXYUREA 500 MG PO CAPS: 500.0000 mg | ORAL_CAPSULE | Freq: Every day | ORAL | 1 refills | Status: DC

## 2018-06-16 ENCOUNTER — Other Ambulatory Visit: Payer: Self-pay | Admitting: Family Medicine

## 2018-07-04 ENCOUNTER — Other Ambulatory Visit: Payer: Self-pay | Admitting: Family Medicine

## 2018-07-06 ENCOUNTER — Ambulatory Visit (HOSPITAL_COMMUNITY): Payer: Medicare Other | Admitting: Hematology

## 2018-07-07 ENCOUNTER — Encounter: Payer: Self-pay | Admitting: Family Medicine

## 2018-07-07 ENCOUNTER — Ambulatory Visit (INDEPENDENT_AMBULATORY_CARE_PROVIDER_SITE_OTHER): Payer: Medicare Other | Admitting: Family Medicine

## 2018-07-07 ENCOUNTER — Other Ambulatory Visit: Payer: Self-pay | Admitting: Family Medicine

## 2018-07-07 VITALS — BP 136/71 | HR 60 | Temp 97.5°F | Ht 62.0 in | Wt 164.2 lb

## 2018-07-07 DIAGNOSIS — W57XXXA Bitten or stung by nonvenomous insect and other nonvenomous arthropods, initial encounter: Secondary | ICD-10-CM

## 2018-07-07 DIAGNOSIS — S30861A Insect bite (nonvenomous) of abdominal wall, initial encounter: Secondary | ICD-10-CM | POA: Diagnosis not present

## 2018-07-07 MED ORDER — HYDROXYZINE HCL 10 MG PO TABS: 10.0000 mg | ORAL_TABLET | Freq: Three times a day (TID) | ORAL | 0 refills | Status: DC | PRN

## 2018-07-07 NOTE — Progress Notes (Signed)
BP 136/71   Pulse 60   Temp (!) 97.5 F (36.4 C) (Oral)   Ht 5\' 2"  (1.575 m)   Wt 164 lb 3.2 oz (74.5 kg)   BMI 30.03 kg/m    Subjective:    Patient ID: Kristy [name], female    DOB: 09-Sep-1928, 84 y.o.   MRN: [id number]  HPI: ANBER MCKIVER is a 84 y.o. female presenting on 07/07/2018 for bug bites (all over- itching. x 1 day)   HPI Bug bites and itching Patient has bug bites around her waistline and abdomen and upper legs she is noticed over the past day or 2 and she has a lot of pruritus associated with them.  She was concerned about possibly where they came from.  She does have a pet cat but does not think the cat has fleas but has not had the cat treated recently with the preventative.  She denies any fevers or chills or drainage or redness or warmth.  Relevant past medical, surgical, family and social history reviewed and updated as indicated. Interim medical history since our last visit reviewed. Allergies and medications reviewed and updated.  Review of Systems  Constitutional: Negative for chills and fever.  Eyes: Negative for visual disturbance.  Respiratory: Negative for chest tightness and shortness of breath.   Cardiovascular: Negative for chest pain and leg swelling.  Musculoskeletal: Negative for back pain and gait problem.  Skin: Positive for rash.  Neurological: Negative for light-headedness and headaches.  Psychiatric/Behavioral: Negative for agitation and behavioral problems.  All other systems reviewed and are negative.   Per HPI unless specifically indicated above   Allergies as of 07/07/2018      Reactions   Codeine Nausea Only   Latanoprost Other (See Comments)   Red and burning   Tramadol Nausea Only      Medication List        Accurate as of 07/07/18 11:36 AM. Always use your most recent med list.          ALPRAZolam 0.25 MG tablet Commonly known as:  XANAX Take 1 tablet (0.25 mg total) by mouth daily as needed for anxiety.   estradiol  0.025 mg/24hr patch Commonly known as:  CLIMARA - Dosed in mg/24 hr APPLY 1 PATCH TOPICALLY TO  SKIN WEEKLY (REMOVE OLD  PATCH EACH WEEK)   EYE VITAMINS PO Take 1 tablet by mouth daily.   glucose blood test strip Commonly known as:  ONE TOUCH ULTRA TEST CHECK BLOOD SUGAR ONCE DAILY   hydrochlorothiazide 25 MG tablet Commonly known as:  HYDRODIURIL Take 1 tablet (25 mg total) by mouth daily.   hydroxyurea 500 MG capsule Commonly known as:  HYDREA Take 1 capsule (500 mg total) by mouth daily. On Monday & Thursday only   hydrOXYzine 10 MG tablet Commonly known as:  ATARAX/VISTARIL Take 1 tablet (10 mg total) by mouth 3 (three) times daily as needed.   metoprolol succinate 25 MG 24 hr tablet Commonly known as:  TOPROL-XL Take 1 tablet (25 mg total) by mouth 2 (two) times daily.   omeprazole 20 MG capsule Commonly known as:  PRILOSEC TAKE (1) CAPSULE DAILY   sertraline 50 MG tablet Commonly known as:  ZOLOFT TAKE 1 AND 1/2 TABLETS BY  MOUTH ONCE DAILY   SYSTANE OP Apply 1-2 drops to eye 4 (four) times daily as needed (dry eyes).   timolol 0.5 % ophthalmic solution Commonly known as:  TIMOPTIC Place 1 drop into both eyes daily.  Vitamin D3 2000 units Tabs Take 2,000 Units by mouth daily.   XARELTO 15 MG Tabs tablet Generic drug:  Rivaroxaban TAKE 1 TABLET BY MOUTH  DAILY          Objective:    BP 136/71   Pulse 60   Temp (!) 97.5 F (36.4 C) (Oral)   Ht 5\' 2"  (1.575 m)   Wt 164 lb 3.2 oz (74.5 kg)   BMI 30.03 kg/m   Wt Readings from Last 3 Encounters:  07/07/18 164 lb 3.2 oz (74.5 kg)  04/13/18 166 lb (75.3 kg)  04/12/18 166 lb 8 oz (75.5 kg)    Physical Exam  Constitutional: She is oriented to person, place, and time. She appears well-developed and well-nourished. No distress.  Eyes: Conjunctivae are normal.  Cardiovascular: Normal rate, regular rhythm, normal heart sounds and intact distal pulses.  No murmur heard. Pulmonary/Chest: Effort normal  and breath sounds normal. No respiratory distress. She has no wheezes.  Musculoskeletal: Normal range of motion. She exhibits no edema.  Neurological: She is alert and oriented to person, place, and time. Coordination normal.  Skin: Skin is warm and dry. Rash noted. Rash is papular (Small scattered papules lower upper abdomen and upper thighs, consistent with arthropod bites, no signs of infection). She is not diaphoretic.  Psychiatric: She has a normal mood and affect. Her behavior is normal.  Nursing note and vitals reviewed.       Assessment & Plan:   Problem List Items Addressed This Visit    None    Visit Diagnoses    Arthropod bite, initial encounter    -  Primary   Mostly centralized around her waistline and has a few on her upper legs, patient has a cat   Relevant Medications   hydrOXYzine (ATARAX/VISTARIL) 10 MG tablet       Follow up plan: Return if symptoms worsen or fail to improve.  Counseling provided for all of the vaccine components No orders of the defined types were placed in this encounter.   Caryl Pina, MD [facility] 07/07/2018, 11:36 AM

## 2018-07-13 ENCOUNTER — Ambulatory Visit: Payer: Medicare Other | Admitting: Cardiology

## 2018-07-14 NOTE — Progress Notes (Signed)
HPI The patient presents for follow up of atrial fib.   She had been managed with Xarelto.  She did have some pulmonary HTN and TR on echo.  However, follow up echo in Feb of 2017demonstrated only mod TR and no significant pulmonary HTN.  At the last visit she was in atrial flutter and cardioversion was planned but she went back into NSR on her own.    The patient denies any new symptoms such as chest discomfort, neck or arm discomfort. There has been no new shortness of breath, PND or orthopnea. There have been no reported palpitations, presyncope or syncope.  She is doing OK.  She misses her husband.   She goes to church.  She visits friends and goes out to eat. .   Allergies  Allergen Reactions  . Codeine Nausea Only  . Latanoprost Other (See Comments)    Red and burning  . Tramadol Nausea Only    Current Outpatient Medications  Medication Sig Dispense Refill  . ALPRAZolam (XANAX) 0.25 MG tablet Take 1 tablet (0.25 mg total) by mouth daily as needed for anxiety. (Patient taking differently: Take 0.125 mg by mouth daily as needed for anxiety. ) 30 tablet 3  . Cholecalciferol (VITAMIN D3) 2000 UNITS TABS Take 2,000 Units by mouth daily.     Marland Kitchen estradiol (CLIMARA - DOSED IN MG/24 HR) 0.025 mg/24hr patch APPLY 1 PATCH TOPICALLY TO  SKIN WEEKLY (REMOVE OLD  PATCH EACH WEEK) 12 patch 0  . hydrochlorothiazide (HYDRODIURIL) 25 MG tablet TAKE 1 TABLET BY MOUTH  DAILY 90 tablet 0  . hydroxyurea (HYDREA) 500 MG capsule Take 1 capsule (500 mg total) by mouth daily. On Monday & Thursday only 30 capsule 1  . metoprolol succinate (TOPROL-XL) 25 MG 24 hr tablet TAKE 1 TABLET BY MOUTH TWO  TIMES DAILY 180 tablet 0  . Multiple Vitamins-Minerals (EYE VITAMINS PO) Take 1 tablet by mouth daily.    Marland Kitchen omeprazole (PRILOSEC) 20 MG capsule TAKE (1) CAPSULE DAILY (Patient taking differently: Take 20 mg by mouth daily. ) 90 capsule 1  . Polyethyl Glycol-Propyl Glycol (SYSTANE OP) Apply 1-2 drops to eye 4  (four) times daily as needed (dry eyes).    . sertraline (ZOLOFT) 50 MG tablet TAKE 1 AND 1/2 TABLETS BY  MOUTH ONCE DAILY 135 tablet 0  . timolol (TIMOPTIC) 0.5 % ophthalmic solution Place 1 drop into both eyes daily.     Alveda Reasons 15 MG TABS tablet TAKE 1 TABLET BY MOUTH  DAILY 90 tablet 0  . glucose blood (ONE TOUCH ULTRA TEST) test strip CHECK BLOOD SUGAR ONCE DAILY 100 each 2   No current facility-administered medications for this visit.     Past Medical History:  Diagnosis Date  . A-fib ([place])   . Cataract   . Closed left arm fracture 2016  . Colon polyps   . Diverticulosis   . Endometrial hyperplasia without atypia, simple   . Endometrial polyp   . Fatty liver   . GERD (gastroesophageal reflux disease)   . Hiatal hernia   . Hyperplastic colon polyp   . Hypertension   . Lipoma   . Obesity   . Optic neuropathy of both eyes 1989, 1997  . SOB (shortness of breath)   . Thrombocytosis ([place])     Past Surgical History:  Procedure Laterality Date  . breast biopsy    . CATARACT EXTRACTION    . CHOLECYSTECTOMY  03/2002  . DILATION AND CURETTAGE  OF UTERUS     x 2  . EYE SURGERY    . HERNIA REPAIR    . KNEE SURGERY    . optic neuritis    . umbilical hernia repair  09/2002    ROS:  As stated in the HPI and negative for all other systems.  PHYSICAL EXAM BP 120/62   Pulse (!) 48   Ht 5\' 2"  (1.575 m)   Wt 165 lb (74.8 kg)   BMI 30.18 kg/m   GENERAL:  Well appearing NECK:  No jugular venous distention, waveform within normal limits, carotid upstroke brisk and symmetric, no bruits, no thyromegaly LUNGS:  Clear to auscultation bilaterally CHEST:  Unremarkable HEART:  PMI not displaced or sustained,S1 and S2 within normal limits, no S3, no S4, no clicks, no rubs, no murmurs ABD:  Flat, positive bowel sounds normal in frequency in pitch, no bruits, no rebound, no guarding, no midline pulsatile mass, no hepatomegaly, no splenomegaly EXT:  2 plus pulses throughout, no edema,  no cyanosis no clubbing   EKG:  SB with sinus arrhythmia, rate 48  left axis deviation, left anterior fascicular block, poor anterior R wave progression, no acute ST-T wave changes.  07/15/2018   ASSESSMENT AND PLAN  ATRIAL FLUTTER:   Ms. TAKIRAH BINFORD has a CHA2DS2 - VASc score of 5 with a risk of stroke of 6.7% .  She has had no symptomatic recurrence.  No change in therapy  .  Her CG GFR calculates to 44 so she is on the correct dose.   HTN:  The blood pressure is at target. No change in medications is indicated. We will continue with therapeutic lifestyle changes (TLC).  TR:    No further testing is indicated.   DYSPNEA:     Her breathing is OK.  No change in therapy.  No further testing.  PRESYNCOPE:   She has had no syncope.  No further symptoms.  No change in therapy.

## 2018-07-15 ENCOUNTER — Ambulatory Visit: Payer: Medicare Other | Admitting: Cardiology

## 2018-07-15 ENCOUNTER — Encounter: Payer: Self-pay | Admitting: Cardiology

## 2018-07-15 VITALS — BP 120/62 | HR 48 | Ht 62.0 in | Wt 165.0 lb

## 2018-07-15 DIAGNOSIS — I10 Essential (primary) hypertension: Secondary | ICD-10-CM

## 2018-07-15 DIAGNOSIS — I480 Paroxysmal atrial fibrillation: Secondary | ICD-10-CM

## 2018-07-15 DIAGNOSIS — I361 Nonrheumatic tricuspid (valve) insufficiency: Secondary | ICD-10-CM | POA: Diagnosis not present

## 2018-07-15 NOTE — Patient Instructions (Signed)
Medication Instructions:  The current medical regimen is effective;  continue present plan and medications.  Follow-Up: Follow up in 6 months with Dr. Hochrein.  You will receive a letter in the mail 2 months before you are due.  Please call us when you receive this letter to schedule your follow up appointment.  If you need a refill on your cardiac medications before your next appointment, please call your pharmacy.  Thank you for choosing [facility]!!       

## 2018-08-09 ENCOUNTER — Other Ambulatory Visit (HOSPITAL_COMMUNITY): Payer: Medicare Other

## 2018-08-16 ENCOUNTER — Encounter (HOSPITAL_COMMUNITY): Payer: Self-pay | Admitting: Internal Medicine

## 2018-08-16 ENCOUNTER — Inpatient Hospital Stay (HOSPITAL_COMMUNITY): Payer: Medicare Other

## 2018-08-16 ENCOUNTER — Inpatient Hospital Stay (HOSPITAL_COMMUNITY): Payer: Medicare Other | Attending: Internal Medicine | Admitting: Internal Medicine

## 2018-08-16 ENCOUNTER — Other Ambulatory Visit: Payer: Self-pay

## 2018-08-16 DIAGNOSIS — L989 Disorder of the skin and subcutaneous tissue, unspecified: Secondary | ICD-10-CM | POA: Insufficient documentation

## 2018-08-16 DIAGNOSIS — R443 Hallucinations, unspecified: Secondary | ICD-10-CM | POA: Insufficient documentation

## 2018-08-16 DIAGNOSIS — Z79899 Other long term (current) drug therapy: Secondary | ICD-10-CM | POA: Diagnosis not present

## 2018-08-16 DIAGNOSIS — D473 Essential (hemorrhagic) thrombocythemia: Secondary | ICD-10-CM

## 2018-08-16 DIAGNOSIS — Z7901 Long term (current) use of anticoagulants: Secondary | ICD-10-CM | POA: Insufficient documentation

## 2018-08-16 DIAGNOSIS — C946 Myelodysplastic disease, not classified: Secondary | ICD-10-CM | POA: Insufficient documentation

## 2018-08-16 DIAGNOSIS — I4891 Unspecified atrial fibrillation: Secondary | ICD-10-CM

## 2018-08-16 DIAGNOSIS — F4321 Adjustment disorder with depressed mood: Secondary | ICD-10-CM | POA: Diagnosis not present

## 2018-08-16 DIAGNOSIS — Z1589 Genetic susceptibility to other disease: Secondary | ICD-10-CM

## 2018-08-16 LAB — COMPREHENSIVE METABOLIC PANEL
ALT: 16 U/L (ref 0–44)
AST: 25 U/L (ref 15–41)
Albumin: 4.1 g/dL (ref 3.5–5.0)
Alkaline Phosphatase: 61 U/L (ref 38–126)
Anion gap: 7 (ref 5–15)
BUN: 14 mg/dL (ref 8–23)
CHLORIDE: 108 mmol/L (ref 98–111)
CO2: 26 mmol/L (ref 22–32)
CREATININE: 1.07 mg/dL — AB (ref 0.44–1.00)
Calcium: 8.9 mg/dL (ref 8.9–10.3)
GFR, EST AFRICAN AMERICAN: 53 mL/min — AB (ref >60)
GFR, EST NON AFRICAN AMERICAN: 45 mL/min — AB (ref >60)
Glucose, Bld: 140 mg/dL — ABNORMAL HIGH (ref 70–99)
POTASSIUM: 3.6 mmol/L (ref 3.5–5.1)
Sodium: 141 mmol/L (ref 135–145)
TOTAL PROTEIN: 6.5 g/dL (ref 6.5–8.1)
Total Bilirubin: 0.5 mg/dL (ref 0.3–1.2)

## 2018-08-16 LAB — CBC WITH DIFFERENTIAL/PLATELET
Basophils Absolute: 0 10*3/uL (ref 0.0–0.1)
Basophils Relative: 0 %
EOS PCT: 2 %
Eosinophils Absolute: 0.1 10*3/uL (ref 0.0–0.7)
HCT: 41 % (ref 36.0–46.0)
Hemoglobin: 12.4 g/dL (ref 12.0–15.0)
LYMPHS ABS: 1.6 10*3/uL (ref 0.7–4.0)
LYMPHS PCT: 24 %
MCH: 30.6 pg (ref 26.0–34.0)
MCHC: 30.2 g/dL (ref 30.0–36.0)
MCV: 101.2 fL — AB (ref 78.0–100.0)
Monocytes Absolute: 0.6 10*3/uL (ref 0.1–1.0)
Monocytes Relative: 9 %
Neutro Abs: 4.5 10*3/uL (ref 1.7–7.7)
Neutrophils Relative %: 65 %
PLATELETS: 399 10*3/uL (ref 150–400)
RBC: 4.05 MIL/uL (ref 3.87–5.11)
RDW: 14.7 % (ref 11.5–15.5)
WBC: 6.8 10*3/uL (ref 4.0–10.5)

## 2018-08-16 MED ORDER — HYDROXYUREA 500 MG PO CAPS: 500.0000 mg | ORAL_CAPSULE | Freq: Every day | ORAL | 4 refills | Status: DC

## 2018-08-16 NOTE — Progress Notes (Signed)
Diagnosis Essential thrombocytosis ([place]) - Plan: hydroxyurea (HYDREA) 500 MG capsule  Staging Cancer Staging No matching staging information was found for the patient.  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  Jak 2+ myeloproliferative disorder: - Hydroxyurea started in December 2016, currently taking 1 tablet on Monday and 1 tablet on Thursday.  Tolerating therapy.    Labs done 08/16/2018 reviewed and showed WBC 6.8, HB 12.4, plts 399,000.  Chemistries WNL with K+ 3.6, Cr 1 and normal LFTs.    She will continue Hydrea at present dose.  Pt will RTC in 12/2018 for follow-up and repeat labs.  RX for Hydrea sent to pharmacy.    2.  Hallucinations.  I have discussed with her to speak to PCP regarding this.  She reports this occurred after she lost her husband 17 months ago.    3.  Atrial Fibrillation.  Pt on Xarelto.  Follow-up with Cardiology or PCP as directed.    4.  Skin lesions.  Age related changes.  No petechia noted.    5.  Loss of husband.  Sympathy extended.  May need grief counseling.    30 minutes spent with more than 50% spent in counseling and coordination of care.    Current Status:  Pt seen today for follow-up to go over labs.  Pt reports occasional hallucinations since losing husband.    Problem List Patient Active Problem List   Diagnosis Date Noted  . Non-rheumatic tricuspid valve insufficiency [I36.1] 07/15/2018  . Tick bite [id number] 05/10/2017  . Thoracic aortic atherosclerosis ([place]) [I70.0] 11/06/2016  . Metabolic syndrome [O27.03] 07/07/2016  . JAK2 V617F mutation [Z15.89] 01/24/2016  . Essential thrombocytosis ([place]) [D47.3] 01/24/2016  . PAF (paroxysmal atrial fibrillation) ([place]) [I48.0] 12/18/2013  . Cervical spondylosis without myelopathy [M47.812] 04/27/2013  . ABDOMINAL PAIN, UNSPECIFIED SITE [R10.9] 02/02/2011  . Palpitations [R00.2] 02/26/2010  . COLONIC POLYPS, HYPERPLASTIC [D12.6] 12/07/2007  . LIPOMA [D17.9] 12/07/2007  . OBESITY [E66.9]  12/07/2007  . Essential hypertension [I10] 12/07/2007  . GERD [K21.9] 12/07/2007  . HIATAL HERNIA [K44.9] 12/07/2007  . DIVERTICULOSIS, COLON [K57.30] 12/07/2007  . FATTY LIVER DISEASE [K76.89] 12/07/2007  . ENDOMETRIAL POLYP [N84.0] 12/07/2007  . COMPLEX ENDOMETRIAL HYPERPLASIA WITHOUT ATYPIA [N85.01] 12/07/2007  . SOB [R06.02] 12/07/2007    Past Medical History Past Medical History:  Diagnosis Date  . A-fib ([place])   . Cataract   . Closed left arm fracture 2016  . Colon polyps   . Diverticulosis   . Endometrial hyperplasia without atypia, simple   . Endometrial polyp   . Fatty liver   . GERD (gastroesophageal reflux disease)   . Hiatal hernia   . Hyperplastic colon polyp   . Hypertension   . Lipoma   . Obesity   . Optic neuropathy of both eyes 1989, 1997  . SOB (shortness of breath)   . Thrombocytosis ([place])     Past Surgical History Past Surgical History:  Procedure Laterality Date  . breast biopsy    . CATARACT EXTRACTION    . CHOLECYSTECTOMY  03/2002  . DILATION AND CURETTAGE OF UTERUS     x 2  . EYE SURGERY    . HERNIA REPAIR    . KNEE SURGERY    . optic neuritis    . umbilical hernia repair  09/2002    Family History Family History  Problem Relation Age of Onset  . Diabetes Mother   . Diabetes Sister   . Stroke Father   . Diabetes Brother   .  Heart disease Brother   . Cancer Sister        lung  . Cancer Sister        vulva  . Cancer Sister   . Dementia Sister   . Osteoporosis Sister   . Diabetes Sister   . Osteoporosis Sister   . Diabetes Sister   . Cancer Brother        bladder  . Colon cancer Neg Hx      Social History  reports that she quit smoking about 35 years ago. She has never used smokeless tobacco. She reports that she does not drink alcohol or use drugs.  Medications  Current Outpatient Medications:  .  ALPRAZolam (XANAX) 0.25 MG tablet, Take 1 tablet (0.25 mg total) by mouth daily as needed for anxiety. (Patient taking  differently: Take 0.125 mg by mouth daily as needed for anxiety. ), Disp: 30 tablet, Rfl: 3 .  Cholecalciferol (VITAMIN D3) 2000 UNITS TABS, Take 2,000 Units by mouth daily. , Disp: , Rfl:  .  estradiol (CLIMARA - DOSED IN MG/24 HR) 0.025 mg/24hr patch, APPLY 1 PATCH TOPICALLY TO  SKIN WEEKLY (REMOVE OLD  PATCH EACH WEEK), Disp: 12 patch, Rfl: 0 .  glucose blood (ONE TOUCH ULTRA TEST) test strip, CHECK BLOOD SUGAR ONCE DAILY, Disp: 100 each, Rfl: 2 .  hydrochlorothiazide (HYDRODIURIL) 25 MG tablet, TAKE 1 TABLET BY MOUTH  DAILY, Disp: 90 tablet, Rfl: 0 .  hydroxyurea (HYDREA) 500 MG capsule, Take 1 capsule (500 mg total) by mouth daily. On Monday & Thursday only, Disp: 30 capsule, Rfl: 4 .  metoprolol succinate (TOPROL-XL) 25 MG 24 hr tablet, TAKE 1 TABLET BY MOUTH TWO  TIMES DAILY, Disp: 180 tablet, Rfl: 0 .  Multiple Vitamins-Minerals (EYE VITAMINS PO), Take 1 tablet by mouth daily., Disp: , Rfl:  .  omeprazole (PRILOSEC) 20 MG capsule, TAKE (1) CAPSULE DAILY (Patient taking differently: Take 20 mg by mouth daily. ), Disp: 90 capsule, Rfl: 1 .  Polyethyl Glycol-Propyl Glycol (SYSTANE OP), Apply 1-2 drops to eye 4 (four) times daily as needed (dry eyes)., Disp: , Rfl:  .  sertraline (ZOLOFT) 50 MG tablet, TAKE 1 AND 1/2 TABLETS BY  MOUTH ONCE DAILY, Disp: 135 tablet, Rfl: 0 .  timolol (TIMOPTIC) 0.5 % ophthalmic solution, Place 1 drop into both eyes daily. , Disp: , Rfl:  .  XARELTO 15 MG TABS tablet, TAKE 1 TABLET BY MOUTH  DAILY, Disp: 90 tablet, Rfl: 0  Allergies Codeine; Latanoprost; and Tramadol  Review of Systems Review of Systems - Oncology ROS negative other than hallucinations and skin lesions   Physical Exam  Vitals Wt Readings from Last 3 Encounters:  08/16/18 170 lb 8 oz (77.3 kg)  07/15/18 165 lb (74.8 kg)  07/07/18 164 lb 3.2 oz (74.5 kg)   Temp Readings from Last 3 Encounters:  08/16/18 97.9 F (36.6 C) (Oral)  07/07/18 (!) 97.5 F (36.4 C) (Oral)  04/12/18 97.8 F  (36.6 C) (Oral)   BP Readings from Last 3 Encounters:  08/16/18 (!) 145/75  07/15/18 120/62  07/07/18 136/71   Pulse Readings from Last 3 Encounters:  08/16/18 65  07/15/18 (!) 48  07/07/18 60   Constitutional: Well-developed, well-nourished, and in no distress.   HENT: Head: Normocephalic and atraumatic.  Mouth/Throat: No oropharyngeal exudate. Mucosa moist. Eyes: Pupils are equal, round, and reactive to light. Conjunctivae are normal. No scleral icterus.  Neck: Normal range of motion. Neck supple. No JVD present.  Cardiovascular: Normal  rate, regular rhythm and normal heart sounds.  Exam reveals no gallop and no friction rub.   No murmur heard. Pulmonary/Chest: Effort normal and breath sounds normal. No respiratory distress. No wheezes.No rales.  Abdominal: Soft. Bowel sounds are normal. No distension. There is no tenderness. There is no guarding.  Musculoskeletal: No edema or tenderness.  Lymphadenopathy: No cervical, axillary or supraclavicular adenopathy.  Neurological: Alert and oriented to person, place, and time. No cranial nerve deficit.  Skin: Skin is warm and dry. No rash noted. No erythema. No pallor. Age related changes, no petechia.  Psychiatric: Affect and judgment normal.   Labs Appointment on 08/16/2018  Component Date Value Ref Range Status  . WBC 08/16/2018 6.8  4.0 - 10.5 K/uL Final  . RBC 08/16/2018 4.05  3.87 - 5.11 MIL/uL Final  . Hemoglobin 08/16/2018 12.4  12.0 - 15.0 g/dL Final  . HCT 08/16/2018 41.0  36.0 - 46.0 % Final  . MCV 08/16/2018 101.2* 78.0 - 100.0 fL Final  . MCH 08/16/2018 30.6  26.0 - 34.0 pg Final  . MCHC 08/16/2018 30.2  30.0 - 36.0 g/dL Final  . RDW 08/16/2018 14.7  11.5 - 15.5 % Final  . Platelets 08/16/2018 399  150 - 400 K/uL Final  . Neutrophils Relative % 08/16/2018 65  % Final  . Neutro Abs 08/16/2018 4.5  1.7 - 7.7 K/uL Final  . Lymphocytes Relative 08/16/2018 24  % Final  . Lymphs Abs 08/16/2018 1.6  0.7 - 4.0 K/uL Final  .  Monocytes Relative 08/16/2018 9  % Final  . Monocytes Absolute 08/16/2018 0.6  0.1 - 1.0 K/uL Final  . Eosinophils Relative 08/16/2018 2  % Final  . Eosinophils Absolute 08/16/2018 0.1  0.0 - 0.7 K/uL Final  . Basophils Relative 08/16/2018 0  % Final  . Basophils Absolute 08/16/2018 0.0  0.0 - 0.1 K/uL Final   Performed at [facility], [address], [place], [place] [address]  . Sodium 08/16/2018 141  135 - 145 mmol/L Final  . Potassium 08/16/2018 3.6  3.5 - 5.1 mmol/L Final  . Chloride 08/16/2018 108  98 - 111 mmol/L Final  . CO2 08/16/2018 26  22 - 32 mmol/L Final  . Glucose, Bld 08/16/2018 140* 70 - 99 mg/dL Final  . BUN 08/16/2018 14  8 - 23 mg/dL Final  . Creatinine, Ser 08/16/2018 1.07* 0.44 - 1.00 mg/dL Final  . Calcium 08/16/2018 8.9  8.9 - 10.3 mg/dL Final  . Total Protein 08/16/2018 6.5  6.5 - 8.1 g/dL Final  . Albumin 08/16/2018 4.1  3.5 - 5.0 g/dL Final  . AST 08/16/2018 25  15 - 41 U/L Final  . ALT 08/16/2018 16  0 - 44 U/L Final  . Alkaline Phosphatase 08/16/2018 61  38 - 126 U/L Final  . Total Bilirubin 08/16/2018 0.5  0.3 - 1.2 mg/dL Final  . GFR calc non Af Amer 08/16/2018 45* >60 mL/min Final  . GFR calc Af Amer 08/16/2018 53* >60 mL/min Final   Comment: (NOTE) The eGFR has been calculated using the CKD EPI equation. This calculation has not been validated in all clinical situations. eGFR's persistently <60 mL/min signify possible Chronic Kidney Disease.   Georgiann Hahn gap 08/16/2018 7  5 - 15 Final   Performed at [facility], [address] [place], [place] [address]     Pathology No orders of the defined types were placed in this encounter.      Zoila Shutter MD

## 2018-08-25 ENCOUNTER — Other Ambulatory Visit: Payer: Self-pay | Admitting: Family Medicine

## 2018-09-26 ENCOUNTER — Encounter: Payer: Self-pay | Admitting: Family Medicine

## 2018-09-26 ENCOUNTER — Ambulatory Visit (INDEPENDENT_AMBULATORY_CARE_PROVIDER_SITE_OTHER): Payer: Medicare Other | Admitting: Family Medicine

## 2018-09-26 VITALS — BP 138/69 | HR 56 | Temp 96.8°F | Ht 62.0 in | Wt 167.0 lb

## 2018-09-26 DIAGNOSIS — R0602 Shortness of breath: Secondary | ICD-10-CM

## 2018-09-26 DIAGNOSIS — N183 Chronic kidney disease, stage 3 unspecified: Secondary | ICD-10-CM

## 2018-09-26 DIAGNOSIS — I480 Paroxysmal atrial fibrillation: Secondary | ICD-10-CM | POA: Diagnosis not present

## 2018-09-26 DIAGNOSIS — D473 Essential (hemorrhagic) thrombocythemia: Secondary | ICD-10-CM

## 2018-09-26 DIAGNOSIS — Z23 Encounter for immunization: Secondary | ICD-10-CM

## 2018-09-26 DIAGNOSIS — E559 Vitamin D deficiency, unspecified: Secondary | ICD-10-CM

## 2018-09-26 DIAGNOSIS — E8881 Metabolic syndrome: Secondary | ICD-10-CM

## 2018-09-26 DIAGNOSIS — D75839 Thrombocytosis, unspecified: Secondary | ICD-10-CM

## 2018-09-26 DIAGNOSIS — I10 Essential (primary) hypertension: Secondary | ICD-10-CM

## 2018-09-26 DIAGNOSIS — I129 Hypertensive chronic kidney disease with stage 1 through stage 4 chronic kidney disease, or unspecified chronic kidney disease: Secondary | ICD-10-CM

## 2018-09-26 DIAGNOSIS — I700 Atherosclerosis of aorta: Secondary | ICD-10-CM | POA: Diagnosis not present

## 2018-09-26 DIAGNOSIS — K219 Gastro-esophageal reflux disease without esophagitis: Secondary | ICD-10-CM | POA: Diagnosis not present

## 2018-09-26 DIAGNOSIS — R5383 Other fatigue: Secondary | ICD-10-CM

## 2018-09-26 DIAGNOSIS — R441 Visual hallucinations: Secondary | ICD-10-CM

## 2018-09-26 DIAGNOSIS — N1832 Chronic kidney disease, stage 3b: Secondary | ICD-10-CM

## 2018-09-26 NOTE — Patient Instructions (Addendum)
Medicare Annual Wellness Visit  [facility] and the medical providers at [facility] strive to bring you the best medical care.  In doing so we not only want to address your current medical conditions and concerns but also to detect new conditions early and prevent illness, disease and health-related problems.    Medicare offers a yearly Wellness Visit which allows our clinical staff to assess your need for preventative services including immunizations, lifestyle education, counseling to decrease risk of preventable diseases and screening for fall risk and other medical concerns.    This visit is provided free of charge (no copay) for all Medicare recipients. The clinical pharmacists at [facility] have begun to conduct these Wellness Visits which will also include a thorough review of all your medications.    As you primary medical provider recommend that you make an appointment for your Annual Wellness Visit if you have not done so already this year.  You may set up this appointment before you leave today or you may call back ([phone]) and schedule an appointment.  Please make sure when you call that you mention that you are scheduling your Annual Wellness Visit with the clinical pharmacist so that the appointment may be made for the proper length of time.     Continue current medications. Continue good therapeutic lifestyle changes which include good diet and exercise. Fall precautions discussed with patient. If an FOBT was given today- please return it to our front desk. If you are over 76 years old - you may need Prevnar 61 or the adult Pneumonia vaccine.  **Flu shots are available--- please call and schedule a FLU-CLINIC appointment**  After your visit with [place] today you will receive a survey in the mail or online from [employer] regarding your care with [place]. Please take a moment to fill this out. Your feedback is very  important to [place] as you can help [place] better understand your patient needs as well as improve your experience and satisfaction. WE CARE ABOUT YOU!!!   Continue to drink plenty of fluids We will follow-up on the hallucinations once we get all of the blood work back and we will also schedule you to meet with the clinical pharmacist about any drug interactions that you could be having with all the medicines that you are currently taking.  I think this would be most fruitful. The patient should continue to watch her sodium intake We will not make any changes with the metoprolol until the visit with the clinical pharmacist and not before we discussed that with the cardiologist. Record and monitor blood pressure and pulse readings at home. Continue to follow-up with hematology We will call with lab work results as soon as those results become available

## 2018-09-26 NOTE — Progress Notes (Signed)
Subjective:    Patient ID: Kristy [name], female    DOB: 13-Mar-1928, 84 y.o.   MRN: [id number]  HPI Pt here for follow up and management of chronic medical problems which includes hypertension and a fib. She is taking medication regularly.  The patient today complains of some tiredness and fatigue.  She was given an FOBT to return and will get lab work today and will get her flu shot today.  She is wondering if she can reduce her metoprolol dose.  She is currently taking 25 of Toprol-XL once daily.  She is also on HCTZ 25 daily.  She is on Xarelto because of fibrillation.  Her initial blood pressure today was good and her pulse rate was 56.  Her O2 level was 96% on room air.  The patient is pleasant today but worried about some hallucinations that she is been having recently especially after taking short naps in the afternoon that she will wake up and see family members in the room briefly and then she realizes this and then she is okay.  She is wondering if the Hydrea that she is taking could be causing this.  She is also concerned about any drug interactions including sotalol Toprol vitamin D and Prilosec.  She would probably benefit with a visit with a clinical pharmacist to review all of the drug interactions that she is having and make changes if necessary.  She denies any chest pain but does have some shortness of breath and she attributes this to her large hiatal hernia that she has a long history of having had this.  She does have some occasional loose stools and this is normal for her she denies any black tarry bowel movements or blood in the stool.  She denies any trouble with passing her water though she does not empty fully and has to sit there to continue to empty.  She has no burning or pain with voiding.    Patient Active Problem List   Diagnosis Date Noted  . Non-rheumatic tricuspid valve insufficiency 07/15/2018  . Tick bite 05/10/2017  . Thoracic aortic atherosclerosis ([place])  11/06/2016  . Metabolic syndrome 56/81/2751  . 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 Essential thrombocytosis ([place]) 01/24/2016  . PAF (paroxysmal atrial fibrillation) ([place]) 12/18/2013  . Cervical spondylosis without myelopathy 04/27/2013  . ABDOMINAL PAIN, UNSPECIFIED SITE 02/02/2011  . Palpitations 02/26/2010  . COLONIC POLYPS, HYPERPLASTIC 12/07/2007  . LIPOMA 12/07/2007  . OBESITY 12/07/2007  . Essential hypertension 12/07/2007  . GERD 12/07/2007  . HIATAL HERNIA 12/07/2007  . DIVERTICULOSIS, COLON 12/07/2007  . FATTY LIVER DISEASE 12/07/2007  . ENDOMETRIAL POLYP 12/07/2007  . COMPLEX ENDOMETRIAL HYPERPLASIA WITHOUT ATYPIA 12/07/2007  . SOB 12/07/2007   Outpatient Encounter Medications as of 09/26/2018  Medication Sig  . ALPRAZolam (XANAX) 0.25 MG tablet Take 1 tablet (0.25 mg total) by mouth daily as needed for anxiety. (Patient taking differently: Take 0.125 mg by mouth daily as needed for anxiety. )  . Cholecalciferol (VITAMIN D3) 2000 UNITS TABS Take 2,000 Units by mouth daily.   Marland Kitchen estradiol (CLIMARA - DOSED IN MG/24 HR) 0.025 mg/24hr patch APPLY 1 PATCH TOPICALLY TO  SKIN WEEKLY (REMOVE OLD  PATCH EACH WEEK)  . glucose blood (ONE TOUCH ULTRA TEST) test strip CHECK BLOOD SUGAR ONCE DAILY  . hydrochlorothiazide (HYDRODIURIL) 25 MG tablet TAKE 1 TABLET BY MOUTH  DAILY  . hydroxyurea (HYDREA) 500 MG capsule Take 1 capsule (500 mg total) by mouth daily. On Monday &  Thursday only  . metoprolol succinate (TOPROL-XL) 25 MG 24 hr tablet TAKE 1 TABLET BY MOUTH TWO  TIMES DAILY  . Multiple Vitamins-Minerals (EYE VITAMINS PO) Take 1 tablet by mouth daily.  Marland Kitchen omeprazole (PRILOSEC) 20 MG capsule TAKE 1 CAPSULE BY MOUTH  DAILY (Needs to be seen)  . Polyethyl Glycol-Propyl Glycol (SYSTANE OP) Apply 1-2 drops to eye 4 (four) times daily as needed (dry eyes).  . sertraline (ZOLOFT) 50 MG tablet TAKE 1 AND 1/2 TABLETS BY  MOUTH ONCE DAILY (Needs to be seen)  . timolol (TIMOPTIC) 0.5 % ophthalmic  solution Place 1 drop into both eyes daily.   Alveda Reasons 15 MG TABS tablet TAKE 1 TABLET BY MOUTH  DAILY   No facility-administered encounter medications on file as of 09/26/2018.       Review of Systems  Constitutional: Positive for fatigue (has question about metoprolol).  HENT: Negative.   Eyes: Negative.   Respiratory: Positive for shortness of breath. Stridor: worse with exertion    Cardiovascular: Negative.   Gastrointestinal: Negative.   Endocrine: Negative.   Genitourinary: Negative.   Musculoskeletal: Negative.   Skin: Negative.   Allergic/Immunologic: Negative.   Neurological: Negative.   Hematological: Negative.   Psychiatric/Behavioral: Negative.        Objective:   Physical Exam  Constitutional: She is oriented to person, place, and time. She appears well-developed and well-nourished. No distress.  The patient is pleasant and alert but worried about any medications that she is taking or any drug interactions that could be playing a role with causing her to have visual hallucinations.  HENT:  Head: Normocephalic and atraumatic.  Right Ear: External ear normal.  Left Ear: External ear normal.  Nose: Nose normal.  Mouth/Throat: Oropharynx is clear and moist.  Eyes: Pupils are equal, round, and reactive to light. Conjunctivae and EOM are normal. Right eye exhibits no discharge. Left eye exhibits no discharge. No scleral icterus.  Neck: Normal range of motion. Neck supple. No thyromegaly present.  No bruits thyromegaly or anterior cervical adenopathy  Cardiovascular: Normal rate, regular rhythm, normal heart sounds and intact distal pulses.  No murmur heard. The heart is regular at 60/min.  The patient just saw the cardiologist in September or so she said and she sees him every 6 months.  Good pedal pulses bilaterally with minimal or no edema.  Pulmonary/Chest: Effort normal and breath sounds normal. She has no wheezes. She has no rales.  Lungs are clear front to back   Abdominal: Soft. Bowel sounds are normal. She exhibits no mass. There is no tenderness. There is no rebound and no guarding.  No liver or spleen enlargement.  No epigastric tenderness no masses no bruits.  Musculoskeletal: Normal range of motion. She exhibits deformity. She exhibits no edema.  Some kyphosis.  Lymphadenopathy:    She has no cervical adenopathy.  Neurological: She is alert and oriented to person, place, and time. She has normal reflexes. No cranial nerve deficit.  Skin: Skin is warm and dry. No rash noted.  Psychiatric: She has a normal mood and affect. Her behavior is normal. Judgment and thought content normal.  Mood affect and behavior were perfectly normal for this patient.  Nursing note and vitals reviewed.  BP 138/69 (BP Location: Left Arm)   Pulse (!) 56   Temp (!) 96.8 F (36 C) (Oral)   Ht 5' 2"  (1.575 m)   Wt 167 lb (75.8 kg)   SpO2 96% Comment: room air  BMI  30.54 kg/m         Assessment & Plan:  1. Essential hypertension -The blood pressure was good today and she will continue with current treatment and monitoring blood pressure and pulse rates at home and recording these. - CBC with Differential/Platelet - BMP8+EGFR - Hepatic function panel  2. Thoracic aortic atherosclerosis ([place]) -Continue with as aggressive therapeutic lifestyle changes as possible to achieve weight loss and cholesterol control - CBC with Differential/Platelet - Lipid panel - Hepatic function panel  3. Paroxysmal atrial fibrillation (H[facility]) -Continue to follow-up with cardiology.  Heart had a regular rate and rhythm today at 60/min - CBC with Differential/Platelet  4. Chronic kidney disease (CKD) stage G3b/A2, moderately decreased glomerular filtration rate (GFR) between 30-44 mL/min/1.73 square meter and albuminuria creatinine ratio between 30-299 mg/g (HCC) - CBC with Differential/Platelet - BMP8+EGFR  5. Gastroesophageal reflux disease, esophagitis presence not  specified -Patient will continue with Prilosec and may consider switching this to Protonix based on visit to clinical pharmacist in near future. - CBC with Differential/Platelet  6. Vitamin D deficiency -Continue with vitamin D replacement pending results of lab work - CBC with Differential/Platelet - VITAMIN D 25 Hydroxy (Vit-D Deficiency, Fractures)  7. Metabolic syndrome -Continue with healthy eating and with exercise as much as possible - CBC with Differential/Platelet  8. Other fatigue - Thyroid Panel With TSH - Vitamin B12 - Urinalysis, Complete - Urine Culture  9. SOB (shortness of breath) on exertion - CBC with Differential/Platelet - BMP8+EGFR - Thyroid Panel With TSH - Vitamin B12  10. Hallucinations, visual -Could be polypharmacy.  Will schedule visit with clinical pharmacist to review all medicines and possible drug interactions.  11. Stage 3 chronic kidney disease ([place]) -Avoid anti-inflammatory medicines and keep blood pressure under the best control possible  12. Thrombocytosis ([place]) -Follow-up with hematology as planned  13. Benign hypertension with chronic kidney disease, stage III ([place]) -Continue current treatment pending visit to clinical pharmacy and before any changes are made with metoprolol we will discuss these potential changes with the cardiologist.  Patient Instructions                       Medicare Annual Wellness Visit  [facility] and the medical providers at [facility] strive to bring you the best medical care.  In doing so we not only want to address your current medical conditions and concerns but also to detect new conditions early and prevent illness, disease and health-related problems.    Medicare offers a yearly Wellness Visit which allows our clinical staff to assess your need for preventative services including immunizations, lifestyle education, counseling to decrease risk of preventable diseases and screening  for fall risk and other medical concerns.    This visit is provided free of charge (no copay) for all Medicare recipients. The clinical pharmacists at [facility] have begun to conduct these Wellness Visits which will also include a thorough review of all your medications.    As you primary medical provider recommend that you make an appointment for your Annual Wellness Visit if you have not done so already this year.  You may set up this appointment before you leave today or you may call back ([phone]) and schedule an appointment.  Please make sure when you call that you mention that you are scheduling your Annual Wellness Visit with the clinical pharmacist so that the appointment may be made for the proper length of time.  Continue current medications. Continue good therapeutic lifestyle changes which include good diet and exercise. Fall precautions discussed with patient. If an FOBT was given today- please return it to our front desk. If you are over 23 years old - you may need Prevnar 26 or the adult Pneumonia vaccine.  **Flu shots are available--- please call and schedule a FLU-CLINIC appointment**  After your visit with [place] today you will receive a survey in the mail or online from [employer] regarding your care with [place]. Please take a moment to fill this out. Your feedback is very important to [place] as you can help [place] better understand your patient needs as well as improve your experience and satisfaction. WE CARE ABOUT YOU!!!   Continue to drink plenty of fluids We will follow-up on the hallucinations once we get all of the blood work back and we will also schedule you to meet with the clinical pharmacist about any drug interactions that you could be having with all the medicines that you are currently taking.  I think this would be most fruitful. The patient should continue to watch her sodium intake We will not make any changes with the metoprolol until the visit  with the clinical pharmacist and not before we discussed that with the cardiologist. Record and monitor blood pressure and pulse readings at home. Continue to follow-up with hematology We will call with lab work results as soon as those results become available  Arrie Senate MD

## 2018-09-27 LAB — BMP8+EGFR
BUN/Creatinine Ratio: 18 (ref 12–28)
BUN: 21 mg/dL (ref 8–27)
CO2: 25 mmol/L (ref 20–29)
Calcium: 9.8 mg/dL (ref 8.7–10.3)
Chloride: 100 mmol/L (ref 96–106)
Creatinine, Ser: 1.18 mg/dL — ABNORMAL HIGH (ref 0.57–1.00)
GFR calc Af Amer: 48 mL/min/{1.73_m2} — ABNORMAL LOW (ref >59)
GFR calc non Af Amer: 42 mL/min/{1.73_m2} — ABNORMAL LOW (ref >59)
Glucose: 87 mg/dL (ref 65–99)
Potassium: 4.3 mmol/L (ref 3.5–5.2)
Sodium: 143 mmol/L (ref 134–144)

## 2018-09-27 LAB — LIPID PANEL
Chol/HDL Ratio: 3.5 ratio (ref 0.0–4.4)
Cholesterol, Total: 180 mg/dL (ref 100–199)
HDL: 51 mg/dL (ref >39)
LDL Calculated: 106 mg/dL — ABNORMAL HIGH (ref 0–99)
Triglycerides: 114 mg/dL (ref 0–149)
VLDL Cholesterol Cal: 23 mg/dL (ref 5–40)

## 2018-09-27 LAB — HEPATIC FUNCTION PANEL
ALT: 18 IU/L (ref 0–32)
AST: 29 IU/L (ref 0–40)
Albumin: 4.9 g/dL — ABNORMAL HIGH (ref 3.5–4.7)
Alkaline Phosphatase: 57 IU/L (ref 39–117)
Bilirubin Total: 0.6 mg/dL (ref 0.0–1.2)
Bilirubin, Direct: 0.17 mg/dL (ref 0.00–0.40)
Total Protein: 6.8 g/dL (ref 6.0–8.5)

## 2018-09-27 LAB — CBC WITH DIFFERENTIAL/PLATELET
BASOS ABS: 0.1 10*3/uL (ref 0.0–0.2)
Basos: 1 %
EOS (ABSOLUTE): 0.1 10*3/uL (ref 0.0–0.4)
Eos: 1 %
Hematocrit: 40.1 % (ref 34.0–46.6)
Hemoglobin: 13.2 g/dL (ref 11.1–15.9)
Immature Grans (Abs): 0 10*3/uL (ref 0.0–0.1)
Immature Granulocytes: 0 %
LYMPHS ABS: 1.5 10*3/uL (ref 0.7–3.1)
Lymphs: 21 %
MCH: 31.1 pg (ref 26.6–33.0)
MCHC: 32.9 g/dL (ref 31.5–35.7)
MCV: 94 fL (ref 79–97)
Monocytes Absolute: 0.6 10*3/uL (ref 0.1–0.9)
Monocytes: 9 %
Neutrophils Absolute: 4.8 10*3/uL (ref 1.4–7.0)
Neutrophils: 68 %
PLATELETS: 432 10*3/uL (ref 150–450)
RBC: 4.25 x10E6/uL (ref 3.77–5.28)
RDW: 13.8 % (ref 12.3–15.4)
WBC: 7.1 10*3/uL (ref 3.4–10.8)

## 2018-09-27 LAB — VITAMIN D 25 HYDROXY (VIT D DEFICIENCY, FRACTURES): Vit D, 25-Hydroxy: 58.4 ng/mL (ref 30.0–100.0)

## 2018-09-27 LAB — THYROID PANEL WITH TSH
Free Thyroxine Index: 2.3 (ref 1.2–4.9)
T3 Uptake Ratio: 27 % (ref 24–39)
T4, Total: 8.5 ug/dL (ref 4.5–12.0)
TSH: 1.94 u[IU]/mL (ref 0.450–4.500)

## 2018-09-27 LAB — VITAMIN B12: Vitamin B-12: 581 pg/mL (ref 232–1245)

## 2018-09-28 ENCOUNTER — Ambulatory Visit (INDEPENDENT_AMBULATORY_CARE_PROVIDER_SITE_OTHER): Payer: Medicare Other | Admitting: Pharmacist Clinician (PhC)/ Clinical Pharmacy Specialist

## 2018-09-28 VITALS — BP 143/72 | HR 76

## 2018-09-28 DIAGNOSIS — R300 Dysuria: Secondary | ICD-10-CM

## 2018-09-28 DIAGNOSIS — N183 Chronic kidney disease, stage 3 (moderate): Secondary | ICD-10-CM | POA: Diagnosis not present

## 2018-09-28 DIAGNOSIS — N1832 Chronic kidney disease, stage 3b: Secondary | ICD-10-CM

## 2018-09-28 DIAGNOSIS — I10 Essential (primary) hypertension: Secondary | ICD-10-CM | POA: Diagnosis not present

## 2018-09-28 DIAGNOSIS — Z1211 Encounter for screening for malignant neoplasm of colon: Secondary | ICD-10-CM | POA: Diagnosis not present

## 2018-09-28 LAB — MICROSCOPIC EXAMINATION
Epithelial Cells (non renal): >10 /hpf — AB (ref 0–10)
Renal Epithel, UA: NONE SEEN /hpf

## 2018-09-28 LAB — URINALYSIS, COMPLETE
BILIRUBIN UA: NEGATIVE
Glucose, UA: NEGATIVE
KETONES UA: NEGATIVE
NITRITE UA: POSITIVE — AB
PROTEIN UA: NEGATIVE
RBC UA: NEGATIVE
Specific Gravity, UA: 1.015 (ref 1.005–1.030)
UUROB: 0.2 mg/dL (ref 0.2–1.0)
pH, UA: 7 (ref 5.0–7.5)

## 2018-09-28 MED ORDER — METOPROLOL SUCCINATE ER 25 MG PO TB24: 25.0000 mg | ORAL_TABLET | Freq: Every day | ORAL | 0 refills | Status: DC

## 2018-09-28 NOTE — Progress Notes (Addendum)
Had [facility] Spotted fever a year ago and has not felt good since that time.  Patient cut her metoprolol on her own two days ago to 1 tablet and already feels better.  Not as tired and dizzy. Was increasd to twice a day of metoprolol October 2018.  Omeprazole can decreased metabolism of metoprolol and alprazolam.  Decreased renal function (CKD 3b) can lead to worsening edema with estradiol patches.  Patient states she has taken estrogen since menopause.    A/P:  1.  Continue taking metoprolol 25mg  daily.  Will need to monitor systolic hypertension to prevent worsening CKD.  BP today is 143/72 and P 72.  Pulse is already much improved since cutting metoprolol dose down.  2.  An ACEI or ARB should be considered for systolic hypertension and CKD. An initial increase in serum creatinine can be seen when first started.  3.  Omeprazole can increase alprazolam and metoprolol concentrations.  She has a hiatal hernia.  4.  Discontinue estradiol patches since she should no longer need them at age 84 and also due to the fact she has edema which is a side effect more prone with you also have CKD.  Total time with patient 45 minutes Memory Argue, PharmD. CPP. CLS

## 2018-09-28 NOTE — Addendum Note (Signed)
Addended by: Zannie Cove on: 09/28/2018 09:45 AM   Modules accepted: Orders

## 2018-09-30 LAB — URINE CULTURE

## 2018-10-02 LAB — FECAL OCCULT BLOOD, IMMUNOCHEMICAL: FECAL OCCULT BLD: POSITIVE — AB

## 2018-10-06 ENCOUNTER — Other Ambulatory Visit: Payer: Self-pay

## 2018-10-06 ENCOUNTER — Other Ambulatory Visit: Payer: Self-pay | Admitting: Family Medicine

## 2018-10-06 MED ORDER — HYDROCHLOROTHIAZIDE 25 MG PO TABS: 25.0000 mg | ORAL_TABLET | Freq: Every day | ORAL | 0 refills | Status: DC

## 2018-10-11 DIAGNOSIS — C44319 Basal cell carcinoma of skin of other parts of face: Secondary | ICD-10-CM | POA: Diagnosis not present

## 2018-10-11 DIAGNOSIS — D225 Melanocytic nevi of trunk: Secondary | ICD-10-CM | POA: Diagnosis not present

## 2018-10-11 DIAGNOSIS — D1801 Hemangioma of skin and subcutaneous tissue: Secondary | ICD-10-CM | POA: Diagnosis not present

## 2018-10-11 DIAGNOSIS — D2272 Melanocytic nevi of left lower limb, including hip: Secondary | ICD-10-CM | POA: Diagnosis not present

## 2018-10-11 DIAGNOSIS — C44311 Basal cell carcinoma of skin of nose: Secondary | ICD-10-CM | POA: Diagnosis not present

## 2018-10-11 DIAGNOSIS — L821 Other seborrheic keratosis: Secondary | ICD-10-CM | POA: Diagnosis not present

## 2018-10-11 DIAGNOSIS — Z85828 Personal history of other malignant neoplasm of skin: Secondary | ICD-10-CM | POA: Diagnosis not present

## 2018-10-18 ENCOUNTER — Other Ambulatory Visit: Payer: Medicare Other

## 2018-10-18 ENCOUNTER — Other Ambulatory Visit: Payer: Self-pay | Admitting: *Deleted

## 2018-10-18 DIAGNOSIS — K921 Melena: Secondary | ICD-10-CM

## 2018-10-18 LAB — HEMOGLOBIN, FINGERSTICK: Hemoglobin: 13.2 g/dL (ref 11.1–15.9)

## 2018-10-19 LAB — FECAL OCCULT BLOOD, IMMUNOCHEMICAL: FECAL OCCULT BLD: NEGATIVE

## 2018-10-26 ENCOUNTER — Encounter: Payer: Self-pay | Admitting: Family Medicine

## 2018-10-26 ENCOUNTER — Ambulatory Visit (INDEPENDENT_AMBULATORY_CARE_PROVIDER_SITE_OTHER): Payer: Medicare Other | Admitting: Family Medicine

## 2018-10-26 VITALS — BP 151/71 | HR 66 | Temp 96.8°F | Ht 62.0 in | Wt 166.0 lb

## 2018-10-26 DIAGNOSIS — I480 Paroxysmal atrial fibrillation: Secondary | ICD-10-CM

## 2018-10-26 DIAGNOSIS — K219 Gastro-esophageal reflux disease without esophagitis: Secondary | ICD-10-CM

## 2018-10-26 DIAGNOSIS — I10 Essential (primary) hypertension: Secondary | ICD-10-CM

## 2018-10-26 DIAGNOSIS — N183 Chronic kidney disease, stage 3 (moderate): Secondary | ICD-10-CM

## 2018-10-26 DIAGNOSIS — I700 Atherosclerosis of aorta: Secondary | ICD-10-CM | POA: Diagnosis not present

## 2018-10-26 DIAGNOSIS — N1832 Chronic kidney disease, stage 3b: Secondary | ICD-10-CM

## 2018-10-26 NOTE — Patient Instructions (Signed)
Continue with current treatment regimen Check blood pressures regularly Watch salt intake closely Be careful not to put self at risk for falling and move slowly especially when arising from a supine or sitting position to a standing position. Follow-up with cardiology as planned Follow-up with hematology as planned

## 2018-10-26 NOTE — Progress Notes (Signed)
Subjective:    Patient ID: Kristy [name], female    DOB: [birth year], 84 y.o.   MRN: [id number]  HPI Patient here today for 4 week follow up on fatigue, polypharmacy and SOB.  The patient did see the clinical pharmacist and several adjustments were made to her treatment regimen.  Her fatigue is better.  She still has some shortness of breath and still has some hallucinations.  She has discontinued her hormone and continues to take metoprolol just 25 mg daily.  Her blood pressure is slightly elevated today at 151/71.  Weight is stable and actually down 1 pound.  We also discussed with her ophthalmologist about the HCTZ and he agreed that she should continue to take this to keep her blood pressure under the best control possible.  She continues to take alprazolam as needed and Zoloft for depression.  She has a history of paroxysmal atrial fibrillation and thrombocytosis.  She is seeing the hematologist for the thrombocytosis.  Patient today is doing better and feeling better since the medication changes have occurred.  She denies any chest pain.  She still has ongoing shortness of breath but no worse than usual.  She takes her Prilosec or omeprazole regularly for her large hiatal hernia and is currently having no problems with that or with her bowel movements or change in bowel habits.  She is passing her water without problems.  She does not drink caffeine.  She is a joy and pleasure to look after and she is positive and keeping her mind busy and reading and doing crossword puzzles etc.    Patient Active Problem List   Diagnosis Date Noted  . Non-rheumatic tricuspid valve insufficiency 07/15/2018  . Tick bite 05/10/2017  . Thoracic aortic atherosclerosis ([place]) 11/06/2016  . Metabolic syndrome 70/26/3785  . 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 Essential thrombocytosis ([place]) 01/24/2016  . PAF (paroxysmal atrial fibrillation) ([place]) 12/18/2013  . Cervical spondylosis without myelopathy 04/27/2013  .  ABDOMINAL PAIN, UNSPECIFIED SITE 02/02/2011  . Palpitations 02/26/2010  . COLONIC POLYPS, HYPERPLASTIC 12/07/2007  . LIPOMA 12/07/2007  . OBESITY 12/07/2007  . Essential hypertension 12/07/2007  . GERD 12/07/2007  . HIATAL HERNIA 12/07/2007  . DIVERTICULOSIS, COLON 12/07/2007  . FATTY LIVER DISEASE 12/07/2007  . ENDOMETRIAL POLYP 12/07/2007  . COMPLEX ENDOMETRIAL HYPERPLASIA WITHOUT ATYPIA 12/07/2007  . SOB 12/07/2007   Outpatient Encounter Medications as of 10/26/2018  Medication Sig  . ALPRAZolam (XANAX) 0.25 MG tablet Take 1 tablet (0.25 mg total) by mouth daily as needed for anxiety. (Patient taking differently: Take 0.125 mg by mouth daily as needed for anxiety. )  . Cholecalciferol (VITAMIN D3) 2000 UNITS TABS Take 2,000 Units by mouth daily.   Marland Kitchen glucose blood (ONE TOUCH ULTRA TEST) test strip CHECK BLOOD SUGAR ONCE DAILY  . hydrochlorothiazide (HYDRODIURIL) 25 MG tablet Take 1 tablet (25 mg total) by mouth daily.  . hydroxyurea (HYDREA) 500 MG capsule Take 1 capsule (500 mg total) by mouth daily. On Monday & Thursday only  . metoprolol succinate (TOPROL-XL) 25 MG 24 hr tablet TAKE 1 TABLET BY MOUTH TWO  TIMES DAILY (Patient taking differently: Take 25 mg by mouth daily. )  . Multiple Vitamins-Minerals (EYE VITAMINS PO) Take 1 tablet by mouth daily.  Marland Kitchen omeprazole (PRILOSEC) 20 MG capsule TAKE 1 CAPSULE BY MOUTH  DAILY  . Polyethyl Glycol-Propyl Glycol (SYSTANE OP) Apply 1-2 drops to eye 4 (four) times daily as needed (dry eyes).  . sertraline (ZOLOFT) 50 MG tablet TAKE  1 AND 1/2 TABLETS BY  MOUTH ONCE DAILY (Needs to be seen)  . timolol (TIMOPTIC) 0.5 % ophthalmic solution Place 1 drop into both eyes daily.   Alveda Reasons 15 MG TABS tablet TAKE 1 TABLET BY MOUTH  DAILY  . [DISCONTINUED] estradiol (CLIMARA - DOSED IN MG/24 HR) 0.025 mg/24hr patch APPLY 1 PATCH TOPICALLY TO  SKIN WEEKLY (REMOVE OLD  PATCH EACH WEEK)   No facility-administered encounter medications on file as of  10/26/2018.       Review of Systems  Constitutional: Negative.  Fatigue: better.  HENT: Negative.   Eyes: Negative.   Respiratory: Positive for shortness of breath.   Cardiovascular: Negative.   Gastrointestinal: Negative.   Endocrine: Negative.   Genitourinary: Negative.   Musculoskeletal: Negative.   Skin: Negative.   Allergic/Immunologic: Negative.   Neurological: Negative.   Hematological: Negative.   Psychiatric/Behavioral: Negative.        Objective:   Physical Exam  Constitutional: She is oriented to person, place, and time. She appears well-developed and well-nourished. No distress.  The patient is elderly and pleasant and looks much younger than her stated age of 58 years.  HENT:  Head: Normocephalic and atraumatic.  Right Ear: External ear normal.  Left Ear: External ear normal.  Nose: Nose normal.  Mouth/Throat: Oropharynx is clear and moist.  Eyes: Pupils are equal, round, and reactive to light. Conjunctivae and EOM are normal. Right eye exhibits no discharge. Left eye exhibits no discharge. No scleral icterus.  Neck: Normal range of motion. Neck supple. No thyromegaly present.  Cardiovascular: Normal rate, regular rhythm, normal heart sounds and intact distal pulses.  No murmur heard. The heart is regular with a rare irregularity at 60/min  Pulmonary/Chest: Effort normal and breath sounds normal. She has no wheezes. She has no rales.  Clear anteriorly and posteriorly  Abdominal: Soft. Bowel sounds are normal. She exhibits no mass. There is no tenderness.  No tenderness masses organ enlargement or bruit  Musculoskeletal: Normal range of motion. She exhibits no edema.  Lymphadenopathy:    She has no cervical adenopathy.  Neurological: She is alert and oriented to person, place, and time. She has normal reflexes.  Is alert and moving all extremities well.  Skin: Skin is warm and dry. No rash noted.  Psychiatric: She has a normal mood and affect. Her behavior  is normal. Judgment and thought content normal.  Mood affect and behavior are all normal for her.  Nursing note and vitals reviewed.  BP (!) 151/71 (BP Location: Left Arm)   Pulse 66   Temp (!) 96.8 F (36 C) (Oral)   Ht 5\' 2"  (1.575 m)   Wt 166 lb (75.3 kg)   BMI 30.36 kg/m         Assessment & Plan:  1. Paroxysmal atrial fibrillation (HCC) -Follow-up with cardiology as planned  2. Essential hypertension -Continue to monitor blood pressures regularly at home watch sodium intake and if the systolic remains in the 008Q low dose ACE inhibitor may be appropriate to try for her.  3. Chronic kidney disease (CKD) stage G3b/A2, moderately decreased glomerular filtration rate (GFR) between 30-44 mL/min/1.73 square meter and albuminuria creatinine ratio between 30-299 mg/g (HCC) -Continue to check BMP regularly and keep blood pressure under best control possible  4. Thoracic aortic atherosclerosis ([place]) -Continue with as aggressive therapeutic lifestyle changes as possible and watching diet closely for cholesterol  5. Gastroesophageal reflux disease, esophagitis presence not specified -Continue omeprazole because of the large  hiatal hernia that she has.  Patient Instructions  Continue with current treatment regimen Check blood pressures regularly Watch salt intake closely Be careful not to put self at risk for falling and move slowly especially when arising from a supine or sitting position to a standing position. Follow-up with cardiology as planned Follow-up with hematology as planned  Arrie Senate MD

## 2018-11-10 DIAGNOSIS — H04123 Dry eye syndrome of bilateral lacrimal glands: Secondary | ICD-10-CM | POA: Diagnosis not present

## 2018-11-10 DIAGNOSIS — Z961 Presence of intraocular lens: Secondary | ICD-10-CM | POA: Diagnosis not present

## 2018-11-10 DIAGNOSIS — H401233 Low-tension glaucoma, bilateral, severe stage: Secondary | ICD-10-CM | POA: Diagnosis not present

## 2018-11-10 DIAGNOSIS — H47013 Ischemic optic neuropathy, bilateral: Secondary | ICD-10-CM | POA: Diagnosis not present

## 2018-12-06 ENCOUNTER — Ambulatory Visit (HOSPITAL_COMMUNITY): Payer: Medicare Other | Admitting: Internal Medicine

## 2018-12-06 ENCOUNTER — Other Ambulatory Visit (HOSPITAL_COMMUNITY): Payer: Medicare Other

## 2018-12-12 ENCOUNTER — Other Ambulatory Visit (HOSPITAL_COMMUNITY): Payer: Medicare Other

## 2018-12-12 ENCOUNTER — Ambulatory Visit (HOSPITAL_COMMUNITY): Payer: Medicare Other | Admitting: Internal Medicine

## 2018-12-13 ENCOUNTER — Inpatient Hospital Stay (HOSPITAL_COMMUNITY): Payer: Medicare Other | Attending: Hematology

## 2018-12-13 ENCOUNTER — Encounter (HOSPITAL_COMMUNITY): Payer: Self-pay | Admitting: Internal Medicine

## 2018-12-13 ENCOUNTER — Other Ambulatory Visit: Payer: Self-pay

## 2018-12-13 ENCOUNTER — Inpatient Hospital Stay (HOSPITAL_COMMUNITY): Payer: Medicare Other | Attending: Internal Medicine | Admitting: Internal Medicine

## 2018-12-13 VITALS — BP 134/56 | HR 65 | Temp 98.1°F | Resp 16 | Wt 168.2 lb

## 2018-12-13 DIAGNOSIS — Z87891 Personal history of nicotine dependence: Secondary | ICD-10-CM | POA: Diagnosis not present

## 2018-12-13 DIAGNOSIS — Z79899 Other long term (current) drug therapy: Secondary | ICD-10-CM | POA: Insufficient documentation

## 2018-12-13 DIAGNOSIS — Z7901 Long term (current) use of anticoagulants: Secondary | ICD-10-CM | POA: Insufficient documentation

## 2018-12-13 DIAGNOSIS — I4891 Unspecified atrial fibrillation: Secondary | ICD-10-CM | POA: Diagnosis not present

## 2018-12-13 DIAGNOSIS — C946 Myelodysplastic disease, not classified: Secondary | ICD-10-CM | POA: Insufficient documentation

## 2018-12-13 DIAGNOSIS — D473 Essential (hemorrhagic) thrombocythemia: Secondary | ICD-10-CM

## 2018-12-13 DIAGNOSIS — R443 Hallucinations, unspecified: Secondary | ICD-10-CM | POA: Insufficient documentation

## 2018-12-13 DIAGNOSIS — I10 Essential (primary) hypertension: Secondary | ICD-10-CM | POA: Diagnosis not present

## 2018-12-13 DIAGNOSIS — I480 Paroxysmal atrial fibrillation: Secondary | ICD-10-CM | POA: Diagnosis not present

## 2018-12-13 DIAGNOSIS — F4321 Adjustment disorder with depressed mood: Secondary | ICD-10-CM | POA: Diagnosis not present

## 2018-12-13 DIAGNOSIS — Z1589 Genetic susceptibility to other disease: Secondary | ICD-10-CM

## 2018-12-13 LAB — COMPREHENSIVE METABOLIC PANEL
ALK PHOS: 52 U/L (ref 38–126)
ALT: 18 U/L (ref 0–44)
AST: 27 U/L (ref 15–41)
Albumin: 4.2 g/dL (ref 3.5–5.0)
Anion gap: 8 (ref 5–15)
BILIRUBIN TOTAL: 0.8 mg/dL (ref 0.3–1.2)
BUN: 22 mg/dL (ref 8–23)
CO2: 28 mmol/L (ref 22–32)
CREATININE: 1.19 mg/dL — AB (ref 0.44–1.00)
Calcium: 9.5 mg/dL (ref 8.9–10.3)
Chloride: 107 mmol/L (ref 98–111)
GFR calc Af Amer: 47 mL/min — ABNORMAL LOW (ref >60)
GFR, EST NON AFRICAN AMERICAN: 41 mL/min — AB (ref >60)
GLUCOSE: 103 mg/dL — AB (ref 70–99)
Potassium: 4.2 mmol/L (ref 3.5–5.1)
Sodium: 143 mmol/L (ref 135–145)
TOTAL PROTEIN: 6.9 g/dL (ref 6.5–8.1)

## 2018-12-13 LAB — CBC WITH DIFFERENTIAL/PLATELET
ABS IMMATURE GRANULOCYTES: 0.02 10*3/uL (ref 0.00–0.07)
BASOS ABS: 0 10*3/uL (ref 0.0–0.1)
Basophils Relative: 1 %
EOS ABS: 0.1 10*3/uL (ref 0.0–0.5)
Eosinophils Relative: 2 %
HEMATOCRIT: 41.5 % (ref 36.0–46.0)
HEMOGLOBIN: 13.1 g/dL (ref 12.0–15.0)
IMMATURE GRANULOCYTES: 0 %
LYMPHS ABS: 1.3 10*3/uL (ref 0.7–4.0)
LYMPHS PCT: 21 %
MCH: 30.2 pg (ref 26.0–34.0)
MCHC: 31.6 g/dL (ref 30.0–36.0)
MCV: 95.6 fL (ref 80.0–100.0)
MONOS PCT: 9 %
Monocytes Absolute: 0.5 10*3/uL (ref 0.1–1.0)
Neutro Abs: 4.1 10*3/uL (ref 1.7–7.7)
Neutrophils Relative %: 67 %
Platelets: 398 10*3/uL (ref 150–400)
RBC: 4.34 MIL/uL (ref 3.87–5.11)
RDW: 13.9 % (ref 11.5–15.5)
WBC: 6 10*3/uL (ref 4.0–10.5)
nRBC: 0 % (ref 0.0–0.2)

## 2018-12-13 LAB — LACTATE DEHYDROGENASE: LDH: 199 U/L — AB (ref 98–192)

## 2018-12-13 NOTE — Progress Notes (Signed)
Diagnosis Essential thrombocytosis ([place]) - Plan: CBC with Differential/Platelet, Comprehensive metabolic panel, Lactate dehydrogenase  JAK2 V617F mutation - Plan: CBC with Differential/Platelet, Comprehensive metabolic panel, Lactate dehydrogenase  Staging Cancer Staging No matching staging information was found for the patient.  Assessment and Plan:  1.  Jak 2+ myeloproliferative disorder: - Hydroxyurea started in December 2016, currently taking 1 tablet on Monday and 1 tablet on Thursday.  Tolerating therapy.    Labs done 12/13/2018 reviewed and shiowed WBC 6 HB 13.1 plts 398,000.  Chemistries WNL with K+ 4.2 Cr 1.19 and normal LFTs.   Pt should continue Hydrea at present dose.  She will RTC in Pt will RTC in 04/2019 for follow-up and labs.   2.  Hallucinations.  I have again discussed with her to speak to PCP regarding this.  She reports this occurred after she lost her husband 17 months ago.  Pt may need neurology or cognitive testing.    3.  Atrial Fibrillation.  Pt on Xarelto.  Follow-up with Cardiology or PCP as directed.    Current Status:  Pt seen today for follow-up to go over labs.  Pt reports occasional hallucinations which she reported in the past since losing husband.    Problem List Patient Active Problem List   Diagnosis Date Noted  . Non-rheumatic tricuspid valve insufficiency [I36.1] 07/15/2018  . Tick bite [id number] 05/10/2017  . Thoracic aortic atherosclerosis ([place]) [I70.0] 11/06/2016  . Metabolic syndrome [A54.09] 07/07/2016  . JAK2 V617F mutation [Z15.89] 01/24/2016  . Essential thrombocytosis ([place]) [D47.3] 01/24/2016  . PAF (paroxysmal atrial fibrillation) ([place]) [I48.0] 12/18/2013  . Cervical spondylosis without myelopathy [M47.812] 04/27/2013  . ABDOMINAL PAIN, UNSPECIFIED SITE [R10.9] 02/02/2011  . Palpitations [R00.2] 02/26/2010  . COLONIC POLYPS, HYPERPLASTIC [D12.6] 12/07/2007  . LIPOMA [D17.9] 12/07/2007  . OBESITY [E66.9] 12/07/2007  . Essential  hypertension [I10] 12/07/2007  . GERD [K21.9] 12/07/2007  . HIATAL HERNIA [K44.9] 12/07/2007  . DIVERTICULOSIS, COLON [K57.30] 12/07/2007  . FATTY LIVER DISEASE [K76.89] 12/07/2007  . ENDOMETRIAL POLYP [N84.0] 12/07/2007  . COMPLEX ENDOMETRIAL HYPERPLASIA WITHOUT ATYPIA [N85.01] 12/07/2007  . SOB [R06.02] 12/07/2007    Past Medical History Past Medical History:  Diagnosis Date  . A-fib ([place])   . Cataract   . Closed left arm fracture 2016  . Colon polyps   . Diverticulosis   . Endometrial hyperplasia without atypia, simple   . Endometrial polyp   . Fatty liver   . GERD (gastroesophageal reflux disease)   . Hiatal hernia   . Hyperplastic colon polyp   . Hypertension   . Lipoma   . Obesity   . Optic neuropathy of both eyes 1989, 1997  . SOB (shortness of breath)   . Thrombocytosis ([place])     Past Surgical History Past Surgical History:  Procedure Laterality Date  . breast biopsy    . CATARACT EXTRACTION    . CHOLECYSTECTOMY  03/2002  . DILATION AND CURETTAGE OF UTERUS     x 2  . EYE SURGERY    . HERNIA REPAIR    . KNEE SURGERY    . optic neuritis    . umbilical hernia repair  09/2002    Family History Family History  Problem Relation Age of Onset  . Diabetes Mother   . Diabetes Sister   . Stroke Father   . Diabetes Brother   . Heart disease Brother   . Cancer Sister        lung  . Cancer Sister  vulva  . Cancer Sister   . Dementia Sister   . Osteoporosis Sister   . Diabetes Sister   . Osteoporosis Sister   . Diabetes Sister   . Cancer Brother        bladder  . Colon cancer Neg Hx      Social History  reports that she quit smoking about 36 years ago. She has never used smokeless tobacco. She reports that she does not drink alcohol or use drugs.  Medications  Current Outpatient Medications:  .  ALPRAZolam (XANAX) 0.25 MG tablet, Take 1 tablet (0.25 mg total) by mouth daily as needed for anxiety. (Patient taking differently: Take 0.125 mg by  mouth daily as needed for anxiety. ), Disp: 30 tablet, Rfl: 3 .  Cholecalciferol (VITAMIN D3) 2000 UNITS TABS, Take 2,000 Units by mouth daily. , Disp: , Rfl:  .  glucose blood (ONE TOUCH ULTRA TEST) test strip, CHECK BLOOD SUGAR ONCE DAILY, Disp: 100 each, Rfl: 2 .  hydrochlorothiazide (HYDRODIURIL) 25 MG tablet, Take 1 tablet (25 mg total) by mouth daily., Disp: 90 tablet, Rfl: 0 .  hydroxyurea (HYDREA) 500 MG capsule, Take 1 capsule (500 mg total) by mouth daily. On Monday & Thursday only, Disp: 30 capsule, Rfl: 4 .  metoprolol succinate (TOPROL-XL) 25 MG 24 hr tablet, TAKE 1 TABLET BY MOUTH TWO  TIMES DAILY (Patient taking differently: Take 25 mg by mouth daily. ), Disp: 180 tablet, Rfl: 1 .  Multiple Vitamins-Minerals (EYE VITAMINS PO), Take 1 tablet by mouth daily., Disp: , Rfl:  .  omeprazole (PRILOSEC) 20 MG capsule, TAKE 1 CAPSULE BY MOUTH  DAILY, Disp: 90 capsule, Rfl: 1 .  Polyethyl Glycol-Propyl Glycol (SYSTANE OP), Apply 1-2 drops to eye 4 (four) times daily as needed (dry eyes)., Disp: , Rfl:  .  sertraline (ZOLOFT) 50 MG tablet, TAKE 1 AND 1/2 TABLETS BY  MOUTH ONCE DAILY (Needs to be seen), Disp: 135 tablet, Rfl: 0 .  timolol (TIMOPTIC) 0.5 % ophthalmic solution, Place 1 drop into both eyes daily. , Disp: , Rfl:  .  XARELTO 15 MG TABS tablet, TAKE 1 TABLET BY MOUTH  DAILY, Disp: 90 tablet, Rfl: 0  Allergies Codeine; Latanoprost; and Tramadol  Review of Systems Review of Systems - Oncology ROS negative other than occasional hallucinations.     Physical Exam  Vitals Wt Readings from Last 3 Encounters:  12/13/18 168 lb 4 oz (76.3 kg)  10/26/18 166 lb (75.3 kg)  09/26/18 167 lb (75.8 kg)   Temp Readings from Last 3 Encounters:  12/13/18 98.1 F (36.7 C) (Oral)  10/26/18 (!) 96.8 F (36 C) (Oral)  09/26/18 (!) 96.8 F (36 C) (Oral)   BP Readings from Last 3 Encounters:  12/13/18 (!) 134/56  10/26/18 (!) 151/71  09/28/18 (!) 143/72   Pulse Readings from Last 3  Encounters:  12/13/18 65  10/26/18 66  09/28/18 76   Constitutional: Well-developed, well-nourished, and in no distress.   HENT: Head: Normocephalic and atraumatic.  Mouth/Throat: No oropharyngeal exudate. Mucosa moist. Eyes: Pupils are equal, round, and reactive to light. Conjunctivae are normal. No scleral icterus.  Neck: Normal range of motion. Neck supple. No JVD present.  Cardiovascular: Normal rate, regular rhythm and normal heart sounds.  Exam reveals no gallop and no friction rub.   No murmur heard. Pulmonary/Chest: Effort normal and breath sounds normal. No respiratory distress. No wheezes.No rales.  Abdominal: Soft. Bowel sounds are normal. No distension. There is no tenderness. There is  no guarding.  Musculoskeletal: No edema or tenderness.  Lymphadenopathy: No cervical, axillary or supraclavicular adenopathy.  Neurological: Alert and oriented to person, place, and time. No cranial nerve deficit.  Skin: Skin is warm and dry. No rash noted. No erythema. No pallor.  Psychiatric: Affect and judgment normal.   Labs Appointment on 12/13/2018  Component Date Value Ref Range Status  . WBC 12/13/2018 6.0  4.0 - 10.5 K/uL Final  . RBC 12/13/2018 4.34  3.87 - 5.11 MIL/uL Final  . Hemoglobin 12/13/2018 13.1  12.0 - 15.0 g/dL Final  . HCT 12/13/2018 41.5  36.0 - 46.0 % Final  . MCV 12/13/2018 95.6  80.0 - 100.0 fL Final  . MCH 12/13/2018 30.2  26.0 - 34.0 pg Final  . MCHC 12/13/2018 31.6  30.0 - 36.0 g/dL Final  . RDW 12/13/2018 13.9  11.5 - 15.5 % Final  . Platelets 12/13/2018 398  150 - 400 K/uL Final  . nRBC 12/13/2018 0.0  0.0 - 0.2 % Final  . Neutrophils Relative % 12/13/2018 67  % Final  . Neutro Abs 12/13/2018 4.1  1.7 - 7.7 K/uL Final  . Lymphocytes Relative 12/13/2018 21  % Final  . Lymphs Abs 12/13/2018 1.3  0.7 - 4.0 K/uL Final  . Monocytes Relative 12/13/2018 9  % Final  . Monocytes Absolute 12/13/2018 0.5  0.1 - 1.0 K/uL Final  . Eosinophils Relative 12/13/2018 2  %  Final  . Eosinophils Absolute 12/13/2018 0.1  0.0 - 0.5 K/uL Final  . Basophils Relative 12/13/2018 1  % Final  . Basophils Absolute 12/13/2018 0.0  0.0 - 0.1 K/uL Final  . Immature Granulocytes 12/13/2018 0  % Final  . Abs Immature Granulocytes 12/13/2018 0.02  0.00 - 0.07 K/uL Final   Performed at [facility], [address] [place], [place] [address]  . Sodium 12/13/2018 143  135 - 145 mmol/L Final  . Potassium 12/13/2018 4.2  3.5 - 5.1 mmol/L Final  . Chloride 12/13/2018 107  98 - 111 mmol/L Final  . CO2 12/13/2018 28  22 - 32 mmol/L Final  . Glucose, Bld 12/13/2018 103* 70 - 99 mg/dL Final  . BUN 12/13/2018 22  8 - 23 mg/dL Final  . Creatinine, Ser 12/13/2018 1.19* 0.44 - 1.00 mg/dL Final  . Calcium 12/13/2018 9.5  8.9 - 10.3 mg/dL Final  . Total Protein 12/13/2018 6.9  6.5 - 8.1 g/dL Final  . Albumin 12/13/2018 4.2  3.5 - 5.0 g/dL Final  . AST 12/13/2018 27  15 - 41 U/L Final  . ALT 12/13/2018 18  0 - 44 U/L Final  . Alkaline Phosphatase 12/13/2018 52  38 - 126 U/L Final  . Total Bilirubin 12/13/2018 0.8  0.3 - 1.2 mg/dL Final  . GFR calc non Af Amer 12/13/2018 41* >60 mL/min Final  . GFR calc Af Amer 12/13/2018 47* >60 mL/min Final  . Anion gap 12/13/2018 8  5 - 15 Final   Performed at [facility], [address] [place], [place] [address]  . LDH 12/13/2018 199* 98 - 192 U/L Final   Performed at [facility], [address], [place], [place] [address]     Pathology Orders Placed This Encounter  Procedures  . CBC with Differential/Platelet    Standing Status:   Future    Standing Expiration Date:   12/14/2019  . Comprehensive metabolic panel    Standing Status:   Future    Standing Expiration Date:   12/14/2019  . Lactate  dehydrogenase    Standing Status:   Future    Standing Expiration Date:   12/14/2019       Zoila Shutter MD

## 2018-12-13 NOTE — Progress Notes (Signed)
Arrange for neurology visit for Kristy [name] because of recommendations from hematologist regarding patient's ongoing hallucinations.

## 2018-12-14 ENCOUNTER — Other Ambulatory Visit: Payer: Self-pay | Admitting: *Deleted

## 2018-12-14 DIAGNOSIS — R441 Visual hallucinations: Secondary | ICD-10-CM

## 2018-12-14 NOTE — Progress Notes (Signed)
Pt refuses to see neurology at this time - cancelled referral

## 2019-01-10 NOTE — Progress Notes (Signed)
HPI The patient presents for follow up of atrial fib.   She had been managed with Xarelto.  She did have some pulmonary HTN and TR on echo.  However, follow up echo in Feb of 2017 demonstrated only mod TR and no significant pulmonary HTN.  She was in atrial flutter and cardioversion was planned but she went back into NSR on her own.  She feels better.  She is not having any palpitations, presyncope or syncope.  She still has chronic dyspnea on exertion.  Interestingly she does not notice it today she is back in fibrillation.  She still going to church and to eat out with friends.   Allergies  Allergen Reactions  . Codeine Nausea Only  . Latanoprost Other (See Comments)    Red and burning  . Tramadol Nausea Only    Current Outpatient Medications  Medication Sig Dispense Refill  . ALPRAZolam (XANAX) 0.25 MG tablet Take 1 tablet (0.25 mg total) by mouth daily as needed for anxiety. (Patient taking differently: Take 0.125 mg by mouth daily as needed for anxiety. ) 30 tablet 3  . Cholecalciferol (VITAMIN D3) 2000 UNITS TABS Take 2,000 Units by mouth daily.     . hydrochlorothiazide (HYDRODIURIL) 25 MG tablet Take 1 tablet (25 mg total) by mouth daily. 90 tablet 0  . hydroxyurea (HYDREA) 500 MG capsule Take 1 capsule (500 mg total) by mouth daily. On Monday & Thursday only 30 capsule 4  . metoprolol succinate (TOPROL-XL) 25 MG 24 hr tablet Take 25 mg by mouth daily.    . Multiple Vitamins-Minerals (EYE VITAMINS PO) Take 1 tablet by mouth daily.    Marland Kitchen omeprazole (PRILOSEC) 20 MG capsule TAKE 1 CAPSULE BY MOUTH  DAILY 90 capsule 1  . Polyethyl Glycol-Propyl Glycol (SYSTANE OP) Apply 1-2 drops to eye 4 (four) times daily as needed (dry eyes).    . sertraline (ZOLOFT) 50 MG tablet TAKE 1 AND 1/2 TABLETS BY  MOUTH ONCE DAILY (Needs to be seen) 135 tablet 0  . timolol (TIMOPTIC) 0.5 % ophthalmic solution Place 1 drop into both eyes daily.     Marland Kitchen glucose blood (ONE TOUCH ULTRA TEST) test strip CHECK  BLOOD SUGAR ONCE DAILY 100 each 2  . XARELTO 15 MG TABS tablet TAKE 1 TABLET BY MOUTH  DAILY 90 tablet 0   No current facility-administered medications for this visit.     Past Medical History:  Diagnosis Date  . A-fib ([place])   . Cataract   . Closed left arm fracture 2016  . Colon polyps   . Diverticulosis   . Endometrial hyperplasia without atypia, simple   . Endometrial polyp   . Fatty liver   . GERD (gastroesophageal reflux disease)   . Hiatal hernia   . Hyperplastic colon polyp   . Hypertension   . Lipoma   . Obesity   . Optic neuropathy of both eyes 1989, 1997  . SOB (shortness of breath)   . Thrombocytosis ([place])     Past Surgical History:  Procedure Laterality Date  . breast biopsy    . CATARACT EXTRACTION    . CHOLECYSTECTOMY  03/2002  . DILATION AND CURETTAGE OF UTERUS     x 2  . EYE SURGERY    . HERNIA REPAIR    . KNEE SURGERY    . optic neuritis    . umbilical hernia repair  09/2002    ROS:  As stated in the HPI and negative for all  other systems.  PHYSICAL EXAM BP (!) 146/70   Pulse 66   Ht 5\' 2"  (1.575 m)   Wt 167 lb (75.8 kg)   BMI 30.54 kg/m   GENERAL:  Well appearing NECK:  No jugular venous distention, waveform within normal limits, carotid upstroke brisk and symmetric, no bruits, no thyromegaly LUNGS:  Clear to auscultation bilaterally CHEST:  Unremarkable HEART:  PMI not displaced or sustained,S1 and S2 within normal limits, no S3,  no clicks, no rubs, no murmurs, irregular ABD:  Flat, positive bowel sounds normal in frequency in pitch, no bruits, no rebound, no guarding, no midline pulsatile mass, no hepatomegaly, no splenomegaly EXT:  2 plus pulses throughout, no edema, no cyanosis no clubbing   EKG: Atypical atrial flutter with variable conduction with sinus arrhythmia, rate 66 left axis deviation, left anterior fascicular block, poor anterior R wave progression, no acute ST-T wave changes.  01/11/2019   ASSESSMENT AND PLAN  ATRIAL  FLUTTER:   Kristy [name] has a CHA2DS2 - VASc score of 5 with a risk of stroke of 6.7% .  She is not noticing this rhythm.  Therefore, no change in therapy is indicated.  HTN:  The blood pressure is slightly elevated.  However, it fluctuates.  I will not make any changes to her medications.   DYSPNEA:    This is baseline.  She is had an extensive work-up with no clear etiology.  No further work-up is planned.  Is very likely that this could be related to her very large hiatal hernia.

## 2019-01-11 ENCOUNTER — Encounter: Payer: Self-pay | Admitting: Cardiology

## 2019-01-11 ENCOUNTER — Ambulatory Visit: Payer: Medicare Other | Admitting: Cardiology

## 2019-01-11 VITALS — BP 146/70 | HR 66 | Ht 62.0 in | Wt 167.0 lb

## 2019-01-11 DIAGNOSIS — I10 Essential (primary) hypertension: Secondary | ICD-10-CM

## 2019-01-11 DIAGNOSIS — R0600 Dyspnea, unspecified: Secondary | ICD-10-CM | POA: Diagnosis not present

## 2019-01-11 DIAGNOSIS — I484 Atypical atrial flutter: Secondary | ICD-10-CM | POA: Diagnosis not present

## 2019-01-11 NOTE — Patient Instructions (Signed)
Medication Instructions:  The current medical regimen is effective;  continue present plan and medications.  If you need a refill on your cardiac medications before your next appointment, please call your pharmacy.   Follow-Up: Follow up in 1 year with Dr. Hochrein.  You will receive a letter in the mail 2 months before you are due.  Please call us when you receive this letter to schedule your follow up appointment.  Thank you for choosing [facility]!!     

## 2019-01-20 ENCOUNTER — Telehealth: Payer: Self-pay

## 2019-01-20 NOTE — Telephone Encounter (Signed)
Pt states she is short of breath and has been for about 4 months; Was recently seen by cardiologist; Please advise

## 2019-01-20 NOTE — Telephone Encounter (Signed)
NA , NO VM -2/14-jhb

## 2019-01-22 ENCOUNTER — Other Ambulatory Visit: Payer: Self-pay | Admitting: Family Medicine

## 2019-02-08 ENCOUNTER — Ambulatory Visit (INDEPENDENT_AMBULATORY_CARE_PROVIDER_SITE_OTHER): Payer: Medicare Other | Admitting: Family Medicine

## 2019-02-08 ENCOUNTER — Encounter: Payer: Self-pay | Admitting: Family Medicine

## 2019-02-08 ENCOUNTER — Ambulatory Visit (INDEPENDENT_AMBULATORY_CARE_PROVIDER_SITE_OTHER): Payer: Medicare Other

## 2019-02-08 VITALS — BP 135/67 | HR 59 | Temp 97.1°F | Ht 62.0 in | Wt 167.0 lb

## 2019-02-08 DIAGNOSIS — I10 Essential (primary) hypertension: Secondary | ICD-10-CM

## 2019-02-08 DIAGNOSIS — K219 Gastro-esophageal reflux disease without esophagitis: Secondary | ICD-10-CM

## 2019-02-08 DIAGNOSIS — I700 Atherosclerosis of aorta: Secondary | ICD-10-CM

## 2019-02-08 DIAGNOSIS — E559 Vitamin D deficiency, unspecified: Secondary | ICD-10-CM

## 2019-02-08 DIAGNOSIS — N183 Chronic kidney disease, stage 3 (moderate): Secondary | ICD-10-CM | POA: Diagnosis not present

## 2019-02-08 DIAGNOSIS — I480 Paroxysmal atrial fibrillation: Secondary | ICD-10-CM | POA: Diagnosis not present

## 2019-02-08 DIAGNOSIS — R05 Cough: Secondary | ICD-10-CM | POA: Diagnosis not present

## 2019-02-08 DIAGNOSIS — R0602 Shortness of breath: Secondary | ICD-10-CM

## 2019-02-08 DIAGNOSIS — N1832 Chronic kidney disease, stage 3b: Secondary | ICD-10-CM

## 2019-02-08 DIAGNOSIS — R059 Cough, unspecified: Secondary | ICD-10-CM

## 2019-02-08 MED ORDER — METOPROLOL SUCCINATE ER 25 MG PO TB24: 25.0000 mg | ORAL_TABLET | Freq: Every day | ORAL | 3 refills | Status: DC

## 2019-02-08 MED ORDER — ALPRAZOLAM 0.25 MG PO TABS: 0.2500 mg | ORAL_TABLET | Freq: Every day | ORAL | 0 refills | Status: DC | PRN

## 2019-02-08 MED ORDER — HYDROCHLOROTHIAZIDE 25 MG PO TABS: 25.0000 mg | ORAL_TABLET | Freq: Every day | ORAL | 3 refills | Status: DC

## 2019-02-08 MED ORDER — RIVAROXABAN 15 MG PO TABS: 15.0000 mg | ORAL_TABLET | Freq: Every day | ORAL | 3 refills | Status: DC

## 2019-02-08 MED ORDER — OMEPRAZOLE 20 MG PO CPDR: DELAYED_RELEASE_CAPSULE | ORAL | 3 refills | Status: DC

## 2019-02-08 MED ORDER — SERTRALINE HCL 50 MG PO TABS: ORAL_TABLET | ORAL | 3 refills | Status: DC

## 2019-02-08 NOTE — Addendum Note (Signed)
Addended by: Zannie Cove on: 02/08/2019 12:13 PM   Modules accepted: Orders

## 2019-02-08 NOTE — Patient Instructions (Addendum)
Medicare Annual Wellness Visit  [facility] and the medical providers at [facility] strive to bring you the best medical care.  In doing so we not only want to address your current medical conditions and concerns but also to detect new conditions early and prevent illness, disease and health-related problems.    Medicare offers a yearly Wellness Visit which allows our clinical staff to assess your need for preventative services including immunizations, lifestyle education, counseling to decrease risk of preventable diseases and screening for fall risk and other medical concerns.    This visit is provided free of charge (no copay) for all Medicare recipients. The clinical pharmacists at [facility] have begun to conduct these Wellness Visits which will also include a thorough review of all your medications.    As you primary medical provider recommend that you make an appointment for your Annual Wellness Visit if you have not done so already this year.  You may set up this appointment before you leave today or you may call back ([phone]) and schedule an appointment.  Please make sure when you call that you mention that you are scheduling your Annual Wellness Visit with the clinical pharmacist so that the appointment may be made for the proper length of time.     Continue current medications. Continue good therapeutic lifestyle changes which include good diet and exercise. Fall precautions discussed with patient. If an FOBT was given today- please return it to our front desk. If you are over 7 years old - you may need Prevnar 41 or the adult Pneumonia vaccine.  **Flu shots are available--- please call and schedule a FLU-CLINIC appointment**  After your visit with [place] today you will receive a survey in the mail or online from [employer] regarding your care with [place]. Please take a moment to fill this out. Your feedback is very  important to [place] as you can help [place] better understand your patient needs as well as improve your experience and satisfaction. WE CARE ABOUT YOU!!!   We will call with lab work results as soon as these results become available We will call with chest x-ray results also when the is reported We will arrange for you to have an appointment with the pulmonologist to further evaluate your shortness of breath Please discuss the hallucination issue again with the hematologist Stay as active as physically possible always being careful not to put yourself at risk for falling Drink plenty of water and stay well-hydrated Practice good hand and pulmonary hygiene.

## 2019-02-08 NOTE — Progress Notes (Signed)
Subjective:    Patient ID: Kristy [name], female    DOB: Sep 19, 1928, 85 y.o.   MRN: [id number]  HPI Pt here for follow up and management of chronic medical problems which includes hypertension and a fib. She is taking medication regularly.  Patient today is complaining with some increased shortness of breath cough and weakness.  She is requesting refill on her Xanax as well as several other prescriptions.  She will get a chest x-ray today.  Her pulse ox was done and was 95%.  She will get lab work today.  Her weight today compared to previously is unchanged.  The patient is followed by Dr. Ishmael Holter for her thrombocytosis.  She also has paroxysmal atrial fibrillation.  She last saw Dr. Percival Spanish just recently on February 5.  He is aware of her dyspnea and said this was a baseline for her as she has had an extensive work-up with no clear etiology.  He thinks that some of this is related to her very large hiatal hernia.  And alert today and really believes that she is having hallucinations from the [facility] that she is taking for her platelets.  She says it is only momentarily after she awakes from a nap or gets up and then it goes away.  She denies any chest pain pressure tightness and no more shortness of breath than usual.  As long as she is taking her proton pump inhibitor she is not having any problems with reflux despite her large hiatal hernia.  She denies any blood in the stool or black tarry bowel movements.  She is passing her water frequently and has urgency and does wear a pad.  Her vital signs are good today.  Because she worries a lot we will arrange for her to see someone like a pulmonologist regarding her ongoing problems with shortness of breath.  She understands that this may very well be due to her large hiatal hernia.    Patient Active Problem List   Diagnosis Date Noted  . Non-rheumatic tricuspid valve insufficiency 07/15/2018  . Tick bite 05/10/2017  . Thoracic aortic atherosclerosis ([place])  11/06/2016  . Metabolic syndrome 34/19/3790  . 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 Essential thrombocytosis ([place]) 01/24/2016  . PAF (paroxysmal atrial fibrillation) ([place]) 12/18/2013  . Cervical spondylosis without myelopathy 04/27/2013  . ABDOMINAL PAIN, UNSPECIFIED SITE 02/02/2011  . Palpitations 02/26/2010  . COLONIC POLYPS, HYPERPLASTIC 12/07/2007  . LIPOMA 12/07/2007  . OBESITY 12/07/2007  . Essential hypertension 12/07/2007  . GERD 12/07/2007  . HIATAL HERNIA 12/07/2007  . DIVERTICULOSIS, COLON 12/07/2007  . FATTY LIVER DISEASE 12/07/2007  . ENDOMETRIAL POLYP 12/07/2007  . COMPLEX ENDOMETRIAL HYPERPLASIA WITHOUT ATYPIA 12/07/2007  . SOB 12/07/2007   Outpatient Encounter Medications as of 02/08/2019  Medication Sig  . ALPRAZolam (XANAX) 0.25 MG tablet Take 1 tablet (0.25 mg total) by mouth daily as needed for anxiety. (Patient taking differently: Take 0.125 mg by mouth daily as needed for anxiety. )  . Cholecalciferol (VITAMIN D3) 2000 UNITS TABS Take 2,000 Units by mouth daily.   Marland Kitchen glucose blood (ONE TOUCH ULTRA TEST) test strip CHECK BLOOD SUGAR ONCE DAILY  . hydrochlorothiazide (HYDRODIURIL) 25 MG tablet Take 1 tablet (25 mg total) by mouth daily.  . hydroxyurea (HYDREA) 500 MG capsule Take 1 capsule (500 mg total) by mouth daily. On Monday & Thursday only  . metoprolol succinate (TOPROL-XL) 25 MG 24 hr tablet Take 25 mg by mouth daily.  . Multiple Vitamins-Minerals (EYE VITAMINS  PO) Take 1 tablet by mouth daily.  Marland Kitchen omeprazole (PRILOSEC) 20 MG capsule TAKE 1 CAPSULE BY MOUTH  DAILY  . Polyethyl Glycol-Propyl Glycol (SYSTANE OP) Apply 1-2 drops to eye 4 (four) times daily as needed (dry eyes).  . sertraline (ZOLOFT) 50 MG tablet TAKE 1 AND 1/2 TABLETS BY  MOUTH ONCE DAILY  . timolol (TIMOPTIC) 0.5 % ophthalmic solution Place 1 drop into both eyes daily.   Alveda Reasons 15 MG TABS tablet TAKE 1 TABLET BY MOUTH  DAILY   No facility-administered encounter medications on file as of  02/08/2019.      Review of Systems  Constitutional: Negative.   HENT: Negative.   Eyes: Negative.   Respiratory: Positive for cough and shortness of breath.   Cardiovascular: Negative.   Gastrointestinal: Negative.   Endocrine: Negative.   Genitourinary: Negative.   Musculoskeletal: Negative.   Skin: Negative.   Allergic/Immunologic: Negative.   Neurological: Positive for weakness and numbness.  Hematological: Negative.   Psychiatric/Behavioral: Negative.        Objective:   Physical Exam Vitals signs and nursing note reviewed.  Constitutional:      General: She is not in acute distress.    Appearance: Normal appearance. She is well-developed. She is obese.     Comments: Patient is pleasant and alert and emotional at times and looks great for her age of 85 years.  HENT:     Head: Normocephalic and atraumatic.     Right Ear: External ear normal. There is impacted cerumen.     Left Ear: External ear normal. There is impacted cerumen.     Ears:     Comments: Small ear canals bilaterally with some impacted cerumen bilaterally.    Nose: Nose normal. No congestion.     Mouth/Throat:     Mouth: Mucous membranes are moist.     Pharynx: Oropharynx is clear. No oropharyngeal exudate.  Eyes:     General: No scleral icterus.       Right eye: No discharge.        Left eye: No discharge.     Extraocular Movements: Extraocular movements intact.     Conjunctiva/sclera: Conjunctivae normal.     Pupils: Pupils are equal, round, and reactive to light.  Neck:     Musculoskeletal: Normal range of motion and neck supple.     Thyroid: No thyromegaly.     Vascular: No carotid bruit or JVD.     Comments: No bruits thyromegaly or anterior cervical adenopathy Cardiovascular:     Rate and Rhythm: Normal rate and regular rhythm.     Pulses: Normal pulses.     Heart sounds: Normal heart sounds. No murmur.     Comments: The heart is slightly irregular at 72/min slight bilateral ankle edema but  good pedal pulses and no pedal edema.  Patient is wearing support stockings. Pulmonary:     Effort: Pulmonary effort is normal. No respiratory distress.     Breath sounds: Normal breath sounds. No wheezing or rales.     Comments: Clear anteriorly and posteriorly with no wheezes rales or increased congestion. Abdominal:     General: Bowel sounds are normal.     Palpations: Abdomen is soft. There is no mass.     Tenderness: There is no abdominal tenderness.     Comments: No liver or spleen enlargement masses bruits epigastric or suprapubic tenderness.  Musculoskeletal: Normal range of motion.        General: Deformity present. No tenderness.  Right lower leg: No edema.     Left lower leg: No edema.     Comments: Kyphotic posture  Lymphadenopathy:     Cervical: No cervical adenopathy.  Skin:    General: Skin is warm and dry.     Findings: No rash.  Neurological:     General: No focal deficit present.     Mental Status: She is alert and oriented to person, place, and time. Mental status is at baseline.     Cranial Nerves: No cranial nerve deficit.     Gait: Gait normal.     Deep Tendon Reflexes: Reflexes are normal and symmetric. Reflexes normal.  Psychiatric:        Mood and Affect: Mood normal.        Behavior: Behavior normal.        Thought Content: Thought content normal.        Judgment: Judgment normal.     Comments: Mood affect and behavior are all normal for this patient     BP 135/67 (BP Location: Left Arm)   Pulse (!) 59   Temp (!) 97.1 F (36.2 C) (Oral)   Ht 5' 2"  (1.575 m)   Wt 167 lb (75.8 kg)   BMI 30.54 kg/m        Assessment & Plan:  1. Essential hypertension -Blood pressure good today and she will continue with current treatment - CBC with Differential/Platelet - Hepatic function panel - DG Chest 2 View; Future  2. Paroxysmal atrial fibrillation (HCC) -The heart is only slightly irregular at 72/min - CBC with Differential/Platelet  3.  Thoracic aortic atherosclerosis ([place]) -Continue with physical activity and watching diet closely for cholesterol - CBC with Differential/Platelet - Lipid panel - DG Chest 2 View; Future  4. Gastroesophageal reflux disease, esophagitis presence not specified -Continue with omeprazole as she is doing well with this even though she has a large hiatal hernia. - CBC with Differential/Platelet  5. Chronic kidney disease (CKD) stage G3b/A2, moderately decreased glomerular filtration rate (GFR) between 30-44 mL/min/1.73 square meter and albuminuria creatinine ratio between 30-299 mg/g (HCC) -Continue to avoid all NSAIDs and only take Tylenol if needed for aches pains and fever - CBC with Differential/Platelet - BMP8+EGFR  6. Vitamin D deficiency -Continue with vitamin D replacement - CBC with Differential/Platelet - VITAMIN D 25 Hydroxy (Vit-D Deficiency, Fractures)  7. SOB (shortness of breath) -Referral to pulmonology - CBC with Differential/Platelet - DG Chest 2 View; Future  8. Cough -Referral to pulmonology - CBC with Differential/Platelet - DG Chest 2 View; Future  Meds ordered this encounter  Medications  . omeprazole (PRILOSEC) 20 MG capsule    Sig: TAKE 1 CAPSULE BY MOUTH  DAILY    Dispense:  90 capsule    Refill:  3  . sertraline (ZOLOFT) 50 MG tablet    Sig: TAKE 1 AND 1/2 TABLETS BY  MOUTH ONCE DAILY    Dispense:  135 tablet    Refill:  3  . Rivaroxaban (XARELTO) 15 MG TABS tablet    Sig: Take 1 tablet (15 mg total) by mouth daily.    Dispense:  90 tablet    Refill:  3  . hydrochlorothiazide (HYDRODIURIL) 25 MG tablet    Sig: Take 1 tablet (25 mg total) by mouth daily.    Dispense:  90 tablet    Refill:  3  . metoprolol succinate (TOPROL-XL) 25 MG 24 hr tablet    Sig: Take 1 tablet (25 mg total) by  mouth daily.    Dispense:  90 tablet    Refill:  3  . ALPRAZolam (XANAX) 0.25 MG tablet    Sig: Take 1 tablet (0.25 mg total) by mouth daily as needed for anxiety.      Dispense:  90 tablet    Refill:  0   Patient Instructions                       Medicare Annual Wellness Visit  [facility] and the medical providers at [facility] strive to bring you the best medical care.  In doing so we not only want to address your current medical conditions and concerns but also to detect new conditions early and prevent illness, disease and health-related problems.    Medicare offers a yearly Wellness Visit which allows our clinical staff to assess your need for preventative services including immunizations, lifestyle education, counseling to decrease risk of preventable diseases and screening for fall risk and other medical concerns.    This visit is provided free of charge (no copay) for all Medicare recipients. The clinical pharmacists at [facility] have begun to conduct these Wellness Visits which will also include a thorough review of all your medications.    As you primary medical provider recommend that you make an appointment for your Annual Wellness Visit if you have not done so already this year.  You may set up this appointment before you leave today or you may call back ([phone]) and schedule an appointment.  Please make sure when you call that you mention that you are scheduling your Annual Wellness Visit with the clinical pharmacist so that the appointment may be made for the proper length of time.     Continue current medications. Continue good therapeutic lifestyle changes which include good diet and exercise. Fall precautions discussed with patient. If an FOBT was given today- please return it to our front desk. If you are over 46 years old - you may need Prevnar 23 or the adult Pneumonia vaccine.  **Flu shots are available--- please call and schedule a FLU-CLINIC appointment**  After your visit with [place] today you will receive a survey in the mail or online from [employer] regarding your care with [place].  Please take a moment to fill this out. Your feedback is very important to [place] as you can help [place] better understand your patient needs as well as improve your experience and satisfaction. WE CARE ABOUT YOU!!!   We will call with lab work results as soon as these results become available We will call with chest x-ray results also when the is reported We will arrange for you to have an appointment with the pulmonologist to further evaluate your shortness of breath Please discuss the hallucination issue again with the hematologist Stay as active as physically possible always being careful not to put yourself at risk for falling Drink plenty of water and stay well-hydrated Practice good hand and pulmonary hygiene.  Arrie Senate MD

## 2019-02-09 LAB — CBC WITH DIFFERENTIAL/PLATELET
Basophils Absolute: 0.1 10*3/uL (ref 0.0–0.2)
Basos: 1 %
EOS (ABSOLUTE): 0.1 10*3/uL (ref 0.0–0.4)
EOS: 1 %
Hematocrit: 40.1 % (ref 34.0–46.6)
Hemoglobin: 13.5 g/dL (ref 11.1–15.9)
Immature Grans (Abs): 0 10*3/uL (ref 0.0–0.1)
Immature Granulocytes: 1 %
LYMPHS ABS: 1.3 10*3/uL (ref 0.7–3.1)
Lymphs: 20 %
MCH: 30.4 pg (ref 26.6–33.0)
MCHC: 33.7 g/dL (ref 31.5–35.7)
MCV: 90 fL (ref 79–97)
Monocytes Absolute: 0.6 10*3/uL (ref 0.1–0.9)
Monocytes: 9 %
Neutrophils Absolute: 4.4 10*3/uL (ref 1.4–7.0)
Neutrophils: 68 %
Platelets: 448 10*3/uL (ref 150–450)
RBC: 4.44 x10E6/uL (ref 3.77–5.28)
RDW: 13.8 % (ref 11.7–15.4)
WBC: 6.5 10*3/uL (ref 3.4–10.8)

## 2019-02-09 LAB — BMP8+EGFR
BUN/Creatinine Ratio: 16 (ref 12–28)
BUN: 19 mg/dL (ref 8–27)
CO2: 25 mmol/L (ref 20–29)
Calcium: 9.9 mg/dL (ref 8.7–10.3)
Chloride: 103 mmol/L (ref 96–106)
Creatinine, Ser: 1.18 mg/dL — ABNORMAL HIGH (ref 0.57–1.00)
GFR calc Af Amer: 48 mL/min/{1.73_m2} — ABNORMAL LOW (ref >59)
GFR calc non Af Amer: 41 mL/min/{1.73_m2} — ABNORMAL LOW (ref >59)
Glucose: 96 mg/dL (ref 65–99)
Potassium: 4.7 mmol/L (ref 3.5–5.2)
Sodium: 141 mmol/L (ref 134–144)

## 2019-02-09 LAB — LIPID PANEL
Chol/HDL Ratio: 3.1 ratio (ref 0.0–4.4)
Cholesterol, Total: 166 mg/dL (ref 100–199)
HDL: 53 mg/dL (ref >39)
LDL Calculated: 90 mg/dL (ref 0–99)
Triglycerides: 113 mg/dL (ref 0–149)
VLDL Cholesterol Cal: 23 mg/dL (ref 5–40)

## 2019-02-09 LAB — HEPATIC FUNCTION PANEL
ALT: 16 IU/L (ref 0–32)
AST: 31 IU/L (ref 0–40)
Albumin: 4.5 g/dL (ref 3.6–4.6)
Alkaline Phosphatase: 70 IU/L (ref 39–117)
Bilirubin Total: 0.5 mg/dL (ref 0.0–1.2)
Bilirubin, Direct: 0.17 mg/dL (ref 0.00–0.40)
Total Protein: 6.4 g/dL (ref 6.0–8.5)

## 2019-02-09 LAB — VITAMIN D 25 HYDROXY (VIT D DEFICIENCY, FRACTURES): Vit D, 25-Hydroxy: 62 ng/mL (ref 30.0–100.0)

## 2019-02-27 ENCOUNTER — Encounter: Payer: Self-pay | Admitting: *Deleted

## 2019-03-27 ENCOUNTER — Other Ambulatory Visit (HOSPITAL_COMMUNITY): Payer: Self-pay | Admitting: Hematology

## 2019-03-27 DIAGNOSIS — D473 Essential (hemorrhagic) thrombocythemia: Secondary | ICD-10-CM

## 2019-04-13 ENCOUNTER — Other Ambulatory Visit (HOSPITAL_COMMUNITY): Payer: Medicare Other

## 2019-04-13 ENCOUNTER — Ambulatory Visit (HOSPITAL_COMMUNITY): Payer: Medicare Other | Admitting: Hematology

## 2019-05-15 ENCOUNTER — Other Ambulatory Visit (HOSPITAL_COMMUNITY): Payer: Self-pay | Admitting: *Deleted

## 2019-05-15 ENCOUNTER — Other Ambulatory Visit: Payer: Self-pay

## 2019-05-15 DIAGNOSIS — Z1589 Genetic susceptibility to other disease: Secondary | ICD-10-CM

## 2019-05-15 DIAGNOSIS — D473 Essential (hemorrhagic) thrombocythemia: Secondary | ICD-10-CM

## 2019-05-16 ENCOUNTER — Inpatient Hospital Stay (HOSPITAL_COMMUNITY): Payer: Medicare Other | Attending: Hematology

## 2019-05-16 ENCOUNTER — Other Ambulatory Visit: Payer: Self-pay

## 2019-05-16 ENCOUNTER — Inpatient Hospital Stay (HOSPITAL_BASED_OUTPATIENT_CLINIC_OR_DEPARTMENT_OTHER): Payer: Medicare Other | Admitting: Nurse Practitioner

## 2019-05-16 DIAGNOSIS — R443 Hallucinations, unspecified: Secondary | ICD-10-CM | POA: Diagnosis not present

## 2019-05-16 DIAGNOSIS — Z87891 Personal history of nicotine dependence: Secondary | ICD-10-CM | POA: Diagnosis not present

## 2019-05-16 DIAGNOSIS — I4891 Unspecified atrial fibrillation: Secondary | ICD-10-CM | POA: Insufficient documentation

## 2019-05-16 DIAGNOSIS — Z79899 Other long term (current) drug therapy: Secondary | ICD-10-CM | POA: Diagnosis not present

## 2019-05-16 DIAGNOSIS — D473 Essential (hemorrhagic) thrombocythemia: Secondary | ICD-10-CM

## 2019-05-16 DIAGNOSIS — Z7901 Long term (current) use of anticoagulants: Secondary | ICD-10-CM

## 2019-05-16 DIAGNOSIS — Z1589 Genetic susceptibility to other disease: Secondary | ICD-10-CM

## 2019-05-16 DIAGNOSIS — C946 Myelodysplastic disease, not classified: Secondary | ICD-10-CM | POA: Insufficient documentation

## 2019-05-16 LAB — COMPREHENSIVE METABOLIC PANEL
ALT: 13 U/L (ref 0–44)
AST: 23 U/L (ref 15–41)
Albumin: 4.4 g/dL (ref 3.5–5.0)
Alkaline Phosphatase: 48 U/L (ref 38–126)
Anion gap: 10 (ref 5–15)
BUN: 23 mg/dL (ref 8–23)
CO2: 26 mmol/L (ref 22–32)
Calcium: 9.5 mg/dL (ref 8.9–10.3)
Chloride: 105 mmol/L (ref 98–111)
Creatinine, Ser: 1.23 mg/dL — ABNORMAL HIGH (ref 0.44–1.00)
GFR calc Af Amer: 45 mL/min — ABNORMAL LOW (ref >60)
GFR calc non Af Amer: 39 mL/min — ABNORMAL LOW (ref >60)
Glucose, Bld: 106 mg/dL — ABNORMAL HIGH (ref 70–99)
Potassium: 4.1 mmol/L (ref 3.5–5.1)
Sodium: 141 mmol/L (ref 135–145)
Total Bilirubin: 0.7 mg/dL (ref 0.3–1.2)
Total Protein: 6.9 g/dL (ref 6.5–8.1)

## 2019-05-16 LAB — CBC WITH DIFFERENTIAL/PLATELET
Abs Immature Granulocytes: 0.01 10*3/uL (ref 0.00–0.07)
Basophils Absolute: 0.1 10*3/uL (ref 0.0–0.1)
Basophils Relative: 1 %
Eosinophils Absolute: 0.1 10*3/uL (ref 0.0–0.5)
Eosinophils Relative: 1 %
HCT: 41.9 % (ref 36.0–46.0)
Hemoglobin: 13.5 g/dL (ref 12.0–15.0)
Immature Granulocytes: 0 %
Lymphocytes Relative: 22 %
Lymphs Abs: 1.4 10*3/uL (ref 0.7–4.0)
MCH: 30.5 pg (ref 26.0–34.0)
MCHC: 32.2 g/dL (ref 30.0–36.0)
MCV: 94.8 fL (ref 80.0–100.0)
Monocytes Absolute: 0.7 10*3/uL (ref 0.1–1.0)
Monocytes Relative: 10 %
Neutro Abs: 4.3 10*3/uL (ref 1.7–7.7)
Neutrophils Relative %: 66 %
Platelets: 451 10*3/uL — ABNORMAL HIGH (ref 150–400)
RBC: 4.42 MIL/uL (ref 3.87–5.11)
RDW: 14 % (ref 11.5–15.5)
WBC: 6.5 10*3/uL (ref 4.0–10.5)
nRBC: 0 % (ref 0.0–0.2)

## 2019-05-16 LAB — LACTATE DEHYDROGENASE: LDH: 180 U/L (ref 98–192)

## 2019-05-16 MED ORDER — HYDROXYUREA 500 MG PO CAPS: ORAL_CAPSULE | ORAL | 3 refills | Status: DC

## 2019-05-16 NOTE — Patient Instructions (Signed)
[facility] at [facility] Discharge Instructions  Follow up in 6 months with labs    Thank you for choosing [facility] at [facility] to provide your oncology and hematology care.  To afford each patient quality time with our provider, please arrive at least 15 minutes before your scheduled appointment time.   If you have a lab appointment with the Cancer Center please come in thru the  Main Entrance and check in at the main information desk  You need to re-schedule your appointment should you arrive 10 or more minutes late.  We strive to give you quality time with our providers, and arriving late affects you and other patients whose appointments are after yours.  Also, if you no show three or more times for appointments you may be dismissed from the clinic at the providers discretion.     Again, thank you for choosing [facility].  Our hope is that these requests will decrease the amount of time that you wait before being seen by our physicians.       _____________________________________________________________  Should you have questions after your visit to  Cancer Center, please contact our office at [phone] between the hours of 8:00 a.m. and 4:30 p.m.  Voicemails left after 4:00 p.m. will not be returned until the following business day.  For prescription refill requests, have your pharmacy contact our office and allow 72 hours.    Cancer Center Support Programs:   > Cancer Support Group  2nd Tuesday of the month 1pm-2pm, Journey Room    

## 2019-05-16 NOTE — Progress Notes (Signed)
[address], [place] [address]   CLINIC:  Medical Oncology/Hematology  PCP:  Chipper Herb, [place] [address] [phone]   REASON FOR VISIT: Follow-up for Jak 2+ myeloproliferative disorder  CURRENT THERAPY: Hydrea   INTERVAL HISTORY:  Ms. Noa 85 y.o. female returns for routine follow-up Jak 2+ myeloproliferative disorder.  She reports she is still having hallucinations since her husband.  However they have improved.  She also reports she has been more fatigued since she was diagnosed with [facility] spotted fever.  Otherwise she is taking her Hydrea as prescribed with no problems. Denies any nausea, vomiting, or diarrhea. Denies any new pains. Had not noticed any recent bleeding such as epistaxis, hematuria or hematochezia. Denies recent chest pain on exertion, shortness of breath on minimal exertion, pre-syncopal episodes, or palpitations. Denies any numbness or tingling in hands or feet. Denies any recent fevers, infections, or recent hospitalizations. Patient reports appetite at 100% and energy level at 0%.  She is eating well and maintaining her weight at this time.     REVIEW OF SYSTEMS:  Review of Systems  Constitutional: Positive for fatigue.  Respiratory: Positive for shortness of breath.   Cardiovascular: Positive for leg swelling.  Neurological: Positive for dizziness.  All other systems reviewed and are negative.    PAST MEDICAL/SURGICAL HISTORY:  Past Medical History:  Diagnosis Date  . A-fib ([place])   . Cataract   . Closed left arm fracture 2016  . Colon polyps   . Diverticulosis   . Endometrial hyperplasia without atypia, simple   . Endometrial polyp   . Fatty liver   . GERD (gastroesophageal reflux disease)   . Hiatal hernia   . Hyperplastic colon polyp   . Hypertension   . Lipoma   . Obesity   . Optic neuropathy of both eyes 1989, 1997  . SOB (shortness of breath)   . Thrombocytosis  ([place])    Past Surgical History:  Procedure Laterality Date  . breast biopsy    . CATARACT EXTRACTION    . CHOLECYSTECTOMY  03/2002  . DILATION AND CURETTAGE OF UTERUS     x 2  . EYE SURGERY    . HERNIA REPAIR    . KNEE SURGERY    . optic neuritis    . umbilical hernia repair  09/2002     SOCIAL HISTORY:  Social History   Socioeconomic History  . Marital status: Widowed    Spouse name: Not on file  . Number of children: Not on file  . Years of education: Not on file  . Highest education level: Not on file  Occupational History  . 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 Financial resource strain: Not on file  . Food insecurity:    Worry: Not on file    Inability: Not on file  . Transportation needs:    Medical: Not on file    Non-medical: Not on file  Tobacco Use  . Smoking status: Former Smoker    Last attempt to quit: 12/07/1982    Years since quitting: 36.4  . Smokeless tobacco: Never Used  Substance and Sexual Activity  . Alcohol use: No  . Drug use: No  . Sexual activity: Never  Lifestyle  . Physical activity:    Days per week: Not on file    Minutes per session: Not on  file  . Stress: Not on file  Relationships  . Social connections:    Talks on phone: Not on file    Gets together: Not on file    Attends religious service: Not on file    Active member of club or organization: Not on file    Attends meetings of clubs or organizations: Not on file    Relationship status: Not on file  . Intimate partner violence:    Fear of current or ex partner: Not on file    Emotionally abused: Not on file    Physically abused: Not on file    Forced sexual activity: Not on file  Other Topics Concern  . Not on file  Social History Narrative  . Not on file    FAMILY HISTORY:  Family History  Problem Relation Age of Onset  . Diabetes Mother   . Diabetes Sister   . Stroke Father   . Diabetes  Brother   . Heart disease Brother   . Cancer Sister        lung  . Cancer Sister        vulva  . Cancer Sister   . Dementia Sister   . Osteoporosis Sister   . Diabetes Sister   . Osteoporosis Sister   . Diabetes Sister   . Cancer Brother        bladder  . Colon cancer Neg Hx     CURRENT MEDICATIONS:  Outpatient Encounter Medications as of 05/16/2019  Medication Sig  . ALPRAZolam (XANAX) 0.25 MG tablet Take 1 tablet (0.25 mg total) by mouth daily as needed for anxiety.  . Cholecalciferol (VITAMIN D3) 2000 UNITS TABS Take 2,000 Units by mouth daily.   Marland Kitchen glucose blood (ONE TOUCH ULTRA TEST) test strip CHECK BLOOD SUGAR ONCE DAILY  . hydrochlorothiazide (HYDRODIURIL) 25 MG tablet Take 1 tablet (25 mg total) by mouth daily.  . hydroxyurea (HYDREA) 500 MG capsule TAKE 1 CAPSULE BY MOUTH  ONCE A DAY ON MONDAYS AND  THURSDAYS ONLY  . metoprolol succinate (TOPROL-XL) 25 MG 24 hr tablet Take 1 tablet (25 mg total) by mouth daily.  . Multiple Vitamins-Minerals (EYE VITAMINS PO) Take 1 tablet by mouth daily.  Marland Kitchen omeprazole (PRILOSEC) 20 MG capsule TAKE 1 CAPSULE BY MOUTH  DAILY  . Polyethyl Glycol-Propyl Glycol (SYSTANE OP) Apply 1-2 drops to eye 4 (four) times daily as needed (dry eyes).  . Rivaroxaban (XARELTO) 15 MG TABS tablet Take 1 tablet (15 mg total) by mouth daily.  . sertraline (ZOLOFT) 50 MG tablet TAKE 1 AND 1/2 TABLETS BY  MOUTH ONCE DAILY  . timolol (TIMOPTIC) 0.5 % ophthalmic solution Place 1 drop into both eyes daily.    No facility-administered encounter medications on file as of 05/16/2019.     ALLERGIES:  Allergies  Allergen Reactions  . Codeine Nausea Only  . Latanoprost Other (See Comments)    Red and burning  . Tramadol Nausea Only     PHYSICAL EXAM:  ECOG Performance status: 1  Vitals:   05/16/19 1015  BP: (!) 153/69  Pulse: 76  Resp: 18  Temp: 98.7 F (37.1 C)  SpO2: 96%   Filed Weights   05/16/19 1015  Weight: 166 lb 9.6 oz (75.6 kg)    Physical  Exam Constitutional:      Appearance: Normal appearance. She is normal weight.  Cardiovascular:     Rate and Rhythm: Normal rate. Rhythm irregular.     Heart sounds: Normal heart sounds.  Comments: A-fib Pulmonary:     Effort: Pulmonary effort is normal.     Breath sounds: Normal breath sounds.  Abdominal:     General: Bowel sounds are normal.     Palpations: Abdomen is soft.  Musculoskeletal: Normal range of motion.  Skin:    General: Skin is warm and dry.  Neurological:     Mental Status: She is alert and oriented to person, place, and time. Mental status is at baseline.  Psychiatric:        Mood and Affect: Mood normal.        Behavior: Behavior normal.        Thought Content: Thought content normal.        Judgment: Judgment normal.      LABORATORY DATA:  I have reviewed the labs as listed.  CBC    Component Value Date/Time   WBC 6.5 05/16/2019 0953   RBC 4.42 05/16/2019 0953   HGB 13.5 05/16/2019 0953   HGB 13.5 02/08/2019 1107   HCT 41.9 05/16/2019 0953   HCT 40.1 02/08/2019 1107   PLT 451 (H) 05/16/2019 0953   PLT 448 02/08/2019 1107   MCV 94.8 05/16/2019 0953   MCV 90 02/08/2019 1107   MCH 30.5 05/16/2019 0953   MCHC 32.2 05/16/2019 0953   RDW 14.0 05/16/2019 0953   RDW 13.8 02/08/2019 1107   LYMPHSABS 1.4 05/16/2019 0953   LYMPHSABS 1.3 02/08/2019 1107   MONOABS 0.7 05/16/2019 0953   EOSABS 0.1 05/16/2019 0953   EOSABS 0.1 02/08/2019 1107   BASOSABS 0.1 05/16/2019 0953   BASOSABS 0.1 02/08/2019 1107   CMP Latest Ref Rng & Units 05/16/2019 02/08/2019 12/13/2018  Glucose 70 - 99 mg/dL 106(H) 96 103(H)  BUN 8 - 23 mg/dL 23 19 22   Creatinine 0.44 - 1.00 mg/dL 1.23(H) 1.18(H) 1.19(H)  Sodium 135 - 145 mmol/L 141 141 143  Potassium 3.5 - 5.1 mmol/L 4.1 4.7 4.2  Chloride 98 - 111 mmol/L 105 103 107  CO2 22 - 32 mmol/L 26 25 28   Calcium 8.9 - 10.3 mg/dL 9.5 9.9 9.5  Total Protein 6.5 - 8.1 g/dL 6.9 6.4 6.9  Total Bilirubin 0.3 - 1.2 mg/dL 0.7 0.5 0.8   Alkaline Phos 38 - 126 U/L 48 70 52  AST 15 - 41 U/L 23 31 27   ALT 0 - 44 U/L 13 16 18     I personally performed a face-to-face visit.  All questions were answered to patient's stated satisfaction. Encouraged patient to call with any new concerns or questions before his next visit to the cancer center and we can certain see him sooner, if needed.     ASSESSMENT & PLAN:   JAK2 V617F mutation 1.  Jak 2+ myeloproliferative disorder: - She started hydroxyurea and 11/2015, she currently is taking 1 tablet on Mondays and 1 tablet on Thursdays.  She is tolerating this therapy well. -Labs on 05/16/2019 showed WBC 6.5, hemoglobin 13.5, platelets 451, potassium 4.1, creatinine 1.23, LFTs WNL. -She denies any sores on her legs or feet. -Patient should continue the same dose of Hydrea. -She will follow-up in 6 months with repeat labs.  2. Hallucinations: - She reports this occurred after she lost her husband last year. - She reports she is still having hallucinations but they have improved. -She was referred to neurology consult.  3.  Atrial fibrillation: -Patient on Xarelto. -She is being followed by cardiology and her PCP as directed.      Orders placed this encounter:  Orders Placed This Encounter  Procedures  . Lactate dehydrogenase  . CBC with Differential/Platelet  . Comprehensive metabolic panel      Francene Finders, FNP-C [facility] [phone]

## 2019-05-16 NOTE — Assessment & Plan Note (Addendum)
1.  Jak 2+ myeloproliferative disorder: - She started hydroxyurea and 11/2015, she currently is taking 1 tablet on Mondays and 1 tablet on Thursdays.  She is tolerating this therapy well. -Labs on 05/16/2019 showed WBC 6.5, hemoglobin 13.5, platelets 451, potassium 4.1, creatinine 1.23, LFTs WNL. -She denies any sores on her legs or feet. -Patient should continue the same dose of Hydrea. -She will follow-up in 6 months with repeat labs.  2. Hallucinations: - She reports this occurred after she lost her husband last year. - She reports she is still having hallucinations but they have improved. -She was referred to neurology consult.  3.  Atrial fibrillation: -Patient on Xarelto. -She is being followed by cardiology and her PCP as directed.

## 2019-06-26 DIAGNOSIS — Z85828 Personal history of other malignant neoplasm of skin: Secondary | ICD-10-CM | POA: Diagnosis not present

## 2019-06-26 DIAGNOSIS — D225 Melanocytic nevi of trunk: Secondary | ICD-10-CM | POA: Diagnosis not present

## 2019-06-26 DIAGNOSIS — D1801 Hemangioma of skin and subcutaneous tissue: Secondary | ICD-10-CM | POA: Diagnosis not present

## 2019-06-26 DIAGNOSIS — L814 Other melanin hyperpigmentation: Secondary | ICD-10-CM | POA: Diagnosis not present

## 2019-06-26 DIAGNOSIS — L821 Other seborrheic keratosis: Secondary | ICD-10-CM | POA: Diagnosis not present

## 2019-06-28 ENCOUNTER — Ambulatory Visit: Payer: Medicare Other | Admitting: Family Medicine

## 2019-06-29 DIAGNOSIS — H469 Unspecified optic neuritis: Secondary | ICD-10-CM | POA: Insufficient documentation

## 2019-06-29 DIAGNOSIS — K635 Polyp of colon: Secondary | ICD-10-CM | POA: Insufficient documentation

## 2019-06-29 DIAGNOSIS — K5790 Diverticulosis of intestine, part unspecified, without perforation or abscess without bleeding: Secondary | ICD-10-CM | POA: Insufficient documentation

## 2019-06-29 DIAGNOSIS — Z09 Encounter for follow-up examination after completed treatment for conditions other than malignant neoplasm: Secondary | ICD-10-CM | POA: Diagnosis not present

## 2019-06-29 DIAGNOSIS — K760 Fatty (change of) liver, not elsewhere classified: Secondary | ICD-10-CM | POA: Insufficient documentation

## 2019-06-29 DIAGNOSIS — D473 Essential (hemorrhagic) thrombocythemia: Secondary | ICD-10-CM | POA: Diagnosis not present

## 2019-06-30 DIAGNOSIS — R442 Other hallucinations: Secondary | ICD-10-CM | POA: Insufficient documentation

## 2019-06-30 DIAGNOSIS — Z09 Encounter for follow-up examination after completed treatment for conditions other than malignant neoplasm: Secondary | ICD-10-CM | POA: Insufficient documentation

## 2019-07-31 DIAGNOSIS — I700 Atherosclerosis of aorta: Secondary | ICD-10-CM | POA: Diagnosis not present

## 2019-07-31 DIAGNOSIS — D473 Essential (hemorrhagic) thrombocythemia: Secondary | ICD-10-CM | POA: Diagnosis not present

## 2019-08-01 DIAGNOSIS — D693 Immune thrombocytopenic purpura: Secondary | ICD-10-CM | POA: Diagnosis not present

## 2019-08-16 ENCOUNTER — Ambulatory Visit: Payer: Medicare Other | Admitting: Family Medicine

## 2019-08-22 ENCOUNTER — Other Ambulatory Visit: Payer: Self-pay

## 2019-08-23 ENCOUNTER — Ambulatory Visit (INDEPENDENT_AMBULATORY_CARE_PROVIDER_SITE_OTHER): Payer: Medicare Other | Admitting: Family Medicine

## 2019-08-23 ENCOUNTER — Encounter: Payer: Self-pay | Admitting: Family Medicine

## 2019-08-23 VITALS — BP 134/74 | HR 80 | Temp 97.7°F | Resp 16 | Ht 62.0 in | Wt 165.4 lb

## 2019-08-23 DIAGNOSIS — K219 Gastro-esophageal reflux disease without esophagitis: Secondary | ICD-10-CM

## 2019-08-23 DIAGNOSIS — I480 Paroxysmal atrial fibrillation: Secondary | ICD-10-CM | POA: Diagnosis not present

## 2019-08-23 DIAGNOSIS — F410 Panic disorder [episodic paroxysmal anxiety] without agoraphobia: Secondary | ICD-10-CM | POA: Insufficient documentation

## 2019-08-23 DIAGNOSIS — D473 Essential (hemorrhagic) thrombocythemia: Secondary | ICD-10-CM

## 2019-08-23 DIAGNOSIS — Z79899 Other long term (current) drug therapy: Secondary | ICD-10-CM

## 2019-08-23 DIAGNOSIS — I10 Essential (primary) hypertension: Secondary | ICD-10-CM | POA: Diagnosis not present

## 2019-08-23 NOTE — Progress Notes (Signed)
Subjective: CC: Afib, HTN, Panic disorder PCP: [name], DO HPI:Kristy [name] is a 85 y.o. female presenting to clinic today for:  1.  Thrombocytosis Patient previously cared for at [facility] but notes that she transitioned her care this summer to [facility].  She is currently seeing Dr. Candie Echevaria and has follow-up in about 2 weeks.  He discontinued the hydroxyurea because she was having hallucinations.  She notes quite a bit of improvement/resolution of this since this change in medication.  She continues on Xarelto for stroke prevention/atrial fibrillation.  She denies any abnormal bleeding episodes including vaginal bleeding, rectal bleeding or bleeding with urination.  She does have easy bruising but overall this is okay.  2.  GERD Patient reports good control of GERD with Prilosec.  She has a large hiatal hernia which most often does not bother her if she takes her PPI.  Again no GI bleeding, nausea or vomiting.  3.  Panic disorder Patient reports longstanding history of anxiety and panic disorder.  She is currently treated with 75 mg of Zoloft daily and takes half a tablet of Xanax 0.25 mg 3-4 times per week.  She notes most often her panic and anxiety symptoms seem to be exacerbated in the evening times which is when she takes at this dose of benzodiazepine.  Denies any falls, memory changes or association of the medication with her hallucinations she is experiencing on the hydroxyurea.  She does not need refills at this time.  4.  Hypertension/atrial fibrillation Patient reports compliance with hydrochlorothiazide 25 mg daily and Toprol 25 mg daily.  She also takes Xarelto as above.  She denies any chest pain, shortness of breath, heart palpitations, dizziness or falls.  She does have some bilateral ankle swelling which is resolved with compression hose.  She did not wear them today because she wanted me to see.   ROS: Per HPI  Allergies  Allergen Reactions  .  Codeine Nausea Only  . Latanoprost Other (See Comments)    Red and burning  . Tramadol Nausea Only   Past Medical History:  Diagnosis Date  . A-fib ([place])   . Cataract   . Closed left arm fracture 2016  . Colon polyps   . Diverticulosis   . Endometrial hyperplasia without atypia, simple   . Endometrial polyp   . Fatty liver   . GERD (gastroesophageal reflux disease)   . Hiatal hernia   . Hyperplastic colon polyp   . Hypertension   . Lipoma   . Obesity   . Optic neuropathy of both eyes 1989, 1997  . SOB (shortness of breath)   . Thrombocytosis ([place])     Current Outpatient Medications:  .  ALPRAZolam (XANAX) 0.25 MG tablet, Take 1 tablet (0.25 mg total) by mouth daily as needed for anxiety., Disp: 90 tablet, Rfl: 0 .  Cholecalciferol (VITAMIN D3) 2000 UNITS TABS, Take 2,000 Units by mouth daily. , Disp: , Rfl:  .  glucose blood (ONE TOUCH ULTRA TEST) test strip, CHECK BLOOD SUGAR ONCE DAILY, Disp: 100 each, Rfl: 2 .  hydrochlorothiazide (HYDRODIURIL) 25 MG tablet, Take 1 tablet (25 mg total) by mouth daily., Disp: 90 tablet, Rfl: 3 .  metoprolol succinate (TOPROL-XL) 25 MG 24 hr tablet, Take 1 tablet (25 mg total) by mouth daily., Disp: 90 tablet, Rfl: 3 .  Multiple Vitamins-Minerals (EYE VITAMINS PO), Take 1 tablet by mouth daily., Disp: , Rfl:  .  omeprazole (PRILOSEC) 20 MG capsule, TAKE  1 CAPSULE BY MOUTH  DAILY, Disp: 90 capsule, Rfl: 3 .  Polyethyl Glycol-Propyl Glycol (SYSTANE OP), Apply 1-2 drops to eye 4 (four) times daily as needed (dry eyes)., Disp: , Rfl:  .  Rivaroxaban (XARELTO) 15 MG TABS tablet, Take 1 tablet (15 mg total) by mouth daily., Disp: 90 tablet, Rfl: 3 .  sertraline (ZOLOFT) 50 MG tablet, TAKE 1 AND 1/2 TABLETS BY  MOUTH ONCE DAILY, Disp: 135 tablet, Rfl: 3 .  timolol (TIMOPTIC) 0.5 % ophthalmic solution, Place 1 drop into both eyes daily. , Disp: , Rfl:  Social History   Socioeconomic History  . Marital status: Widowed    Spouse name: Not on file  .  Number of children: Not on file  . Years of education: Not on file  . Highest education level: Not on file  Occupational History  . 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 Financial resource strain: Not on file  . Food insecurity    Worry: Not on file    Inability: Not on file  . Transportation needs    Medical: Not on file    Non-medical: Not on file  Tobacco Use  . Smoking status: Former Smoker    Quit date: 12/07/1982    Years since quitting: 36.7  . Smokeless tobacco: Never Used  Substance and Sexual Activity  . Alcohol use: No  . Drug use: No  . Sexual activity: Never  Lifestyle  . Physical activity    Days per week: Not on file    Minutes per session: Not on file  . Stress: Not on file  Relationships  . Social Herbalist on phone: Not on file    Gets together: Not on file    Attends religious service: Not on file    Active member of club or organization: Not on file    Attends meetings of clubs or organizations: Not on file    Relationship status: Not on file  . Intimate partner violence    Fear of current or ex partner: Not on file    Emotionally abused: Not on file    Physically abused: Not on file    Forced sexual activity: Not on file  Other Topics Concern  . Not on file  Social History Narrative  . Not on file   Family History  Problem Relation Age of Onset  . Diabetes Mother   . Diabetes Sister   . Stroke Father   . Diabetes Brother   . Heart disease Brother   . Cancer Sister        lung  . Cancer Sister        vulva  . Cancer Sister   . Dementia Sister   . Osteoporosis Sister   . Diabetes Sister   . Osteoporosis Sister   . Diabetes Sister   . Cancer Brother        bladder  . Colon cancer Neg Hx     Objective: Office vital signs reviewed. BP 134/74   Pulse 80   Temp 97.7 F (36.5 C) (Temporal)   Resp 16   Ht _0  (1.575 m)   Wt 165 lb 6.4 oz (75 kg)    SpO2 94%   BMI 30.25 kg/m   Physical Examination:  General: Awake, alert, well nourished, No acute distress HEENT: Normal, sclera white, MMM Cardio: irregularly irregular, S1S2 heard, no murmurs appreciated Pulm:  clear to auscultation bilaterally, no wheezes, rhonchi or rales; normal work of breathing on room air Extremities: warm, well perfused, mild nonpitting ankle edema present bilaterally. no cyanosis or clubbing; +2 pulses bilaterally Psych: Mood stable, speech normal, effort appropriate, pleasant and interactive. Neuro: Hard of hearing Depression screen [facility] 2/9 08/23/2019 02/08/2019 10/26/2018  Decreased Interest 0 0 0  Down, Depressed, Hopeless 0 1 0  PHQ - 2 Score 0 1 0  Altered sleeping - - -  Tired, decreased energy - - -  Change in appetite - - -  Feeling bad or failure about yourself  - - -  Trouble concentrating - - -  Moving slowly or fidgety/restless - - -  Suicidal thoughts - - -  PHQ-9 Score - - -  Difficult doing work/chores - - -   No flowsheet data found.   Assessment/ Plan: 85 y.o. female   1. PAF (paroxysmal atrial fibrillation) (HCC) Rate controlled with no red flag bleeding - CBC; Future  2. Essential hypertension Controlled.  Continue current rate - CMP14+EGFR; Future  3. Essential thrombocytosis ([place]) Been followed by hematology.  I reviewed his most recent office note and recommendations - CBC; Future  4. Panic disorder The Narcotic Database has been reviewed.  There were no red flags.   Does not need refill benzodiazepine at this moment.  She is stable on SSRI with PRN use of benzodiazepine.  I think that given her rare use and a low dose this is okay to continue for now.  However we discussed that if she starts having instability or any other red flag signs or symptoms that we would need to consider alternative therapies.  Controlled substance contract reviewed and UDS to be obtained at next OV.  5. Controlled substance agreement signed  6.  Gastroesophageal reflux disease, esophagitis presence not specified Stable.   No orders of the defined types were placed in this encounter.  No orders of the defined types were placed in this encounter.    [name], DO [place] [phone]

## 2019-08-23 NOTE — Patient Instructions (Addendum)
I actually can see your records, so no need to have him send me records. I'd be glad to talk to him and coordinate care.

## 2019-09-07 ENCOUNTER — Other Ambulatory Visit: Payer: Self-pay

## 2019-09-07 ENCOUNTER — Other Ambulatory Visit: Payer: Medicare Other

## 2019-09-07 DIAGNOSIS — I10 Essential (primary) hypertension: Secondary | ICD-10-CM

## 2019-09-07 DIAGNOSIS — D473 Essential (hemorrhagic) thrombocythemia: Secondary | ICD-10-CM

## 2019-09-07 DIAGNOSIS — Z1589 Genetic susceptibility to other disease: Secondary | ICD-10-CM

## 2019-09-07 DIAGNOSIS — I480 Paroxysmal atrial fibrillation: Secondary | ICD-10-CM

## 2019-09-07 LAB — CMP14+EGFR
ALT: 12 IU/L (ref 0–32)
AST: 25 IU/L (ref 0–40)
Albumin/Globulin Ratio: 2.1 (ref 1.2–2.2)
Albumin: 4.5 g/dL (ref 3.6–4.6)
Alkaline Phosphatase: 77 IU/L (ref 39–117)
BUN/Creatinine Ratio: 15 (ref 12–28)
BUN: 17 mg/dL (ref 8–27)
Bilirubin Total: 0.4 mg/dL (ref 0.0–1.2)
CO2: 24 mmol/L (ref 20–29)
Calcium: 10 mg/dL (ref 8.7–10.3)
Chloride: 101 mmol/L (ref 96–106)
Creatinine, Ser: 1.14 mg/dL — ABNORMAL HIGH (ref 0.57–1.00)
GFR calc Af Amer: 50 mL/min/{1.73_m2} — ABNORMAL LOW (ref >59)
GFR calc non Af Amer: 43 mL/min/{1.73_m2} — ABNORMAL LOW (ref >59)
Globulin, Total: 2.1 g/dL (ref 1.5–4.5)
Glucose: 113 mg/dL — ABNORMAL HIGH (ref 65–99)
Potassium: 4.4 mmol/L (ref 3.5–5.2)
Sodium: 141 mmol/L (ref 134–144)
Total Protein: 6.6 g/dL (ref 6.0–8.5)

## 2019-09-07 LAB — CBC
Hematocrit: 39.9 % (ref 34.0–46.6)
Hemoglobin: 13.5 g/dL (ref 11.1–15.9)
MCH: 30.4 pg (ref 26.6–33.0)
MCHC: 33.8 g/dL (ref 31.5–35.7)
MCV: 90 fL (ref 79–97)
Platelets: 582 10*3/uL — ABNORMAL HIGH (ref 150–450)
RBC: 4.44 x10E6/uL (ref 3.77–5.28)
RDW: 13.2 % (ref 11.7–15.4)
WBC: 7.5 10*3/uL (ref 3.4–10.8)

## 2019-09-25 DIAGNOSIS — L270 Generalized skin eruption due to drugs and medicaments taken internally: Secondary | ICD-10-CM | POA: Insufficient documentation

## 2019-10-17 ENCOUNTER — Other Ambulatory Visit: Payer: Medicare Other

## 2019-10-17 ENCOUNTER — Other Ambulatory Visit: Payer: Self-pay

## 2019-10-24 ENCOUNTER — Telehealth: Payer: Self-pay | Admitting: *Deleted

## 2019-10-24 NOTE — Telephone Encounter (Signed)
Appointment schedule for later in November to check mental status per pcp.

## 2019-10-24 NOTE — Telephone Encounter (Signed)
-----   Message from [name], DO sent at 10/24/2019  1:55 PM EST ----- Please call to schedule a follow-up visit in office with me for Mini-Mental Status exam/mood testing at the request of her hematologist.

## 2019-11-06 ENCOUNTER — Ambulatory Visit: Payer: Medicare Other | Admitting: Family Medicine

## 2019-11-15 ENCOUNTER — Ambulatory Visit (HOSPITAL_COMMUNITY): Payer: Medicare Other | Admitting: Hematology

## 2019-11-15 ENCOUNTER — Other Ambulatory Visit (HOSPITAL_COMMUNITY): Payer: Medicare Other

## 2019-11-21 DIAGNOSIS — H401233 Low-tension glaucoma, bilateral, severe stage: Secondary | ICD-10-CM | POA: Diagnosis not present

## 2019-11-21 DIAGNOSIS — Z961 Presence of intraocular lens: Secondary | ICD-10-CM | POA: Diagnosis not present

## 2019-11-21 DIAGNOSIS — H04123 Dry eye syndrome of bilateral lacrimal glands: Secondary | ICD-10-CM | POA: Diagnosis not present

## 2019-11-21 DIAGNOSIS — H47013 Ischemic optic neuropathy, bilateral: Secondary | ICD-10-CM | POA: Diagnosis not present

## 2019-11-23 ENCOUNTER — Other Ambulatory Visit: Payer: Self-pay

## 2019-11-24 ENCOUNTER — Ambulatory Visit (INDEPENDENT_AMBULATORY_CARE_PROVIDER_SITE_OTHER): Payer: Medicare Other | Admitting: Family Medicine

## 2019-11-24 ENCOUNTER — Ambulatory Visit (INDEPENDENT_AMBULATORY_CARE_PROVIDER_SITE_OTHER): Payer: Medicare Other

## 2019-11-24 VITALS — BP 143/77 | HR 66 | Temp 98.2°F | Ht 62.0 in | Wt 165.0 lb

## 2019-11-24 DIAGNOSIS — H5316 Psychophysical visual disturbances: Secondary | ICD-10-CM | POA: Diagnosis not present

## 2019-11-24 DIAGNOSIS — R441 Visual hallucinations: Secondary | ICD-10-CM

## 2019-11-24 DIAGNOSIS — M79672 Pain in left foot: Secondary | ICD-10-CM | POA: Diagnosis not present

## 2019-11-24 DIAGNOSIS — S99922A Unspecified injury of left foot, initial encounter: Secondary | ICD-10-CM | POA: Diagnosis not present

## 2019-11-24 DIAGNOSIS — M7989 Other specified soft tissue disorders: Secondary | ICD-10-CM | POA: Diagnosis not present

## 2019-11-24 MED ORDER — SERTRALINE HCL 100 MG PO TABS: ORAL_TABLET | ORAL | 3 refills | Status: DC

## 2019-11-24 MED ORDER — SERTRALINE HCL 100 MG PO TABS: 100.0000 mg | ORAL_TABLET | Freq: Every day | ORAL | 3 refills | Status: DC

## 2019-11-24 NOTE — Progress Notes (Signed)
Subjective: CC: Follow-up mentation PCP: [name], DO HPI:Kristy [name] is a 85 y.o. female presenting to clinic today for:  1.  Hallucinations Patient has been having visual hallucinations since her husband passed away.  She notes that this occurs 1-2 times per week.  These are never auditory.  The hallucinations are never threatening.  They were only present for a few seconds before resolving.  She was seen by her hematologist/oncologist recently who had concerns about this.  There was initially concern that her Hydrea may have been inducing but later felt that this was not positive.  She has been on Zoloft for several years and not had problems with it.  It was increased to 75 mg earlier this year but the hallucinations seem to have preceded this.  She rarely uses alprazolam.  She reports only 1-2 times per week and sometimes none at all.  These hallucinations occur regardless of alprazolam use.  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  Apparently this is something that occurs when patients have had VF loss from glaucoma or optic neuropathy.  2.  Foot injury Patient reports 3 weeks ago she left hit her foot against a rocking chair.  She had immediate bruising and swelling.  She is not had any problems ambulating and therefore never got checked out.  She has had some soft tissue swelling and in fact took 50 mg of her hydrochlorothiazide today in efforts to relieve this.  She notes chronic bilateral lower extremity swelling.  She does have an area of point tenderness along the dorsum of the foot.   ROS: Per HPI  Allergies  Allergen Reactions  . Codeine Nausea Only  . Latanoprost Other (See Comments)    Red and burning  . Tramadol Nausea Only   Past Medical History:  Diagnosis Date  . A-fib ([place])   . Cataract   . Closed left arm fracture 2016  . Colon polyps   . Diverticulosis   .  Endometrial hyperplasia without atypia, simple   . Endometrial polyp   . Fatty liver   . GERD (gastroesophageal reflux disease)   . Hiatal hernia   . Hyperplastic colon polyp   . Hypertension   . Lipoma   . Obesity   . Optic neuropathy of both eyes 1989, 1997  . SOB (shortness of breath)   . Thrombocytosis ([place])     Current Outpatient Medications:  .  Cholecalciferol (VITAMIN D3) 2000 UNITS TABS, Take 2,000 Units by mouth daily. , Disp: , Rfl:  .  glucose blood (ONE TOUCH ULTRA TEST) test strip, CHECK BLOOD SUGAR ONCE DAILY, Disp: 100 each, Rfl: 2 .  hydrochlorothiazide (HYDRODIURIL) 25 MG tablet, Take 1 tablet (25 mg total) by mouth daily., Disp: 90 tablet, Rfl: 3 .  hydroxyurea (HYDREA) 500 MG capsule, Take by mouth., Disp: , Rfl:  .  metoprolol succinate (TOPROL-XL) 25 MG 24 hr tablet, Take 1 tablet (25 mg total) by mouth daily., Disp: 90 tablet, Rfl: 3 .  Multiple Vitamins-Minerals (EYE VITAMINS PO), Take 1 tablet by mouth daily., Disp: , Rfl:  .  omeprazole (PRILOSEC) 20 MG capsule, TAKE 1 CAPSULE BY MOUTH  DAILY, Disp: 90 capsule, Rfl: 3 .  Polyethyl Glycol-Propyl Glycol (SYSTANE OP), Apply 1-2 drops to eye 4 (four) times daily as needed (dry eyes)., Disp: , Rfl:  .  Rivaroxaban (XARELTO) 15 MG TABS tablet, Take 1 tablet (15 mg total)  by mouth daily., Disp: 90 tablet, Rfl: 3 .  sertraline (ZOLOFT) 50 MG tablet, TAKE 1 AND 1/2 TABLETS BY  MOUTH ONCE DAILY, Disp: 135 tablet, Rfl: 3 .  timolol (TIMOPTIC) 0.5 % ophthalmic solution, Place 1 drop into both eyes daily. , Disp: , Rfl:  .  ALPRAZolam (XANAX) 0.25 MG tablet, Take 1 tablet (0.25 mg total) by mouth daily as needed for anxiety. (Patient not taking: Reported on 11/24/2019), Disp: 90 tablet, Rfl: 0 Social History   Socioeconomic History  . Marital status: Widowed    Spouse name: Not on file  . Number of children: Not on file  . Years of education: Not on file  . Highest education level: Not on file  Occupational History  .  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 Smoking status: Former Smoker    Quit date: 12/07/1982    Years since quitting: 36.9  . Smokeless tobacco: Never Used  Substance and Sexual Activity  . Alcohol use: No  . Drug use: No  . Sexual activity: Never  Other Topics Concern  . Not on file  Social History Narrative  . Not on file   Social Determinants of Health   Financial Resource Strain:   . Difficulty of Paying Living Expenses: Not on file  Food Insecurity:   . Worried About Charity fundraiser in the Last Year: Not on file  . Ran Out of Food in the Last Year: Not on file  Transportation Needs:   . Lack of Transportation (Medical): Not on file  . Lack of Transportation (Non-Medical): Not on file  Physical Activity:   . Days of Exercise per Week: Not on file  . Minutes of Exercise per Session: Not on file  Stress:   . Feeling of Stress : Not on file  Social Connections:   . Frequency of Communication with Friends and Family: Not on file  . Frequency of Social Gatherings with Friends and Family: Not on file  . Attends Religious Services: Not on file  . Active Member of Clubs or Organizations: Not on file  . Attends Archivist Meetings: Not on file  . Marital Status: Not on file  Intimate Partner Violence:   . Fear of Current or Ex-Partner: Not on file  . Emotionally Abused: Not on file  . Physically Abused: Not on file  . Sexually Abused: Not on file   Family History  Problem Relation Age of Onset  . Diabetes Mother   . Diabetes Sister   . Stroke Father   . Diabetes Brother   . Heart disease Brother   . Cancer Sister        lung  . Cancer Sister        vulva  . Cancer Sister   . Dementia Sister   . Osteoporosis Sister   . Diabetes Sister   . Osteoporosis Sister   . Diabetes Sister   . Cancer Brother        bladder  . Colon cancer Neg Hx     Objective: Office vital signs  reviewed. BP (!) 143/77   Pulse 66   Temp 98.2 F (36.8 C) (Temporal)   Ht 5\' 2"  (1.575 m)   Wt 165 lb (74.8 kg)   SpO2 93%   BMI 30.18 kg/m   Physical Examination:  General: Awake, alert, well nourished, No acute distress HEENT: Sclera white.  She has a  few small areas of ecchymosis along the right side of the face Extremities: warm, well perfused, 2+ pitting edema to bilateral lower shins, cyanosis or clubbing; +2 pulses bilaterally MSK: normal, slow gait and hunched station Skin: dry; intact; no rashes or lesions Neuro: Alert oriented x3.  No focal neurologic deficits except for vision.  She has vision in the right only in the lower visual fields and vision in the left only in the upper visual fields.  MMSE - Mini Mental State Exam 11/24/2019 11/24/2019 07/07/2016  Orientation to time 5 5 5   Orientation to Place 5 5 5   Registration 3 3 3   Attention/ Calculation 5 - 5  Recall 3 - 3  Language- name 2 objects 2 - 2  Language- repeat 1 - 1  Language- follow 3 step command 3 - 3  Language- read & follow direction 1 - 1  Write a sentence 1 - 1  Copy design 1 - 1  Total score 30 - 30   DG Foot Complete Left  Result Date: 11/24/2019 CLINICAL DATA:  Pain and swelling over dorsum of left foot base of fourth metatarsal. Injury 3 weeks ago. EXAM: LEFT FOOT - COMPLETE 3+ VIEW COMPARISON:  None. FINDINGS: Mild hallux valgus deformity is present. There are minimal degenerative changes over the interphalangeal joints. No evidence of acute fracture or dislocation. IMPRESSION: No acute findings. Electronically Signed   By: Marin Olp M.D.   On: 11/24/2019 14:32     Assessment/ Plan: 85 y.o. female   1. Left foot pain Personal view of the x-ray demonstrated no acute fracture.  This was consistent with radiology review.  Perhaps she sustained a significant sprain of the foot.  She is ambulating independently and therefore she was not placed in the boots.  She may keep the foot elevated,  apply ice.  Advised against overuse of her diuretic. - DG Foot Complete Left; Future  2. Visual hallucination Apparently related to [facility] syndrome.  I reviewed her meds at length with her during today's visit and I agree that this is likely not a medication induced hallucination.  She certainly had no evidence of dementia on today's exam.  She is using the alprazolam very sparingly and appropriately.  For now we will allow her to continue this.  I did go ahead and advance her Zoloft to 100 mg today as she did discuss some increased depressive symptoms again which may be impacting since these hallucinations seem to have coincided with the death of her husband.  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  Likely the contributor of her hallucinations.   No orders of the defined types were placed in this encounter.  No orders of the defined types were placed in this encounter.    [name], DO [place] [phone]

## 2019-11-27 ENCOUNTER — Telehealth: Payer: Self-pay | Admitting: Family Medicine

## 2019-11-27 MED ORDER — CEPHALEXIN 250 MG PO CAPS: 250.0000 mg | ORAL_CAPSULE | Freq: Three times a day (TID) | ORAL | 0 refills | Status: DC

## 2019-11-27 NOTE — Telephone Encounter (Signed)
Pt aware.

## 2019-11-27 NOTE — Telephone Encounter (Signed)
Patient aware.

## 2019-11-27 NOTE — Telephone Encounter (Signed)
Returned patients call.  She was seen by Dr. Darnell Level 12/18 for her left foot.  States that her foot is no better.  She has been doing the elevation and ice as was advised.  Patient states that it is still red, swollen and hot to the touch.  Is worried it might turn into cellulitis.  Please advise - covering PCP

## 2019-11-27 NOTE — Telephone Encounter (Signed)
Keflex 250 mg 1 tablet 3 times a day is sent to her pharmacy.

## 2019-11-27 NOTE — Addendum Note (Signed)
Addended by: Terald Sleeper on: 11/27/2019 01:31 PM   Modules accepted: Orders

## 2019-12-21 ENCOUNTER — Other Ambulatory Visit: Payer: Self-pay

## 2019-12-21 ENCOUNTER — Other Ambulatory Visit: Payer: Medicare Other

## 2019-12-21 DIAGNOSIS — D473 Essential (hemorrhagic) thrombocythemia: Secondary | ICD-10-CM | POA: Diagnosis not present

## 2019-12-28 ENCOUNTER — Other Ambulatory Visit: Payer: Self-pay | Admitting: Family Medicine

## 2019-12-28 DIAGNOSIS — Z09 Encounter for follow-up examination after completed treatment for conditions other than malignant neoplasm: Secondary | ICD-10-CM | POA: Diagnosis not present

## 2019-12-28 DIAGNOSIS — D473 Essential (hemorrhagic) thrombocythemia: Secondary | ICD-10-CM | POA: Diagnosis not present

## 2020-01-29 DIAGNOSIS — Z7189 Other specified counseling: Secondary | ICD-10-CM | POA: Insufficient documentation

## 2020-01-29 DIAGNOSIS — I4892 Unspecified atrial flutter: Secondary | ICD-10-CM | POA: Insufficient documentation

## 2020-01-29 NOTE — Progress Notes (Signed)
Cardiology Office Note   Date:  01/31/2020   ID:  Kristy [name], DOB [birth year], MRN [id number]  PCP:  [name], DO  Cardiologist:   Minus Breeding, MD   Chief Complaint  Patient presents with  . Atrial Fibrillation      History of Present Illness: Kristy [name] is a 86 y.o. female who presents for follow up of atrial fib.   She had been managed with Xarelto.  She did have some pulmonary HTN and TR on echo.  However, follow up echo in Feb of 2017 demonstrated only mod TR and no significant pulmonary HTN.  She was in atrial flutter and cardioversion was planned but she went back into NSR on her own.    She does notice that her heart is skipping a little bit.  She is in atrial fibrillation.  I do not think she particularly feels this otherwise however.  She has chronic dyspnea but this was not different when she was previously in normal sinus rhythm.  She has had a previous extensive work-up as described below.   Past Medical History:  Diagnosis Date  . A-fib ([place])   . Cataract   . Closed left arm fracture 2016  . Colon polyps   . Diverticulosis   . Endometrial hyperplasia without atypia, simple   . Endometrial polyp   . Fatty liver   . GERD (gastroesophageal reflux disease)   . Hiatal hernia   . Hyperplastic colon polyp   . Hypertension   . Lipoma   . Obesity   . Optic neuropathy of both eyes 1989, 1997  . SOB (shortness of breath)   . Thrombocytosis ([place])     Past Surgical History:  Procedure Laterality Date  . breast biopsy    . CATARACT EXTRACTION    . CHOLECYSTECTOMY  03/2002  . DILATION AND CURETTAGE OF UTERUS     x 2  . EYE SURGERY    . HERNIA REPAIR    . KNEE SURGERY    . optic neuritis    . umbilical hernia repair  09/2002     Current Outpatient Medications  Medication Sig Dispense Refill  . ALPRAZolam (XANAX) 0.25 MG tablet Take 1 tablet (0.25 mg total) by mouth daily as needed for anxiety. 90 tablet 0  . Cholecalciferol (VITAMIN D3)  2000 UNITS TABS Take 2,000 Units by mouth daily.     . hydrochlorothiazide (HYDRODIURIL) 25 MG tablet TAKE 1 TABLET BY MOUTH  DAILY 90 tablet 1  . hydroxyurea (HYDREA) 500 MG capsule 500 mg. Take one tablet by mouth two times a week    . metoprolol succinate (TOPROL-XL) 25 MG 24 hr tablet TAKE 1 TABLET BY MOUTH  DAILY 90 tablet 1  . Multiple Vitamins-Minerals (EYE VITAMINS PO) Take 1 tablet by mouth daily.    Marland Kitchen omeprazole (PRILOSEC) 20 MG capsule TAKE 1 CAPSULE BY MOUTH  DAILY 90 capsule 3  . Polyethyl Glycol-Propyl Glycol (SYSTANE OP) Apply 1-2 drops to eye 4 (four) times daily as needed (dry eyes).    . Rivaroxaban (XARELTO) 15 MG TABS tablet Take 1 tablet (15 mg total) by mouth daily. 90 tablet 3  . sertraline (ZOLOFT) 100 MG tablet Take 1 tablet (100 mg total) by mouth daily. 90 tablet 3  . timolol (TIMOPTIC) 0.5 % ophthalmic solution Place 1 drop into both eyes daily.     Marland Kitchen glucose blood (ONE TOUCH ULTRA TEST) test strip CHECK BLOOD SUGAR ONCE DAILY 100 each 2  No current facility-administered medications for this visit.    Allergies:   Codeine, Latanoprost, and Tramadol    ROS:  Please see the history of present illness.   Otherwise, review of systems are positive for none.   All other systems are reviewed and negative.    PHYSICAL EXAM: VS:  BP 140/84   Pulse 67   Ht 5\' 2"  (1.575 m)   Wt 161 lb (73 kg)   BMI 29.45 kg/m  , BMI Body mass index is 29.45 kg/m.  GENERAL:  Well appearing NECK:  No jugular venous distention, waveform within normal limits, carotid upstroke brisk and symmetric, no bruits, no thyromegaly LUNGS:  Clear to auscultation bilaterally CHEST:  Unremarkable HEART:  PMI not displaced or sustained,S1 and S2 within normal limits, no S3,  no clicks, no rubs, no murmurs, irregular ABD:  Flat, positive bowel sounds normal in frequency in pitch, no bruits, no rebound, no guarding, no midline pulsatile mass, no hepatomegaly, no splenomegaly EXT:  2 plus pulses  throughout, no edema, no cyanosis no clubbing    EKG:  EKG is ordered today. The ekg ordered today demonstrates atrial fibrillation, rate 67, left axis deviation, poor anterior R wave progression.   Recent Labs: 09/07/2019: ALT 12; BUN 17; Creatinine, Ser 1.14; Hemoglobin 13.5; Platelets 582; Potassium 4.4; Sodium 141    Lipid Panel    Component Value Date/Time   CHOL 166 02/08/2019 1107   TRIG 113 02/08/2019 1107   HDL 53 02/08/2019 1107   CHOLHDL 3.1 02/08/2019 1107   LDLCALC 90 02/08/2019 1107      Wt Readings from Last 3 Encounters:  01/31/20 161 lb (73 kg)  11/24/19 165 lb (74.8 kg)  08/23/19 165 lb 6.4 oz (75 kg)      Other studies Reviewed: Additional studies/ records that were reviewed today include: None. Review of the above records demonstrates:  Please see elsewhere in the note.     ASSESSMENT AND PLAN:  ATRIAL FLUTTER:   [name] has a CHA2DS2 - VASc score of 5 with a risk of stroke of 6.7% .    She tolerates anticoagulation.  I do not think she is symptomatic with the fibrillation.  No change in therapy.  HTN:  The blood pressure is at target.  No change in therapy.   DYSPNEA:    This is at baseline.  She has had an extensive work-up.  She is in no distress.  No change in therapy.   COVID EDUCATION: She was under the impression that she could not take the vaccine because of hydroxyurea.  I asked her to discuss this with her hematologist.  Current medicines are reviewed at length with the patient today.  The patient does not have concerns regarding medicines.  The following changes have been made:  no change  Labs/ tests ordered today include: None  Orders Placed This Encounter  Procedures  . EKG 12-Lead     Disposition:   FU with me in 12 months.     Signed, Minus Breeding, MD  01/31/2020 12:41 PM    [facility]

## 2020-01-31 ENCOUNTER — Encounter: Payer: Self-pay | Admitting: Cardiology

## 2020-01-31 ENCOUNTER — Other Ambulatory Visit: Payer: Self-pay

## 2020-01-31 ENCOUNTER — Ambulatory Visit (INDEPENDENT_AMBULATORY_CARE_PROVIDER_SITE_OTHER): Payer: Medicare Other | Admitting: Cardiology

## 2020-01-31 VITALS — BP 140/84 | HR 67 | Ht 62.0 in | Wt 161.0 lb

## 2020-01-31 DIAGNOSIS — I10 Essential (primary) hypertension: Secondary | ICD-10-CM

## 2020-01-31 DIAGNOSIS — R0602 Shortness of breath: Secondary | ICD-10-CM

## 2020-01-31 DIAGNOSIS — Z7189 Other specified counseling: Secondary | ICD-10-CM | POA: Diagnosis not present

## 2020-01-31 DIAGNOSIS — I4892 Unspecified atrial flutter: Secondary | ICD-10-CM | POA: Diagnosis not present

## 2020-01-31 NOTE — Patient Instructions (Signed)
Medication Instructions:  The current medical regimen is effective;  continue present plan and medications.  *If you need a refill on your cardiac medications before your next appointment, please call your pharmacy*  Follow-Up: At [facility], you and your health needs are our priority.  As part of our continuing mission to provide you with exceptional heart care, we have created designated Provider Care Teams.  These Care Teams include your primary Cardiologist (physician) and Advanced Practice Providers (APPs -  Physician Assistants and Nurse Practitioners) who all work together to provide you with the care you need, when you need it.  Your next appointment:   12 month(s)  The format for your next appointment:   In Person  Provider:   James Hochrein, MD  Thank you for choosing [facility]!!     

## 2020-02-09 ENCOUNTER — Other Ambulatory Visit: Payer: Self-pay | Admitting: Family Medicine

## 2020-02-29 ENCOUNTER — Other Ambulatory Visit: Payer: Self-pay | Admitting: Family Medicine

## 2020-03-11 ENCOUNTER — Ambulatory Visit (INDEPENDENT_AMBULATORY_CARE_PROVIDER_SITE_OTHER): Payer: Medicare Other | Admitting: Family Medicine

## 2020-03-11 ENCOUNTER — Other Ambulatory Visit: Payer: Self-pay

## 2020-03-11 ENCOUNTER — Ambulatory Visit (INDEPENDENT_AMBULATORY_CARE_PROVIDER_SITE_OTHER): Payer: Medicare Other

## 2020-03-11 VITALS — BP 137/82 | HR 82 | Temp 98.6°F | Ht 62.0 in | Wt 159.0 lb

## 2020-03-11 DIAGNOSIS — M5442 Lumbago with sciatica, left side: Secondary | ICD-10-CM

## 2020-03-11 DIAGNOSIS — M5136 Other intervertebral disc degeneration, lumbar region: Secondary | ICD-10-CM | POA: Diagnosis not present

## 2020-03-11 MED ORDER — METHYLPREDNISOLONE ACETATE 40 MG/ML IJ SUSP
40.0000 mg | Freq: Once | INTRAMUSCULAR | Status: AC
  Administered 2020-03-11: 40 mg via INTRAMUSCULAR

## 2020-03-11 MED ORDER — PREDNISONE 10 MG (21) PO TBPK: ORAL_TABLET | ORAL | 0 refills | Status: DC

## 2020-03-11 NOTE — Patient Instructions (Signed)
START PREDNISONE TOMORROW. Do the physical therapy exercises.  Call me in 2 weeks and let me know if you want to start gabapentin.

## 2020-03-11 NOTE — Progress Notes (Signed)
Subjective: CC: Low back pain with left-sided radiation PCP: [name], DO HPI:Kristy [name] is a 86 y.o. female presenting to clinic today for:  1.  Low back pain Patient reports onset about 1 week ago.  She reports low back pain that radiates down the left lower extremity.  This is more prevalent when she is standing for more than 10 or 15 minutes.  It resolves with sitting.  She denies any weakness in the left lower extremity but feels like she has an unstable sensation in the left lower extremity.  She has history of knee surgery previously at [place].  Denies any sensory changes within the groin.  No fecal incontinence or urinary retention.  She has been using Tylenol with this because she is chronically anticoagulated with Xarelto.   ROS: Per HPI  Allergies  Allergen Reactions  . Codeine Nausea Only  . Latanoprost Other (See Comments)    Red and burning  . Tramadol Nausea Only   Past Medical History:  Diagnosis Date  . A-fib ([place])   . Cataract   . Closed left arm fracture 2016  . Colon polyps   . Diverticulosis   . Endometrial hyperplasia without atypia, simple   . Endometrial polyp   . Fatty liver   . GERD (gastroesophageal reflux disease)   . Hiatal hernia   . Hyperplastic colon polyp   . Hypertension   . Lipoma   . Obesity   . Optic neuropathy of both eyes 1989, 1997  . SOB (shortness of breath)   . Thrombocytosis ([place])     Current Outpatient Medications:  .  ALPRAZolam (XANAX) 0.25 MG tablet, Take 1 tablet (0.25 mg total) by mouth daily as needed for anxiety., Disp: 90 tablet, Rfl: 0 .  Cholecalciferol (VITAMIN D3) 2000 UNITS TABS, Take 2,000 Units by mouth daily. , Disp: , Rfl:  .  glucose blood (ONE TOUCH ULTRA TEST) test strip, CHECK BLOOD SUGAR ONCE DAILY, Disp: 100 each, Rfl: 2 .  hydrochlorothiazide (HYDRODIURIL) 25 MG tablet, TAKE 1 TABLET BY MOUTH  DAILY, Disp: 90 tablet, Rfl: 1 .  hydroxyurea (HYDREA) 500 MG capsule, 500 mg. Take one  tablet by mouth two times a week, Disp: , Rfl:  .  metoprolol succinate (TOPROL-XL) 25 MG 24 hr tablet, TAKE 1 TABLET BY MOUTH  DAILY, Disp: 90 tablet, Rfl: 1 .  Multiple Vitamins-Minerals (EYE VITAMINS PO), Take 1 tablet by mouth daily., Disp: , Rfl:  .  omeprazole (PRILOSEC) 20 MG capsule, TAKE 1 CAPSULE BY MOUTH  DAILY, Disp: 90 capsule, Rfl: 3 .  Polyethyl Glycol-Propyl Glycol (SYSTANE OP), Apply 1-2 drops to eye 4 (four) times daily as needed (dry eyes)., Disp: , Rfl:  .  sertraline (ZOLOFT) 100 MG tablet, Take 1 tablet (100 mg total) by mouth daily., Disp: 90 tablet, Rfl: 3 .  timolol (TIMOPTIC) 0.5 % ophthalmic solution, Place 1 drop into both eyes daily. , Disp: , Rfl:  .  XARELTO 15 MG TABS tablet, TAKE 1 TABLET BY MOUTH  DAILY, Disp: 90 tablet, Rfl: 0 Social History   Socioeconomic History  . Marital status: Widowed    Spouse name: Not on file  . Number of children: Not on file  . Years of education: Not on file  . Highest education level: Not on file  Occupational History  . 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 Smoking status: Former Smoker  Quit date: 12/07/1982    Years since quitting: 37.2  . Smokeless tobacco: Never Used  Substance and Sexual Activity  . Alcohol use: No  . Drug use: No  . Sexual activity: Never  Other Topics Concern  . Not on file  Social History Narrative  . Not on file   Social Determinants of Health   Financial Resource Strain:   . Difficulty of Paying Living Expenses:   Food Insecurity:   . Worried About Charity fundraiser in the Last Year:   . Arboriculturist in the Last Year:   Transportation Needs:   . Film/video editor (Medical):   Marland Kitchen Lack of Transportation (Non-Medical):   Physical Activity:   . Days of Exercise per Week:   . Minutes of Exercise per Session:   Stress:   . Feeling of Stress :   Social Connections:   . Frequency of Communication with  Friends and Family:   . Frequency of Social Gatherings with Friends and Family:   . Attends Religious Services:   . Active Member of Clubs or Organizations:   . Attends Archivist Meetings:   Marland Kitchen Marital Status:   Intimate Partner Violence:   . Fear of Current or Ex-Partner:   . Emotionally Abused:   Marland Kitchen Physically Abused:   . Sexually Abused:    Family History  Problem Relation Age of Onset  . Diabetes Mother   . Diabetes Sister   . Stroke Father   . Diabetes Brother   . Heart disease Brother   . Cancer Sister        lung  . Cancer Sister        vulva  . Cancer Sister   . Dementia Sister   . Osteoporosis Sister   . Diabetes Sister   . Osteoporosis Sister   . Diabetes Sister   . Cancer Brother        bladder  . Colon cancer Neg Hx     Objective: Office vital signs reviewed. BP 137/82   Pulse 82   Temp 98.6 F (37 C) (Temporal)   Ht 5\' 2"  (1.575 m)   Wt 159 lb (72.1 kg)   SpO2 92%   BMI 29.08 kg/m   Physical Examination:  General: Awake, alert, well appearing elderly female, No acute distress MSK: slow gait and hunched station  Lumbar spine: Flattening of the lumbar spine noted.  No midline tenderness palpation.  No paraspinal muscle tenderness palpation.  Negative straight leg raise bilaterally. Skin: dry; intact; no rashes or lesions Neuro: 4/5 extension of LLE. Otherwise, Strength and light touch sensation grossly intact, patellar DTRs 1/4  Assessment/ Plan: 86 y.o. female   1. Acute left-sided low back pain with left-sided sciatica Degenerative changes noted within the lumbar spine.  Specifically, prevalent at L2-3 with notable spurring.  However, she has spurring at multiple levels.  She has a has to space narrowing at this level.  We are going to treat her with a steroid course to see if perhaps this will help.  I have also given her home physical therapy to start.  She will have her granddaughter, who just got her doctor in physical therapy to help  her with this as well.  We discussed that if symptoms are not improving sufficiently and she continues to have sciatic/radicular symptoms down the left lower leg, we will plan to add gabapentin.  This was not added today in efforts to avoid polypharmacy.  I also  offered referral to orthopedics but she would like to hold off on this for now. - DG Lumbar Spine 2-3 Views - methylPREDNISolone acetate (DEPO-MEDROL) injection 40 mg - predniSONE (STERAPRED UNI-PAK 21 TAB) 10 MG (21) TBPK tablet; As directed x 6 days  Dispense: 21 tablet; Refill: 0   No orders of the defined types were placed in this encounter.  No orders of the defined types were placed in this encounter.    [name], DO [place] [phone]

## 2020-03-15 ENCOUNTER — Telehealth: Payer: Self-pay

## 2020-03-15 ENCOUNTER — Other Ambulatory Visit: Payer: Self-pay | Admitting: Family Medicine

## 2020-03-15 MED ORDER — SERTRALINE HCL 50 MG PO TABS: 75.0000 mg | ORAL_TABLET | Freq: Every day | ORAL | 1 refills | Status: DC

## 2020-03-15 NOTE — Telephone Encounter (Signed)
Done

## 2020-03-15 NOTE — Telephone Encounter (Signed)
Patient aware.

## 2020-03-15 NOTE — Telephone Encounter (Signed)
Patient called for a refill of sertaline 50mg  1 1/2 tablets daily.  Per chart patient is on sertaline 100mg .  Patient states she has been on 75mg .  Please advise and send to [facility].

## 2020-03-31 ENCOUNTER — Other Ambulatory Visit: Payer: Self-pay | Admitting: Family Medicine

## 2020-04-02 ENCOUNTER — Other Ambulatory Visit (HOSPITAL_COMMUNITY): Payer: Self-pay | Admitting: *Deleted

## 2020-04-02 DIAGNOSIS — Z1589 Genetic susceptibility to other disease: Secondary | ICD-10-CM

## 2020-04-02 DIAGNOSIS — D473 Essential (hemorrhagic) thrombocythemia: Secondary | ICD-10-CM

## 2020-04-03 ENCOUNTER — Inpatient Hospital Stay (HOSPITAL_COMMUNITY): Payer: Medicare Other | Admitting: Nurse Practitioner

## 2020-04-03 ENCOUNTER — Other Ambulatory Visit: Payer: Self-pay

## 2020-04-03 ENCOUNTER — Inpatient Hospital Stay (HOSPITAL_COMMUNITY): Payer: Medicare Other | Attending: Hematology

## 2020-04-03 DIAGNOSIS — Z87891 Personal history of nicotine dependence: Secondary | ICD-10-CM | POA: Diagnosis not present

## 2020-04-03 DIAGNOSIS — Z1589 Genetic susceptibility to other disease: Secondary | ICD-10-CM

## 2020-04-03 DIAGNOSIS — E669 Obesity, unspecified: Secondary | ICD-10-CM | POA: Diagnosis not present

## 2020-04-03 DIAGNOSIS — Z79899 Other long term (current) drug therapy: Secondary | ICD-10-CM | POA: Diagnosis not present

## 2020-04-03 DIAGNOSIS — C946 Myelodysplastic disease, not classified: Secondary | ICD-10-CM | POA: Insufficient documentation

## 2020-04-03 DIAGNOSIS — I10 Essential (primary) hypertension: Secondary | ICD-10-CM | POA: Insufficient documentation

## 2020-04-03 DIAGNOSIS — Z8719 Personal history of other diseases of the digestive system: Secondary | ICD-10-CM | POA: Insufficient documentation

## 2020-04-03 DIAGNOSIS — I4891 Unspecified atrial fibrillation: Secondary | ICD-10-CM | POA: Diagnosis not present

## 2020-04-03 DIAGNOSIS — D473 Essential (hemorrhagic) thrombocythemia: Secondary | ICD-10-CM

## 2020-04-03 DIAGNOSIS — Z7901 Long term (current) use of anticoagulants: Secondary | ICD-10-CM | POA: Insufficient documentation

## 2020-04-03 DIAGNOSIS — R443 Hallucinations, unspecified: Secondary | ICD-10-CM | POA: Insufficient documentation

## 2020-04-03 LAB — CBC WITH DIFFERENTIAL/PLATELET
Abs Immature Granulocytes: 0.03 10*3/uL (ref 0.00–0.07)
Basophils Absolute: 0.1 10*3/uL (ref 0.0–0.1)
Basophils Relative: 1 %
Eosinophils Absolute: 0.1 10*3/uL (ref 0.0–0.5)
Eosinophils Relative: 1 %
HCT: 41 % (ref 36.0–46.0)
Hemoglobin: 13.5 g/dL (ref 12.0–15.0)
Immature Granulocytes: 0 %
Lymphocytes Relative: 16 %
Lymphs Abs: 1.2 10*3/uL (ref 0.7–4.0)
MCH: 31.2 pg (ref 26.0–34.0)
MCHC: 32.9 g/dL (ref 30.0–36.0)
MCV: 94.7 fL (ref 80.0–100.0)
Monocytes Absolute: 0.8 10*3/uL (ref 0.1–1.0)
Monocytes Relative: 10 %
Neutro Abs: 5.5 10*3/uL (ref 1.7–7.7)
Neutrophils Relative %: 72 %
Platelets: 415 10*3/uL — ABNORMAL HIGH (ref 150–400)
RBC: 4.33 MIL/uL (ref 3.87–5.11)
RDW: 14.8 % (ref 11.5–15.5)
WBC: 7.7 10*3/uL (ref 4.0–10.5)
nRBC: 0 % (ref 0.0–0.2)

## 2020-04-03 LAB — COMPREHENSIVE METABOLIC PANEL
ALT: 13 U/L (ref 0–44)
AST: 20 U/L (ref 15–41)
Albumin: 4.2 g/dL (ref 3.5–5.0)
Alkaline Phosphatase: 61 U/L (ref 38–126)
Anion gap: 12 (ref 5–15)
BUN: 26 mg/dL — ABNORMAL HIGH (ref 8–23)
CO2: 26 mmol/L (ref 22–32)
Calcium: 9.5 mg/dL (ref 8.9–10.3)
Chloride: 103 mmol/L (ref 98–111)
Creatinine, Ser: 1.26 mg/dL — ABNORMAL HIGH (ref 0.44–1.00)
GFR calc Af Amer: 44 mL/min — ABNORMAL LOW (ref >60)
GFR calc non Af Amer: 38 mL/min — ABNORMAL LOW (ref >60)
Glucose, Bld: 126 mg/dL — ABNORMAL HIGH (ref 70–99)
Potassium: 3.9 mmol/L (ref 3.5–5.1)
Sodium: 141 mmol/L (ref 135–145)
Total Bilirubin: 0.7 mg/dL (ref 0.3–1.2)
Total Protein: 6.8 g/dL (ref 6.5–8.1)

## 2020-04-03 LAB — LACTATE DEHYDROGENASE: LDH: 182 U/L (ref 98–192)

## 2020-04-03 MED ORDER — HYDROXYUREA 500 MG PO CAPS: 500.0000 mg | ORAL_CAPSULE | Freq: Every day | ORAL | 3 refills | Status: DC

## 2020-04-03 NOTE — Patient Instructions (Signed)
[facility] Discharge Instructions  Follow up in 6 months with labs    Thank you for choosing [facility] to provide your oncology and hematology care.  To afford each patient quality time with our provider, please arrive at least 15 minutes before your scheduled appointment time.   If you have a lab appointment with the Cancer Center please come in thru the Main Entrance and check in at the main information desk.  You need to re-schedule your appointment should you arrive 10 or more minutes late.  We strive to give you quality time with our providers, and arriving late affects you and other patients whose appointments are after yours.  Also, if you no show three or more times for appointments you may be dismissed from the clinic at the providers discretion.     Again, thank you for choosing [facility].  Our hope is that these requests will decrease the amount of time that you wait before being seen by our physicians.       _____________________________________________________________  Should you have questions after your visit to [facility], please contact our office at [phone] between the hours of 8:00 a.m. and 4:30 p.m.  Voicemails left after 4:00 p.m. will not be returned until the following business day.  For prescription refill requests, have your pharmacy contact our office and allow 72 hours.    Due to Covid, you will need to wear a mask upon entering the hospital. If you do not have a mask, a mask will be given to you at the Main Entrance upon arrival. For doctor visits, patients may have 1 support person with them. For treatment visits, patients can not have anyone with them due to social distancing guidelines and our immunocompromised population.      

## 2020-04-03 NOTE — Assessment & Plan Note (Addendum)
1.  Jak 2+ myeloproliferative disorder: - She started hydroxyurea and 11/2015, she currently is taking 1 tablet on Mondays and 1 tablet on Thursdays.  She is tolerating this therapy well. -Labs on 04/03/2020 showed WBC 7.7, hemoglobin 13.5, hematocrit 41.0, platelets 415 -She denies any sores on her legs or feet. -Patient should continue the same dose of Hydrea. -She will follow-up in 6 months with repeat labs.  2. Hallucinations: - She reports this occurred after she lost her husband last year. - She reports she is still having hallucinations but they have improved. -She was referred to neurology consult. -This has resolved.  3.  Atrial fibrillation: -Patient on Xarelto. -She is being followed by cardiology and her PCP as directed.

## 2020-04-03 NOTE — Progress Notes (Signed)
[place], [place] [address]   CLINIC:  Medical Oncology/Hematology  PCP:  [name], DO [place] [id number] [phone]   REASON FOR VISIT: Follow-up for JAK2 positive myeloproliferative disorder   CURRENT THERAPY: Hydrea twice a week   INTERVAL HISTORY:  Ms. Knope 87 y.o. female returns for routine follow-up for JAK2 positive polycythemia.  Patient reports she has been doing well since her last visit.  She did have 2 episodes of falls and shingles since the last time she has seen [place].  She denies any new leg ulcers.  She denies any new pains.  She denies any B symptoms including fevers, chills, night sweats or unexplained weight loss. Denies any nausea, vomiting, or diarrhea. Denies any new pains. Had not noticed any recent bleeding such as epistaxis, hematuria or hematochezia. Denies recent chest pain on exertion, shortness of breath on minimal exertion, pre-syncopal episodes, or palpitations. Denies any numbness or tingling in hands or feet. Denies any recent fevers, infections, or recent hospitalizations. Patient reports appetite at 100% and energy level at 0%.  Patient is eating well maintain her weight at this time.   REVIEW OF SYSTEMS:  Review of Systems  Constitutional: Positive for fatigue.  Respiratory: Positive for shortness of breath.   Cardiovascular: Positive for leg swelling.  Gastrointestinal: Positive for diarrhea.  Neurological: Positive for dizziness.  All other systems reviewed and are negative.    PAST MEDICAL/SURGICAL HISTORY:  Past Medical History:  Diagnosis Date  . A-fib ([place])   . Cataract   . Closed left arm fracture 2016  . Colon polyps   . Diverticulosis   . Endometrial hyperplasia without atypia, simple   . Endometrial polyp   . Fatty liver   . GERD (gastroesophageal reflux disease)   . Hiatal hernia   . Hyperplastic colon polyp   . Hypertension   . Lipoma   . Obesity   .  Optic neuropathy of both eyes 1989, 1997  . SOB (shortness of breath)   . Thrombocytosis ([place])    Past Surgical History:  Procedure Laterality Date  . breast biopsy    . CATARACT EXTRACTION    . CHOLECYSTECTOMY  03/2002  . DILATION AND CURETTAGE OF UTERUS     x 2  . EYE SURGERY    . HERNIA REPAIR    . KNEE SURGERY    . optic neuritis    . umbilical hernia repair  09/2002     SOCIAL HISTORY:  Social History   Socioeconomic History  . Marital status: Widowed    Spouse name: Not on file  . Number of children: Not on file  . Years of education: Not on file  . Highest education level: Not on file  Occupational History  . 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 Smoking status: Former Smoker    Quit date: 12/07/1982    Years since quitting: 37.3  . Smokeless tobacco: Never Used  Substance and Sexual Activity  . Alcohol use: No  . Drug use: No  . Sexual activity: Never  Other Topics Concern  . Not on file  Social History Narrative  . Not on file   Social Determinants of Health   Financial Resource Strain:   . Difficulty of Paying Living Expenses:   Food Insecurity:   . Worried About Charity fundraiser in the Last Year:   .  Ran Out of Food in the Last Year:   Transportation Needs:   . Film/video editor (Medical):   Marland Kitchen Lack of Transportation (Non-Medical):   Physical Activity:   . Days of Exercise per Week:   . Minutes of Exercise per Session:   Stress:   . Feeling of Stress :   Social Connections:   . Frequency of Communication with Friends and Family:   . Frequency of Social Gatherings with Friends and Family:   . Attends Religious Services:   . Active Member of Clubs or Organizations:   . Attends Archivist Meetings:   Marland Kitchen Marital Status:   Intimate Partner Violence:   . Fear of Current or Ex-Partner:   . Emotionally Abused:   Marland Kitchen Physically Abused:   . Sexually Abused:      FAMILY HISTORY:  Family History  Problem Relation Age of Onset  . Diabetes Mother   . Diabetes Sister   . Stroke Father   . Diabetes Brother   . Heart disease Brother   . Cancer Sister        lung  . Cancer Sister        vulva  . Cancer Sister   . Dementia Sister   . Osteoporosis Sister   . Diabetes Sister   . Osteoporosis Sister   . Diabetes Sister   . Cancer Brother        bladder  . Colon cancer Neg Hx     CURRENT MEDICATIONS:  Outpatient Encounter Medications as of 04/03/2020  Medication Sig  . Cholecalciferol (VITAMIN D3) 2000 UNITS TABS Take 2,000 Units by mouth daily.   Marland Kitchen glucose blood (ONE TOUCH ULTRA TEST) test strip CHECK BLOOD SUGAR ONCE DAILY  . hydrochlorothiazide (HYDRODIURIL) 25 MG tablet TAKE 1 TABLET BY MOUTH  DAILY  . hydroxyurea (HYDREA) 500 MG capsule Take 1 capsule (500 mg total) by mouth daily. Take one tablet by mouth two times a week  . metoprolol succinate (TOPROL-XL) 25 MG 24 hr tablet TAKE 1 TABLET BY MOUTH  DAILY  . Multiple Vitamins-Minerals (EYE VITAMINS PO) Take 1 tablet by mouth daily.  Marland Kitchen omeprazole (PRILOSEC) 20 MG capsule TAKE 1 CAPSULE BY MOUTH  DAILY (Needs to be seen before next refill)  . sertraline (ZOLOFT) 50 MG tablet Take 1.5 tablets (75 mg total) by mouth daily.  . timolol (TIMOPTIC) 0.5 % ophthalmic solution Place 1 drop into both eyes daily.   Alveda Reasons 15 MG TABS tablet TAKE 1 TABLET BY MOUTH  DAILY  . [DISCONTINUED] hydroxyurea (HYDREA) 500 MG capsule 500 mg. Take one tablet by mouth two times a week  . ALPRAZolam (XANAX) 0.25 MG tablet Take 1 tablet (0.25 mg total) by mouth daily as needed for anxiety. (Patient not taking: Reported on 04/03/2020)  . Polyethyl Glycol-Propyl Glycol (SYSTANE OP) Apply 1-2 drops to eye 4 (four) times daily as needed (dry eyes).  . [DISCONTINUED] predniSONE (STERAPRED UNI-PAK 21 TAB) 10 MG (21) TBPK tablet As directed x 6 days   No facility-administered encounter medications on file as of 04/03/2020.     ALLERGIES:  Allergies  Allergen Reactions  . Codeine Nausea Only  . Latanoprost Other (See Comments)    Red and burning  . Tramadol Nausea Only     PHYSICAL EXAM:  ECOG Performance status: 1  Vitals:   04/03/20 1012  BP: (!) 153/63  Pulse: 64  Resp: 18  Temp: (!) 96.8 F (36 C)  SpO2: 95%   Filed Weights  04/03/20 1012  Weight: 157 lb (71.2 kg)      Physical Exam Constitutional:      Appearance: Normal appearance. She is normal weight.  Cardiovascular:     Rate and Rhythm: Normal rate and regular rhythm.     Heart sounds: Normal heart sounds.  Pulmonary:     Effort: Pulmonary effort is normal.     Breath sounds: Normal breath sounds.  Abdominal:     General: Bowel sounds are normal.     Palpations: Abdomen is soft.  Musculoskeletal:        General: Normal range of motion.  Skin:    General: Skin is warm.  Neurological:     Mental Status: She is alert and oriented to person, place, and time. Mental status is at baseline.  Psychiatric:        Mood and Affect: Mood normal.        Behavior: Behavior normal.        Thought Content: Thought content normal.        Judgment: Judgment normal.      LABORATORY DATA:  I have reviewed the labs as listed.  CBC    Component Value Date/Time   WBC 7.7 04/03/2020 0925   RBC 4.33 04/03/2020 0925   HGB 13.5 04/03/2020 0925   HGB 13.5 09/07/2019 1030   HCT 41.0 04/03/2020 0925   HCT 39.9 09/07/2019 1030   PLT 415 (H) 04/03/2020 0925   PLT 582 (H) 09/07/2019 1030   MCV 94.7 04/03/2020 0925   MCV 90 09/07/2019 1030   MCH 31.2 04/03/2020 0925   MCHC 32.9 04/03/2020 0925   RDW 14.8 04/03/2020 0925   RDW 13.2 09/07/2019 1030   LYMPHSABS 1.2 04/03/2020 0925   LYMPHSABS 1.3 02/08/2019 1107   MONOABS 0.8 04/03/2020 0925   EOSABS 0.1 04/03/2020 0925   EOSABS 0.1 02/08/2019 1107   BASOSABS 0.1 04/03/2020 0925   BASOSABS 0.1 02/08/2019 1107   CMP Latest Ref Rng & Units 09/07/2019 05/16/2019 02/08/2019  Glucose 65 -  99 mg/dL 113(H) 106(H) 96  BUN 8 - 27 mg/dL 17 23 19   Creatinine 0.57 - 1.00 mg/dL 1.14(H) 1.23(H) 1.18(H)  Sodium 134 - 144 mmol/L 141 141 141  Potassium 3.5 - 5.2 mmol/L 4.4 4.1 4.7  Chloride 96 - 106 mmol/L 101 105 103  CO2 20 - 29 mmol/L 24 26 25   Calcium 8.7 - 10.3 mg/dL 10.0 9.5 9.9  Total Protein 6.0 - 8.5 g/dL 6.6 6.9 6.4  Total Bilirubin 0.0 - 1.2 mg/dL 0.4 0.7 0.5  Alkaline Phos 39 - 117 IU/L 77 48 70  AST 0 - 40 IU/L 25 23 31   ALT 0 - 32 IU/L 12 13 16    All questions were answered to patient's stated satisfaction. Encouraged patient to call with any new concerns or questions before his next visit to the cancer center and we can certain see him sooner, if needed.     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  Jak 2+ myeloproliferative disorder: - She started hydroxyurea and 11/2015, she currently is taking 1 tablet on Mondays and 1 tablet on Thursdays.  She is tolerating this therapy well. -Labs on 04/03/2020 showed WBC 7.7, hemoglobin 13.5, hematocrit 41.0, platelets 415 -She denies any sores on her legs or feet. -Patient should continue the same dose of Hydrea. -She will follow-up in 6 months with repeat labs.  2. Hallucinations: - She reports this occurred after she lost her husband last year. - She  reports she is still having hallucinations but they have improved. -She was referred to neurology consult. -This has resolved.  3.  Atrial fibrillation: -Patient on Xarelto. -She is being followed by cardiology and her PCP as directed.     Orders placed this encounter:  Orders Placed This Encounter  Procedures  . Lactate dehydrogenase  . CBC with Differential/Platelet  . Comprehensive metabolic panel  . Vitamin B12  . VITAMIN D 25 Hydroxy (Vit-D Deficiency, Fractures)      Francene Finders, FNP-C [place] [phone]

## 2020-04-04 ENCOUNTER — Telehealth (HOSPITAL_COMMUNITY): Payer: Self-pay | Admitting: Surgery

## 2020-04-04 NOTE — Telephone Encounter (Signed)
Pt had left a voicemail asking if someone could call her with the lab results which had not resulted from yesterday.  I spoke with Francene Finders before calling the pt, and per [facility], the pt's kidney function is still elevated but has not changed much.  I called the pt and she verbalized understanding about her kidney function.  She was told to call our office back if she had any questions or concerns.

## 2020-05-01 ENCOUNTER — Other Ambulatory Visit: Payer: Self-pay | Admitting: Family Medicine

## 2020-05-23 DIAGNOSIS — H04123 Dry eye syndrome of bilateral lacrimal glands: Secondary | ICD-10-CM | POA: Diagnosis not present

## 2020-05-23 DIAGNOSIS — Z961 Presence of intraocular lens: Secondary | ICD-10-CM | POA: Diagnosis not present

## 2020-05-23 DIAGNOSIS — H401233 Low-tension glaucoma, bilateral, severe stage: Secondary | ICD-10-CM | POA: Diagnosis not present

## 2020-05-23 DIAGNOSIS — H47013 Ischemic optic neuropathy, bilateral: Secondary | ICD-10-CM | POA: Diagnosis not present

## 2020-06-02 ENCOUNTER — Other Ambulatory Visit: Payer: Self-pay | Admitting: Family Medicine

## 2020-06-14 ENCOUNTER — Ambulatory Visit (INDEPENDENT_AMBULATORY_CARE_PROVIDER_SITE_OTHER): Payer: Medicare Other

## 2020-06-14 ENCOUNTER — Ambulatory Visit (INDEPENDENT_AMBULATORY_CARE_PROVIDER_SITE_OTHER): Payer: Medicare Other | Admitting: Family Medicine

## 2020-06-14 ENCOUNTER — Other Ambulatory Visit: Payer: Self-pay

## 2020-06-14 ENCOUNTER — Encounter: Payer: Self-pay | Admitting: Family Medicine

## 2020-06-14 ENCOUNTER — Telehealth: Payer: Self-pay | Admitting: Family Medicine

## 2020-06-14 VITALS — BP 150/71 | HR 63 | Temp 97.4°F | Ht 62.0 in | Wt 157.4 lb

## 2020-06-14 DIAGNOSIS — I10 Essential (primary) hypertension: Secondary | ICD-10-CM | POA: Diagnosis not present

## 2020-06-14 DIAGNOSIS — R238 Other skin changes: Secondary | ICD-10-CM

## 2020-06-14 DIAGNOSIS — R6889 Other general symptoms and signs: Secondary | ICD-10-CM | POA: Diagnosis not present

## 2020-06-14 DIAGNOSIS — F330 Major depressive disorder, recurrent, mild: Secondary | ICD-10-CM

## 2020-06-14 DIAGNOSIS — N1832 Chronic kidney disease, stage 3b: Secondary | ICD-10-CM

## 2020-06-14 DIAGNOSIS — I480 Paroxysmal atrial fibrillation: Secondary | ICD-10-CM

## 2020-06-14 DIAGNOSIS — R0781 Pleurodynia: Secondary | ICD-10-CM

## 2020-06-14 DIAGNOSIS — I700 Atherosclerosis of aorta: Secondary | ICD-10-CM | POA: Diagnosis not present

## 2020-06-14 DIAGNOSIS — R233 Spontaneous ecchymoses: Secondary | ICD-10-CM

## 2020-06-14 DIAGNOSIS — S299XXA Unspecified injury of thorax, initial encounter: Secondary | ICD-10-CM | POA: Diagnosis not present

## 2020-06-14 MED ORDER — SERTRALINE HCL 100 MG PO TABS: 100.0000 mg | ORAL_TABLET | Freq: Every day | ORAL | 3 refills | Status: DC

## 2020-06-14 NOTE — Progress Notes (Signed)
Subjective: CC: Afib, HTN, Panic disorder PCP: [name], DO HPI:Kristy [name] is a 86 y.o. female presenting to clinic today for:  1.  Thrombocytosis/atrial fibrillation Patient has moved her care back to [place].  She last saw [name].  She has stopped taking the Hydrea as she felt that this is breaking her skin out.  She continues to take Xarelto as prescribed.  Denies any hematochezia, melena, vaginal bleeding or epistaxis.  2.  Panic disorder /depression Patient feels that depressive symptoms seem to be getting worse and she would like to increase her dose of Zoloft to 100 mg daily.  She still rarely uses the alprazolam.  3.  Rib pain Patient reports she sustained a fall on June 19.  Unfortunately she was not able to be seen in the clinic.  She notes she has ongoing left-sided rib pain that seems to be worse with coughing or sneezing.  She is also still has a bruise on her right arm.  She has been using the Tylenol with some improvement but she wonders if she broke her rib  4.  Hypertension Patient reports compliance with hydrochlorothiazide and Toprol.  She has a lower extremity edema that is worse at night but improves during the morning.  She does use low-grade compression hose.     ROS: Per HPI  Allergies  Allergen Reactions  . Codeine Nausea Only  . Latanoprost Other (See Comments)    Red and burning  . Tramadol Nausea Only   Past Medical History:  Diagnosis Date  . A-fib ([place])   . Cataract   . Closed left arm fracture 2016  . Colon polyps   . Diverticulosis   . Endometrial hyperplasia without atypia, simple   . Endometrial polyp   . Fatty liver   . GERD (gastroesophageal reflux disease)   . Hiatal hernia   . Hyperplastic colon polyp   . Hypertension   . Lipoma   . Obesity   . Optic neuropathy of both eyes 1989, 1997  . SOB (shortness of breath)   . Thrombocytosis ([place])     Current Outpatient Medications:  .  ALPRAZolam (XANAX) 0.25  MG tablet, Take 1 tablet (0.25 mg total) by mouth daily as needed for anxiety. (Patient not taking: Reported on 04/03/2020), Disp: 90 tablet, Rfl: 0 .  Cholecalciferol (VITAMIN D3) 2000 UNITS TABS, Take 2,000 Units by mouth daily. , Disp: , Rfl:  .  glucose blood (ONE TOUCH ULTRA TEST) test strip, CHECK BLOOD SUGAR ONCE DAILY, Disp: 100 each, Rfl: 2 .  hydrochlorothiazide (HYDRODIURIL) 25 MG tablet, TAKE 1 TABLET BY MOUTH  DAILY, Disp: 90 tablet, Rfl: 1 .  hydroxyurea (HYDREA) 500 MG capsule, Take 1 capsule (500 mg total) by mouth daily. Take one tablet by mouth two times a week, Disp: 30 capsule, Rfl: 3 .  metoprolol succinate (TOPROL-XL) 25 MG 24 hr tablet, TAKE 1 TABLET BY MOUTH  DAILY, Disp: 90 tablet, Rfl: 1 .  Multiple Vitamins-Minerals (EYE VITAMINS PO), Take 1 tablet by mouth daily., Disp: , Rfl:  .  omeprazole (PRILOSEC) 20 MG capsule, TAKE 1 CAPSULE BY MOUTH  DAILY (Needs to be seen before next refill), Disp: 90 capsule, Rfl: 0 .  Polyethyl Glycol-Propyl Glycol (SYSTANE OP), Apply 1-2 drops to eye 4 (four) times daily as needed (dry eyes)., Disp: , Rfl:  .  sertraline (ZOLOFT) 50 MG tablet, Take 1.5 tablets (75 mg total) by mouth daily., Disp: 135 tablet, Rfl: 1 .  timolol (  TIMOPTIC) 0.5 % ophthalmic solution, Place 1 drop into both eyes daily. , Disp: , Rfl:  .  XARELTO 15 MG TABS tablet, TAKE 1 TABLET BY MOUTH  DAILY, Disp: 90 tablet, Rfl: 0 Social History   Socioeconomic History  . Marital status: Widowed    Spouse name: Not on file  . Number of children: Not on file  . Years of education: Not on file  . Highest education level: Not on file  Occupational History  . 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 Smoking status: Former Smoker    Quit date: 12/07/1982    Years since quitting: 37.5  . Smokeless tobacco: Never Used  Vaping Use  . Vaping Use: Never used  Substance and Sexual Activity  . Alcohol use: No    . Drug use: No  . Sexual activity: Never  Other Topics Concern  . Not on file  Social History Narrative  . Not on file   Social Determinants of Health   Financial Resource Strain:   . Difficulty of Paying Living Expenses:   Food Insecurity:   . Worried About Charity fundraiser in the Last Year:   . Arboriculturist in the Last Year:   Transportation Needs:   . Film/video editor (Medical):   Marland Kitchen Lack of Transportation (Non-Medical):   Physical Activity:   . Days of Exercise per Week:   . Minutes of Exercise per Session:   Stress:   . Feeling of Stress :   Social Connections:   . Frequency of Communication with Friends and Family:   . Frequency of Social Gatherings with Friends and Family:   . Attends Religious Services:   . Active Member of Clubs or Organizations:   . Attends Archivist Meetings:   Marland Kitchen Marital Status:   Intimate Partner Violence:   . Fear of Current or Ex-Partner:   . Emotionally Abused:   Marland Kitchen Physically Abused:   . Sexually Abused:    Family History  Problem Relation Age of Onset  . Diabetes Mother   . Diabetes Sister   . Stroke Father   . Diabetes Brother   . Heart disease Brother   . Cancer Sister        lung  . Cancer Sister        vulva  . Cancer Sister   . Dementia Sister   . Osteoporosis Sister   . Diabetes Sister   . Osteoporosis Sister   . Diabetes Sister   . Cancer Brother        bladder  . Colon cancer Neg Hx     Objective: Office vital signs reviewed. BP (!) 150/71   Pulse 63   Temp (!) 97.4 F (36.3 C)   Ht _0  (1.575 m)   Wt 157 lb 6.4 oz (71.4 kg)   SpO2 95%   BMI 28.79 kg/m   Physical Examination:  General: Awake, alert, well nourished, No acute distress HEENT: Normal, sclera white, MMM Cardio: irregularly irregular, S1S2 heard, no murmurs appreciated Pulm: clear to auscultation bilaterally, no wheezes, rhonchi or rales; normal work of breathing on room air Extremities: warm, well perfused, pitting  lower extremity edema present bilaterally. no cyanosis or clubbing; +2 pulses bilaterally Skin: Healing ecchymosis noted along the right extensor surface of the elbow. Psych: Mood stable, speech normal, effort appropriate, pleasant and interactive. Depression screen [facility] 2/9 03/11/2020 11/24/2019 08/23/2019  Decreased Interest 2 3 0  Down, Depressed, Hopeless 2 3 0  PHQ - 2 Score 4 6 0  Altered sleeping 1 0 -  Tired, decreased energy 2 3 -  Change in appetite 1 0 -  Feeling bad or failure about yourself  0 0 -  Trouble concentrating 1 1 -  Moving slowly or fidgety/restless 0 0 -  Suicidal thoughts 0 0 -  PHQ-9 Score 9 10 -  Difficult doing work/chores Somewhat difficult Somewhat difficult -  Some recent data might be hidden   GAD 7 : Generalized Anxiety Score 03/11/2020  Nervous, Anxious, on Edge 1  Control/stop worrying 1  Worry too much - different things 1  Trouble relaxing 0  Restless 0  Easily annoyed or irritable 0  Afraid - awful might happen 0  Total GAD 7 Score 3   No results found.   Assessment/ Plan: 86 y.o. female   1. PAF (paroxysmal atrial fibrillation) (HCC) Rate controlled.  Continue current regimen - CBC with Differential - CMP14+EGFR - Vitamin B12  2. Thoracic aortic atherosclerosis (HCC) - TSH - Vitamin B12  3. Essential hypertension Borderline  4. Rib pain on left side Chest x-ray/rib x-ray obtained.  No obvious fractures noted on personal review.  Formal read by radiology pending - DG Ribs Unilateral W/Chest Left; Future  5. Easy bruisability Check CBC and platelet, particularly given discontinuation of hydroxyurea - CBC with Differential - Vitamin B12  6. Mild episode of recurrent major depressive disorder (HCC) Increase Zoloft 100 mg daily - Vitamin B12 - sertraline (ZOLOFT) 100 MG tablet; Take 1 tablet (100 mg total) by mouth daily.  Dispense: 90 tablet; Refill: 3  7. Stage 3b chronic kidney disease - CBC with Differential - Ferritin -  Vitamin B12 - VITAMIN D 25 Hydroxy (Vit-D Deficiency, Fractures)   No orders of the defined types were placed in this encounter.  No orders of the defined types were placed in this encounter.    [name], DO [place] [phone]

## 2020-06-14 NOTE — Patient Instructions (Addendum)
You had labs performed today.  You will be contacted with the results of the labs once they are available, usually in the next 3 business days for routine lab work.  If you have an active my chart account, they will be released to your MyChart.  If you prefer to have these labs released to you via telephone, please let us know.  If you had a pap smear or biopsy performed, expect to be contacted in about 7-10 days.  Increase Zoloft to 100mg  daily.  New rx sent  Edema  Edema is when you have too much fluid in your body or under your skin. Edema may make your legs, feet, and ankles swell up. Swelling is also common in looser tissues, like around your eyes. This is a common condition. It gets more common as you get older. There are many possible causes of edema. Eating too much salt (sodium) and being on your feet or sitting for a long time can cause edema in your legs, feet, and ankles. Hot weather may make edema worse. Edema is usually painless. Your skin may look swollen or shiny. Follow these instructions at home:  Keep the swollen body part raised (elevated) above the level of your heart when you are sitting or lying down.  Do not sit still or stand for a long time.  Do not wear tight clothes. Do not wear garters on your upper legs.  Exercise your legs. This can help the swelling go down.  Wear elastic bandages or support stockings as told by your doctor.  Eat a low-salt (low-sodium) diet to reduce fluid as told by your doctor.  Depending on the cause of your swelling, you may need to limit how much fluid you drink (fluid restriction).  Take over-the-counter and prescription medicines only as told by your doctor. Contact a doctor if:  Treatment is not working.  You have heart, liver, or kidney disease and have symptoms of edema.  You have sudden and unexplained weight gain. Get help right away if:  You have shortness of breath or chest pain.  You cannot breathe when you lie  down.  You have pain, redness, or warmth in the swollen areas.  You have heart, liver, or kidney disease and get edema all of a sudden.  You have a fever and your symptoms get worse all of a sudden. Summary  Edema is when you have too much fluid in your body or under your skin.  Edema may make your legs, feet, and ankles swell up. Swelling is also common in looser tissues, like around your eyes.  Raise (elevate) the swollen body part above the level of your heart when you are sitting or lying down.  Follow your doctor's instructions about diet and how much fluid you can drink (fluid restriction). This information is not intended to replace advice given to you by your health care provider. Make sure you discuss any questions you have with your health care provider. Document Revised: 11/26/2017 Document Reviewed: 12/11/2016 Elsevier Patient Education  2020 [employer].

## 2020-06-14 NOTE — Telephone Encounter (Signed)
Aware, x-ray is in process.

## 2020-06-15 LAB — CBC WITH DIFFERENTIAL/PLATELET
Basophils Absolute: 0.1 10*3/uL (ref 0.0–0.2)
Basos: 1 %
EOS (ABSOLUTE): 0.1 10*3/uL (ref 0.0–0.4)
Eos: 1 %
Hematocrit: 39 % (ref 34.0–46.6)
Hemoglobin: 13.3 g/dL (ref 11.1–15.9)
Immature Grans (Abs): 0 10*3/uL (ref 0.0–0.1)
Immature Granulocytes: 0 %
Lymphocytes Absolute: 1.4 10*3/uL (ref 0.7–3.1)
Lymphs: 20 %
MCH: 31.1 pg (ref 26.6–33.0)
MCHC: 34.1 g/dL (ref 31.5–35.7)
MCV: 91 fL (ref 79–97)
Monocytes Absolute: 0.6 10*3/uL (ref 0.1–0.9)
Monocytes: 9 %
Neutrophils Absolute: 4.7 10*3/uL (ref 1.4–7.0)
Neutrophils: 69 %
Platelets: 554 10*3/uL — ABNORMAL HIGH (ref 150–450)
RBC: 4.28 x10E6/uL (ref 3.77–5.28)
RDW: 13.4 % (ref 11.7–15.4)
WBC: 6.8 10*3/uL (ref 3.4–10.8)

## 2020-06-15 LAB — CMP14+EGFR
ALT: 13 IU/L (ref 0–32)
AST: 25 IU/L (ref 0–40)
Albumin/Globulin Ratio: 2.5 — ABNORMAL HIGH (ref 1.2–2.2)
Albumin: 4.7 g/dL — ABNORMAL HIGH (ref 3.6–4.6)
Alkaline Phosphatase: 80 IU/L (ref 48–121)
BUN/Creatinine Ratio: 16 (ref 12–28)
BUN: 19 mg/dL (ref 8–27)
Bilirubin Total: 0.5 mg/dL (ref 0.0–1.2)
CO2: 25 mmol/L (ref 20–29)
Calcium: 9.8 mg/dL (ref 8.7–10.3)
Chloride: 102 mmol/L (ref 96–106)
Creatinine, Ser: 1.22 mg/dL — ABNORMAL HIGH (ref 0.57–1.00)
GFR calc Af Amer: 45 mL/min/{1.73_m2} — ABNORMAL LOW (ref >59)
GFR calc non Af Amer: 39 mL/min/{1.73_m2} — ABNORMAL LOW (ref >59)
Globulin, Total: 1.9 g/dL (ref 1.5–4.5)
Glucose: 108 mg/dL — ABNORMAL HIGH (ref 65–99)
Potassium: 4.4 mmol/L (ref 3.5–5.2)
Sodium: 141 mmol/L (ref 134–144)
Total Protein: 6.6 g/dL (ref 6.0–8.5)

## 2020-06-15 LAB — FERRITIN: Ferritin: 68 ng/mL (ref 15–150)

## 2020-06-15 LAB — VITAMIN B12: Vitamin B-12: 1893 pg/mL — ABNORMAL HIGH (ref 232–1245)

## 2020-06-15 LAB — VITAMIN D 25 HYDROXY (VIT D DEFICIENCY, FRACTURES): Vit D, 25-Hydroxy: 56.7 ng/mL (ref 30.0–100.0)

## 2020-06-15 LAB — TSH: TSH: 2.84 u[IU]/mL (ref 0.450–4.500)

## 2020-06-20 ENCOUNTER — Telehealth: Payer: Self-pay | Admitting: Family Medicine

## 2020-06-20 NOTE — Telephone Encounter (Signed)
Reviewed results with pt and she voiced understanding.

## 2020-06-24 DIAGNOSIS — Z85828 Personal history of other malignant neoplasm of skin: Secondary | ICD-10-CM | POA: Diagnosis not present

## 2020-06-24 DIAGNOSIS — D1801 Hemangioma of skin and subcutaneous tissue: Secondary | ICD-10-CM | POA: Diagnosis not present

## 2020-06-24 DIAGNOSIS — L821 Other seborrheic keratosis: Secondary | ICD-10-CM | POA: Diagnosis not present

## 2020-06-24 DIAGNOSIS — L720 Epidermal cyst: Secondary | ICD-10-CM | POA: Diagnosis not present

## 2020-06-24 DIAGNOSIS — C44719 Basal cell carcinoma of skin of left lower limb, including hip: Secondary | ICD-10-CM | POA: Diagnosis not present

## 2020-06-24 DIAGNOSIS — L814 Other melanin hyperpigmentation: Secondary | ICD-10-CM | POA: Diagnosis not present

## 2020-07-04 DIAGNOSIS — H401233 Low-tension glaucoma, bilateral, severe stage: Secondary | ICD-10-CM | POA: Diagnosis not present

## 2020-07-09 ENCOUNTER — Ambulatory Visit: Payer: Medicare Other | Admitting: Family Medicine

## 2020-07-16 DIAGNOSIS — H401233 Low-tension glaucoma, bilateral, severe stage: Secondary | ICD-10-CM | POA: Diagnosis not present

## 2020-07-25 ENCOUNTER — Other Ambulatory Visit: Payer: Self-pay | Admitting: Family Medicine

## 2020-08-22 ENCOUNTER — Encounter: Payer: Self-pay | Admitting: Nurse Practitioner

## 2020-08-22 ENCOUNTER — Ambulatory Visit (INDEPENDENT_AMBULATORY_CARE_PROVIDER_SITE_OTHER): Payer: Medicare Other | Admitting: Nurse Practitioner

## 2020-08-22 ENCOUNTER — Other Ambulatory Visit: Payer: Self-pay

## 2020-08-22 VITALS — BP 162/71 | HR 73 | Temp 97.6°F | Ht 62.0 in | Wt 155.8 lb

## 2020-08-22 DIAGNOSIS — Z711 Person with feared health complaint in whom no diagnosis is made: Secondary | ICD-10-CM

## 2020-08-22 NOTE — Assessment & Plan Note (Signed)
Patient is a 86 year old female who presents to clinic with feared complaint without diagnosis.  Patient reports that she found two bumps on her head, and believes one side of her head is bigger than the other.  Patient denies pain discomfort, fever chills, redness or warmth on bump sites.  After assessment patient's head is even on both sides.  Provided reassurance. Advised patient to follow-up with any new signs or symptoms.

## 2020-08-22 NOTE — Progress Notes (Signed)
Acute Office Visit  Subjective:    Patient ID: INDA MCGLOTHEN, female    DOB: 1928/10/03, 86 y.o.   MRN: [id number]  Chief Complaint  Patient presents with   Cyst    on head    HPI Patient is a 86 year old female who presents to clinic today for bumps on her head. Patient report she discovered her head is not even and decided to get it checked. Patient has not had a fall or bumped head into any object. Patient denies headache, pain or fevers. Patient wants to make sure uneven scalp is normal.   Past Medical History:  Diagnosis Date   A-fib [facility])    Cataract    Closed left arm fracture 2016   Colon polyps    Diverticulosis    Endometrial hyperplasia without atypia, simple    Endometrial polyp    Fatty liver    GERD (gastroesophageal reflux disease)    Hiatal hernia    Hyperplastic colon polyp    Hypertension    Lipoma    Obesity    Optic neuropathy of both eyes 1989, 1997   SOB (shortness of breath)    Thrombocytosis (HCC)     Past Surgical History:  Procedure Laterality Date   breast biopsy     CATARACT EXTRACTION     CHOLECYSTECTOMY  03/2002   DILATION AND CURETTAGE OF UTERUS     x 2   EYE SURGERY     HERNIA REPAIR     KNEE SURGERY     optic neuritis     umbilical hernia repair  09/2002    Family History  Problem Relation Age of Onset   Diabetes Mother    Diabetes Sister    Stroke Father    Diabetes Brother    Heart disease Brother    Cancer Sister        lung   Cancer Sister        vulva   Cancer Sister    Dementia Sister    Osteoporosis Sister    Diabetes Sister    Osteoporosis Sister    Diabetes Sister    Cancer Brother        bladder   Colon cancer Neg Hx     Social History   Socioeconomic History   Marital status: Widowed    Spouse name: Not on file   Number of children: Not on file   Years of education: Not on file   Highest education level: Not on file  Occupational History    Occupation: [employer]: RETIRED    Comment: Retired  Tobacco Use   Smoking status: Former Smoker    Quit date: 12/07/1982    Years since quitting: 37.7   Smokeless tobacco: Never Used  Vaping Use   Vaping Use: Never used  Substance and Sexual Activity   Alcohol use: No   Drug use: No   Sexual activity: Never  Other Topics Concern   Not on file  Social History Narrative   Not on file      Outpatient Medications Prior to Visit  Medication Sig Dispense Refill   ALPRAZolam (XANAX) 0.25 MG tablet Take 1 tablet (0.25 mg total) by mouth daily as needed for anxiety. 90 tablet 0   Cholecalciferol (VITAMIN D3) 2000 UNITS TABS Take 2,000 Units by mouth daily.      glucose blood (ONE TOUCH ULTRA TEST) test strip CHECK BLOOD SUGAR ONCE DAILY 100 each 2  hydrochlorothiazide (HYDRODIURIL) 25 MG tablet TAKE 1 TABLET BY MOUTH  DAILY 90 tablet 1   hydroxyurea (HYDREA) 500 MG capsule Take 1 capsule (500 mg total) by mouth daily. Take one tablet by mouth two times a week 30 capsule 3   metoprolol succinate (TOPROL-XL) 25 MG 24 hr tablet TAKE 1 TABLET BY MOUTH  DAILY 90 tablet 1   Multiple Vitamins-Minerals (EYE VITAMINS PO) Take 1 tablet by mouth daily.     omeprazole (PRILOSEC) 20 MG capsule TAKE 1 CAPSULE BY MOUTH  DAILY 90 capsule 1   Polyethyl Glycol-Propyl Glycol (SYSTANE OP) Apply 1-2 drops to eye 4 (four) times daily as needed (dry eyes).     sertraline (ZOLOFT) 100 MG tablet Take 1 tablet (100 mg total) by mouth daily. 90 tablet 3   timolol (TIMOPTIC) 0.5 % ophthalmic solution Place 1 drop into both eyes daily.      XARELTO 15 MG TABS tablet TAKE 1 TABLET BY MOUTH  DAILY 90 tablet 0   No facility-administered medications prior to visit.    Allergies  Allergen Reactions   Codeine Nausea Only   Latanoprost Other (See Comments)    Red and burning   Tramadol Nausea Only    Review of Systems  HENT:       Head is evenly  rounded no abnormalities noted.  Neurological: Negative for headaches.  All other systems reviewed and are negative.      Objective:    Physical Exam Vitals reviewed.  Constitutional:      Appearance: Normal appearance.  HENT:     Head: Normocephalic.     Nose: Nose normal.  Cardiovascular:     Pulses: Normal pulses.  Pulmonary:     Effort: Pulmonary effort is normal.     Breath sounds: Normal breath sounds.  Musculoskeletal:        General: Normal range of motion.     Cervical back: Neck supple.  Neurological:     Mental Status: She is alert and oriented to person, place, and time.     BP (!) 162/71    Pulse 73    Temp 97.6 F (36.4 C)    Ht 5\' 2"  (1.575 m)    Wt 155 lb 12.8 oz (70.7 kg)    SpO2 95%    BMI 28.50 kg/m  Wt Readings from Last 3 Encounters:  08/22/20 155 lb 12.8 oz (70.7 kg)  06/14/20 157 lb 6.4 oz (71.4 kg)  04/03/20 157 lb (71.2 kg)    Health Maintenance Due  Topic Date Due   COVID-19 Vaccine (1) Never done   TETANUS/TDAP  03/06/2020   INFLUENZA VACCINE  07/07/2020    There are no preventive care reminders to display for this patient.   Lab Results  Component Value Date   TSH 2.840 06/14/2020   Lab Results  Component Value Date   WBC 6.8 06/14/2020   HGB 13.3 06/14/2020   HCT 39.0 06/14/2020   MCV 91 06/14/2020   PLT 554 (H) 06/14/2020   Lab Results  Component Value Date   NA 141 06/14/2020   K 4.4 06/14/2020   CO2 25 06/14/2020   GLUCOSE 108 (H) 06/14/2020   BUN 19 06/14/2020   CREATININE 1.22 (H) 06/14/2020   BILITOT 0.5 06/14/2020   ALKPHOS 80 06/14/2020   AST 25 06/14/2020   ALT 13 06/14/2020   PROT 6.6 06/14/2020   ALBUMIN 4.7 (H) 06/14/2020   CALCIUM 9.8 06/14/2020   ANIONGAP 12 04/03/2020  Lab Results  Component Value Date   CHOL 166 02/08/2019   Lab Results  Component Value Date   HDL 53 02/08/2019   Lab Results  Component Value Date   LDLCALC 90 02/08/2019   Lab Results  Component Value Date   TRIG  113 02/08/2019   Lab Results  Component Value Date   CHOLHDL 3.1 02/08/2019   Lab Results  Component Value Date   HGBA1C 5.5 03/14/2018       Assessment & Plan:   Problem List Items Addressed This Visit      Other   Feared complaint without diagnosis - Primary    Patient is a 86 year old female who presents to clinic with feared complaint without diagnosis.  Patient reports that she found two bumps on her head, and believes one side of her head is bigger than the other.  Patient denies pain discomfort, fever chills, redness or warmth on bump sites.  After assessment patient's head is even on both sides.  Provided reassurance. Advised patient to follow-up with any new signs or symptoms.          No orders of the defined types were placed in this encounter.    Ivy Lynn, NP

## 2020-08-22 NOTE — Patient Instructions (Signed)

## 2020-08-23 ENCOUNTER — Other Ambulatory Visit: Payer: Self-pay | Admitting: Family Medicine

## 2020-09-02 ENCOUNTER — Telehealth (HOSPITAL_COMMUNITY): Payer: Self-pay

## 2020-09-02 NOTE — Telephone Encounter (Signed)
Patient called concerning her hydrea with upcoming dental visit on September 10, 2020 for a crown, root canal, tooth extraction, and receeding gums.  Reviewed the patients concerns with the oncologist with verbal order Ok to take the hydrea with dental appointment Dr. Delton Coombes.  Called the patient and reviewed instructions with understanding verbalized.

## 2020-09-25 ENCOUNTER — Other Ambulatory Visit (HOSPITAL_COMMUNITY): Payer: Medicare Other

## 2020-09-30 ENCOUNTER — Other Ambulatory Visit: Payer: Self-pay

## 2020-09-30 ENCOUNTER — Other Ambulatory Visit: Payer: Medicare Other

## 2020-09-30 DIAGNOSIS — Z7689 Persons encountering health services in other specified circumstances: Secondary | ICD-10-CM | POA: Diagnosis not present

## 2020-09-30 DIAGNOSIS — Z1589 Genetic susceptibility to other disease: Secondary | ICD-10-CM

## 2020-09-30 LAB — CBC WITH DIFFERENTIAL/PLATELET
Basophils Absolute: 0.1 10*3/uL (ref 0.0–0.2)
Basos: 1 %
EOS (ABSOLUTE): 0.1 10*3/uL (ref 0.0–0.4)
Eos: 1 %
Hematocrit: 40.9 % (ref 34.0–46.6)
Hemoglobin: 14 g/dL (ref 11.1–15.9)
Immature Grans (Abs): 0 10*3/uL (ref 0.0–0.1)
Immature Granulocytes: 0 %
Lymphocytes Absolute: 1.3 10*3/uL (ref 0.7–3.1)
Lymphs: 20 %
MCH: 30 pg (ref 26.6–33.0)
MCHC: 34.2 g/dL (ref 31.5–35.7)
MCV: 88 fL (ref 79–97)
Monocytes Absolute: 0.7 10*3/uL (ref 0.1–0.9)
Monocytes: 11 %
Neutrophils Absolute: 4.7 10*3/uL (ref 1.4–7.0)
Neutrophils: 67 %
Platelets: 462 10*3/uL — ABNORMAL HIGH (ref 150–450)
RBC: 4.66 x10E6/uL (ref 3.77–5.28)
RDW: 14.4 % (ref 11.7–15.4)
WBC: 6.8 10*3/uL (ref 3.4–10.8)

## 2020-09-30 NOTE — Addendum Note (Signed)
Addended by: Earlene Plater on: 09/30/2020 10:33 AM   Modules accepted: Orders

## 2020-09-30 NOTE — Addendum Note (Signed)
Addended by: Earlene Plater on: 09/30/2020 10:34 AM   Modules accepted: Orders

## 2020-10-01 LAB — COMPREHENSIVE METABOLIC PANEL
ALT: 12 IU/L (ref 0–32)
AST: 18 IU/L (ref 0–40)
Albumin/Globulin Ratio: 2 (ref 1.2–2.2)
Albumin: 4.4 g/dL (ref 3.6–4.6)
Alkaline Phosphatase: 75 IU/L (ref 44–121)
BUN/Creatinine Ratio: 13 (ref 12–28)
BUN: 18 mg/dL (ref 8–27)
Bilirubin Total: 0.5 mg/dL (ref 0.0–1.2)
CO2: 23 mmol/L (ref 20–29)
Calcium: 9.6 mg/dL (ref 8.7–10.3)
Chloride: 105 mmol/L (ref 96–106)
Creatinine, Ser: 1.34 mg/dL — ABNORMAL HIGH (ref 0.57–1.00)
GFR calc Af Amer: 41 mL/min/{1.73_m2} — ABNORMAL LOW (ref >59)
GFR calc non Af Amer: 35 mL/min/{1.73_m2} — ABNORMAL LOW (ref >59)
Globulin, Total: 2.2 g/dL (ref 1.5–4.5)
Glucose: 109 mg/dL — ABNORMAL HIGH (ref 65–99)
Potassium: 4.4 mmol/L (ref 3.5–5.2)
Sodium: 144 mmol/L (ref 134–144)
Total Protein: 6.6 g/dL (ref 6.0–8.5)

## 2020-10-01 LAB — LACTATE DEHYDROGENASE: LDH: 263 IU/L — ABNORMAL HIGH (ref 119–226)

## 2020-10-02 ENCOUNTER — Telehealth: Payer: Self-pay

## 2020-10-02 ENCOUNTER — Other Ambulatory Visit (HOSPITAL_COMMUNITY): Payer: Self-pay | Admitting: *Deleted

## 2020-10-02 ENCOUNTER — Other Ambulatory Visit: Payer: Self-pay

## 2020-10-02 ENCOUNTER — Inpatient Hospital Stay (HOSPITAL_COMMUNITY): Payer: Medicare Other | Attending: Hematology | Admitting: Hematology

## 2020-10-02 VITALS — BP 174/77 | HR 72 | Temp 97.0°F | Resp 18 | Wt 156.3 lb

## 2020-10-02 DIAGNOSIS — D473 Essential (hemorrhagic) thrombocythemia: Secondary | ICD-10-CM | POA: Diagnosis not present

## 2020-10-02 DIAGNOSIS — Z79899 Other long term (current) drug therapy: Secondary | ICD-10-CM | POA: Insufficient documentation

## 2020-10-02 DIAGNOSIS — I4891 Unspecified atrial fibrillation: Secondary | ICD-10-CM | POA: Diagnosis not present

## 2020-10-02 DIAGNOSIS — M549 Dorsalgia, unspecified: Secondary | ICD-10-CM | POA: Insufficient documentation

## 2020-10-02 DIAGNOSIS — I10 Essential (primary) hypertension: Secondary | ICD-10-CM | POA: Insufficient documentation

## 2020-10-02 DIAGNOSIS — E669 Obesity, unspecified: Secondary | ICD-10-CM | POA: Diagnosis not present

## 2020-10-02 DIAGNOSIS — R42 Dizziness and giddiness: Secondary | ICD-10-CM | POA: Insufficient documentation

## 2020-10-02 DIAGNOSIS — Z1589 Genetic susceptibility to other disease: Secondary | ICD-10-CM

## 2020-10-02 DIAGNOSIS — G8929 Other chronic pain: Secondary | ICD-10-CM | POA: Diagnosis not present

## 2020-10-02 DIAGNOSIS — Z7901 Long term (current) use of anticoagulants: Secondary | ICD-10-CM | POA: Insufficient documentation

## 2020-10-02 DIAGNOSIS — C946 Myelodysplastic disease, not classified: Secondary | ICD-10-CM | POA: Insufficient documentation

## 2020-10-02 DIAGNOSIS — Z8719 Personal history of other diseases of the digestive system: Secondary | ICD-10-CM | POA: Diagnosis not present

## 2020-10-02 MED ORDER — HYDROXYUREA 500 MG PO CAPS: ORAL_CAPSULE | ORAL | 6 refills | Status: DC

## 2020-10-02 NOTE — Telephone Encounter (Signed)
Please have patient come in for BP check.

## 2020-10-02 NOTE — Progress Notes (Signed)
[place] [place], [place] [address]   CLINIC:  Medical Oncology/Hematology  PCP:  [name], DO [place] [id number] [phone]   REASON FOR VISIT:  Follow-up for myeloproliferative disorder.   CURRENT THERAPY: Hydroxyurea.  INTERVAL HISTORY:  [name] 86 y.o. female seen for follow-up of myeloproliferative disorder.  Reports taking hydroxyurea 1 tablet on Monday and 1 tablet on Thursday.  Appetite is 75%.  Energy levels are 70%.  Has chronic back pain which is rated as 7 out of 10.  Occasional dizziness which is also unchanged.  Denies any major bleeding episodes.  Denies any aquagenic pruritus.  Denies any change in colors of her fingertips.    REVIEW OF SYSTEMS:  Review of Systems  Neurological: Positive for dizziness.  All other systems reviewed and are negative.    PAST MEDICAL/SURGICAL HISTORY:  Past Medical History:  Diagnosis Date  . A-fib ([place])   . Cataract   . Closed left arm fracture 2016  . Colon polyps   . Diverticulosis   . Endometrial hyperplasia without atypia, simple   . Endometrial polyp   . Fatty liver   . GERD (gastroesophageal reflux disease)   . Hiatal hernia   . Hyperplastic colon polyp   . Hypertension   . Lipoma   . Obesity   . Optic neuropathy of both eyes 1989, 1997  . SOB (shortness of breath)   . Thrombocytosis ([place])    Past Surgical History:  Procedure Laterality Date  . breast biopsy    . CATARACT EXTRACTION    . CHOLECYSTECTOMY  03/2002  . DILATION AND CURETTAGE OF UTERUS     x 2  . EYE SURGERY    . HERNIA REPAIR    . KNEE SURGERY    . optic neuritis    . umbilical hernia repair  09/2002     SOCIAL HISTORY:  Social History   Socioeconomic History  . Marital status: Widowed    Spouse name: Not on file  . Number of children: Not on file  . Years of education: Not on file  . Highest education level: Not on file  Occupational History  . Occupation: [employer]  Regulatory affairs officer: RETIRED    Comment: Retired  Tobacco Use  . Smoking status: Former Smoker    Quit date: 12/07/1982    Years since quitting: 37.8  . Smokeless tobacco: Never Used  Vaping Use  . Vaping Use: Never used  Substance and Sexual Activity  . Alcohol use: No  . Drug use: No  . Sexual activity: Never  Other Topics Concern  . Not on file  Social History Narrative  . Not on file   Social Determinants of Health   Financial Resource Strain:   . Difficulty of Paying Living Expenses: Not on file  Food Insecurity:   . Worried About Charity fundraiser in the Last Year: Not on file  . Ran Out of Food in the Last Year: Not on file  Transportation Needs:   . Lack of Transportation (Medical): Not on file  . Lack of Transportation (Non-Medical): Not on file  Physical Activity:   . Days of Exercise per Week: Not on file  . Minutes of Exercise per Session: Not on file  Stress:   . Feeling of Stress : Not on file  Social Connections:   . Frequency of Communication with Friends and Family: Not on file  .  Frequency of Social Gatherings with Friends and Family: Not on file  . Attends Religious Services: Not on file  . Active Member of Clubs or Organizations: Not on file  . Attends Archivist Meetings: Not on file  . Marital Status: Not on file  Intimate Partner Violence:   . Fear of Current or Ex-Partner: Not on file  . Emotionally Abused: Not on file  . Physically Abused: Not on file  . Sexually Abused: Not on file    FAMILY HISTORY:  Family History  Problem Relation Age of Onset  . Diabetes Mother   . Diabetes Sister   . Stroke Father   . Diabetes Brother   . Heart disease Brother   . Cancer Sister        lung  . Cancer Sister        vulva  . Cancer Sister   . Dementia Sister   . Osteoporosis Sister   . Diabetes Sister   . Osteoporosis Sister   . Diabetes Sister   . Cancer Brother        bladder  . Colon cancer Neg Hx      CURRENT MEDICATIONS:  Outpatient Encounter Medications as of 10/02/2020  Medication Sig  . ALPRAZolam (XANAX) 0.25 MG tablet Take 1 tablet (0.25 mg total) by mouth daily as needed for anxiety.  . Cholecalciferol (VITAMIN D3) 2000 UNITS TABS Take 2,000 Units by mouth daily.   . dorzolamide-timolol (COSOPT) 22.3-6.8 MG/ML ophthalmic solution   . glucose blood (ONE TOUCH ULTRA TEST) test strip CHECK BLOOD SUGAR ONCE DAILY  . hydrochlorothiazide (HYDRODIURIL) 25 MG tablet TAKE 1 TABLET BY MOUTH  DAILY  . metoprolol succinate (TOPROL-XL) 25 MG 24 hr tablet TAKE 1 TABLET BY MOUTH  DAILY  . Multiple Vitamins-Minerals (EYE VITAMINS PO) Take 1 tablet by mouth daily.  Marland Kitchen omeprazole (PRILOSEC) 20 MG capsule TAKE 1 CAPSULE BY MOUTH  DAILY  . sertraline (ZOLOFT) 100 MG tablet Take 1 tablet (100 mg total) by mouth daily.  Alveda Reasons 15 MG TABS tablet TAKE 1 TABLET BY MOUTH  DAILY  . [DISCONTINUED] hydroxyurea (HYDREA) 500 MG capsule Take 1 capsule (500 mg total) by mouth daily. Take one tablet by mouth two times a week  . Polyethyl Glycol-Propyl Glycol (SYSTANE OP) Apply 1-2 drops to eye 4 (four) times daily as needed (dry eyes). (Patient not taking: Reported on 10/02/2020)  . [DISCONTINUED] timolol (TIMOPTIC) 0.5 % ophthalmic solution Place 1 drop into both eyes daily.    No facility-administered encounter medications on file as of 10/02/2020.    ALLERGIES:  Allergies  Allergen Reactions  . Codeine Nausea Only  . Latanoprost Other (See Comments)    Red and burning  . Tramadol Nausea Only     PHYSICAL EXAM:  ECOG Performance status: 1  Vitals:   10/02/20 1101  BP: (!) 174/77  Pulse: 72  Resp: 18  Temp: (!) 97 F (36.1 C)  SpO2: 95%   Filed Weights   10/02/20 1101  Weight: 156 lb 4.8 oz (70.9 kg)   Physical Exam Vitals reviewed.  Constitutional:      Appearance: Normal appearance.  Cardiovascular:     Rate and Rhythm: Normal rate and regular rhythm.     Heart sounds: Normal  heart sounds.  Pulmonary:     Effort: Pulmonary effort is normal.     Breath sounds: Normal breath sounds.  Abdominal:     General: There is no distension.     Palpations: Abdomen  is soft. There is no mass.  Musculoskeletal:        General: No swelling.  Skin:    General: Skin is warm.  Neurological:     General: No focal deficit present.     Mental Status: She is alert and oriented to person, place, and time.  Psychiatric:        Mood and Affect: Mood normal.        Behavior: Behavior normal.      LABORATORY DATA:  I have reviewed the labs as listed.  CBC    Component Value Date/Time   WBC 6.8 09/30/2020 1035   WBC 7.7 04/03/2020 0925   RBC 4.66 09/30/2020 1035   RBC 4.33 04/03/2020 0925   HGB 14.0 09/30/2020 1035   HCT 40.9 09/30/2020 1035   PLT 462 (H) 09/30/2020 1035   MCV 88 09/30/2020 1035   MCH 30.0 09/30/2020 1035   MCH 31.2 04/03/2020 0925   MCHC 34.2 09/30/2020 1035   MCHC 32.9 04/03/2020 0925   RDW 14.4 09/30/2020 1035   LYMPHSABS 1.3 09/30/2020 1035   MONOABS 0.8 04/03/2020 0925   EOSABS 0.1 09/30/2020 1035   BASOSABS 0.1 09/30/2020 1035   CMP Latest Ref Rng & Units 09/30/2020 06/14/2020 04/03/2020  Glucose 65 - 99 mg/dL 109(H) 108(H) 126(H)  BUN 8 - 27 mg/dL 18 19 26(H)  Creatinine 0.57 - 1.00 mg/dL 1.34(H) 1.22(H) 1.26(H)  Sodium 134 - 144 mmol/L 144 141 141  Potassium 3.5 - 5.2 mmol/L 4.4 4.4 3.9  Chloride 96 - 106 mmol/L 105 102 103  CO2 20 - 29 mmol/L 23 25 26   Calcium 8.7 - 10.3 mg/dL 9.6 9.8 9.5  Total Protein 6.0 - 8.5 g/dL 6.6 6.6 6.8  Total Bilirubin 0.0 - 1.2 mg/dL 0.5 0.5 0.7  Alkaline Phos 44 - 121 IU/L 75 80 61  AST 0 - 40 IU/L 18 25 20   ALT 0 - 32 IU/L 12 13 13     DIAGNOSTIC IMAGING:  I have independently reviewed the scans and discussed with the patient.  ASSESSMENT & PLAN:  1.  JAK2 positive myeloproliferative disorder: -Hydroxyurea started around December 2016. -Currently taking 1 tablet on Monday and 1 tablet on Thursday.   Physical examination did not reveal any palpable splenomegaly. -Reviewed labs from 09/30/2020.  Platelet count is 462.  Hematocrit is 40.9.  White count is normal. -Recommended increasing hydroxyurea 1 tablet  Monday, Wednesday and Friday. -RTC 3 months with repeat labs.  2.  Atrial fibrillation: -Continue Xarelto.  No bleeding issues reported.     Orders placed this encounter:  Orders Placed This Encounter  Procedures  . CBC with Differential/Platelet  . Comprehensive metabolic panel  . VITAMIN D 25 Hydroxy (Vit-D Deficiency, Fractures)      Derek Jack, MD [place] [phone]

## 2020-10-02 NOTE — Telephone Encounter (Signed)
Pt asked to talk to [facility] or nurse. I asked for her the message and she said the message is for the nurse to call me. Please call back

## 2020-10-02 NOTE — Telephone Encounter (Signed)
Appointment scheduled.

## 2020-10-02 NOTE — Telephone Encounter (Signed)
Patient states that she had an appointment with Dr. Raliegh Ip today at the cancer center and her BP was 174/77.  States that it has been running around that range recently.  States that her BP machine does not work but when she goes places that have a BP machine and checks it it runs around 170's.  Patient would like to know if she needs to increase medication.  Asked for more readings but patient was not able to provide any.

## 2020-10-03 ENCOUNTER — Ambulatory Visit: Payer: Medicare Other

## 2020-10-03 DIAGNOSIS — I10 Essential (primary) hypertension: Secondary | ICD-10-CM

## 2020-10-03 NOTE — Progress Notes (Signed)
Patient here today for blood pressure check.  She was at hematologists office yesterday and blood pressure was 177/74 and is concerned.  Blood pressure here today was 158/68, pulse 84.  Patient is going to monitor blood pressure at home twice daily and will keep a log and advise [place] of readings.  Patient aware you are out of the office until tomorrow.

## 2020-11-14 DIAGNOSIS — H401233 Low-tension glaucoma, bilateral, severe stage: Secondary | ICD-10-CM | POA: Diagnosis not present

## 2020-11-21 ENCOUNTER — Telehealth: Payer: Self-pay

## 2020-11-21 NOTE — Telephone Encounter (Signed)
Pt call - appt made  This is long term issues

## 2020-11-27 ENCOUNTER — Ambulatory Visit: Payer: Medicare Other | Admitting: Family Medicine

## 2021-01-02 ENCOUNTER — Other Ambulatory Visit (HOSPITAL_COMMUNITY): Payer: Medicare Other

## 2021-01-06 ENCOUNTER — Other Ambulatory Visit: Payer: Self-pay | Admitting: Family Medicine

## 2021-01-09 ENCOUNTER — Ambulatory Visit (HOSPITAL_COMMUNITY): Payer: Medicare Other | Admitting: Hematology

## 2021-02-04 ENCOUNTER — Other Ambulatory Visit: Payer: Self-pay

## 2021-02-04 ENCOUNTER — Other Ambulatory Visit: Payer: Medicare Other

## 2021-02-04 DIAGNOSIS — Z1589 Genetic susceptibility to other disease: Secondary | ICD-10-CM

## 2021-02-04 DIAGNOSIS — R6889 Other general symptoms and signs: Secondary | ICD-10-CM | POA: Diagnosis not present

## 2021-02-04 DIAGNOSIS — D473 Essential (hemorrhagic) thrombocythemia: Secondary | ICD-10-CM

## 2021-02-04 NOTE — Addendum Note (Signed)
Addended by: Liliane Bade on: 02/04/2021 10:20 AM   Modules accepted: Orders

## 2021-02-04 NOTE — Addendum Note (Signed)
Addended by: Liliane Bade on: 02/04/2021 10:21 AM   Modules accepted: Orders

## 2021-02-05 LAB — COMPREHENSIVE METABOLIC PANEL
ALT: 16 IU/L (ref 0–32)
AST: 24 IU/L (ref 0–40)
Albumin/Globulin Ratio: 2.2 (ref 1.2–2.2)
Albumin: 4.6 g/dL (ref 3.5–4.6)
Alkaline Phosphatase: 77 IU/L (ref 44–121)
BUN/Creatinine Ratio: 17 (ref 12–28)
BUN: 21 mg/dL (ref 10–36)
Bilirubin Total: 0.5 mg/dL (ref 0.0–1.2)
CO2: 24 mmol/L (ref 20–29)
Calcium: 10 mg/dL (ref 8.7–10.3)
Chloride: 103 mmol/L (ref 96–106)
Creatinine, Ser: 1.23 mg/dL — ABNORMAL HIGH (ref 0.57–1.00)
Globulin, Total: 2.1 g/dL (ref 1.5–4.5)
Glucose: 89 mg/dL (ref 65–99)
Potassium: 4.5 mmol/L (ref 3.5–5.2)
Sodium: 143 mmol/L (ref 134–144)
Total Protein: 6.7 g/dL (ref 6.0–8.5)
eGFR: 42 mL/min/{1.73_m2} — ABNORMAL LOW (ref >59)

## 2021-02-05 LAB — CBC WITH DIFFERENTIAL/PLATELET
Basophils Absolute: 0.1 10*3/uL (ref 0.0–0.2)
Basos: 1 %
EOS (ABSOLUTE): 0.1 10*3/uL (ref 0.0–0.4)
Eos: 1 %
Hematocrit: 39.8 % (ref 34.0–46.6)
Hemoglobin: 13.5 g/dL (ref 11.1–15.9)
Immature Grans (Abs): 0 10*3/uL (ref 0.0–0.1)
Immature Granulocytes: 0 %
Lymphocytes Absolute: 1.3 10*3/uL (ref 0.7–3.1)
Lymphs: 17 %
MCH: 31.3 pg (ref 26.6–33.0)
MCHC: 33.9 g/dL (ref 31.5–35.7)
MCV: 92 fL (ref 79–97)
Monocytes Absolute: 0.8 10*3/uL (ref 0.1–0.9)
Monocytes: 10 %
Neutrophils Absolute: 5.5 10*3/uL (ref 1.4–7.0)
Neutrophils: 71 %
Platelets: 451 10*3/uL — ABNORMAL HIGH (ref 150–450)
RBC: 4.32 x10E6/uL (ref 3.77–5.28)
RDW: 13.2 % (ref 11.7–15.4)
WBC: 7.7 10*3/uL (ref 3.4–10.8)

## 2021-02-05 LAB — VITAMIN D 25 HYDROXY (VIT D DEFICIENCY, FRACTURES): Vit D, 25-Hydroxy: 82 ng/mL (ref 30.0–100.0)

## 2021-02-06 ENCOUNTER — Other Ambulatory Visit (HOSPITAL_COMMUNITY): Payer: Self-pay

## 2021-02-06 ENCOUNTER — Other Ambulatory Visit: Payer: Self-pay

## 2021-02-06 ENCOUNTER — Inpatient Hospital Stay (HOSPITAL_COMMUNITY): Payer: Medicare Other | Attending: Hematology | Admitting: Hematology

## 2021-02-06 VITALS — BP 151/71 | HR 65 | Temp 97.1°F | Resp 20 | Wt 155.9 lb

## 2021-02-06 DIAGNOSIS — R443 Hallucinations, unspecified: Secondary | ICD-10-CM | POA: Diagnosis not present

## 2021-02-06 DIAGNOSIS — Z87891 Personal history of nicotine dependence: Secondary | ICD-10-CM | POA: Insufficient documentation

## 2021-02-06 DIAGNOSIS — Z1589 Genetic susceptibility to other disease: Secondary | ICD-10-CM

## 2021-02-06 DIAGNOSIS — D473 Essential (hemorrhagic) thrombocythemia: Secondary | ICD-10-CM | POA: Diagnosis not present

## 2021-02-06 DIAGNOSIS — D75839 Thrombocytosis, unspecified: Secondary | ICD-10-CM | POA: Diagnosis not present

## 2021-02-06 DIAGNOSIS — Z7901 Long term (current) use of anticoagulants: Secondary | ICD-10-CM | POA: Insufficient documentation

## 2021-02-06 DIAGNOSIS — I4891 Unspecified atrial fibrillation: Secondary | ICD-10-CM | POA: Diagnosis not present

## 2021-02-06 DIAGNOSIS — Z79899 Other long term (current) drug therapy: Secondary | ICD-10-CM | POA: Insufficient documentation

## 2021-02-06 MED ORDER — MECLIZINE HCL 12.5 MG PO TABS: 12.5000 mg | ORAL_TABLET | ORAL | 0 refills | Status: DC | PRN

## 2021-02-06 MED ORDER — HYDROXYUREA 500 MG PO CAPS: ORAL_CAPSULE | ORAL | 6 refills | Status: DC

## 2021-02-06 NOTE — Patient Instructions (Signed)
[place] at [facility] Discharge Instructions  You were seen today by Dr. Delton Coombes MD and Tarri Abernethy, PA-C for your essential thrombocytosis. Due to side effects from your Hydrea, we will decrease your dose to twice weekly - take on Monday and Thursday. Return to clinic in 3 months, with labs to be completed the week before your appointment. If you have any non-healing sore or ulcers on your legs, please call our office, as we may need to change your dose again.  We also discussed your dizziness. This may be due to orthostatic hypotension.  Be sure to drink plenty of water. Stand up slowly. We will write a prescription for Antivert - use only as needed for dizziness and vertigo. Please discuss this further with your primary care doctor for more detailed workup and treatment.   Thank you for choosing [place] at [facility] to provide your oncology and hematology care.  To afford each patient quality time with our provider, please arrive at least 15 minutes before your scheduled appointment time.   If you have a lab appointment with the [facility] please come in thru the Main Entrance and check in at the main information desk.  You need to re-schedule your appointment should you arrive 10 or more minutes late.  We strive to give you quality time with our providers, and arriving late affects you and other patients whose appointments are after yours.  Also, if you no show three or more times for appointments you may be dismissed from the clinic at the providers discretion.     Again, thank you for choosing [facility].  Our hope is that these requests will decrease the amount of time that you wait before being seen by our physicians.       _____________________________________________________________  Should you have questions after your visit to [facility], please contact our office at [phone] and follow  the prompts.  Our office hours are 8:00 a.m. and 4:30 p.m. Monday - Friday.  Please note that voicemails left after 4:00 p.m. may not be returned until the following business day.  We are closed weekends and major holidays.  You do have access to a nurse 24-7, just call the main number to the clinic [phone] and do not press any options, hold on the line and a nurse will answer the phone.    For prescription refill requests, have your pharmacy contact our office and allow 72 hours.    Due to Covid, you will need to wear a mask upon entering the hospital. If you do not have a mask, a mask will be given to you at the Main Entrance upon arrival. For doctor visits, patients may have 1 support person age 25 or older with them. For treatment visits, patients can not have anyone with them due to social distancing guidelines and our immunocompromised population.

## 2021-02-06 NOTE — Progress Notes (Signed)
Kristy [name], [place] [address]   CLINIC:  Medical Oncology/Hematology  PCP:  [name], DO [address] [place] [address]  [phone]  REASON FOR VISIT:  Follow-up for essential thrombocytosis (JAK2+ myeloproliferative disorder)  PRIOR THERAPY: Hydroxyurea  CURRENT THERAPY: Hydroxyurea  INTERVAL HISTORY:  [name], a 87 y.o. female, returns for routine follow-up for her essential thrombocytosis. [name] was last seen on 10/02/2020.  At her last visit, [name] had her hydroxyurea increased from twice weekly to 3 times per week.  She reports that she did not tolerate the dose change, and had new sores on her bilateral lower extremities without inciting injury or event.  She had some skin peeling, and increasingly brittle nails.  Also reports a sore in her mouth 3 months ago, which has now healed.  She reports that she decreased the dose on her own back to hydroxyurea twice weekly, with improvement in symptoms.  No thromboembolic events since her last visit.  Denies aquagenic pruritus, change in colors of her fingertips.  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  Reports chronic fatigue.  Occasional night sweats.  No fever, chills, unintentional weight loss.  Continues to have intermittent dizziness, which sounds to be postural static dizziness that occurs when going from sitting to standing.  Complains of occasional hallucinations (seeing her dead husband after waking up from a nap), which she thinks may be caused by her Zoloft or her Hydrea.  She is on Xarelto for atrial fibrillation, which she is tolerating well.  Denies any major bleeding episodes.   REVIEW OF SYSTEMS:  Review of Systems  Constitutional: Positive for diaphoresis (Occasional night sweats) and fatigue. Negative for appetite change, chills, fever and unexpected weight change.  HENT:   Positive for mouth sores (Related to cavity, now  resolved). Negative for hearing loss, lump/mass and nosebleeds.   Eyes: Positive for eye problems (Progressively worsening vision).  Respiratory: Negative for chest tightness, cough, hemoptysis, shortness of breath and wheezing.   Cardiovascular: Positive for leg swelling (Intermittent dependent edema of ankles). Negative for chest pain and palpitations.  Gastrointestinal: Negative for abdominal distention, abdominal pain, blood in stool, constipation, diarrhea, nausea and vomiting.  Genitourinary: Negative for difficulty urinating.   Musculoskeletal: Positive for back pain (Chronic and stable). Negative for gait problem.  Skin: Positive for wound (Small bilateral leg wounds after increasing Hydrea dose)). Negative for itching and rash.  Neurological: Positive for dizziness (Postural dizziness with standing) and light-headedness. Negative for extremity weakness, gait problem, headaches and numbness.  Hematological: Negative for adenopathy. Does not bruise/bleed easily.  Psychiatric/Behavioral: Negative for confusion and depression. The patient is not nervous/anxious.     PAST MEDICAL/SURGICAL HISTORY:  Past Medical History:  Diagnosis Date  . A-fib ([place])   . Cataract   . Closed left arm fracture 2016  . Colon polyps   . Diverticulosis   . Endometrial hyperplasia without atypia, simple   . Endometrial polyp   . Fatty liver   . GERD (gastroesophageal reflux disease)   . Hiatal hernia   . Hyperplastic colon polyp   . Hypertension   . Lipoma   . Obesity   . Optic neuropathy of both eyes 1989, 1997  . SOB (shortness of breath)   . Thrombocytosis ([place])    Past Surgical History:  Procedure Laterality Date  . breast biopsy    . CATARACT EXTRACTION    . CHOLECYSTECTOMY  03/2002  .  DILATION AND CURETTAGE OF UTERUS     x 2  . EYE SURGERY    . HERNIA REPAIR    . KNEE SURGERY    . optic neuritis    . umbilical hernia repair  09/2002    SOCIAL HISTORY:  Social History    Socioeconomic History  . Marital status: Widowed    Spouse name: Not on file  . Number of children: Not on file  . Years of education: Not on file  . Highest education level: Not on file  Occupational History  . 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 Smoking status: Former Smoker    Quit date: 12/07/1982    Years since quitting: 38.1  . Smokeless tobacco: Never Used  Vaping Use  . Vaping Use: Never used  Substance and Sexual Activity  . Alcohol use: No  . Drug use: No  . Sexual activity: Never  Other Topics Concern  . Not on file  Social History Narrative  . Not on file   Social Determinants of Health   Financial Resource Strain: Not on file  Food Insecurity: Not on file  Transportation Needs: Not on file  Physical Activity: Not on file  Stress: Not on file  Social Connections: Not on file  Intimate Partner Violence: Not on file    FAMILY HISTORY:  Family History  Problem Relation Age of Onset  . Diabetes Mother   . Diabetes Sister   . Stroke Father   . Diabetes Brother   . Heart disease Brother   . Cancer Sister        lung  . Cancer Sister        vulva  . Cancer Sister   . Dementia Sister   . Osteoporosis Sister   . Diabetes Sister   . Osteoporosis Sister   . Diabetes Sister   . Cancer Brother        bladder  . Colon cancer Neg Hx     CURRENT MEDICATIONS:  Current Outpatient Medications  Medication Sig Dispense Refill  . ALPRAZolam (XANAX) 0.25 MG tablet Take 1 tablet (0.25 mg total) by mouth daily as needed for anxiety. 90 tablet 0  . Cholecalciferol (VITAMIN D3) 2000 UNITS TABS Take 2,000 Units by mouth daily.     . dorzolamide-timolol (COSOPT) 22.3-6.8 MG/ML ophthalmic solution     . glucose blood (ONE TOUCH ULTRA TEST) test strip CHECK BLOOD SUGAR ONCE DAILY 100 each 2  . hydrochlorothiazide (HYDRODIURIL) 25 MG tablet TAKE 1 TABLET BY MOUTH  DAILY 90 tablet 3  . hydroxyurea  (HYDREA) 500 MG capsule Take one tablet by mouth on Monday, Wednesday, and Friday 12 capsule 6  . metoprolol succinate (TOPROL-XL) 25 MG 24 hr tablet TAKE 1 TABLET BY MOUTH  DAILY 90 tablet 3  . Multiple Vitamins-Minerals (EYE VITAMINS PO) Take 1 tablet by mouth daily.    Marland Kitchen omeprazole (PRILOSEC) 20 MG capsule TAKE 1 CAPSULE BY MOUTH  DAILY 90 capsule 1  . Polyethyl Glycol-Propyl Glycol (SYSTANE OP) Apply 1-2 drops to eye 4 (four) times daily as needed (dry eyes).    . Propylene Glycol (SYSTANE BALANCE) 0.6 % SOLN Apply to eye.    . sertraline (ZOLOFT) 100 MG tablet Take 1 tablet (100 mg total) by mouth daily. 90 tablet 3  . XARELTO 15 MG TABS tablet TAKE 1 TABLET BY MOUTH  DAILY 90 tablet 1   No current facility-administered  medications for this visit.    ALLERGIES:  Allergies  Allergen Reactions  . Codeine Nausea Only  . Latanoprost Other (See Comments)    Red and burning  . Tramadol Nausea Only    PHYSICAL EXAM:  Performance status (ECOG): 2 - Symptomatic, <50% confined to bed  Vitals:   02/06/21 1110  BP: (!) 151/71  Pulse: 65  Resp: 20  Temp: (!) 97.1 F (36.2 C)  SpO2: 95%   Wt Readings from Last 3 Encounters:  02/06/21 155 lb 14.4 oz (70.7 kg)  10/02/20 156 lb 4.8 oz (70.9 kg)  08/22/20 155 lb 12.8 oz (70.7 kg)   Physical Exam Constitutional:      Appearance: Normal appearance.  HENT:     Head: Normocephalic and atraumatic.     Nose: Nose normal.     Mouth/Throat:     Mouth: Mucous membranes are moist.     Pharynx: Oropharynx is clear.  Eyes:     Extraocular Movements: Extraocular movements intact.     Conjunctiva/sclera: Conjunctivae normal.     Pupils: Pupils are equal, round, and reactive to light.  Cardiovascular:     Rate and Rhythm: Normal rate. Rhythm irregular.  Pulmonary:     Effort: Pulmonary effort is normal.     Breath sounds: Normal breath sounds.     Comments: Diminished bases Abdominal:     General: Abdomen is flat. Bowel sounds are  normal.     Palpations: Abdomen is soft. There is no mass.     Tenderness: There is no abdominal tenderness.  Musculoskeletal:        General: Normal range of motion.     Cervical back: Normal range of motion and neck supple.     Comments: Trace bilateral ankle edema  Lymphadenopathy:     Cervical: No cervical adenopathy.  Skin:    General: Skin is warm and dry.     Findings: Lesion (Small darkended patches of bilateral lower extremities) present.  Neurological:     General: No focal deficit present.     Mental Status: She is alert and oriented to person, place, and time.  Psychiatric:        Mood and Affect: Mood normal.        Behavior: Behavior normal.     LABORATORY DATA:  I have reviewed the labs as listed.  CBC Latest Ref Rng & Units 02/04/2021 09/30/2020 06/14/2020  WBC 3.4 - 10.8 x10E3/uL 7.7 6.8 6.8  Hemoglobin 11.1 - 15.9 g/dL 13.5 14.0 13.3  Hematocrit 34.0 - 46.6 % 39.8 40.9 39.0  Platelets 150 - 450 x10E3/uL 451(H) 462(H) 554(H)   CMP Latest Ref Rng & Units 02/04/2021 09/30/2020 06/14/2020  Glucose 65 - 99 mg/dL 89 109(H) 108(H)  BUN 10 - 36 mg/dL 21 18 19   Creatinine 0.57 - 1.00 mg/dL 1.23(H) 1.34(H) 1.22(H)  Sodium 134 - 144 mmol/L 143 144 141  Potassium 3.5 - 5.2 mmol/L 4.5 4.4 4.4  Chloride 96 - 106 mmol/L 103 105 102  CO2 20 - 29 mmol/L 24 23 25   Calcium 8.7 - 10.3 mg/dL 10.0 9.6 9.8  Total Protein 6.0 - 8.5 g/dL 6.7 6.6 6.6  Total Bilirubin 0.0 - 1.2 mg/dL 0.5 0.5 0.5  Alkaline Phos 44 - 121 IU/L 77 75 80  AST 0 - 40 IU/L 24 18 25   ALT 0 - 32 IU/L 16 12 13       Component Value Date/Time   RBC 4.32 02/04/2021 1021   RBC 4.33 04/03/2020  0925   MCV 92 02/04/2021 1021   MCH 31.3 02/04/2021 1021   MCH 31.2 04/03/2020 0925   MCHC 33.9 02/04/2021 1021   MCHC 32.9 04/03/2020 0925   RDW 13.2 02/04/2021 1021   LYMPHSABS 1.3 02/04/2021 1021   MONOABS 0.8 04/03/2020 0925   EOSABS 0.1 02/04/2021 1021   BASOSABS 0.1 02/04/2021 1021    DIAGNOSTIC IMAGING:  I  have independently reviewed the scans and discussed with the patient. No results found.   ASSESSMENT: 1.  JAK2 positive myeloproliferative disorder (essential thrombocytosis) -JAK2 V617F mutation detected December 2016 -Hydroxyurea started around December 2016. -Was taking 1 tablet on Monday Wednesday/Friday but did not tolerate dose due to side effects (increased fatigue, sores on legs)  -Physical examination did not reveal any palpable splenomegaly. -Reviewed labs from 02/04/2021.  Platelet count is 451. Hematocrit is 39.8. White count is normal.  2.  Atrial fibrillation: -Xarelto for stroke prophylaxis.  3. Postural dizziness: -Symptoms likely consistent with orthostatic dizziness although patient states that meclizine has helped her in the past  4. Hallucinations: -Patient has intermittent hallucinations (1 to 2 per week), such as seeing deceased family members for a few seconds after she wakes up from sleep -Patient thinks that these may be due to her Zoloft or Hydrea   PLAN:  1.  JAK2 positive myeloproliferative disorder (essential thrombocytosis) -Platelets 451. Goal is platelets less than 400, but will allow platelet count 400-500 in presence of significant medication side effects and in absence of thromboembolic events. Will consider alternate medication if platelet count increases to greater than 500 or patient begins to exhibit signs and symptoms of thrombocytosis. -Will decrease hydroxyurea to 1 tablet twice weekly on Mondays and Thursdays due to increased side effects on higher dose  -RTC 3 months with repeat labs (CBC, CMP, LDH)  2.  Atrial fibrillation: -Continue Xarelto.  No bleeding issues reported.  3. Postural dizziness: -Symptoms likely consistent with orthostatic dizziness although patient states that meclizine has helped her in the past -Discussed adequate water intake/hydration, safety precautions at home such as standing up slowly to avoid falls -Will  prescribe low-dose meclizine #30 tablets, as patient states this has helped in the past -Instructed patient to follow-up with her PCP for more detailed work-up and treatment plan  4. Hallucinations: -Patient has intermittent hallucinations (1 to 2 per week), such as seeing deceased family members for a few seconds after she wakes up from sleep -Patient thinks that these may be due to her Zoloft or Hydrea -Will continue current dose of Hydrea as above, but will continue to monitor -Instructed patient to follow-up with PCP for possible dose adjustment of other medications  PLAN SUMMARY: -RTC in 3 months -Labs 1 week before next appointment (CBC, CMP, LDH) -Decreased dose of Hydrea to twice weekly on Mondays and Thursdays -Low-dose Antivert prescribed due to dizziness, instructed to follow-up with PCP  Orders placed this encounter:  No orders of the defined types were placed in this encounter.    Derek Jack, MD [place] [phone]   I, Tarri Abernethy PA-C, have seen this patient in conjunction with Dr. Sanda Linger. Greater than 50% of the visit was performed by Dr. Derek Jack.

## 2021-02-07 ENCOUNTER — Other Ambulatory Visit: Payer: Self-pay | Admitting: Family Medicine

## 2021-02-25 NOTE — Progress Notes (Signed)
Cardiology Office Note   Date:  02/26/2021   ID:  Kristy [name], DOB 1928/09/29, MRN [id number]  PCP:  [name], DO  Cardiologist:   Minus Breeding, MD   Chief Complaint  Patient presents with  . Atrial Fibrillation      History of Present Illness: Kristy [name] is a 87 y.o. female who presents for follow up of atrial fib.   She had been managed with Xarelto.  She did have some pulmonary HTN and TR on echo.  However, follow up echo in Feb of 2017 demonstrated only mod TR and no significant pulmonary HTN.  She was in atrial flutter and cardioversion was planned but she went back into NSR on her own.  She was in atrial fib when I saw her recently.    Since I last saw her she is probably a little depressed because she still has not started going back to church or doing much in the way of social.  She did have her 89th birthday party at the [place].  She is fallen several times but she does not have any presyncope or syncope.  It sounds like she has some mild orthostatic dizziness but this is not related to the falls.  It certainly loss of balance.  She does not always use her cane or walker.  She has not had any injury other than some bruising.  She has not noticed any palpitations, presyncope or syncope.  She is not having any chest pressure, neck or arm discomfort.  She has had no weight gain or edema.  She has her chronic dyspnea.  Past Medical History:  Diagnosis Date  . A-fib ([place])   . Cataract   . Closed left arm fracture 2016  . Colon polyps   . Diverticulosis   . Endometrial hyperplasia without atypia, simple   . Endometrial polyp   . Fatty liver   . GERD (gastroesophageal reflux disease)   . Hiatal hernia   . Hyperplastic colon polyp   . Hypertension   . Lipoma   . Obesity   . Optic neuropathy of both eyes 1989, 1997  . SOB (shortness of breath)   . Thrombocytosis     Past Surgical History:  Procedure Laterality Date  . breast biopsy    . CATARACT  EXTRACTION    . CHOLECYSTECTOMY  03/2002  . DILATION AND CURETTAGE OF UTERUS     x 2  . EYE SURGERY    . HERNIA REPAIR    . KNEE SURGERY    . optic neuritis    . umbilical hernia repair  09/2002     Current Outpatient Medications  Medication Sig Dispense Refill  . ALPRAZolam (XANAX) 0.25 MG tablet Take 1 tablet (0.25 mg total) by mouth daily as needed for anxiety. 90 tablet 0  . Cholecalciferol (VITAMIN D3) 2000 UNITS TABS Take 2,000 Units by mouth daily.     . dorzolamide-timolol (COSOPT) 22.3-6.8 MG/ML ophthalmic solution     . hydroxyurea (HYDREA) 500 MG capsule Take one tablet by mouth on Monday and Thursday 12 capsule 6  . meclizine (ANTIVERT) 12.5 MG tablet Take 1 tablet (12.5 mg total) by mouth as needed. Take as needed for  vertigo/ dizziness 30 tablet 0  . Multiple Vitamins-Minerals (EYE VITAMINS PO) Take 1 tablet by mouth daily.    Marland Kitchen omeprazole (PRILOSEC) 20 MG capsule TAKE 1 CAPSULE BY MOUTH  DAILY 90 capsule 3  . Polyethyl Glycol-Propyl Glycol (SYSTANE OP) Apply  1-2 drops to eye 4 (four) times daily as needed (dry eyes).    . sertraline (ZOLOFT) 100 MG tablet Take 1 tablet (100 mg total) by mouth daily. (Patient taking differently: Take 50 mg by mouth daily.) 90 tablet 3  . glucose blood (ONE TOUCH ULTRA TEST) test strip CHECK BLOOD SUGAR ONCE DAILY 100 each 2  . hydrochlorothiazide (HYDRODIURIL) 25 MG tablet Take 1 tablet (25 mg total) by mouth daily. 90 tablet 3  . metoprolol succinate (TOPROL-XL) 25 MG 24 hr tablet Take 1 tablet (25 mg total) by mouth daily. 90 tablet 3  . Rivaroxaban (XARELTO) 15 MG TABS tablet Take 1 tablet (15 mg total) by mouth daily. 90 tablet 3   No current facility-administered medications for this visit.    Allergies:   Codeine, Latanoprost, and Tramadol    ROS:  Please see the history of present illness.   Otherwise, review of systems are positive for none.   All other systems are reviewed and negative.    PHYSICAL EXAM: VS:  BP 140/80    Pulse 72   Ht 5\' 2"  (1.575 m)   Wt 155 lb (70.3 kg)   BMI 28.35 kg/m  , BMI Body mass index is 28.35 kg/m.  GENERAL:  Well appearing NECK:  No jugular venous distention, waveform within normal limits, carotid upstroke brisk and symmetric, no bruits, no thyromegaly LUNGS:  Clear to auscultation bilaterally CHEST:  Unremarkable HEART:  PMI not displaced or sustained,S1 and S2 within normal limits, no S3,  no clicks, no rubs, no murmurs, irregular  ABD:  Flat, positive bowel sounds normal in frequency in pitch, no bruits, no rebound, no guarding, no midline pulsatile mass, no hepatomegaly, no splenomegaly EXT:  2 plus pulses throughout, no edema, no cyanosis no clubbing   EKG:  EKG is  ordered today. The ekg ordered today demonstrates atrial fibrillation, rate 77, left axis deviation, poor anterior R wave progression.  Right bundle branch, left anterior fascicular block   Recent Labs: 06/14/2020: TSH 2.840 02/04/2021: ALT 16; BUN 21; Creatinine, Ser 1.23; Hemoglobin 13.5; Platelets 451; Potassium 4.5; Sodium 143    Lipid Panel    Component Value Date/Time   CHOL 166 02/08/2019 1107   TRIG 113 02/08/2019 1107   HDL 53 02/08/2019 1107   CHOLHDL 3.1 02/08/2019 1107   LDLCALC 90 02/08/2019 1107      Wt Readings from Last 3 Encounters:  02/26/21 155 lb (70.3 kg)  02/06/21 155 lb 14.4 oz (70.7 kg)  10/02/20 156 lb 4.8 oz (70.9 kg)      Other studies Reviewed: Additional studies/ records that were reviewed today include:  Labs. Review of the above records demonstrates:  Please see elsewhere in the note.     ASSESSMENT AND PLAN:  ATRIAL FLUTTER:   [name] has a CHA2DS2 - VASc score of 5.   Her creatinine is 1.23.  However, it has been more elevated in the past.  Her GFR is borderline.  It may be conscious decision with conversation with her to use a lower dose.  HTN:  The blood pressure is at target.  No change  DYSPNEA:    She has had an extensive work-up.  She is at  baseline.  No change.  CONDUCTION DISTURBANCE: Conduction disturbance as described she has not had any syncope or presyncope.  We talked about this.  She would let me know if that happens.  She does have some mild orthostatic symptoms and we talked  about precautions and avoiding blood pressure drop if symptomatic.   Current medicines are reviewed at length with the patient today.  The patient does not have concerns regarding medicines.  The following changes have been made:  no change  Labs/ tests ordered today include: None  Orders Placed This Encounter  Procedures  . EKG 12-Lead     Disposition:   FU with me in 12 months.     Signed, Minus Breeding, MD  02/26/2021 11:00 AM    [place]

## 2021-02-26 ENCOUNTER — Ambulatory Visit: Payer: Medicare Other | Admitting: Cardiology

## 2021-02-26 ENCOUNTER — Encounter: Payer: Self-pay | Admitting: Cardiology

## 2021-02-26 ENCOUNTER — Other Ambulatory Visit: Payer: Self-pay

## 2021-02-26 VITALS — BP 140/80 | HR 72 | Ht 62.0 in | Wt 155.0 lb

## 2021-02-26 DIAGNOSIS — I10 Essential (primary) hypertension: Secondary | ICD-10-CM

## 2021-02-26 DIAGNOSIS — R0602 Shortness of breath: Secondary | ICD-10-CM

## 2021-02-26 DIAGNOSIS — I480 Paroxysmal atrial fibrillation: Secondary | ICD-10-CM

## 2021-02-26 MED ORDER — METOPROLOL SUCCINATE ER 25 MG PO TB24: 25.0000 mg | ORAL_TABLET | Freq: Every day | ORAL | 3 refills | Status: DC

## 2021-02-26 MED ORDER — RIVAROXABAN 15 MG PO TABS: 15.0000 mg | ORAL_TABLET | Freq: Every day | ORAL | 3 refills | Status: DC

## 2021-02-26 MED ORDER — HYDROCHLOROTHIAZIDE 25 MG PO TABS: 25.0000 mg | ORAL_TABLET | Freq: Every day | ORAL | 3 refills | Status: DC

## 2021-02-26 NOTE — Patient Instructions (Addendum)
Medication Instructions:  The current medical regimen is effective;  continue present plan and medications.  *If you need a refill on your cardiac medications before your next appointment, please call your pharmacy*  Follow-Up: At [facility], you and your health needs are our priority.  As part of our continuing mission to provide you with exceptional heart care, we have created designated Provider Care Teams.  These Care Teams include your primary Cardiologist (physician) and Advanced Practice Providers (APPs -  Physician Assistants and Nurse Practitioners) who all work together to provide you with the care you need, when you need it.  We recommend signing up for the patient portal called "MyChart".  Sign up information is provided on this After Visit Summary.  MyChart is used to connect with patients for Virtual Visits (Telemedicine).  Patients are able to view lab/test results, encounter notes, upcoming appointments, etc.  Non-urgent messages can be sent to your provider as well.   To learn more about what you can do with MyChart, go to [email or web address].    Your next appointment:   12 month(s)  The format for your next appointment:   In Person  Provider:   James Hochrein, MD   Thank you for choosing [facility]!!     

## 2021-03-04 ENCOUNTER — Ambulatory Visit: Payer: Medicare Other | Admitting: Family Medicine

## 2021-04-01 ENCOUNTER — Encounter: Payer: Self-pay | Admitting: Family Medicine

## 2021-04-01 ENCOUNTER — Ambulatory Visit (INDEPENDENT_AMBULATORY_CARE_PROVIDER_SITE_OTHER): Payer: Medicare Other | Admitting: Family Medicine

## 2021-04-01 ENCOUNTER — Other Ambulatory Visit: Payer: Self-pay

## 2021-04-01 VITALS — BP 133/72 | HR 72 | Temp 97.4°F | Ht 62.0 in | Wt 153.4 lb

## 2021-04-01 DIAGNOSIS — I10 Essential (primary) hypertension: Secondary | ICD-10-CM

## 2021-04-01 DIAGNOSIS — F330 Major depressive disorder, recurrent, mild: Secondary | ICD-10-CM

## 2021-04-01 DIAGNOSIS — F410 Panic disorder [episodic paroxysmal anxiety] without agoraphobia: Secondary | ICD-10-CM

## 2021-04-01 DIAGNOSIS — I700 Atherosclerosis of aorta: Secondary | ICD-10-CM | POA: Diagnosis not present

## 2021-04-01 DIAGNOSIS — D473 Essential (hemorrhagic) thrombocythemia: Secondary | ICD-10-CM | POA: Diagnosis not present

## 2021-04-01 DIAGNOSIS — Z79891 Long term (current) use of opiate analgesic: Secondary | ICD-10-CM | POA: Diagnosis not present

## 2021-04-01 DIAGNOSIS — R443 Hallucinations, unspecified: Secondary | ICD-10-CM

## 2021-04-01 DIAGNOSIS — I480 Paroxysmal atrial fibrillation: Secondary | ICD-10-CM

## 2021-04-01 DIAGNOSIS — N1832 Chronic kidney disease, stage 3b: Secondary | ICD-10-CM | POA: Diagnosis not present

## 2021-04-01 MED ORDER — HYDROCHLOROTHIAZIDE 12.5 MG PO TABS: 12.5000 mg | ORAL_TABLET | Freq: Every day | ORAL | 3 refills | Status: DC

## 2021-04-01 MED ORDER — SERTRALINE HCL 50 MG PO TABS: 50.0000 mg | ORAL_TABLET | Freq: Every day | ORAL | 5 refills | Status: DC

## 2021-04-01 MED ORDER — ALPRAZOLAM 0.25 MG PO TABS: 0.1250 mg | ORAL_TABLET | Freq: Every day | ORAL | 0 refills | Status: DC | PRN

## 2021-04-01 NOTE — Progress Notes (Signed)
Subjective: CC: Generalized anxiety disorder with panic, thrombocytosis  PCP: [name], DO HPI:Kristy [name] is a 87 y.o. female presenting to clinic today for:  1. generalized anxiety disorder w/ panic Patient is compliant with sertraline 50 mg daily.  This is a recent reduction as she was having hallucinations.  It is difficult to tell if this is related to SSRI use of hydroxyurea.  She seems to be doing well on 50 mg of sertraline.  She has very sparing use of the alprazolam and often will only take 0.125 mg if she does take the medicine.  Last date prescribed was in 2020 by her previous PCP.  She is asking for renewal as her supply is quite old.  Denies any excessive daytime sedation, falls.  She never had hallucinations with this medication.    2.  Thrombocytosis/atrial fibrillation Patient is followed by hematology for this.  She has since discontinued hydroxyurea and this has resolved her hallucinations.  She is continued on her Xarelto 15 mg daily.  She denies any hematochezia, melena, hematuria or epistaxis.  No heart palpitations.  She has had some falls over the last year.   ROS: Per HPI  Allergies  Allergen Reactions  . Codeine Nausea Only  . Latanoprost Other (See Comments)    Red and burning  . Tramadol Nausea Only   Past Medical History:  Diagnosis Date  . A-fib ([place])   . Cataract   . Closed left arm fracture 2016  . Colon polyps   . Diverticulosis   . Endometrial hyperplasia without atypia, simple   . Endometrial polyp   . Fatty liver   . GERD (gastroesophageal reflux disease)   . Hiatal hernia   . Hyperplastic colon polyp   . Hypertension   . Lipoma   . Obesity   . Optic neuropathy of both eyes 1989, 1997  . SOB (shortness of breath)   . Thrombocytosis     Current Outpatient Medications:  .  ALPRAZolam (XANAX) 0.25 MG tablet, Take 1 tablet (0.25 mg total) by mouth daily as needed for anxiety., Disp: 90 tablet, Rfl: 0 .  Cholecalciferol  (VITAMIN D3) 2000 UNITS TABS, Take 2,000 Units by mouth daily. , Disp: , Rfl:  .  dorzolamide-timolol (COSOPT) 22.3-6.8 MG/ML ophthalmic solution, , Disp: , Rfl:  .  glucose blood (ONE TOUCH ULTRA TEST) test strip, CHECK BLOOD SUGAR ONCE DAILY, Disp: 100 each, Rfl: 2 .  hydrochlorothiazide (HYDRODIURIL) 25 MG tablet, Take 1 tablet (25 mg total) by mouth daily., Disp: 90 tablet, Rfl: 3 .  hydroxyurea (HYDREA) 500 MG capsule, Take one tablet by mouth on Monday and Thursday, Disp: 12 capsule, Rfl: 6 .  meclizine (ANTIVERT) 12.5 MG tablet, Take 1 tablet (12.5 mg total) by mouth as needed. Take as needed for  vertigo/ dizziness, Disp: 30 tablet, Rfl: 0 .  metoprolol succinate (TOPROL-XL) 25 MG 24 hr tablet, Take 1 tablet (25 mg total) by mouth daily., Disp: 90 tablet, Rfl: 3 .  Multiple Vitamins-Minerals (EYE VITAMINS PO), Take 1 tablet by mouth daily., Disp: , Rfl:  .  omeprazole (PRILOSEC) 20 MG capsule, TAKE 1 CAPSULE BY MOUTH  DAILY, Disp: 90 capsule, Rfl: 3 .  Polyethyl Glycol-Propyl Glycol (SYSTANE OP), Apply 1-2 drops to eye 4 (four) times daily as needed (dry eyes)., Disp: , Rfl:  .  Rivaroxaban (XARELTO) 15 MG TABS tablet, Take 1 tablet (15 mg total) by mouth daily., Disp: 90 tablet, Rfl: 3 .  sertraline (ZOLOFT) 100  MG tablet, Take 1 tablet (100 mg total) by mouth daily. (Patient taking differently: Take 50 mg by mouth daily.), Disp: 90 tablet, Rfl: 3 Social History   Socioeconomic History  . Marital status: Widowed    Spouse name: Not on file  . Number of children: Not on file  . Years of education: Not on file  . Highest education level: Not on file  Occupational History  . 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 Smoking status: Former Smoker    Quit date: 12/07/1982    Years since quitting: 38.3  . Smokeless tobacco: Never Used  Vaping Use  . Vaping Use: Never used  Substance and Sexual Activity  . Alcohol use:  No  . Drug use: No  . Sexual activity: Never  Other Topics Concern  . Not on file  Social History Narrative  . Not on file   Social Determinants of Health   Financial Resource Strain: Not on file  Food Insecurity: Not on file  Transportation Needs: Not on file  Physical Activity: Not on file  Stress: Not on file  Social Connections: Not on file  Intimate Partner Violence: Not on file   Family History  Problem Relation Age of Onset  . Diabetes Mother   . Diabetes Sister   . Stroke Father   . Diabetes Brother   . Heart disease Brother   . Cancer Sister        lung  . Cancer Sister        vulva  . Cancer Sister   . Dementia Sister   . Osteoporosis Sister   . Diabetes Sister   . Osteoporosis Sister   . Diabetes Sister   . Cancer Brother        bladder  . Colon cancer Neg Hx     Objective: Office vital signs reviewed. BP 133/72   Pulse 72   Temp (!) 97.4 F (36.3 C)   Ht 5\' 2"  (1.575 m)   Wt 153 lb 6.4 oz (69.6 kg)   SpO2 100%   BMI 28.06 kg/m   Physical Examination:  General: Awake, alert, well nourished, No acute distress HEENT: Normal; sclera white Cardio irregularly irregular with rate control, S1S2 heard, no murmurs appreciated Pulm: clear to auscultation bilaterally, no wheezes, rhonchi or rales; normal work of breathing on room air Extremities: warm, well perfused, No edema, cyanosis or clubbing; +2 pulses bilaterally MSK: Normal tone.   Assessment/ Plan: 87 y.o. female   Essential hypertension - Plan: hydrochlorothiazide (HYDRODIURIL) 12.5 MG tablet  PAF (paroxysmal atrial fibrillation) (HCC)  Thoracic aortic atherosclerosis (HCC)  Stage 3b chronic kidney disease (HCC)  Essential thrombocytosis (HCC)  Mild episode of recurrent major depressive disorder ([place]) - Plan: sertraline (ZOLOFT) 50 MG tablet  Hallucinations  Panic attack - Plan: ALPRAZolam (XANAX) 0.25 MG tablet, ToxASSURE Select 13 (MW), Urine  Reduce hydrochlorothiazide to  12.5 mg daily.  It sounds like she is having some orthostasis.  She had rate controlled atrial fibrillation on exam today  I reviewed her most recent labs.  No further labs were collected today  We discussed the risk of stroke and other cardiovascular events due to increased platelet level.  I am unsure as to how much risk she has given chronic anticoagulation with Xarelto but it sounds like she is unable to tolerate hydroxyurea  Her depression is stable with 50 mg of Zoloft and this new prescription  has been sent  Hallucinations have resolved since discontinuing hydroxyurea  Alprazolam sent but we discussed the risks of this medication.  It sounds like she is using it extremely sparingly.  UDS and CSC were updated as per office policy.  She will follow-up in the next 3 to 6 months, sooner if needed  No orders of the defined types were placed in this encounter.  No orders of the defined types were placed in this encounter.    [name], DO [place] [phone]

## 2021-04-02 ENCOUNTER — Other Ambulatory Visit (HOSPITAL_COMMUNITY): Payer: Medicare Other

## 2021-04-08 ENCOUNTER — Ambulatory Visit (INDEPENDENT_AMBULATORY_CARE_PROVIDER_SITE_OTHER): Payer: Medicare Other | Admitting: Pharmacist

## 2021-04-08 ENCOUNTER — Other Ambulatory Visit: Payer: Self-pay

## 2021-04-08 DIAGNOSIS — I480 Paroxysmal atrial fibrillation: Secondary | ICD-10-CM | POA: Diagnosis not present

## 2021-04-08 LAB — TOXASSURE SELECT 13 (MW), URINE

## 2021-04-08 MED ORDER — RIVAROXABAN 15 MG PO TABS: 15.0000 mg | ORAL_TABLET | Freq: Every day | ORAL | 11 refills | Status: DC

## 2021-04-08 NOTE — Progress Notes (Signed)
    04/08/2021 Name: Kristy [name] MRN: [id number] DOB: February 15, 1928   S: 77 YOF Presents for medication management and medication assistance for Xarelto. Patient is currently on Xarelto for atrial fibrillation.  She is stable and tolerating medication.  Her copays are very expensive. Insurance coverage/medication affordability:  [employer] medicare   Patient reports adherence with medications.   O: CBC    Component Value Date/Time   WBC 7.7 02/04/2021 1021   WBC 7.7 04/03/2020 0925   RBC 4.32 02/04/2021 1021   RBC 4.33 04/03/2020 0925   HGB 13.5 02/04/2021 1021   HCT 39.8 02/04/2021 1021   PLT 451 (H) 02/04/2021 1021   MCV 92 02/04/2021 1021   MCH 31.3 02/04/2021 1021   MCH 31.2 04/03/2020 0925   MCHC 33.9 02/04/2021 1021   MCHC 32.9 04/03/2020 0925   RDW 13.2 02/04/2021 1021   LYMPHSABS 1.3 02/04/2021 1021   MONOABS 0.8 04/03/2020 0925   EOSABS 0.1 02/04/2021 1021   BASOSABS 0.1 02/04/2021 1021    A/P:   Patient has been compliant with Xarelto, but now cost has increased due to Medicare coverage gap.  Information faxed to the health department to obtain medication assistance for Xarelto. Patient to follow up regarding this process.     Information about your medication: Anticoagulant   Generic Name (Brand): Rivaroxaban (Xarelto)   Xarelto is used to reduce the risk of forming blood clots that cause a stroke due to an irregular heartbeat.   Common SIDE EFFECTS you may experience include: extremity pain and increased risk of bleeding or bruising.   This drug must be taken consistently as prescribed to maintain its effect.   Multiple drug-drug interactions exist for this medication.  Consult healthcare professional prior to starting a new drug.   Tell your physicians and dentists that you are taking this drug before elective surgery or invasive procedures and before any new drug is prescribed.   Contact your health care provider if you experience: any signs of blood loss or  unusual bleeding.    Written patient instructions provided.  Total time in face to face counseling 20 minutes.    Regina Eck, PharmD, BCPS Clinical Pharmacist, [place]  II Phone [phone]

## 2021-04-09 ENCOUNTER — Ambulatory Visit (HOSPITAL_COMMUNITY): Payer: Medicare Other | Admitting: Hematology

## 2021-05-19 ENCOUNTER — Other Ambulatory Visit: Payer: Self-pay

## 2021-05-19 ENCOUNTER — Other Ambulatory Visit: Payer: Medicare Other

## 2021-05-19 ENCOUNTER — Other Ambulatory Visit: Payer: Self-pay | Admitting: *Deleted

## 2021-05-19 DIAGNOSIS — Z1589 Genetic susceptibility to other disease: Secondary | ICD-10-CM

## 2021-05-19 DIAGNOSIS — D473 Essential (hemorrhagic) thrombocythemia: Secondary | ICD-10-CM

## 2021-05-19 DIAGNOSIS — I10 Essential (primary) hypertension: Secondary | ICD-10-CM

## 2021-05-19 MED ORDER — HYDROCHLOROTHIAZIDE 12.5 MG PO TABS: 12.5000 mg | ORAL_TABLET | Freq: Every day | ORAL | 2 refills | Status: DC

## 2021-05-19 NOTE — Addendum Note (Signed)
Addended by: Francis Gaines on: 05/19/2021 10:07 AM   Modules accepted: Orders

## 2021-05-20 LAB — COMPREHENSIVE METABOLIC PANEL
ALT: 13 IU/L (ref 0–32)
AST: 22 IU/L (ref 0–40)
Albumin/Globulin Ratio: 2.5 — ABNORMAL HIGH (ref 1.2–2.2)
Albumin: 4.7 g/dL — ABNORMAL HIGH (ref 3.5–4.6)
Alkaline Phosphatase: 70 IU/L (ref 44–121)
BUN/Creatinine Ratio: 12 (ref 12–28)
BUN: 15 mg/dL (ref 10–36)
Bilirubin Total: 0.6 mg/dL (ref 0.0–1.2)
CO2: 24 mmol/L (ref 20–29)
Calcium: 9.9 mg/dL (ref 8.7–10.3)
Chloride: 105 mmol/L (ref 96–106)
Creatinine, Ser: 1.28 mg/dL — ABNORMAL HIGH (ref 0.57–1.00)
Globulin, Total: 1.9 g/dL (ref 1.5–4.5)
Glucose: 98 mg/dL (ref 65–99)
Potassium: 4.8 mmol/L (ref 3.5–5.2)
Sodium: 144 mmol/L (ref 134–144)
Total Protein: 6.6 g/dL (ref 6.0–8.5)
eGFR: 40 mL/min/{1.73_m2} — ABNORMAL LOW (ref >59)

## 2021-05-20 LAB — CBC WITH DIFFERENTIAL/PLATELET
Basophils Absolute: 0.1 10*3/uL (ref 0.0–0.2)
Basos: 1 %
EOS (ABSOLUTE): 0.1 10*3/uL (ref 0.0–0.4)
Eos: 1 %
Hematocrit: 41.4 % (ref 34.0–46.6)
Hemoglobin: 13.6 g/dL (ref 11.1–15.9)
Immature Grans (Abs): 0 10*3/uL (ref 0.0–0.1)
Immature Granulocytes: 0 %
Lymphocytes Absolute: 0.9 10*3/uL (ref 0.7–3.1)
Lymphs: 16 %
MCH: 28.8 pg (ref 26.6–33.0)
MCHC: 32.9 g/dL (ref 31.5–35.7)
MCV: 88 fL (ref 79–97)
Monocytes Absolute: 0.5 10*3/uL (ref 0.1–0.9)
Monocytes: 9 %
Neutrophils Absolute: 4.2 10*3/uL (ref 1.4–7.0)
Neutrophils: 73 %
Platelets: 509 10*3/uL — ABNORMAL HIGH (ref 150–450)
RBC: 4.73 x10E6/uL (ref 3.77–5.28)
RDW: 13.2 % (ref 11.7–15.4)
WBC: 5.8 10*3/uL (ref 3.4–10.8)

## 2021-05-20 LAB — LACTATE DEHYDROGENASE: LDH: 258 IU/L — ABNORMAL HIGH (ref 119–226)

## 2021-05-21 NOTE — Progress Notes (Signed)
[place], [place] [address]   CLINIC:  Medical Oncology/Hematology  PCP:  [name], DO [address] [place] [address]  [phone]  REASON FOR VISIT:  Follow-up for essential thrombocytosis (JAK2+ myeloproliferative disorder)  PRIOR THERAPY: Hydroxyurea  CURRENT THERAPY: Hydroxyurea  INTERVAL HISTORY:  [name], a 87 y.o. female, returns for routine follow-up for her essential thrombocytosis (JAK2+ myeloproliferative disorder). [place] was last seen on 02/06/2021.  Today she reported feeling well. She stopped taking Hydrea 6 weeks ago due to hallucinations, hair loss, and increased bruising. She has no itching showers, and denies color change in her fingertips. She denies history of blood clots, but she is taking Xarelto due to Afib. She expresses interest in restarting Hydrea. She denies any recent infections or rashes.   REVIEW OF SYSTEMS:  Review of Systems  Constitutional:  Positive for fatigue (25%). Negative for appetite change.  Respiratory:  Positive for shortness of breath (exertion).   Cardiovascular:  Positive for palpitations (Afib).  Gastrointestinal:  Positive for diarrhea.  Skin:  Negative for itching.  Neurological:  Positive for dizziness.  Psychiatric/Behavioral:  Positive for depression.   All other systems reviewed and are negative.  PAST MEDICAL/SURGICAL HISTORY:  Past Medical History:  Diagnosis Date   A-fib [facility])    Cataract    Closed left arm fracture 2016   Colon polyps    Diverticulosis    Endometrial hyperplasia without atypia, simple    Endometrial polyp    Fatty liver    GERD (gastroesophageal reflux disease)    Hiatal hernia    Hyperplastic colon polyp    Hypertension    Lipoma    Obesity    Optic neuropathy of both eyes 1989, 1997   SOB (shortness of breath)    Thrombocytosis    Past Surgical History:  Procedure Laterality Date   breast biopsy     CATARACT EXTRACTION      CHOLECYSTECTOMY  03/2002   DILATION AND CURETTAGE OF UTERUS     x 2   EYE SURGERY     HERNIA REPAIR     KNEE SURGERY     optic neuritis     umbilical hernia repair  09/2002    SOCIAL HISTORY:  Social History   Socioeconomic History   Marital status: Widowed    Spouse name: Not on file   Number of children: Not on file   Years of education: Not on file   Highest education level: Not on file  Occupational History   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    Employer: RETIRED    Comment: Retired  Tobacco Use   Smoking status: Former    Pack years: 0.00    Types: Cigarettes    Quit date: 12/07/1982    Years since quitting: 38.4   Smokeless tobacco: Never  Vaping Use   Vaping Use: Never used  Substance and Sexual Activity   Alcohol use: No   Drug use: No   Sexual activity: Never  Other Topics Concern   Not on file  Social History Narrative   Not on file   Social Determinants of Health   Financial Resource Strain: Not on file  Food Insecurity: Not on file  Transportation Needs: Not on file  Physical Activity: Not on file  Stress: Not on file  Social Connections: Not on file  Intimate Partner Violence: Not on file    FAMILY HISTORY:  Family History  Problem Relation Age of Onset   Diabetes Mother    Diabetes Sister    Stroke Father    Diabetes Brother    Heart disease Brother    Cancer Sister        lung   Cancer Sister        vulva   Cancer Sister    Dementia Sister    Osteoporosis Sister    Diabetes Sister    Osteoporosis Sister    Diabetes Sister    Cancer Brother        bladder   Colon cancer Neg Hx     CURRENT MEDICATIONS:  Current Outpatient Medications  Medication Sig Dispense Refill   ALPRAZolam (XANAX) 0.25 MG tablet Take 0.5-1 tablets (0.125-0.25 mg total) by mouth daily as needed for anxiety. 30 tablet 0   Cholecalciferol (VITAMIN D3) 2000 UNITS TABS Take 2,000 Units by mouth daily.      dorzolamide-timolol (COSOPT)  22.3-6.8 MG/ML ophthalmic solution      glucose blood (ONE TOUCH ULTRA TEST) test strip CHECK BLOOD SUGAR ONCE DAILY 100 each 2   hydrochlorothiazide (HYDRODIURIL) 12.5 MG tablet Take 1 tablet (12.5 mg total) by mouth daily. 90 tablet 2   hydroxyurea (HYDREA) 500 MG capsule Take one tablet by mouth on Monday and Thursday 12 capsule 6   meclizine (ANTIVERT) 12.5 MG tablet Take 1 tablet (12.5 mg total) by mouth as needed. Take as needed for  vertigo/ dizziness 30 tablet 0   metoprolol succinate (TOPROL-XL) 25 MG 24 hr tablet Take 1 tablet (25 mg total) by mouth daily. 90 tablet 3   Multiple Vitamins-Minerals (EYE VITAMINS PO) Take 1 tablet by mouth daily.     omeprazole (PRILOSEC) 20 MG capsule TAKE 1 CAPSULE BY MOUTH  DAILY 90 capsule 3   Polyethyl Glycol-Propyl Glycol (SYSTANE OP) Apply 1-2 drops to eye 4 (four) times daily as needed (dry eyes).     Rivaroxaban (XARELTO) 15 MG TABS tablet Take 1 tablet (15 mg total) by mouth daily with supper. 30 tablet 11   sertraline (ZOLOFT) 50 MG tablet Take 1 tablet (50 mg total) by mouth daily. 30 tablet 5   No current facility-administered medications for this visit.    ALLERGIES:  Allergies  Allergen Reactions   Codeine Nausea Only   Latanoprost Other (See Comments)    Red and burning   Tramadol Nausea Only    PHYSICAL EXAM:  Performance status (ECOG): 2 - Symptomatic, <50% confined to bed  There were no vitals filed for this visit. Wt Readings from Last 3 Encounters:  04/01/21 153 lb 6.4 oz (69.6 kg)  02/26/21 155 lb (70.3 kg)  02/06/21 155 lb 14.4 oz (70.7 kg)   Physical Exam Vitals reviewed.  Constitutional:      Appearance: Normal appearance.  Cardiovascular:     Rate and Rhythm: Normal rate and regular rhythm.     Pulses: Normal pulses.     Heart sounds: Normal heart sounds.  Pulmonary:     Effort: Pulmonary effort is normal.     Breath sounds: Normal breath sounds.  Musculoskeletal:     Right lower leg: No edema.     Left  lower leg: No edema.  Neurological:     General: No focal deficit present.     Mental Status: She is alert and oriented to person, place, and time.  Psychiatric:        Mood and Affect: Mood normal.        Behavior: Behavior  normal.    LABORATORY DATA:  I have reviewed the labs as listed.  CBC Latest Ref Rng & Units 05/19/2021 02/04/2021 09/30/2020  WBC 3.4 - 10.8 x10E3/uL 5.8 7.7 6.8  Hemoglobin 11.1 - 15.9 g/dL 13.6 13.5 14.0  Hematocrit 34.0 - 46.6 % 41.4 39.8 40.9  Platelets 150 - 450 x10E3/uL 509(H) 451(H) 462(H)   CMP Latest Ref Rng & Units 05/19/2021 02/04/2021 09/30/2020  Glucose 65 - 99 mg/dL 98 89 109(H)  BUN 10 - 36 mg/dL 15 21 18   Creatinine 0.57 - 1.00 mg/dL 1.28(H) 1.23(H) 1.34(H)  Sodium 134 - 144 mmol/L 144 143 144  Potassium 3.5 - 5.2 mmol/L 4.8 4.5 4.4  Chloride 96 - 106 mmol/L 105 103 105  CO2 20 - 29 mmol/L 24 24 23   Calcium 8.7 - 10.3 mg/dL 9.9 10.0 9.6  Total Protein 6.0 - 8.5 g/dL 6.6 6.7 6.6  Total Bilirubin 0.0 - 1.2 mg/dL 0.6 0.5 0.5  Alkaline Phos 44 - 121 IU/L 70 77 75  AST 0 - 40 IU/L 22 24 18   ALT 0 - 32 IU/L 13 16 12       Component Value Date/Time   RBC 4.73 05/19/2021 1052   RBC 4.33 04/03/2020 0925   MCV 88 05/19/2021 1052   MCH 28.8 05/19/2021 1052   MCH 31.2 04/03/2020 0925   MCHC 32.9 05/19/2021 1052   MCHC 32.9 04/03/2020 0925   RDW 13.2 05/19/2021 1052   LYMPHSABS 0.9 05/19/2021 1052   MONOABS 0.8 04/03/2020 0925   EOSABS 0.1 05/19/2021 1052   BASOSABS 0.1 05/19/2021 1052    DIAGNOSTIC IMAGING:  I have independently reviewed the scans and discussed with the patient. No results found.   ASSESSMENT:  1.  JAK2 positive myeloproliferative disorder (essential thrombocytosis) -JAK2 V617F mutation detected December 2016 -Hydroxyurea started around December 2016. -Was taking 1 tablet on Monday Wednesday/Friday but did not tolerate dose due to side effects (increased fatigue, sores on legs) -Physical examination did not reveal any  palpable splenomegaly. -Reviewed labs from 02/04/2021.  Platelet count is 451. Hematocrit is 39.8. White count is normal.   2.  Atrial fibrillation: -Xarelto for stroke prophylaxis.   3. Postural dizziness: -Symptoms likely consistent with orthostatic dizziness although patient states that meclizine has helped her in the past   4. Hallucinations: -Patient has intermittent hallucinations (1 to 2 per week), such as seeing deceased family members for a few seconds after she wakes up from sleep -Patient thinks that these may be due to her Zoloft or Hydrea   PLAN:  1.  JAK2 positive myeloproliferative disorder (essential thrombocytosis) - She was taking hydroxyurea 500 mg on Monday and Thursday.  She stopped taking it about 6 weeks ago secondary to tiredness and hallucinations.  She also had some mild hair loss. - Hallucinations did not improve after stopping the hydroxyurea.  Tiredness also did not improve significantly. - We reviewed her labs from 05/19/2021.  Platelet count is elevated at 509.  This was 451 on 02/04/2021.  Hematocrit and white count was normal.  LDH was elevated at 258. - We discussed alternative agents and their side effects.  Patient would like to restart back on hydroxyurea twice weekly. - We will reevaluate her in 2 months with repeat labs.   2.  Atrial fibrillation: - Continue Xarelto.  No bleeding issues reported.      Orders placed this encounter:  No orders of the defined types were placed in this encounter.    Derek Jack, MD [facility]  [facility] [place] [phone]   I, Thana Ates, am acting as a scribe for Dr. Derek Jack.  I, Derek Jack MD, have reviewed the above documentation for accuracy and completeness, and I agree with the above.

## 2021-05-22 ENCOUNTER — Other Ambulatory Visit: Payer: Self-pay

## 2021-05-22 ENCOUNTER — Inpatient Hospital Stay (HOSPITAL_COMMUNITY): Payer: Medicare Other | Attending: Hematology | Admitting: Hematology

## 2021-05-22 VITALS — BP 171/64 | HR 79 | Temp 97.8°F | Resp 17 | Wt 153.4 lb

## 2021-05-22 DIAGNOSIS — D473 Essential (hemorrhagic) thrombocythemia: Secondary | ICD-10-CM

## 2021-05-22 DIAGNOSIS — Z79899 Other long term (current) drug therapy: Secondary | ICD-10-CM | POA: Insufficient documentation

## 2021-05-22 DIAGNOSIS — Z7901 Long term (current) use of anticoagulants: Secondary | ICD-10-CM | POA: Diagnosis not present

## 2021-05-22 DIAGNOSIS — I10 Essential (primary) hypertension: Secondary | ICD-10-CM | POA: Insufficient documentation

## 2021-05-22 DIAGNOSIS — Z1589 Genetic susceptibility to other disease: Secondary | ICD-10-CM

## 2021-05-22 DIAGNOSIS — I4891 Unspecified atrial fibrillation: Secondary | ICD-10-CM | POA: Diagnosis not present

## 2021-05-22 NOTE — Patient Instructions (Addendum)
[facility] at [facility] Discharge Instructions  You were seen today by Dr. Katragadda. He went over your recent results. Dr. Katragadda will see you back in 2 months for labs and follow up.   Thank you for choosing [facility] at [facility] to provide your oncology and hematology care.  To afford each patient quality time with our provider, please arrive at least 15 minutes before your scheduled appointment time.   If you have a lab appointment with the Cancer Center please come in thru the Main Entrance and check in at the main information desk  You need to re-schedule your appointment should you arrive 10 or more minutes late.  We strive to give you quality time with our providers, and arriving late affects you and other patients whose appointments are after yours.  Also, if you no show three or more times for appointments you may be dismissed from the clinic at the providers discretion.     Again, thank you for choosing [facility].  Our hope is that these requests will decrease the amount of time that you wait before being seen by our physicians.       _____________________________________________________________  Should you have questions after your visit to [facility], please contact our office at [phone] between the hours of 8:00 a.m. and 4:30 p.m.  Voicemails left after 4:00 p.m. will not be returned until the following business day.  For prescription refill requests, have your pharmacy contact our office and allow 72 hours.    Cancer Center Support Programs:   > Cancer Support Group  2nd Tuesday of the month 1pm-2pm, Journey Room   

## 2021-05-29 DIAGNOSIS — H401233 Low-tension glaucoma, bilateral, severe stage: Secondary | ICD-10-CM | POA: Diagnosis not present

## 2021-05-29 DIAGNOSIS — H353132 Nonexudative age-related macular degeneration, bilateral, intermediate dry stage: Secondary | ICD-10-CM | POA: Diagnosis not present

## 2021-05-29 DIAGNOSIS — H47013 Ischemic optic neuropathy, bilateral: Secondary | ICD-10-CM | POA: Diagnosis not present

## 2021-05-29 DIAGNOSIS — Z961 Presence of intraocular lens: Secondary | ICD-10-CM | POA: Diagnosis not present

## 2021-05-29 DIAGNOSIS — H04123 Dry eye syndrome of bilateral lacrimal glands: Secondary | ICD-10-CM | POA: Diagnosis not present

## 2021-06-26 DIAGNOSIS — D1801 Hemangioma of skin and subcutaneous tissue: Secondary | ICD-10-CM | POA: Diagnosis not present

## 2021-06-26 DIAGNOSIS — Z85828 Personal history of other malignant neoplasm of skin: Secondary | ICD-10-CM | POA: Diagnosis not present

## 2021-06-26 DIAGNOSIS — D2372 Other benign neoplasm of skin of left lower limb, including hip: Secondary | ICD-10-CM | POA: Diagnosis not present

## 2021-06-26 DIAGNOSIS — L821 Other seborrheic keratosis: Secondary | ICD-10-CM | POA: Diagnosis not present

## 2021-06-26 DIAGNOSIS — L814 Other melanin hyperpigmentation: Secondary | ICD-10-CM | POA: Diagnosis not present

## 2021-07-01 ENCOUNTER — Ambulatory Visit (INDEPENDENT_AMBULATORY_CARE_PROVIDER_SITE_OTHER): Payer: Medicare Other | Admitting: Family Medicine

## 2021-07-01 ENCOUNTER — Other Ambulatory Visit: Payer: Self-pay

## 2021-07-01 VITALS — BP 147/69 | Temp 97.8°F | Wt 150.0 lb

## 2021-07-01 DIAGNOSIS — D473 Essential (hemorrhagic) thrombocythemia: Secondary | ICD-10-CM

## 2021-07-01 DIAGNOSIS — I700 Atherosclerosis of aorta: Secondary | ICD-10-CM

## 2021-07-01 DIAGNOSIS — I10 Essential (primary) hypertension: Secondary | ICD-10-CM | POA: Diagnosis not present

## 2021-07-01 DIAGNOSIS — I480 Paroxysmal atrial fibrillation: Secondary | ICD-10-CM

## 2021-07-01 DIAGNOSIS — N1832 Chronic kidney disease, stage 3b: Secondary | ICD-10-CM

## 2021-07-01 NOTE — Progress Notes (Signed)
Subjective: CC: HTN PCP: [name], DO HPI:Kristy [name] is a 87 y.o. female presenting to clinic today for:  1. HTN w/ CKD associated with atrial fibrillation HCTZ was reduced to 1/2 tablet last visit.  She continues to have some dizziness but notes that it is much less than previous.  She is compliant with compression hose but sometimes does have positional dizziness even with these.  She reports a very strong family history of vertigo, some of her family has even had to have surgery for the vertigo.  She of course does not wish to pursue any that kind of invasive interventions.  She hydrates well.  She notes less swelling her lower extremities even though she has had a reduction in the hydrochlorothiazide.  Urine output has been good.  Denies any hematochezia, melena or bleeding elsewhere.  2.  Thrombocytosis Patient reports that she is discontinued the Hydrea again due to hair thinning and feeling extremely fatigued from the medicine.  She is compliant with aspirin and Xarelto.  She has follow-up with hematology in September   ROS: Per HPI  Allergies  Allergen Reactions   Codeine Nausea Only   Latanoprost Other (See Comments)    Red and burning   Tramadol Nausea Only   Past Medical History:  Diagnosis Date   A-fib [facility])    Cataract    Closed left arm fracture 2016   Colon polyps    Diverticulosis    Endometrial hyperplasia without atypia, simple    Endometrial polyp    Fatty liver    GERD (gastroesophageal reflux disease)    Hiatal hernia    Hyperplastic colon polyp    Hypertension    Lipoma    Obesity    Optic neuropathy of both eyes 1989, 1997   SOB (shortness of breath)    Thrombocytosis     Current Outpatient Medications:    ALPRAZolam (XANAX) 0.25 MG tablet, Take 0.5-1 tablets (0.125-0.25 mg total) by mouth daily as needed for anxiety., Disp: 30 tablet, Rfl: 0   Cholecalciferol (VITAMIN D3) 2000 UNITS TABS, Take 2,000 Units by mouth daily. , Disp: ,  Rfl:    dorzolamide-timolol (COSOPT) 22.3-6.8 MG/ML ophthalmic solution, , Disp: , Rfl:    glucose blood (ONE TOUCH ULTRA TEST) test strip, CHECK BLOOD SUGAR ONCE DAILY, Disp: 100 each, Rfl: 2   hydrochlorothiazide (HYDRODIURIL) 12.5 MG tablet, Take 1 tablet (12.5 mg total) by mouth daily., Disp: 90 tablet, Rfl: 2   hydroxyurea (HYDREA) 500 MG capsule, Take one tablet by mouth on Monday and Thursday (Patient not taking: Reported on 05/22/2021), Disp: 12 capsule, Rfl: 6   meclizine (ANTIVERT) 12.5 MG tablet, Take 1 tablet (12.5 mg total) by mouth as needed. Take as needed for  vertigo/ dizziness, Disp: 30 tablet, Rfl: 0   metoprolol succinate (TOPROL-XL) 25 MG 24 hr tablet, Take 1 tablet (25 mg total) by mouth daily., Disp: 90 tablet, Rfl: 3   Multiple Vitamins-Minerals (EYE VITAMINS PO), Take 1 tablet by mouth daily., Disp: , Rfl:    omeprazole (PRILOSEC) 20 MG capsule, TAKE 1 CAPSULE BY MOUTH  DAILY, Disp: 90 capsule, Rfl: 3   Polyethyl Glycol-Propyl Glycol (SYSTANE OP), Apply 1-2 drops to eye 4 (four) times daily as needed (dry eyes)., Disp: , Rfl:    Rivaroxaban (XARELTO) 15 MG TABS tablet, Take 1 tablet (15 mg total) by mouth daily with supper., Disp: 30 tablet, Rfl: 11   sertraline (ZOLOFT) 50 MG tablet, Take 1 tablet (50 mg  total) by mouth daily., Disp: 30 tablet, Rfl: 5 Social History   Socioeconomic History   Marital status: Widowed    Spouse name: Not on file   Number of children: Not on file   Years of education: Not on file   Highest education level: Not on file  Occupational History   Occupation: [employer]: RETIRED    Comment: Retired  Tobacco Use   Smoking status: Former    Types: Cigarettes    Quit date: 12/07/1982    Years since quitting: 38.5   Smokeless tobacco: Never  Vaping Use   Vaping Use: Never used  Substance and Sexual Activity   Alcohol use: No   Drug use: No   Sexual activity: Never  Other Topics Concern   Not on file   Social History Narrative   Not on file   Social Determinants of Health   Financial Resource Strain: Not on file  Food Insecurity: Not on file  Transportation Needs: Not on file  Physical Activity: Not on file  Stress: Not on file  Social Connections: Not on file  Intimate Partner Violence: Not on file   Family History  Problem Relation Age of Onset   Diabetes Mother    Diabetes Sister    Stroke Father    Diabetes Brother    Heart disease Brother    Cancer Sister        lung   Cancer Sister        vulva   Cancer Sister    Dementia Sister    Osteoporosis Sister    Diabetes Sister    Osteoporosis Sister    Diabetes Sister    Cancer Brother        bladder   Colon cancer Neg Hx     Objective: Office vital signs reviewed. BP (!) 147/69   Temp 97.8 F (36.6 C)   Wt 150 lb (68 kg)   SpO2 95%   BMI 27.44 kg/m   Physical Examination:  General: Awake, alert, well nourished, No acute distress Cardio: Bradycardic with seemingly regular rhythm today, S1S2 heard, no murmurs appreciated Pulm: clear to auscultation bilaterally, no wheezes, rhonchi or rales; normal work of breathing on room air Extremities: warm, well perfused, Non pitting edema to ankles, no cyanosis or clubbing; +2 pulses bilaterally MSK: Ambulates with use of cane  Assessment/ Plan: 87 y.o. female   Essential hypertension  Stage 3b chronic kidney disease ([place]) - Plan: CBC, Renal Function Panel  Thoracic aortic atherosclerosis (HCC)  PAF (paroxysmal atrial fibrillation) (HCC)  Essential thrombocytosis (HCC)  Blood pressure remains in acceptable range for age despite reduction in hydrochlorothiazide.  We will recheck her renal function panel, CBC today.  Not treated with cholesterol lowering medications.  Given age and impaired mobility, would be hesitant to start medications at this point  Rate controlled.  No bleeding concerns.  Check CBC  No longer taking the Hydrea prescribed by her  specialist.  Will CC platelets and today's exam to her specialist  No orders of the defined types were placed in this encounter.  No orders of the defined types were placed in this encounter.    [name], DO [place] [phone]

## 2021-07-01 NOTE — Patient Instructions (Signed)
You had labs performed today.  You will be contacted with the results of the labs once they are available, usually in the next 3 business days for routine lab work.  If you have an active my chart account, they will be released to your MyChart.  If you prefer to have these labs released to you via telephone, please let us know.   As we discussed, Spike protein has no bearing on "immunity".  It is a marker that lets patients know whether they have ever been exposed either by previous COVID infection OR COVID vaccination.  Presence does NOT equate immunity to COVID 19 infection.

## 2021-07-02 LAB — RENAL FUNCTION PANEL
Albumin: 4.6 g/dL (ref 3.5–4.6)
BUN/Creatinine Ratio: 16 (ref 12–28)
BUN: 21 mg/dL (ref 10–36)
CO2: 28 mmol/L (ref 20–29)
Calcium: 10.2 mg/dL (ref 8.7–10.3)
Chloride: 102 mmol/L (ref 96–106)
Creatinine, Ser: 1.35 mg/dL — ABNORMAL HIGH (ref 0.57–1.00)
Glucose: 102 mg/dL — ABNORMAL HIGH (ref 65–99)
Phosphorus: 3.9 mg/dL (ref 3.0–4.3)
Potassium: 4.6 mmol/L (ref 3.5–5.2)
Sodium: 142 mmol/L (ref 134–144)
eGFR: 37 mL/min/{1.73_m2} — ABNORMAL LOW (ref >59)

## 2021-07-02 LAB — CBC
Hematocrit: 41 % (ref 34.0–46.6)
Hemoglobin: 13.4 g/dL (ref 11.1–15.9)
MCH: 28.2 pg (ref 26.6–33.0)
MCHC: 32.7 g/dL (ref 31.5–35.7)
MCV: 86 fL (ref 79–97)
Platelets: 483 10*3/uL — ABNORMAL HIGH (ref 150–450)
RBC: 4.76 x10E6/uL (ref 3.77–5.28)
RDW: 14.4 % (ref 11.7–15.4)
WBC: 6.7 10*3/uL (ref 3.4–10.8)

## 2021-07-17 ENCOUNTER — Other Ambulatory Visit: Payer: Self-pay | Admitting: Family Medicine

## 2021-07-17 DIAGNOSIS — F330 Major depressive disorder, recurrent, mild: Secondary | ICD-10-CM

## 2021-08-04 ENCOUNTER — Other Ambulatory Visit: Payer: Self-pay

## 2021-08-04 ENCOUNTER — Other Ambulatory Visit: Payer: Medicare Other

## 2021-08-04 ENCOUNTER — Other Ambulatory Visit (HOSPITAL_COMMUNITY): Payer: Self-pay | Admitting: *Deleted

## 2021-08-04 DIAGNOSIS — Z1589 Genetic susceptibility to other disease: Secondary | ICD-10-CM

## 2021-08-04 DIAGNOSIS — D473 Essential (hemorrhagic) thrombocythemia: Secondary | ICD-10-CM | POA: Diagnosis not present

## 2021-08-06 ENCOUNTER — Telehealth: Payer: Self-pay | Admitting: Family Medicine

## 2021-08-06 NOTE — Progress Notes (Signed)
Virtual Visit via Telephone Note [facility]  I connected with Kristy [name]  on 08/07/21  at  1:44 PM  by telephone and verified that I am speaking with the correct person using two identifiers.  Location: Patient: Home Provider: [facility]   I discussed the limitations, risks, security and privacy concerns of performing an evaluation and management service by telephone and the availability of in person appointments. I also discussed with the patient that there may be a patient responsible charge related to this service. The patient expressed understanding and agreed to proceed.   HISTORY OF PRESENT ILLNESS: [name] 87 y.o. female follows at our clinic for clinic for JAK2 positive essential thrombocytosis.  She was last seen by Dr. Delton Coombes on 05/22/2021.  She was taking Hydrea twice weekly, but stopped it about 4 weeks ago due to dime-size blisters on her face and legs.  She reports that the blisters on her face left after she stopped the Hydrea.  She has not had any new blisters since she stopped they Hydrea.  No mouth sores or GI side effects.  She previously had concerns that hallucinations and fatigue were related to Hydrea, but they did not improve after she temporarily stopped Hydrea.  She is still taking the aspirin 81 mg daily as well as Xarelto, and has not noted any bleeding such as hematochezia, melena, or epistaxis.  She has intermittent fatigue, which she believes is related to her advanced age.  She reports that her energy today is "pretty good."  She denies erythromelalgia, but does have intermittent numbness in her toes that gets better when she massages them or soak them in warm water.  She denies aquagenic pruritus and vasomotor symptoms such as headaches and dizziness.  She has chronic blurry vision secondary to glaucoma and macular degeneration.  She denies any current signs or symptoms of blood clots, no history of DVT or PE.  She denies  any B symptoms such as fever, chills, night sweats, unintentional weight loss.  She reports that she has fair energy (80% today) and 100% appetite.  She is maintaining a stable weight at this time.    OBSERVATIONS/OBJECTIVE: Review of Systems  Constitutional:  Positive for malaise/fatigue (intermittent fatigue). Negative for chills, diaphoresis, fever and weight loss.  Respiratory:  Negative for cough and shortness of breath.   Cardiovascular:  Negative for chest pain and palpitations.  Gastrointestinal:  Negative for abdominal pain, blood in stool, melena, nausea and vomiting.  Neurological:  Negative for dizziness and headaches.    PHYSICAL EXAM (per limitations of virtual telephone visit): The patient is alert and oriented x 3, exhibiting adequate mentation, good mood, and ability to speak in full sentences and execute sound judgement.   ASSESSMENT & PLAN: 1.  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  She reports the blisters resolved and has not recurred after she stopped Hydrea.  - Physical examination (at last appointment) did not reveal any palpable splenomegaly. - Most recent labs (08/04/2021): Platelets 481, otherwise normal CBC - PLAN: Discussed with patient the risk of stopping Hydrea includes increasing platelets  causing CVA, MI, or VTE.  Patient verbalizes understanding, and reports that she would like to hold off on Hydrea for now due to side effects.  We will continue to monitor closely with repeat CBC and office visit in 2 months.  2.  Atrial fibrillation: - Xarelto for stroke  prophylaxis. - No bleeding issues reported     FOLLOW UP INSTRUCTIONS: -Labs and RTC in 2 months    I discussed the assessment and treatment plan with the patient. The patient was provided an opportunity to ask questions and all were answered. The patient agreed with the plan and demonstrated an understanding of the instructions.   The patient was advised to call back or seek an in-person evaluation if the symptoms worsen or if the condition fails to improve as anticipated.  I provided 21 minutes of non-face-to-face time during this encounter.   Harriett Rush, PA-C 08/07/21 2:10 PM

## 2021-08-06 NOTE — Telephone Encounter (Signed)
Noted - pt hasn't been drawn yet

## 2021-08-06 NOTE — Telephone Encounter (Signed)
[name] at [facility] needs [place] to fax labs from 8/29 when the results are back. Please fax to [phone]

## 2021-08-07 ENCOUNTER — Other Ambulatory Visit: Payer: Self-pay

## 2021-08-07 ENCOUNTER — Inpatient Hospital Stay (HOSPITAL_COMMUNITY): Payer: Medicare Other | Attending: Hematology | Admitting: Physician Assistant

## 2021-08-07 ENCOUNTER — Encounter (HOSPITAL_COMMUNITY): Payer: Self-pay | Admitting: Physician Assistant

## 2021-08-07 DIAGNOSIS — D473 Essential (hemorrhagic) thrombocythemia: Secondary | ICD-10-CM

## 2021-08-07 DIAGNOSIS — Z1589 Genetic susceptibility to other disease: Secondary | ICD-10-CM | POA: Diagnosis not present

## 2021-09-23 ENCOUNTER — Ambulatory Visit: Payer: Medicare Other | Admitting: Family Medicine

## 2021-09-24 ENCOUNTER — Telehealth: Payer: Self-pay | Admitting: Family Medicine

## 2021-09-24 NOTE — Telephone Encounter (Signed)
LVM for pt to rtn my call to schedule AWV with [facility].  

## 2021-10-03 ENCOUNTER — Ambulatory Visit (INDEPENDENT_AMBULATORY_CARE_PROVIDER_SITE_OTHER): Payer: Medicare Other

## 2021-10-03 VITALS — Ht 62.0 in | Wt 150.0 lb

## 2021-10-03 DIAGNOSIS — Z0001 Encounter for general adult medical examination with abnormal findings: Secondary | ICD-10-CM | POA: Diagnosis not present

## 2021-10-03 DIAGNOSIS — Z741 Need for assistance with personal care: Secondary | ICD-10-CM | POA: Diagnosis not present

## 2021-10-03 DIAGNOSIS — F410 Panic disorder [episodic paroxysmal anxiety] without agoraphobia: Secondary | ICD-10-CM | POA: Diagnosis not present

## 2021-10-03 DIAGNOSIS — Z0000 Encounter for general adult medical examination without abnormal findings: Secondary | ICD-10-CM

## 2021-10-03 DIAGNOSIS — N8501 Benign endometrial hyperplasia: Secondary | ICD-10-CM | POA: Insufficient documentation

## 2021-10-03 DIAGNOSIS — Z602 Problems related to living alone: Secondary | ICD-10-CM | POA: Diagnosis not present

## 2021-10-03 NOTE — Patient Instructions (Signed)
Kristy [name] , Thank you for taking time to come for your Medicare Wellness Visit. I appreciate your ongoing commitment to your health goals. Please review the following plan we discussed and let me know if I can assist you in the future.   Screening recommendations/referrals: Colonoscopy: Done 03/20/2011 - no repeat Mammogram: Done 2014 - no repeat required Bone Density: Declined Recommended yearly ophthalmology/optometry visit for glaucoma screening and checkup Recommended yearly dental visit for hygiene and checkup  Vaccinations: Influenza vaccine: Due. Every fall Pneumococcal vaccine: Done 12/07/1996 & 09/29/2013 Tdap vaccine: Done 03/06/2010 - Repeat in 10 years *due Shingles vaccine: Zostavax done 2007 - due for shingrix   Covid-19: Declined  Advanced directives: Please bring a copy of your health care power of attorney and living will to the office to be added to your chart at your convenience.   Conditions/risks identified: Aim for 10 minutes of exercise or brisk walking each day, remember to always use your walker, drink 6-8 glasses of water and eat lots of fruits and vegetables.   Next appointment: Follow up in one year for your annual wellness visit    Preventive Care 65 Years and Older, Female Preventive care refers to lifestyle choices and visits with your health care provider that can promote health and wellness. What does preventive care include? A yearly physical exam. This is also called an annual well check. Dental exams once or twice a year. Routine eye exams. Ask your health care provider how often you should have your eyes checked. Personal lifestyle choices, including: Daily care of your teeth and gums. Regular physical activity. Eating a healthy diet. Avoiding tobacco and drug use. Limiting alcohol use. Practicing safe sex. Taking low-dose aspirin every day. Taking vitamin and mineral supplements as recommended by your health care provider. What happens during  an annual well check? The services and screenings done by your health care provider during your annual well check will depend on your age, overall health, lifestyle risk factors, and family history of disease. Counseling  Your health care provider may ask you questions about your: Alcohol use. Tobacco use. Drug use. Emotional well-being. Home and relationship well-being. Sexual activity. Eating habits. History of falls. Memory and ability to understand (cognition). Work and work Statistician. Reproductive health. Screening  You may have the following tests or measurements: Height, weight, and BMI. Blood pressure. Lipid and cholesterol levels. These may be checked every 5 years, or more frequently if you are over 29 years old. Skin check. Lung cancer screening. You may have this screening every year starting at age 24 if you have a 30-pack-year history of smoking and currently smoke or have quit within the past 15 years. Fecal occult blood test (FOBT) of the stool. You may have this test every year starting at age 23. Flexible sigmoidoscopy or colonoscopy. You may have a sigmoidoscopy every 5 years or a colonoscopy every 10 years starting at age 75. Hepatitis C blood test. Hepatitis B blood test. Sexually transmitted disease (STD) testing. Diabetes screening. This is done by checking your blood sugar (glucose) after you have not eaten for a while (fasting). You may have this done every 1-3 years. Bone density scan. This is done to screen for osteoporosis. You may have this done starting at age 76. Mammogram. This may be done every 1-2 years. Talk to your health care provider about how often you should have regular mammograms. Talk with your health care provider about your test results, treatment options, and if necessary, the need  for more tests. Vaccines  Your health care provider may recommend certain vaccines, such as: Influenza vaccine. This is recommended every year. Tetanus,  diphtheria, and acellular pertussis (Tdap, Td) vaccine. You may need a Td booster every 10 years. Zoster vaccine. You may need this after age 64. Pneumococcal 13-valent conjugate (PCV13) vaccine. One dose is recommended after age 89. Pneumococcal polysaccharide (PPSV23) vaccine. One dose is recommended after age 26. Talk to your health care provider about which screenings and vaccines you need and how often you need them. This information is not intended to replace advice given to you by your health care provider. Make sure you discuss any questions you have with your health care provider. Document Released: 12/20/2015 Document Revised: 08/12/2016 Document Reviewed: 09/24/2015 Elsevier Interactive Patient Education  2017 [place] Prevention in the Home Falls can cause injuries. They can happen to people of all ages. There are many things you can do to make your home safe and to help prevent falls. What can I do on the outside of my home? Regularly fix the edges of walkways and driveways and fix any cracks. Remove anything that might make you trip as you walk through a door, such as a raised step or threshold. Trim any bushes or trees on the path to your home. Use bright outdoor lighting. Clear any walking paths of anything that might make someone trip, such as rocks or tools. Regularly check to see if handrails are loose or broken. Make sure that both sides of any steps have handrails. Any raised decks and porches should have guardrails on the edges. Have any leaves, snow, or ice cleared regularly. Use sand or salt on walking paths during winter. Clean up any spills in your garage right away. This includes oil or grease spills. What can I do in the bathroom? Use night lights. Install grab bars by the toilet and in the tub and shower. Do not use towel bars as grab bars. Use non-skid mats or decals in the tub or shower. If you need to sit down in the shower, use a plastic,  non-slip stool. Keep the floor dry. Clean up any water that spills on the floor as soon as it happens. Remove soap buildup in the tub or shower regularly. Attach bath mats securely with double-sided non-slip rug tape. Do not have throw rugs and other things on the floor that can make you trip. What can I do in the bedroom? Use night lights. Make sure that you have a light by your bed that is easy to reach. Do not use any sheets or blankets that are too big for your bed. They should not hang down onto the floor. Have a firm chair that has side arms. You can use this for support while you get dressed. Do not have throw rugs and other things on the floor that can make you trip. What can I do in the kitchen? Clean up any spills right away. Avoid walking on wet floors. Keep items that you use a lot in easy-to-reach places. If you need to reach something above you, use a strong step stool that has a grab bar. Keep electrical cords out of the way. Do not use floor polish or wax that makes floors slippery. If you must use wax, use non-skid floor wax. Do not have throw rugs and other things on the floor that can make you trip. What can I do with my stairs? Do not leave any items on the stairs.  Make sure that there are handrails on both sides of the stairs and use them. Fix handrails that are broken or loose. Make sure that handrails are as long as the stairways. Check any carpeting to make sure that it is firmly attached to the stairs. Fix any carpet that is loose or worn. Avoid having throw rugs at the top or bottom of the stairs. If you do have throw rugs, attach them to the floor with carpet tape. Make sure that you have a light switch at the top of the stairs and the bottom of the stairs. If you do not have them, ask someone to add them for you. What else can I do to help prevent falls? Wear shoes that: Do not have high heels. Have rubber bottoms. Are comfortable and fit you well. Are closed  at the toe. Do not wear sandals. If you use a stepladder: Make sure that it is fully opened. Do not climb a closed stepladder. Make sure that both sides of the stepladder are locked into place. Ask someone to hold it for you, if possible. Clearly mark and make sure that you can see: Any grab bars or handrails. First and last steps. Where the edge of each step is. Use tools that help you move around (mobility aids) if they are needed. These include: Canes. Walkers. Scooters. Crutches. Turn on the lights when you go into a dark area. Replace any light bulbs as soon as they burn out. Set up your furniture so you have a clear path. Avoid moving your furniture around. If any of your floors are uneven, fix them. If there are any pets around you, be aware of where they are. Review your medicines with your doctor. Some medicines can make you feel dizzy. This can increase your chance of falling. Ask your doctor what other things that you can do to help prevent falls. This information is not intended to replace advice given to you by your health care provider. Make sure you discuss any questions you have with your health care provider. Document Released: 09/19/2009 Document Revised: 04/30/2016 Document Reviewed: 12/28/2014 Elsevier Interactive Patient Education  2017 [employer].

## 2021-10-03 NOTE — Progress Notes (Signed)
Subjective:   Kristy [name] is a 87 y.o. female who presents for Medicare Annual (Subsequent) preventive examination.  Virtual Visit via Telephone Note  I connected with  Kristy [name] on 10/03/21 at 10:30 AM EDT by telephone and verified that I am speaking with the correct person using two identifiers.  Location: Patient: Home Provider: [facility] Persons participating in the virtual visit: patient/Nurse Health Advisor   I discussed the limitations, risks, security and privacy concerns of performing an evaluation and management service by telephone and the availability of in person appointments. The patient expressed understanding and agreed to proceed.  Interactive audio and video telecommunications were attempted between this nurse and patient, however failed, due to patient having technical difficulties OR patient did not have access to video capability.  We continued and completed visit with audio only.  Some vital signs may be absent or patient reported.   Raheen Capili E Franci Oshana, LPN   Review of Systems     Cardiac Risk Factors include: advanced age (>23men, >26 women);sedentary lifestyle;hypertension;Other (see comment), Risk factor comments: A.Fib, atherosclerosis, thrombocytosis     Objective:    Today's Vitals   10/03/21 1032 10/03/21 1034  Weight: 150 lb (68 kg)   Height: 5\' 2"  (1.575 m)   PainSc:  2    Body mass index is 27.44 kg/m.  Advanced Directives 10/03/2021 08/07/2021 05/22/2021 10/02/2020 04/03/2020 05/16/2019 12/13/2018  Does Patient Have a Medical Advance Directive? Yes No No Yes Yes Yes No  Type of Paramedic of [place];Living will - - [place];Living will Living will;Healthcare Power of Attorney Living will;Healthcare Power of Attorney -  Does patient want to make changes to medical advance directive? - - - No - Patient declined No - Patient declined No - Patient declined -  Copy of Tensed in Chart? No -  copy requested - - No - copy requested - No - copy requested -  Would patient like information on creating a medical advance directive? - No - Patient declined No - Patient declined - - - -    Current Medications (verified) Outpatient Encounter Medications as of 10/03/2021  Medication Sig   ALPRAZolam (XANAX) 0.25 MG tablet Take 0.5-1 tablets (0.125-0.25 mg total) by mouth daily as needed for anxiety.   Cholecalciferol (VITAMIN D3) 2000 UNITS TABS Take 4,000 Units by mouth daily.   dorzolamide-timolol (COSOPT) 22.3-6.8 MG/ML ophthalmic solution    hydrochlorothiazide (HYDRODIURIL) 12.5 MG tablet Take 1 tablet (12.5 mg total) by mouth daily.   hydroxyurea (HYDREA) 500 MG capsule Take one tablet by mouth on Monday and Thursday   meclizine (ANTIVERT) 12.5 MG tablet Take 1 tablet (12.5 mg total) by mouth as needed. Take as needed for  vertigo/ dizziness (Patient taking differently: Take 12.5 mg by mouth as needed. Take as needed for  vertigo/ dizziness)   metoprolol succinate (TOPROL-XL) 25 MG 24 hr tablet Take 1 tablet (25 mg total) by mouth daily.   Multiple Vitamins-Minerals (EYE VITAMINS PO) Take 1 tablet by mouth daily.   omeprazole (PRILOSEC) 20 MG capsule TAKE 1 CAPSULE BY MOUTH  DAILY   Polyethyl Glycol-Propyl Glycol (SYSTANE OP) Apply 1-2 drops to eye 4 (four) times daily as needed (dry eyes).   Rivaroxaban (XARELTO) 15 MG TABS tablet Take 1 tablet (15 mg total) by mouth daily with supper.   sertraline (ZOLOFT) 50 MG tablet Take 1 tablet (50 mg total) by mouth daily.   [DISCONTINUED] glucose blood (ONE TOUCH ULTRA  TEST) test strip CHECK BLOOD SUGAR ONCE DAILY (Patient not taking: Reported on 10/03/2021)   No facility-administered encounter medications on file as of 10/03/2021.    Allergies (verified) Codeine, Latanoprost, and Tramadol   History: Past Medical History:  Diagnosis Date   A-fib ([place])    Cataract    Closed left arm fracture 2016   Colon polyps    Diverticulosis     Endometrial hyperplasia without atypia, simple    Endometrial polyp    Fatty liver    GERD (gastroesophageal reflux disease)    Hiatal hernia    Hyperplastic colon polyp    Hypertension    Lipoma    Obesity    Optic neuropathy of both eyes 1989, 1997   SOB (shortness of breath)    Thrombocytosis    Past Surgical History:  Procedure Laterality Date   breast biopsy     CATARACT EXTRACTION     CHOLECYSTECTOMY  03/2002   DILATION AND CURETTAGE OF UTERUS     x 2   EYE SURGERY     HERNIA REPAIR     KNEE SURGERY     optic neuritis     umbilical hernia repair  09/2002   Family History  Problem Relation Age of Onset   Diabetes Mother    Diabetes Sister    Stroke Father    Diabetes Brother    Heart disease Brother    Cancer Sister        lung   Cancer Sister        vulva   Cancer Sister    Dementia Sister    Osteoporosis Sister    Diabetes Sister    Osteoporosis Sister    Diabetes Sister    Cancer Brother        bladder   Colon cancer Neg Hx    Social History   Socioeconomic History   Marital status: Widowed    Spouse name: Not on file   Number of children: 2   Years of education: Not on file   Highest education level: Not on file  Occupational History   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    Employer: RETIRED    Comment: Retired  Tobacco Use   Smoking status: Former    Types: Cigarettes    Quit date: 12/07/1982    Years since quitting: 38.8   Smokeless tobacco: Never  Vaping Use   Vaping Use: Never used  Substance and Sexual Activity   Alcohol use: No   Drug use: No   Sexual activity: Never  Other Topics Concern   Not on file  Social History Narrative   Son in [place]   Daughter in [place] Strain: Low Risk    Difficulty of Paying Living Expenses: Not hard at all  Food Insecurity: No Food Insecurity   Worried About Charity fundraiser in the Last Year: Never true   Arboriculturist  in the Last Year: Never true  Transportation Needs: No Transportation Needs   Lack of Transportation (Medical): No   Lack of Transportation (Non-Medical): No  Physical Activity: Insufficiently Active   Days of Exercise per Week: 7 days   Minutes of Exercise per Session: 10 min  Stress: Stress Concern Present   Feeling of Stress : To some extent  Social Connections: Moderately Integrated   Frequency of Communication with Friends and Family: More than three times a week   Frequency  of Social Gatherings with Friends and Family: Twice a week   Attends Religious Services: More than 4 times per year   Active Member of [employer] or Organizations: Yes   Attends Archivist Meetings: 1 to 4 times per year   Marital Status: Widowed    Tobacco Counseling Counseling given: Not Answered   Clinical Intake:  Pre-visit preparation completed: Yes  Pain : 0-10 Pain Score: 2  Pain Type: Chronic pain Pain Location: Arm Pain Orientation: Right, Left Pain Descriptors / Indicators: Aching, Discomfort Pain Onset: More than a month ago Pain Frequency: Intermittent     BMI - recorded: 27.44 Nutritional Status: BMI 25 -29 Overweight Nutritional Risks: None Diabetes: No  How often do you need to have someone help you when you read instructions, pamphlets, or other written materials from your doctor or pharmacy?: 1 - Never  Diabetic? no  Interpreter Needed?: No  Information entered by :: Kennethia Lynes, LPN   Activities of Daily Living In your present state of health, do you have any difficulty performing the following activities: 10/03/2021  Hearing? Y  Vision? N  Difficulty concentrating or making decisions? N  Walking or climbing stairs? Y  Dressing or bathing? N  Doing errands, shopping? Y  Preparing Food and eating ? N  Using the Toilet? N  In the past six months, have you accidently leaked urine? Y  Do you have problems with loss of bowel control? Y  Managing your  Medications? N  Managing your Finances? N  Housekeeping or managing your Housekeeping? Y  Some recent data might be hidden    Patient Care Team: [name], DO as PCP - General (Family Medicine) Minus Breeding, MD as PCP - Cardiology (Cardiology) Minus Breeding, MD as Consulting Physician (Cardiology) Clent Jacks, MD as Consulting Physician (Ophthalmology) Derek Jack, MD as Consulting Physician (Hematology)  Indicate any recent Medical Services you may have received from other than [facility] providers in the past year (date may be approximate).     Assessment:   This is a routine wellness examination for Kristy [name].  Hearing/Vision screen Hearing Screening - Comments:: Wears hearing aids - from [facility] Vision Screening - Comments:: Wears rx glasses - up to date with annual eye exams with Dr Katy Fitch  Dietary issues and exercise activities discussed: Current Exercise Habits: Home exercise routine, Type of exercise: walking, Time (Minutes): 10, Frequency (Times/Week): 7, Weekly Exercise (Minutes/Week): 70, Intensity: Mild, Exercise limited by: cardiac condition(s)   Goals Addressed             This Visit's Progress    Exercise 3x per week (30 min per time)   Not on track      Depression Screen [facility] 2/9 Scores 10/03/2021 07/01/2021 04/01/2021 08/22/2020 03/11/2020 11/24/2019 08/23/2019  PHQ - 2 Score 1 1 2 2 4 6  0  PHQ- 9 Score - 6 7 7 9 10  -    Fall Risk Fall Risk  10/03/2021 07/01/2021 04/01/2021 08/22/2020 08/22/2020  Falls in the past year? 1 1 1 1  0  Number falls in past yr: 1 0 1 1 -  Injury with Fall? 1 0 1 0 -  Risk Factor Category  - - - - -  Risk for fall due to : Impaired vision;Medication side effect;Impaired balance/gait;History of fall(s);Orthopedic patient History of fall(s);Impaired balance/gait Impaired mobility;Impaired balance/gait Impaired balance/gait;History of fall(s) -  Risk for fall due to: Comment - - - dizziness -  Follow up Falls prevention  discussed Falls prevention  discussed Falls evaluation completed Falls evaluation completed -  Comment - - - - -    FALL RISK PREVENTION PERTAINING TO THE HOME:  Any stairs in or around the home? Yes  If so, are there any without handrails? No  Home free of loose throw rugs in walkways, pet beds, electrical cords, etc? Yes  Adequate lighting in your home to reduce risk of falls? Yes   ASSISTIVE DEVICES UTILIZED TO PREVENT FALLS:  Life alert? Yes  Use of a cane, walker or w/c? Yes  Grab bars in the bathroom? Yes  Shower chair or bench in shower? Yes  Elevated toilet seat or a handicapped toilet? Yes   TIMED UP AND GO:  Was the test performed? No . Telephonic visit  Cognitive Function: Normal cognitive status assessed by direct observation by this Nurse Health Advisor. No abnormalities found.    MMSE - Mini Mental State Exam 11/24/2019 11/24/2019 07/07/2016 03/06/2015  Orientation to time 5 5 5 5   Orientation to Place 5 5 5 5   Registration 3 3 3 3   Attention/ Calculation 5 - 5 5  Recall 3 - 3 3  Language- name 2 objects 2 - 2 2  Language- repeat 1 - 1 1  Language- follow 3 step command 3 - 3 3  Language- read & follow direction 1 - 1 1  Write a sentence 1 - 1 1  Copy design 1 - 1 1  Total score 30 - 30 30     6CIT Screen 10/03/2021  What Year? 0 points  What month? 0 points  What time? 0 points  Count back from 20 0 points  Months in reverse 2 points  Repeat phrase 4 points  Total Score 6    Immunizations Immunization History  Administered Date(s) Administered   Influenza, High Dose Seasonal PF 09/14/2016, 09/24/2017, 09/26/2018   Influenza,inj,Quad PF,6+ Mos 09/29/2013, 09/24/2014, 09/16/2015, 09/12/2019   Pneumococcal Conjugate-13 09/29/2013   Pneumococcal Polysaccharide-23 12/07/1996   Tdap 03/06/2010   Zoster, Live 06/06/2006    TDAP status: Due, Education has been provided regarding the importance of this vaccine. Advised may receive this vaccine at local  pharmacy or Health Dept. Aware to provide a copy of the vaccination record if obtained from local pharmacy or Health Dept. Verbalized acceptance and understanding.  Flu Vaccine status: Due, Education has been provided regarding the importance of this vaccine. Advised may receive this vaccine at local pharmacy or Health Dept. Aware to provide a copy of the vaccination record if obtained from local pharmacy or Health Dept. Verbalized acceptance and understanding.  Pneumococcal vaccine status: Up to date  Covid-19 vaccine status: Declined, Education has been provided regarding the importance of this vaccine but patient still declined. Advised may receive this vaccine at local pharmacy or Health Dept.or vaccine clinic. Aware to provide a copy of the vaccination record if obtained from local pharmacy or Health Dept. Verbalized acceptance and understanding.  Qualifies for Shingles Vaccine? Yes   Zostavax completed Yes   Shingrix Completed?: No.    Education has been provided regarding the importance of this vaccine. Patient has been advised to call insurance company to determine out of pocket expense if they have not yet received this vaccine. Advised may also receive vaccine at local pharmacy or Health Dept. Verbalized acceptance and understanding.  Screening Tests Health Maintenance  Topic Date Due   COVID-19 Vaccine (1) Never done   Zoster Vaccines- Shingrix (1 of 2) Never done   INFLUENZA VACCINE  07/07/2021  TETANUS/TDAP  07/01/2022 (Originally 03/06/2020)   Pneumonia Vaccine 30+ Years old  Completed   DEXA SCAN  Completed   HPV VACCINES  Aged Out    Health Maintenance  Health Maintenance Due  Topic Date Due   COVID-19 Vaccine (1) Never done   Zoster Vaccines- Shingrix (1 of 2) Never done   INFLUENZA VACCINE  07/07/2021    Colorectal cancer screening: No longer required.   Mammogram status: No longer required due to age.  Bone Density scan: declined  Lung Cancer Screening: (Low  Dose CT Chest recommended if Age 82-80 years, 30 pack-year currently smoking OR have quit w/in 15years.) does not qualify.   Additional Screening:  Hepatitis C Screening: does not qualify  Vision Screening: Recommended annual ophthalmology exams for early detection of glaucoma and other disorders of the eye. Is the patient up to date with their annual eye exam?  Yes  Who is the provider or what is the name of the office in which the patient attends annual eye exams? [facility] If pt is not established with a provider, would they like to be referred to a provider to establish care? No .   Dental Screening: Recommended annual dental exams for proper oral hygiene  Community Resource Referral / Chronic Care Management: CRR required this visit?  No   CCM required this visit?  No      Plan:     I have personally reviewed and noted the following in the patient's chart:   Medical and social history Use of alcohol, tobacco or illicit drugs  Current medications and supplements including opioid prescriptions.  Functional ability and status Nutritional status Physical activity Advanced directives List of other physicians Hospitalizations, surgeries, and ER visits in previous 12 months Vitals Screenings to include cognitive, depression, and falls Referrals and appointments  In addition, I have reviewed and discussed with patient certain preventive protocols, quality metrics, and best practice recommendations. A written personalized care plan for preventive services as well as general preventive health recommendations were provided to patient.     Sandrea Hammond, LPN   [id number]   Nurse Notes: CRR as requested by patient - she is lonely, depressed, anxious about living alone and it is getting increasingly difficult for her to do her own ADLs.

## 2021-10-08 NOTE — Progress Notes (Signed)
[place], [place] [address]   CLINIC:  Medical Oncology/Hematology  PCP:  [name], DO [place] [address] [phone]   REASON FOR VISIT:  Follow-up for JAK2 + essential thrombocytosis  PRIOR THERAPY: Hydrea  CURRENT THERAPY: None  INTERVAL HISTORY:  Kristy [name] 87 y.o. female returns for routine follow-up of her essential thrombocytosis.  She was last evaluated via telemedicine visit by Tarri Abernethy PA-C on 08/07/2021.  Patient stopped taking Hydrea of her own volition on 07/07/2021 due to blisters on her face and legs, which she reports resolved after stopping Hydrea.  At her telemedicine visit on 08/07/2021, she was adamant that she did not want to take Hydrea any longer and wanted to hold off on additional treatment of her thrombocytosis for the time being.  At today's visit, she reports feeling fairly well.  No recent hospitalizations, surgeries, or changes in baseline health status.  She does note that since she stopped taking her Hydrea 3 months ago she has been noticing more symptoms of dizziness and vertigo.  Otherwise, she denies any new symptoms.  No other vasomotor symptoms such as headaches, aquagenic pruritus, or erythromelalgia. She has chronic blurry vision secondary to glaucoma and macular degeneration. She denies any current signs or symptoms of blood clots, no history of DVT or PE. She denies any B symptoms such as fever, chills, night sweats, unintentional weight loss.  She does continue to have chronic dyspnea on exertion. She remains on aspirin 81 mg daily for her essential thrombocytosis as well as Xarelto for her atrial fibrillation.   She reports easy bruising.  No bleeding events such as hematemesis, hematochezia, melena, or epistaxis.    She has intermittent fatigue, which she believes is related to her advanced age, reports that her energy today is "good enough."  She has baseline  energy and 75% appetite. She has slowly lost about 10 pounds over the past 6 months, which she reports is due to not eating as much as she used to.   REVIEW OF SYSTEMS:  Review of Systems  Constitutional:  Positive for fatigue. Negative for appetite change, chills, diaphoresis, fever and unexpected weight change.  HENT:   Negative for lump/mass and nosebleeds.   Eyes:  Negative for eye problems.  Respiratory:  Positive for shortness of breath (with exertion). Negative for cough and hemoptysis.   Cardiovascular:  Negative for chest pain, leg swelling and palpitations.  Gastrointestinal:  Negative for abdominal pain, blood in stool, constipation, diarrhea, nausea and vomiting.  Genitourinary:  Negative for hematuria.   Skin: Negative.   Neurological:  Positive for dizziness. Negative for headaches and light-headedness.  Hematological:  Does not bruise/bleed easily.     PAST MEDICAL/SURGICAL HISTORY:  Past Medical History:  Diagnosis Date   A-fib [facility])    Cataract    Closed left arm fracture 2016   Colon polyps    Diverticulosis    Endometrial hyperplasia without atypia, simple    Endometrial polyp    Fatty liver    GERD (gastroesophageal reflux disease)    Hiatal hernia    Hyperplastic colon polyp    Hypertension    Lipoma    Obesity    Optic neuropathy of both eyes 1989, 1997   SOB (shortness of breath)    Thrombocytosis    Past Surgical History:  Procedure Laterality Date   breast biopsy     CATARACT EXTRACTION     CHOLECYSTECTOMY  03/2002   DILATION AND CURETTAGE OF UTERUS     x 2   EYE SURGERY     HERNIA REPAIR     KNEE SURGERY     optic neuritis     umbilical hernia repair  09/2002     SOCIAL HISTORY:  Social History   Socioeconomic History   Marital status: Widowed    Spouse name: Not on file   Number of children: 2   Years of education: Not on file   Highest education level: Not on file  Occupational History   Occupation: [employer]    Employer: RETIRED    Comment: Retired  Tobacco Use   Smoking status: Former    Types: Cigarettes    Quit date: 12/07/1982    Years since quitting: 38.8   Smokeless tobacco: Never  Vaping Use   Vaping Use: Never used  Substance and Sexual Activity   Alcohol use: No   Drug use: No   Sexual activity: Never  Other Topics Concern   Not on file  Social History Narrative   Son in [place]   Daughter in [place] Strain: Low Risk    Difficulty of Paying Living Expenses: Not hard at all  Food Insecurity: No Food Insecurity   Worried About Charity fundraiser in the Last Year: Never true   Arboriculturist in the Last Year: Never true  Transportation Needs: No Transportation Needs   Lack of Transportation (Medical): No   Lack of Transportation (Non-Medical): No  Physical Activity: Insufficiently Active   Days of Exercise per Week: 7 days   Minutes of Exercise per Session: 10 min  Stress: Stress Concern Present   Feeling of Stress : To some extent  Social Connections: Moderately Integrated   Frequency of Communication with Friends and Family: More than three times a week   Frequency of Social Gatherings with Friends and Family: Twice a week   Attends Religious Services: More than 4 times per year   Active Member of [employer] or Organizations: Yes   Attends Archivist Meetings: 1 to 4 times per year   Marital Status: Widowed  Human resources officer Violence: Not At Risk   Fear of Current or Ex-Partner: No   Emotionally Abused: No   Physically Abused: No   Sexually Abused: No    FAMILY HISTORY:  Family History  Problem Relation Age of Onset   Diabetes Mother    Diabetes Sister    Stroke Father    Diabetes Brother    Heart disease Brother    Cancer Sister        lung   Cancer Sister        vulva   Cancer Sister    Dementia Sister    Osteoporosis Sister    Diabetes Sister    Osteoporosis Sister     Diabetes Sister    Cancer Brother        bladder   Colon cancer Neg Hx     CURRENT MEDICATIONS:  Outpatient Encounter Medications as of 10/09/2021  Medication Sig   ALPRAZolam (XANAX) 0.25 MG tablet Take 0.5-1 tablets (0.125-0.25 mg total) by mouth daily as needed for anxiety.   Cholecalciferol (VITAMIN D3) 2000 UNITS TABS Take 4,000 Units by mouth daily.   dorzolamide-timolol (COSOPT) 22.3-6.8 MG/ML ophthalmic solution    hydrochlorothiazide (HYDRODIURIL) 12.5 MG tablet Take 1 tablet (12.5 mg total) by mouth daily.  hydroxyurea (HYDREA) 500 MG capsule Take one tablet by mouth on Monday and Thursday   meclizine (ANTIVERT) 12.5 MG tablet Take 1 tablet (12.5 mg total) by mouth as needed. Take as needed for  vertigo/ dizziness (Patient taking differently: Take 12.5 mg by mouth as needed. Take as needed for  vertigo/ dizziness)   metoprolol succinate (TOPROL-XL) 25 MG 24 hr tablet Take 1 tablet (25 mg total) by mouth daily.   Multiple Vitamins-Minerals (EYE VITAMINS PO) Take 1 tablet by mouth daily.   omeprazole (PRILOSEC) 20 MG capsule TAKE 1 CAPSULE BY MOUTH  DAILY   Polyethyl Glycol-Propyl Glycol (SYSTANE OP) Apply 1-2 drops to eye 4 (four) times daily as needed (dry eyes).   Rivaroxaban (XARELTO) 15 MG TABS tablet Take 1 tablet (15 mg total) by mouth daily with supper.   sertraline (ZOLOFT) 50 MG tablet Take 1 tablet (50 mg total) by mouth daily.   No facility-administered encounter medications on file as of 10/09/2021.    ALLERGIES:  Allergies  Allergen Reactions   Codeine Nausea Only   Latanoprost Other (See Comments)    Red and burning   Tramadol Nausea Only     PHYSICAL EXAM:  ECOG PERFORMANCE STATUS: 2 - Symptomatic, <50% confined to bed  There were no vitals filed for this visit. There were no vitals filed for this visit. Physical Exam Constitutional:      Appearance: Normal appearance.  HENT:     Head: Normocephalic and atraumatic.     Mouth/Throat:     Mouth:  Mucous membranes are moist.  Eyes:     Extraocular Movements: Extraocular movements intact.     Pupils: Pupils are equal, round, and reactive to light.  Cardiovascular:     Rate and Rhythm: Normal rate. Rhythm irregular.     Pulses: Normal pulses.     Heart sounds: Normal heart sounds.  Pulmonary:     Effort: Pulmonary effort is normal.     Breath sounds: Normal breath sounds.  Abdominal:     General: Bowel sounds are normal.     Palpations: Abdomen is soft.     Tenderness: There is no abdominal tenderness.  Musculoskeletal:        General: No swelling.     Right lower leg: No edema.     Left lower leg: No edema.  Lymphadenopathy:     Cervical: No cervical adenopathy.  Skin:    General: Skin is warm and dry.  Neurological:     General: No focal deficit present.     Mental Status: She is alert and oriented to person, place, and time.  Psychiatric:        Mood and Affect: Mood normal.        Behavior: Behavior normal.     LABORATORY DATA:  I have reviewed the labs as listed.  CBC    Component Value Date/Time   WBC 6.7 07/01/2021 1038   WBC 7.7 04/03/2020 0925   RBC 4.76 07/01/2021 1038   RBC 4.33 04/03/2020 0925   HGB 13.4 07/01/2021 1038   HCT 41.0 07/01/2021 1038   PLT 483 (H) 07/01/2021 1038   MCV 86 07/01/2021 1038   MCH 28.2 07/01/2021 1038   MCH 31.2 04/03/2020 0925   MCHC 32.7 07/01/2021 1038   MCHC 32.9 04/03/2020 0925   RDW 14.4 07/01/2021 1038   LYMPHSABS 0.9 05/19/2021 1052   MONOABS 0.8 04/03/2020 0925   EOSABS 0.1 05/19/2021 1052   BASOSABS 0.1 05/19/2021 1052   CMP  Latest Ref Rng & Units 07/01/2021 05/19/2021 02/04/2021  Glucose 65 - 99 mg/dL 102(H) 98 89  BUN 10 - 36 mg/dL 21 15 21   Creatinine 0.57 - 1.00 mg/dL 1.35(H) 1.28(H) 1.23(H)  Sodium 134 - 144 mmol/L 142 144 143  Potassium 3.5 - 5.2 mmol/L 4.6 4.8 4.5  Chloride 96 - 106 mmol/L 102 105 103  CO2 20 - 29 mmol/L 28 24 24   Calcium 8.7 - 10.3 mg/dL 10.2 9.9 10.0  Total Protein 6.0 - 8.5 g/dL  - 6.6 6.7  Total Bilirubin 0.0 - 1.2 mg/dL - 0.6 0.5  Alkaline Phos 44 - 121 IU/L - 70 77  AST 0 - 40 IU/L - 22 24  ALT 0 - 32 IU/L - 13 16    DIAGNOSTIC IMAGING:  I have independently reviewed the relevant imaging and discussed with the patient.  ASSESSMENT & PLAN: 1.  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  She stopped of her own volition in August 2022 due to blisters on her legs and face.  She reports the blisters resolved and has not recurred after she stopped Hydrea. - Physical examination (at last appointment) did not reveal any palpable splenomegaly.   - Symptomatic with dizziness that has been progressive after stopping her Hydrea - Most recent labs (10/09/2021) show increasing platelets at 530. - PLAN: Due to worsening dizziness ever since stopping Hydrea, patient is agreeable to restarting her Hydrea 500 mg twice weekly on Mondays and Thursdays - We will continue to monitor closely with repeat CBC and office visit in 2 months. - Patient instructed to notify us if she has any thromboembolic events such as DVT, PE, CVA, or MI.  2.  Atrial fibrillation: - Xarelto for stroke prophylaxis. - No bleeding issues reported     PLAN SUMMARY & DISPOSITION: -Restart Hydrea - Repeat labs and RTC in 2 months  All questions were answered. The patient knows to call the clinic with any problems, questions or concerns.  Medical decision making: Moderate  Time spent on visit: I spent 20 minutes counseling the patient face to face. The total time spent in the appointment was 30 minutes and more than 50% was on counseling.   Harriett Rush, PA-C  10/10/2019 to  12:15 PM

## 2021-10-09 ENCOUNTER — Inpatient Hospital Stay (HOSPITAL_COMMUNITY): Payer: Medicare Other | Attending: Physician Assistant | Admitting: Physician Assistant

## 2021-10-09 ENCOUNTER — Other Ambulatory Visit: Payer: Self-pay

## 2021-10-09 ENCOUNTER — Inpatient Hospital Stay (HOSPITAL_COMMUNITY): Payer: Medicare Other

## 2021-10-09 VITALS — BP 153/61 | HR 64 | Temp 97.0°F | Resp 18 | Wt 145.3 lb

## 2021-10-09 DIAGNOSIS — Z1589 Genetic susceptibility to other disease: Secondary | ICD-10-CM

## 2021-10-09 DIAGNOSIS — D75839 Thrombocytosis, unspecified: Secondary | ICD-10-CM | POA: Diagnosis not present

## 2021-10-09 DIAGNOSIS — D473 Essential (hemorrhagic) thrombocythemia: Secondary | ICD-10-CM | POA: Diagnosis not present

## 2021-10-09 DIAGNOSIS — R0609 Other forms of dyspnea: Secondary | ICD-10-CM | POA: Insufficient documentation

## 2021-10-09 LAB — CBC WITH DIFFERENTIAL/PLATELET
Abs Immature Granulocytes: 0.02 10*3/uL (ref 0.00–0.07)
Basophils Absolute: 0.1 10*3/uL (ref 0.0–0.1)
Basophils Relative: 1 %
Eosinophils Absolute: 0.1 10*3/uL (ref 0.0–0.5)
Eosinophils Relative: 1 %
HCT: 43 % (ref 36.0–46.0)
Hemoglobin: 13.9 g/dL (ref 12.0–15.0)
Immature Granulocytes: 0 %
Lymphocytes Relative: 16 %
Lymphs Abs: 1.2 10*3/uL (ref 0.7–4.0)
MCH: 28.5 pg (ref 26.0–34.0)
MCHC: 32.3 g/dL (ref 30.0–36.0)
MCV: 88.3 fL (ref 80.0–100.0)
Monocytes Absolute: 0.6 10*3/uL (ref 0.1–1.0)
Monocytes Relative: 8 %
Neutro Abs: 5.3 10*3/uL (ref 1.7–7.7)
Neutrophils Relative %: 74 %
Platelets: 530 10*3/uL — ABNORMAL HIGH (ref 150–400)
RBC: 4.87 MIL/uL (ref 3.87–5.11)
RDW: 14.4 % (ref 11.5–15.5)
WBC: 7.3 10*3/uL (ref 4.0–10.5)
nRBC: 0 % (ref 0.0–0.2)

## 2021-10-09 MED ORDER — HYDROXYUREA 500 MG PO CAPS: ORAL_CAPSULE | ORAL | 6 refills | Status: DC

## 2021-10-09 NOTE — Patient Instructions (Signed)
[place] [facility] Discharge Instructions  You were seen today by Tarri Abernethy PA-C for your essential thrombocytosis (elevated platelets).   Your platelets are continuing to go up after stopping your Hydrea and this may be the cause of some of your dizziness.  I recommend restarting your Hydrea (500 mg on Monday and Thursday).  We will recheck your labs in 2 months and see you for follow-up visit at that time.  Please pay close attention to how you are feeling between now and then, and if you experience any concerning side effects, please let us know.  LABS: Return in 2 months for repeat labs  OTHER TESTS: No other tests at this time  MEDICATIONS: Restart your Hydrea 500 mg twice a week (Monday and Thursday)  FOLLOW-UP APPOINTMENT: Office visit in 2 months, same day as labs   Thank you for choosing [place] [facility] to provide your oncology and hematology care.  To afford each patient quality time with our provider, please arrive at least 15 minutes before your scheduled appointment time.   If you have a lab appointment with the [facility] please come in thru the Main Entrance and check in at the main information desk.  You need to re-schedule your appointment should you arrive 10 or more minutes late.  We strive to give you quality time with our providers, and arriving late affects you and other patients whose appointments are after yours.  Also, if you no show three or more times for appointments you may be dismissed from the clinic at the providers discretion.     Again, thank you for choosing [facility].  Our hope is that these requests will decrease the amount of time that you wait before being seen by our physicians.       _____________________________________________________________  Should you have questions after your visit to [facility], please contact our office at [phone] and  follow the prompts.  Our office hours are 8:00 a.m. and 4:30 p.m. Monday - Friday.  Please note that voicemails left after 4:00 p.m. may not be returned until the following business day.  We are closed weekends and major holidays.  You do have access to a nurse 24-7, just call the main number to the clinic [phone] and do not press any options, hold on the line and a nurse will answer the phone.    For prescription refill requests, have your pharmacy contact our office and allow 72 hours.    Due to Covid, you will need to wear a mask upon entering the hospital. If you do not have a mask, a mask will be given to you at the Main Entrance upon arrival. For doctor visits, patients may have 1 support person age 11 or older with them. For treatment visits, patients can not have anyone with them due to social distancing guidelines and our immunocompromised population.

## 2021-10-13 ENCOUNTER — Telehealth: Payer: Self-pay | Admitting: *Deleted

## 2021-10-13 NOTE — Telephone Encounter (Signed)
   Telephone encounter was:  Unsuccessful.  10/13/2021 Name: Kristy [name] MRN: [id number] DOB: 1928-10-04  Unsuccessful outbound call made today to assist with:   in home caare  Outreach Attempt:  1st Attempt  A HIPAA compliant voice message was left requesting a return call.  Instructed patient to call back at   Instructed patient to call back at [phone]  at their earliest convenience.   [facility], Care Management  [phone] [address] , [place] [address] Email : [email or web address]

## 2021-10-20 ENCOUNTER — Telehealth: Payer: Self-pay | Admitting: *Deleted

## 2021-10-20 NOTE — Telephone Encounter (Signed)
   Telephone encounter was:  Successful.  10/20/2021 Name: Kristy [name] MRN: [id number] DOB: 08-Mar-1928  [name] is a 87 y.o. year old female who is a primary care patient of [name], DO . The community resource team was consulted for assistance with Long term care insurance , and is unsure about getting things done .Provided contact information and also senior center info and inhoh=me care   Care guide performed the following interventions: Patient provided with information about care guide support team and interviewed to confirm resource needs Follow up call placed to community resources to determine status of patients referral.  Follow Up Plan:  No further follow up planned at this time. The patient has been provided with needed resources.

## 2021-11-08 DIAGNOSIS — R5381 Other malaise: Secondary | ICD-10-CM | POA: Diagnosis not present

## 2021-11-08 DIAGNOSIS — W19XXXA Unspecified fall, initial encounter: Secondary | ICD-10-CM | POA: Diagnosis not present

## 2021-11-08 DIAGNOSIS — R69 Illness, unspecified: Secondary | ICD-10-CM | POA: Diagnosis not present

## 2021-11-09 ENCOUNTER — Emergency Department (HOSPITAL_COMMUNITY): Payer: Medicare Other

## 2021-11-09 ENCOUNTER — Inpatient Hospital Stay (HOSPITAL_COMMUNITY)
Admission: EM | Admit: 2021-11-09 | Discharge: 2021-11-15 | DRG: 064 | Disposition: A | Discharge status: Skilled Nursing Facility | Payer: Medicare Other | Attending: Internal Medicine | Admitting: Internal Medicine

## 2021-11-09 ENCOUNTER — Inpatient Hospital Stay (HOSPITAL_COMMUNITY): Payer: Medicare Other

## 2021-11-09 ENCOUNTER — Encounter (HOSPITAL_COMMUNITY): Payer: Self-pay | Admitting: Emergency Medicine

## 2021-11-09 ENCOUNTER — Other Ambulatory Visit: Payer: Self-pay

## 2021-11-09 DIAGNOSIS — K760 Fatty (change of) liver, not elsewhere classified: Secondary | ICD-10-CM | POA: Diagnosis not present

## 2021-11-09 DIAGNOSIS — M7989 Other specified soft tissue disorders: Secondary | ICD-10-CM | POA: Diagnosis not present

## 2021-11-09 DIAGNOSIS — Z9049 Acquired absence of other specified parts of digestive tract: Secondary | ICD-10-CM

## 2021-11-09 DIAGNOSIS — S199XXA Unspecified injury of neck, initial encounter: Secondary | ICD-10-CM | POA: Diagnosis not present

## 2021-11-09 DIAGNOSIS — Y92017 Garden or yard in single-family (private) house as the place of occurrence of the external cause: Secondary | ICD-10-CM | POA: Diagnosis not present

## 2021-11-09 DIAGNOSIS — D75838 Other thrombocytosis: Secondary | ICD-10-CM | POA: Diagnosis present

## 2021-11-09 DIAGNOSIS — H04129 Dry eye syndrome of unspecified lacrimal gland: Secondary | ICD-10-CM | POA: Diagnosis not present

## 2021-11-09 DIAGNOSIS — R278 Other lack of coordination: Secondary | ICD-10-CM | POA: Diagnosis not present

## 2021-11-09 DIAGNOSIS — I10 Essential (primary) hypertension: Secondary | ICD-10-CM | POA: Diagnosis present

## 2021-11-09 DIAGNOSIS — K5900 Constipation, unspecified: Secondary | ICD-10-CM | POA: Diagnosis not present

## 2021-11-09 DIAGNOSIS — Z87891 Personal history of nicotine dependence: Secondary | ICD-10-CM

## 2021-11-09 DIAGNOSIS — E785 Hyperlipidemia, unspecified: Secondary | ICD-10-CM | POA: Diagnosis not present

## 2021-11-09 DIAGNOSIS — K219 Gastro-esophageal reflux disease without esophagitis: Secondary | ICD-10-CM | POA: Diagnosis present

## 2021-11-09 DIAGNOSIS — E871 Hypo-osmolality and hyponatremia: Secondary | ICD-10-CM | POA: Diagnosis present

## 2021-11-09 DIAGNOSIS — I69321 Dysphasia following cerebral infarction: Secondary | ICD-10-CM | POA: Diagnosis not present

## 2021-11-09 DIAGNOSIS — I639 Cerebral infarction, unspecified: Secondary | ICD-10-CM | POA: Diagnosis not present

## 2021-11-09 DIAGNOSIS — E876 Hypokalemia: Secondary | ICD-10-CM | POA: Diagnosis not present

## 2021-11-09 DIAGNOSIS — Z9181 History of falling: Secondary | ICD-10-CM | POA: Diagnosis not present

## 2021-11-09 DIAGNOSIS — Z823 Family history of stroke: Secondary | ICD-10-CM

## 2021-11-09 DIAGNOSIS — Z20822 Contact with and (suspected) exposure to covid-19: Secondary | ICD-10-CM | POA: Diagnosis present

## 2021-11-09 DIAGNOSIS — I619 Nontraumatic intracerebral hemorrhage, unspecified: Secondary | ICD-10-CM | POA: Diagnosis not present

## 2021-11-09 DIAGNOSIS — K5730 Diverticulosis of large intestine without perforation or abscess without bleeding: Secondary | ICD-10-CM | POA: Diagnosis not present

## 2021-11-09 DIAGNOSIS — S52514A Nondisplaced fracture of right radial styloid process, initial encounter for closed fracture: Secondary | ICD-10-CM | POA: Diagnosis present

## 2021-11-09 DIAGNOSIS — G4089 Other seizures: Secondary | ICD-10-CM | POA: Diagnosis present

## 2021-11-09 DIAGNOSIS — W19XXXA Unspecified fall, initial encounter: Secondary | ICD-10-CM

## 2021-11-09 DIAGNOSIS — Z7901 Long term (current) use of anticoagulants: Secondary | ICD-10-CM

## 2021-11-09 DIAGNOSIS — S06349A Traumatic hemorrhage of right cerebrum with loss of consciousness of unspecified duration, initial encounter: Secondary | ICD-10-CM | POA: Diagnosis present

## 2021-11-09 DIAGNOSIS — R6889 Other general symptoms and signs: Secondary | ICD-10-CM | POA: Diagnosis not present

## 2021-11-09 DIAGNOSIS — Z8249 Family history of ischemic heart disease and other diseases of the circulatory system: Secondary | ICD-10-CM

## 2021-11-09 DIAGNOSIS — K449 Diaphragmatic hernia without obstruction or gangrene: Secondary | ICD-10-CM | POA: Diagnosis not present

## 2021-11-09 DIAGNOSIS — G936 Cerebral edema: Secondary | ICD-10-CM | POA: Diagnosis not present

## 2021-11-09 DIAGNOSIS — S06340A Traumatic hemorrhage of right cerebrum without loss of consciousness, initial encounter: Secondary | ICD-10-CM | POA: Diagnosis not present

## 2021-11-09 DIAGNOSIS — Z79899 Other long term (current) drug therapy: Secondary | ICD-10-CM | POA: Diagnosis not present

## 2021-11-09 DIAGNOSIS — I480 Paroxysmal atrial fibrillation: Secondary | ICD-10-CM | POA: Diagnosis not present

## 2021-11-09 DIAGNOSIS — W1830XA Fall on same level, unspecified, initial encounter: Secondary | ICD-10-CM | POA: Diagnosis present

## 2021-11-09 DIAGNOSIS — H269 Unspecified cataract: Secondary | ICD-10-CM | POA: Diagnosis not present

## 2021-11-09 DIAGNOSIS — S3991XA Unspecified injury of abdomen, initial encounter: Secondary | ICD-10-CM | POA: Diagnosis not present

## 2021-11-09 DIAGNOSIS — I6523 Occlusion and stenosis of bilateral carotid arteries: Secondary | ICD-10-CM | POA: Diagnosis not present

## 2021-11-09 DIAGNOSIS — S06360A Traumatic hemorrhage of cerebrum, unspecified, without loss of consciousness, initial encounter: Secondary | ICD-10-CM | POA: Diagnosis not present

## 2021-11-09 DIAGNOSIS — I629 Nontraumatic intracranial hemorrhage, unspecified: Secondary | ICD-10-CM | POA: Diagnosis not present

## 2021-11-09 DIAGNOSIS — S0634AA Traumatic hemorrhage of right cerebrum with loss of consciousness status unknown, initial encounter: Secondary | ICD-10-CM

## 2021-11-09 DIAGNOSIS — I2510 Atherosclerotic heart disease of native coronary artery without angina pectoris: Secondary | ICD-10-CM | POA: Diagnosis not present

## 2021-11-09 DIAGNOSIS — R0781 Pleurodynia: Secondary | ICD-10-CM

## 2021-11-09 DIAGNOSIS — Z7401 Bed confinement status: Secondary | ICD-10-CM | POA: Diagnosis not present

## 2021-11-09 DIAGNOSIS — I4891 Unspecified atrial fibrillation: Secondary | ICD-10-CM | POA: Diagnosis not present

## 2021-11-09 DIAGNOSIS — R29709 NIHSS score 9: Secondary | ICD-10-CM | POA: Diagnosis present

## 2021-11-09 DIAGNOSIS — R410 Disorientation, unspecified: Secondary | ICD-10-CM | POA: Diagnosis not present

## 2021-11-09 DIAGNOSIS — I611 Nontraumatic intracerebral hemorrhage in hemisphere, cortical: Secondary | ICD-10-CM | POA: Diagnosis not present

## 2021-11-09 DIAGNOSIS — M6281 Muscle weakness (generalized): Secondary | ICD-10-CM | POA: Diagnosis not present

## 2021-11-09 DIAGNOSIS — I161 Hypertensive emergency: Secondary | ICD-10-CM | POA: Diagnosis present

## 2021-11-09 DIAGNOSIS — S0993XA Unspecified injury of face, initial encounter: Secondary | ICD-10-CM | POA: Diagnosis not present

## 2021-11-09 DIAGNOSIS — Z833 Family history of diabetes mellitus: Secondary | ICD-10-CM | POA: Diagnosis not present

## 2021-11-09 DIAGNOSIS — S0003XA Contusion of scalp, initial encounter: Secondary | ICD-10-CM | POA: Diagnosis not present

## 2021-11-09 DIAGNOSIS — R569 Unspecified convulsions: Secondary | ICD-10-CM | POA: Diagnosis not present

## 2021-11-09 DIAGNOSIS — I482 Chronic atrial fibrillation, unspecified: Secondary | ICD-10-CM | POA: Diagnosis present

## 2021-11-09 DIAGNOSIS — S52511A Displaced fracture of right radial styloid process, initial encounter for closed fracture: Secondary | ICD-10-CM | POA: Diagnosis not present

## 2021-11-09 DIAGNOSIS — T1490XA Injury, unspecified, initial encounter: Secondary | ICD-10-CM | POA: Diagnosis not present

## 2021-11-09 DIAGNOSIS — I69398 Other sequelae of cerebral infarction: Secondary | ICD-10-CM | POA: Diagnosis not present

## 2021-11-09 DIAGNOSIS — Z743 Need for continuous supervision: Secondary | ICD-10-CM | POA: Diagnosis not present

## 2021-11-09 DIAGNOSIS — J9811 Atelectasis: Secondary | ICD-10-CM | POA: Diagnosis not present

## 2021-11-09 DIAGNOSIS — S299XXA Unspecified injury of thorax, initial encounter: Secondary | ICD-10-CM | POA: Diagnosis not present

## 2021-11-09 DIAGNOSIS — S061X9A Traumatic cerebral edema with loss of consciousness of unspecified duration, initial encounter: Secondary | ICD-10-CM | POA: Diagnosis not present

## 2021-11-09 DIAGNOSIS — M25511 Pain in right shoulder: Secondary | ICD-10-CM | POA: Diagnosis not present

## 2021-11-09 DIAGNOSIS — M419 Scoliosis, unspecified: Secondary | ICD-10-CM | POA: Diagnosis not present

## 2021-11-09 DIAGNOSIS — S52514D Nondisplaced fracture of right radial styloid process, subsequent encounter for closed fracture with routine healing: Secondary | ICD-10-CM | POA: Diagnosis not present

## 2021-11-09 DIAGNOSIS — S52501A Unspecified fracture of the lower end of right radius, initial encounter for closed fracture: Secondary | ICD-10-CM | POA: Diagnosis not present

## 2021-11-09 DIAGNOSIS — I69391 Dysphagia following cerebral infarction: Secondary | ICD-10-CM | POA: Diagnosis not present

## 2021-11-09 DIAGNOSIS — R609 Edema, unspecified: Secondary | ICD-10-CM | POA: Diagnosis not present

## 2021-11-09 LAB — BASIC METABOLIC PANEL
Anion gap: 10 (ref 5–15)
BUN: 16 mg/dL (ref 8–23)
CO2: 26 mmol/L (ref 22–32)
Calcium: 10 mg/dL (ref 8.9–10.3)
Chloride: 105 mmol/L (ref 98–111)
Creatinine, Ser: 1.02 mg/dL — ABNORMAL HIGH (ref 0.44–1.00)
GFR, Estimated: 52 mL/min — ABNORMAL LOW (ref >60)
Glucose, Bld: 166 mg/dL — ABNORMAL HIGH (ref 70–99)
Potassium: 4.6 mmol/L (ref 3.5–5.1)
Sodium: 141 mmol/L (ref 135–145)

## 2021-11-09 LAB — CBC WITH DIFFERENTIAL/PLATELET
Abs Immature Granulocytes: 0.05 10*3/uL (ref 0.00–0.07)
Basophils Absolute: 0 10*3/uL (ref 0.0–0.1)
Basophils Relative: 0 %
Eosinophils Absolute: 0 10*3/uL (ref 0.0–0.5)
Eosinophils Relative: 0 %
HCT: 43.4 % (ref 36.0–46.0)
Hemoglobin: 14 g/dL (ref 12.0–15.0)
Immature Granulocytes: 1 %
Lymphocytes Relative: 7 %
Lymphs Abs: 0.7 10*3/uL (ref 0.7–4.0)
MCH: 28.3 pg (ref 26.0–34.0)
MCHC: 32.3 g/dL (ref 30.0–36.0)
MCV: 87.7 fL (ref 80.0–100.0)
Monocytes Absolute: 0.7 10*3/uL (ref 0.1–1.0)
Monocytes Relative: 7 %
Neutro Abs: 8.1 10*3/uL — ABNORMAL HIGH (ref 1.7–7.7)
Neutrophils Relative %: 85 %
Platelets: 466 10*3/uL — ABNORMAL HIGH (ref 150–400)
RBC: 4.95 MIL/uL (ref 3.87–5.11)
RDW: 15.2 % (ref 11.5–15.5)
WBC: 9.6 10*3/uL (ref 4.0–10.5)
nRBC: 0 % (ref 0.0–0.2)

## 2021-11-09 LAB — RESP PANEL BY RT-PCR (FLU A&B, COVID) ARPGX2
Influenza A by PCR: NEGATIVE
Influenza B by PCR: NEGATIVE
SARS Coronavirus 2 by RT PCR: NEGATIVE

## 2021-11-09 LAB — ETHANOL: Alcohol, Ethyl (B): <10 mg/dL (ref <10)

## 2021-11-09 LAB — APTT: aPTT: 31 seconds (ref 24–36)

## 2021-11-09 LAB — PROTIME-INR
INR: 1.6 — ABNORMAL HIGH (ref 0.8–1.2)
Prothrombin Time: 18.8 seconds — ABNORMAL HIGH (ref 11.4–15.2)

## 2021-11-09 MED ORDER — IOHEXOL 300 MG/ML  SOLN
100.0000 mL | Freq: Once | INTRAMUSCULAR | Status: AC | PRN
  Administered 2021-11-09: 17:00:00 100 mL via INTRAVENOUS

## 2021-11-09 MED ORDER — CLEVIDIPINE BUTYRATE 0.5 MG/ML IV EMUL
0.0000 mg/h | INTRAVENOUS | Status: DC
  Administered 2021-11-09: 15:00:00 2 mg/h via INTRAVENOUS
  Filled 2021-11-09: qty 50

## 2021-11-09 MED ORDER — POLYETHYLENE GLYCOL 3350 17 G PO PACK: 17.0000 g | PACK | Freq: Every day | ORAL | Status: DC | PRN

## 2021-11-09 MED ORDER — IOHEXOL 350 MG/ML SOLN
80.0000 mL | Freq: Once | INTRAVENOUS | Status: AC | PRN
  Administered 2021-11-09: 22:00:00 80 mL via INTRAVENOUS

## 2021-11-09 MED ORDER — DOCUSATE SODIUM 100 MG PO CAPS
100.0000 mg | ORAL_CAPSULE | Freq: Two times a day (BID) | ORAL | Status: DC | PRN
  Administered 2021-11-15: 100 mg via ORAL
  Filled 2021-11-09: qty 1

## 2021-11-09 MED ORDER — PROTHROMBIN COMPLEX CONC HUMAN 500 UNITS IV KIT
3747.0000 [IU] | PACK | Status: AC
  Administered 2021-11-09: 3747 [IU] via INTRAVENOUS
  Filled 2021-11-09: qty 3186

## 2021-11-09 MED ORDER — CLEVIDIPINE BUTYRATE 0.5 MG/ML IV EMUL
0.0000 mg/h | INTRAVENOUS | Status: DC
  Filled 2021-11-09: qty 50

## 2021-11-09 MED ORDER — EMPTY CONTAINERS FLEXIBLE MISC: 1800.0000 mg | Freq: Once | Status: DC

## 2021-11-09 MED ORDER — ONDANSETRON HCL 4 MG/2ML IJ SOLN: 4.0000 mg | Freq: Four times a day (QID) | INTRAMUSCULAR | Status: DC | PRN

## 2021-11-09 NOTE — Progress Notes (Signed)
Spoke with Dr. Doree Fudge re nondisplaced radial styloid fracture.  Plan for splint.  Full consult to follow.   Johnny Bridge, MD

## 2021-11-09 NOTE — ED Provider Notes (Signed)
[facility] Provider Note   CSN: [id number] Arrival date & time: 11/09/21  1339     History Chief Complaint  Patient presents with   Kristy [name] is a 87 y.o. female. Level 5 caveat  HPI Level 5 caveat  87 yo female with report of fall yesterday.  Patient lives alone.  She was reportedly in the yard yesterday when somebody saw her fall or found her on the ground.  Does not entirely clear.  Reportedly, [facility] came and helped her back And into the house.  Family members tried to contact her today.  Her daughter eventually went and found her laying in her bed.  Has contusions to her face and her right hand.  She was transported by private vehicle to the car and the daughter states that she walked in here.  She was taken to CT and noted to have a large intracerebral hemorrhage.  She is on paroxetine began for A. fib.    Past Medical History:  Diagnosis Date   A-fib [facility])    Cataract    Closed left arm fracture 2016   Colon polyps    Diverticulosis    Endometrial hyperplasia without atypia, simple    Endometrial polyp    Fatty liver    GERD (gastroesophageal reflux disease)    Hiatal hernia    Hyperplastic colon polyp    Hypertension    Lipoma    Obesity    Optic neuropathy of both eyes 1989, 1997   SOB (shortness of breath)    Thrombocytosis     Patient Active Problem List   Diagnosis Date Noted   Endometrial hyperplasia without atypia, simple 10/03/2021   Feared complaint without diagnosis 08/22/2020   Atrial flutter ([place]) 01/29/2020   Educated about COVID-19 virus infection 01/29/2020   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   Drug rash 09/25/2019   Panic disorder 08/23/2019   Chemotherapy follow-up examination 06/30/2019   Hypnopompic hallucination 06/30/2019   Colon polyps 06/29/2019   Diverticulosis 06/29/2019   Fatty liver 06/29/2019   Optic neuritis 06/29/2019   Non-rheumatic tricuspid valve  insufficiency 07/15/2018   Tick bite 05/10/2017   Thoracic aortic atherosclerosis ([place]) 75/64/3329   Metabolic syndrome 51/88/4166   JAK2 V617F mutation 01/24/2016   Essential thrombocytosis ([place]) 01/24/2016   Closed Colles' fracture of left radius with routine healing 11/21/2015   PAF (paroxysmal atrial fibrillation) ([place]) 12/18/2013   Cervical spondylosis without myelopathy 04/27/2013   ABDOMINAL PAIN, UNSPECIFIED SITE 02/02/2011   Palpitations 02/26/2010   COLONIC POLYPS, HYPERPLASTIC 12/07/2007   LIPOMA 12/07/2007   OBESITY 12/07/2007   Essential hypertension 12/07/2007   GERD 12/07/2007   HIATAL HERNIA 12/07/2007   DIVERTICULOSIS, COLON 12/07/2007   FATTY LIVER DISEASE 12/07/2007   ENDOMETRIAL POLYP 12/07/2007   COMPLEX ENDOMETRIAL HYPERPLASIA WITHOUT ATYPIA 12/07/2007   SOB 12/07/2007   Benign endometrial hyperplasia 12/07/2007   Hiatal hernia 12/07/2007    Past Surgical History:  Procedure Laterality Date   breast biopsy     CATARACT EXTRACTION     CHOLECYSTECTOMY  03/2002   DILATION AND CURETTAGE OF UTERUS     x 2   EYE SURGERY     HERNIA REPAIR     KNEE SURGERY     optic neuritis     umbilical hernia repair  09/2002     OB History   No obstetric history on file.     Family History  Problem Relation  Age of Onset   Diabetes Mother    Diabetes Sister    Stroke Father    Diabetes Brother    Heart disease Brother    Cancer Sister        lung   Cancer Sister        vulva   Cancer Sister    Dementia Sister    Osteoporosis Sister    Diabetes Sister    Osteoporosis Sister    Diabetes Sister    Cancer Brother        bladder   Colon cancer Neg Hx     Social History   Tobacco Use   Smoking status: Former    Types: Cigarettes    Quit date: 12/07/1982    Years since quitting: 38.9   Smokeless tobacco: Never  Vaping Use   Vaping Use: Never used  Substance Use Topics   Alcohol use: No   Drug use: No    Home Medications Prior to Admission  medications   Medication Sig Start Date End Date Taking? Authorizing Provider  ALPRAZolam Duanne Moron) 0.25 MG tablet Take 0.5-1 tablets (0.125-0.25 mg total) by mouth daily as needed for anxiety. 04/01/21   [name], DO  Cholecalciferol (VITAMIN D3) 2000 UNITS TABS Take 4,000 Units by mouth daily.    [provider]  dorzolamide-timolol (COSOPT) 22.3-6.8 MG/ML ophthalmic solution  09/09/20   [provider]  hydrochlorothiazide (HYDRODIURIL) 12.5 MG tablet Take 1 tablet (12.5 mg total) by mouth daily. 05/19/21   [name], DO  hydroxyurea (HYDREA) 500 MG capsule Take one tablet by mouth on Monday and Thursday 10/09/21   Tarri Abernethy M, PA-C  meclizine (ANTIVERT) 12.5 MG tablet Take 1 tablet (12.5 mg total) by mouth as needed. Take as needed for  vertigo/ dizziness Patient taking differently: Take 12.5 mg by mouth as needed. Take as needed for  vertigo/ dizziness 02/06/21   Derek Jack, MD  metoprolol succinate (TOPROL-XL) 25 MG 24 hr tablet Take 1 tablet (25 mg total) by mouth daily. 02/26/21   Minus Breeding, MD  Multiple Vitamins-Minerals (EYE VITAMINS PO) Take 1 tablet by mouth daily.    [provider]  omeprazole (PRILOSEC) 20 MG capsule TAKE 1 CAPSULE BY MOUTH  DAILY 02/07/21   Ronnie Doss M, DO  Polyethyl Glycol-Propyl Glycol (SYSTANE OP) Apply 1-2 drops to eye 4 (four) times daily as needed (dry eyes).    [provider]  Rivaroxaban (XARELTO) 15 MG TABS tablet Take 1 tablet (15 mg total) by mouth daily with supper. 04/08/21   [name], DO  sertraline (ZOLOFT) 50 MG tablet Take 1 tablet (50 mg total) by mouth daily. 04/01/21   [name], DO    Allergies    Codeine, Latanoprost, and Tramadol  Review of Systems   Review of Systems  Physical Exam Updated Vital Signs BP 138/73   Pulse 64   Temp 98.3 F (36.8 C) (Oral)   Resp 16   SpO2 100%   Physical Exam Vitals and nursing note reviewed.   Constitutional:      General: She is not in acute distress.    Appearance: She is ill-appearing.  HENT:     Head: Normocephalic.     Comments: Contusion to lip     Right Ear: External ear normal.     Left Ear: External ear normal.     Nose: Nose normal.     Mouth/Throat:     Pharynx: Oropharynx is clear.  Eyes:     Comments: perrl  Cardiovascular:     Rate and Rhythm: Normal rate. Rhythm irregular.  Pulmonary:     Effort: Pulmonary effort is normal.  Abdominal:     Palpations: Abdomen is soft.  Musculoskeletal:     Cervical back: Rigidity present.     Comments: Contusion dorsal aspect right hand proximally to wrist no obvious deformity noted  Skin:    General: Skin is warm.     Capillary Refill: Capillary refill takes less than 2 seconds.  Neurological:     General: No focal deficit present.     Mental Status: She is lethargic.     GCS: GCS eye subscore is 1. GCS verbal subscore is 2. GCS motor subscore is 1.     Motor: Weakness present.     Comments: Patient with some decreased movement on right She is becoming less responsive and has had verbal response to stimuli only  Psychiatric:        Mood and Affect: Mood normal.    ED Results / Procedures / Treatments   Labs (all labs ordered are listed, but only abnormal results are displayed) Labs Reviewed  BASIC METABOLIC PANEL - Abnormal; Notable for the following components:      Result Value   Glucose, Bld 166 (*)    Creatinine, Ser 1.02 (*)    GFR, Estimated 52 (*)    All other components within normal limits  CBC WITH DIFFERENTIAL/PLATELET - Abnormal; Notable for the following components:   Platelets 466 (*)    Neutro Abs 8.1 (*)    All other components within normal limits  PROTIME-INR - Abnormal; Notable for the following components:   Prothrombin Time 18.8 (*)    INR 1.6 (*)    All other components within normal limits  RESP PANEL BY RT-PCR (FLU A&B, COVID) ARPGX2  APTT  ETHANOL  RAPID URINE DRUG SCREEN,  HOSP PERFORMED  URINALYSIS, ROUTINE W REFLEX MICROSCOPIC    EKG EKG Interpretation  Date/Time:  Sunday November 09 2021 14:38:46 EST Ventricular Rate:  76 PR Interval:    QRS Duration: 116 QT Interval:  431 QTC Calculation: 485 R Axis:   -72 Text Interpretation: Atrial fibrillation Incomplete RBBB and LAFB Consider anterior infarct Confirmed by Pattricia Boss [phone]) on 11/09/2021 3:47:08 PM  Radiology CT Head Wo Contrast  Addendum Date: 11/09/2021   ADDENDUM REPORT: 11/09/2021 14:49 ADDENDUM: Not included in the initial report, there is small volume sulcal subarachnoid hemorrhage adjacent to the parenchymal hemorrhage. Additionally, there is a 1.3 cm partially calcified meningioma of the anterior cranial fossa along the cribriform plate. Electronically Signed   By: Macy Mis M.D.   On: 11/09/2021 14:49   Result Date: 11/09/2021 CLINICAL DATA:  Facial trauma EXAM: CT HEAD WITHOUT CONTRAST TECHNIQUE: Contiguous axial images were obtained from the base of the skull through the vertex without intravenous contrast. COMPARISON:  None. FINDINGS: Brain: Large area of parenchymal hemorrhage is present in the right frontal lobe including the opercular region as well as the insula and subinsular white matter. This measures approximately 5.5 x 3.8 x 4.6 cm. Surrounding edema is present. There is mass effect including effacement of the right lateral ventricle and mild 4 mm leftward midline shift. No definite hydrocephalus. Prominence of ventricles and sulci reflects parenchymal volume loss. Gray-white differentiation is preserved. Patchy hypoattenuation in the supratentorial white matter is nonspecific but probably reflects chronic microvascular ischemic changes. Vascular: There is atherosclerotic calcification at the skull base. Skull: Calvarium  is unremarkable. Sinuses/Orbits: No acute finding. Other: None. IMPRESSION: Right frontoinsular parenchymal hemorrhage with surrounding edema and regional mass  effect including mild leftward midline shift. No definite hydrocephalus. These results were called by telephone at the time of interpretation on 11/09/2021 at 2:39 pm to provider [facility] , who verbally acknowledged these results. Electronically Signed: By: Macy Mis M.D. On: 11/09/2021 14:41   CT Cervical Spine Wo Contrast  Result Date: 11/09/2021 CLINICAL DATA:  Neck trauma, intoxicated or obtunded (Age >= 16y) EXAM: CT CERVICAL SPINE WITHOUT CONTRAST TECHNIQUE: Multidetector CT imaging of the cervical spine was performed without intravenous contrast. Multiplanar CT image reconstructions were also generated. COMPARISON:  None. FINDINGS: Alignment: Mild anterolisthesis at C7-T1. Skull base and vertebrae: Vertebral body heights are maintained. No acute fracture. Soft tissues and spinal canal: No prevertebral fluid or swelling. No visible canal hematoma. Disc levels: Multilevel degenerative changes are present including disc space narrowing, endplate osteophytes, and facet and uncovertebral hypertrophy. No high-grade osseous encroachment on the spinal canal. Multilevel foraminal stenosis. Upper chest: Minimally included lung apices are clear. Other: Mild calcified plaque along the proximal internal carotids. IMPRESSION: No acute cervical spine fracture. Electronically Signed   By: Macy Mis M.D.   On: 11/09/2021 14:46   CT Maxillofacial Wo Contrast  Result Date: 11/09/2021 CLINICAL DATA:  Facial trauma EXAM: CT MAXILLOFACIAL WITHOUT CONTRAST TECHNIQUE: Multidetector CT imaging of the maxillofacial structures was performed. Multiplanar CT image reconstructions were also generated. COMPARISON:  None. FINDINGS: Osseous: No acute facial fracture. Orbits: No intraorbital hematoma.  Bilateral lens replacements. Sinuses: Patchy mucosal thickening. Soft tissues: Right frontal scalp soft tissue swelling/hematoma. Limited intracranial: Dictated separately. IMPRESSION: No acute facial fracture.  Electronically Signed   By: Macy Mis M.D.   On: 11/09/2021 14:51    Procedures .Critical Care Performed by: Pattricia Boss, MD Authorized by: Pattricia Boss, MD   Critical care provider statement:    Critical care time (minutes):  60   Critical care was necessary to treat or prevent imminent or life-threatening deterioration of the following conditions:  CNS failure or compromise   Critical care was time spent personally by me on the following activities:  Development of treatment plan with patient or surrogate, discussions with consultants, evaluation of patient's response to treatment, examination of patient, ordering and review of laboratory studies, ordering and review of radiographic studies, ordering and performing treatments and interventions, pulse oximetry, re-evaluation of patient's condition and review of old charts   Medications Ordered in ED Medications  prothrombin complex conc human (KCENTRA) IVPB 3,747 Units (3,747 Units Intravenous New Bag/Given 11/09/21 1529)  clevidipine (CLEVIPREX) infusion 0.5 mg/mL (2 mg/hr Intravenous New Bag/Given 11/09/21 1512)    ED Course  I have reviewed the triage vital signs and the nursing notes.  Pertinent labs & imaging results that were available during my care of the patient were reviewed by me and considered in my medical decision making (see chart for details).    87 year old female who has been living independently on rivaroxaban for A. fib presents today with altered mental status and large intracerebral hemorrhage Discussed care with daughter at bedside Discussed care with son, Kristy [name], who is the power of attorney.  He is coming from [facility].  His contact number is [phone] He wishes patient to have standard of care.  I discussed that this is unlikely to be survivable Awaiting neurosurgery valuation-care discussed with Dr. Kathyrn Sheriff Neurology consulted and care discussed with Dr. Cheral Marker Trauma consulted discussed  with Dr.  Zenia Resides Discussed care with Dr. Darl Householder who will assume care  MDM Rules/Calculators/A&P                         Final Clinical Impression(s) / ED Diagnoses Final diagnoses:  Fall, initial encounter  Traumatic hemorrhage of right cerebrum with unknown loss of consciousness status, initial encounter  Chronic anticoagulation  Chronic atrial fibrillation [facility])    Rx / DC Orders ED Discharge Orders     None        Pattricia Boss, MD 11/09/21 1551

## 2021-11-09 NOTE — ED Notes (Signed)
Trauma paged to Dr. Jeanell Sparrow per RN Ryans request

## 2021-11-09 NOTE — H&P (Signed)
NAME:  Kristy [name], MRN:  [id number], DOB:  May 20, 1928, LOS: 0 ADMISSION DATE:  11/09/2021, CONSULTATION DATE:  11/09/21 REFERRING MD:  ED , CHIEF COMPLAINT:  Weakness, AMS, hemorrhagic stroke   History of Present Illness:  87 yo woman with hx of afib on xarelto, GERD, hiatal hernia and HTN, here with hemorrhagic stroke, fall from standing on 12/3.  History is not entirely clear, but she had a unwittnessed vs witnessed by neighbors fall from standing (?) yesterday.   Possible loss of consciousness.  Found by family today in her bed with bruises and weakness. Brought by daughter to ED.    On Xarelto for afib  Not able to give much history although she is awake and alert.   Pertinent  Medical History  Afib Cataract Gerd  Hiatal hernia Htn   Significant Hospital Events: Including procedures, antibiotic start and stop dates in addition to other pertinent events     Interim History / Subjective:    Objective   Blood pressure (!) 129/50, pulse 87, temperature 98.3 F (36.8 C), temperature source Oral, resp. rate (!) 23, SpO2 95 %.        Intake/Output Summary (Last 24 hours) at 11/09/2021 1928 Last data filed at 11/09/2021 1838 Gross per 24 hour  Intake 17.22 ml  Output --  Net 17.22 ml   There were no vitals filed for this visit.  Examination: General: eccymosis on Upper lip >lower r lip, Awake and alert, follows simple commands  HENT: No apparent trauma to teeth, pupils 67mm and reactive LungsCTAB Cardiovascular: RRR no mgr  Abdomen: nt, nd, nbs  Extremities: non pitting edema in BLE Neuro: favoring the R side on my exam, intermittently following instructions but no evidence of movement of the LUE for me  GU:   Resolved Hospital Problem list     Assessment & Plan:  Hemorrhagic stroke, R frontal intraparenchymal hemorrhage.:  Neurosurgery consulted.  Non-surgical per their note.   Cont blood pressure control, goal SBP <140.  Already given [place].   Appreciate  neurology consultation.   CTA head and neck  MRI when able  Wrist fracture: Ortho consulting, to place splint in AM.    HTN: on cleveprex  Afib: held [facility] due to bleed.  Kcentra.  Currently rate controlled.     Best Practice (right click and "Reselect all SmartList Selections" daily)   Diet/type: NPO DVT prophylaxis: other GI prophylaxis: PPI Lines: N/A Foley:  N/A Code Status:  full code Last date of multidisciplinary goals of care discussion []   Labs   CBC: Recent Labs  Lab 11/09/21 1410  WBC 9.6  NEUTROABS 8.1*  HGB 14.0  HCT 43.4  MCV 87.7  PLT 466*    Basic Metabolic Panel: Recent Labs  Lab 11/09/21 1410  NA 141  K 4.6  CL 105  CO2 26  GLUCOSE 166*  BUN 16  CREATININE 1.02*  CALCIUM 10.0   GFR: CrCl cannot be calculated (Unknown ideal weight.). Recent Labs  Lab 11/09/21 1410  WBC 9.6    Liver Function Tests: No results for input(s): AST, ALT, ALKPHOS, BILITOT, PROT, ALBUMIN in the last 168 hours. No results for input(s): LIPASE, AMYLASE in the last 168 hours. No results for input(s): AMMONIA in the last 168 hours.  ABG No results found for: PHART, PCO2ART, PO2ART, HCO3, TCO2, ACIDBASEDEF, O2SAT   Coagulation Profile: Recent Labs  Lab 11/09/21 1410  INR 1.6*    Cardiac Enzymes: No results for input(s): CKTOTAL, CKMB, CKMBINDEX,  TROPONINI in the last 168 hours.  HbA1C: HB A1C (BAYER DCA - WAIVED)  Date/Time Value Ref Range Status  03/14/2018 11:59 AM 5.5 <7.0 % Final    Comment:                                          Diabetic Adult            <7.0                                       Healthy Adult        4.3 - 5.7                                                           (DCCT/NGSP) American Diabetes Association's Summary of Glycemic Recommendations for Adults with Diabetes: Hemoglobin A1c <7.0%. More stringent glycemic goals (A1c <6.0%) may further reduce complications at the cost of increased risk of hypoglycemia.    Hgb A1c  MFr Bld  Date/Time Value Ref Range Status  09/25/2016 03:20 PM 5.7 (H) 4.8 - 5.6 % Final    Comment:             Pre-diabetes: 5.7 - 6.4          Diabetes: >6.4          Glycemic control for adults with diabetes: <7.0   07/30/2015 11:39 AM 5.9 (H) 4.8 - 5.6 % Final    Comment:             Pre-diabetes: 5.7 - 6.4          Diabetes: >6.4          Glycemic control for adults with diabetes: <7.0     CBG: No results for input(s): GLUCAP in the last 168 hours.  Review of Systems:   Unabe to assess  Past Medical History:  She,  has a past medical history of A-fib [facility]), Cataract, Closed left arm fracture (2016), Colon polyps, Diverticulosis, Endometrial hyperplasia without atypia, simple, Endometrial polyp, Fatty liver, GERD (gastroesophageal reflux disease), Hiatal hernia, Hyperplastic colon polyp, Hypertension, Lipoma, Obesity, Optic neuropathy of both eyes (1989, 1997), SOB (shortness of breath), and Thrombocytosis.   Surgical History:   Past Surgical History:  Procedure Laterality Date   breast biopsy     CATARACT EXTRACTION     CHOLECYSTECTOMY  03/2002   DILATION AND CURETTAGE OF UTERUS     x 2   EYE SURGERY     HERNIA REPAIR     KNEE SURGERY     optic neuritis     umbilical hernia repair  09/2002     Social History:   reports that she quit smoking about 38 years ago. Her smoking use included cigarettes. She has never used smokeless tobacco. She reports that she does not drink alcohol and does not use drugs.   Family History:  Her family history includes Cancer in her brother, sister, sister, and sister; Dementia in her sister; Diabetes in her brother, mother, sister, sister, and sister; Heart disease in her brother; Osteoporosis in her sister and sister; Stroke in her  father. There is no history of Colon cancer.   Allergies Allergies  Allergen Reactions   Codeine Nausea Only   Latanoprost Other (See Comments)    Red and burning   Tramadol Nausea Only     Home  Medications  Prior to Admission medications   Medication Sig Start Date End Date Taking? Authorizing Provider  ALPRAZolam Duanne Moron) 0.25 MG tablet Take 0.5-1 tablets (0.125-0.25 mg total) by mouth daily as needed for anxiety. 04/01/21   [name], DO  Cholecalciferol (VITAMIN D3) 2000 UNITS TABS Take 4,000 Units by mouth daily.    [provider]  dorzolamide-timolol (COSOPT) 22.3-6.8 MG/ML ophthalmic solution  09/09/20   [provider]  hydrochlorothiazide (HYDRODIURIL) 12.5 MG tablet Take 1 tablet (12.5 mg total) by mouth daily. 05/19/21   [name], DO  hydroxyurea (HYDREA) 500 MG capsule Take one tablet by mouth on Monday and Thursday 10/09/21   Tarri Abernethy M, PA-C  meclizine (ANTIVERT) 12.5 MG tablet Take 1 tablet (12.5 mg total) by mouth as needed. Take as needed for  vertigo/ dizziness Patient taking differently: Take 12.5 mg by mouth as needed. Take as needed for  vertigo/ dizziness 02/06/21   Derek Jack, MD  metoprolol succinate (TOPROL-XL) 25 MG 24 hr tablet Take 1 tablet (25 mg total) by mouth daily. 02/26/21   Minus Breeding, MD  Multiple Vitamins-Minerals (EYE VITAMINS PO) Take 1 tablet by mouth daily.    [provider]  omeprazole (PRILOSEC) 20 MG capsule TAKE 1 CAPSULE BY MOUTH  DAILY 02/07/21   Ronnie Doss M, DO  Polyethyl Glycol-Propyl Glycol (SYSTANE OP) Apply 1-2 drops to eye 4 (four) times daily as needed (dry eyes).    [provider]  Rivaroxaban (XARELTO) 15 MG TABS tablet Take 1 tablet (15 mg total) by mouth daily with supper. 04/08/21   [name], DO  sertraline (ZOLOFT) 50 MG tablet Take 1 tablet (50 mg total) by mouth daily. 04/01/21   [name], DO     Critical care time: 45 min

## 2021-11-09 NOTE — ED Triage Notes (Signed)
Pt fell yesterday outside.  Unknown what caused her to fall- apparently someone driving by stopped to help her -she doesn't remember.  Bruising to R hand and R side of face.  States she doesn't hurt except for soreness to R hand.  Takes Xarelto.

## 2021-11-09 NOTE — Progress Notes (Signed)
Orthopedic Tech Progress Note Patient Details:  Kristy [name] 04-29-1928 [id number]  Ortho Devices Type of Ortho Device: Arm sling, Sugartong splint Ortho Device/Splint Location: rue Ortho Device/Splint Interventions: Adjustment, Application, Ordered   Post Interventions Patient Tolerated: Well Instructions Provided: Care of device, Adjustment of device  [name] 11/09/2021, 10:58 PM

## 2021-11-09 NOTE — ED Provider Notes (Signed)
Emergency Medicine Provider Triage Evaluation Note  Level 5 caveat: Mental status  Kristy [name] , a 87 y.o. female  was evaluated in triage.  Her daughter is at bedside.  Pt complains of fall yesterday.  Patient daughter reports that the patient fell yesterday, it was unwitnessed.  She had an episode of LOC.  Patient is on Xarelto.  Patient reports that someone found the patient on the ground.  Patient denies remembering the fall.  Patient has associated right hand pain and bruising noted to right hand, right cheek.  Patient denies pain elsewhere.  Review of Systems  Positive: Bruising noted to right cheek and lips, and right hand. Negative: Chest pain, shortness of breath  Physical Exam  BP (!) 179/90 (BP Location: Left Arm)   Pulse 88   Temp 98.3 F (36.8 C) (Oral)   Resp 17   SpO2 93%  Gen:   Awake, no distress   Resp:  Normal effort  MSK:   Moves extremities without difficulty  Other:  Moderate bruising noted to right hand with mild tenderness to palpation.  Moderate bruising noted to right cheek and right lip with dried blood noted to the lip.  No maxillary or frontal sinus tenderness.  Medical Decision Making  Medically screening exam initiated at 1:55 PM.  Appropriate orders placed.  [name] was informed that the remainder of the evaluation will be completed by another provider, this initial triage assessment does not replace that evaluation, and the importance of remaining in the ED until their evaluation is complete.    Britian Jentz A, PA-C 11/09/21 1407    Carmin Muskrat, MD 11/09/21 2030

## 2021-11-09 NOTE — ED Notes (Signed)
POA paperwork emailed as PDF, in staff possession.

## 2021-11-09 NOTE — ED Notes (Signed)
Shanon Brow [facility]): [phone] [place]aster Antonieta Iba [phone]

## 2021-11-09 NOTE — Progress Notes (Signed)
eLink Physician-Brief Progress Note Patient Name: Kristy [name] DOB: February 11, 1928 MRN: [id number]   Date of Service  11/09/2021  HPI/Events of Note  10 F fall at home yesterday, today found confused, altered by daughter. on Eliquis for a history of a-fib. A CT scan showed an intraparenchymal hemorrhage on the right with mild midline shift, as well as a small SAH. Neurosurgery was called recommended nonoperative management, with plans to see the patient tomorrow.  Camera: Rt chin, facial bruise. On nasal o2. MAP 85, HR 75, 93% sats. Resting. Rt wrist hand in ace wrappings.  Data: Reviewed. CBC , BMP normal. INR 1.6, covid/flu neg Rt radial styloid minimally displaced #. CxR neg. EKG: a fib 76, qtc 485.  CT angio: Unchanged appearance of large intraparenchymal hemorrhage centered in the right basal ganglia with unchanged mild leftward midline shift. 2. Slightly increased amount of blood layering in the occipital horns. 3. No intracranial arterial occlusion or high-grade stenosis. 4. Mild bilateral carotid bifurcation atherosclerosis without hemodynamically significant stenosis by NASCET criteria.  CT chest/abd: IMPRESSION: 1. No evidence of acute traumatic injury to the chest, abdomen or pelvis. 2. Large hiatal hernia contains the stomach, small and large bowel loops. No evidence of incarceration. 3. Diffuse colonic diverticulosis without evidence of diverticulitis. 4. 8 mm possibly dystrophic calculus within the lower pole of the left kidney, nonobstructive. 5. Enlarged heart. 6. Calcific atherosclerotic disease of the aorta.        eICU Interventions  - continue current Rx plan - asp, sz precautions - hold eliquis - neuro checks - avoid hypotension or hypertension. Maintain CPP. - SCD - CBG goals < 180 - MRI brain.      Intervention Category Major Interventions: Hemorrhage - evaluation and management;Change in mental status - evaluation and  management;Arrhythmia - evaluation and management Evaluation Type: New Patient Evaluation  Elmer Sow 11/09/2021, 11:37 PM

## 2021-11-09 NOTE — Consult Note (Addendum)
Neurology Consultation  Reason for Consult: Spring post fall Referring Physician: Dr. Darl Householder  CC: Fall  History is obtained from: Chart  HPI: Kristy [name] is a 87 y.o. female past medical history of A. fib on Eliquis, hypertension, thrombocytosis, presented to the emergency room after she was found with either loss of her depressed level of consciousness.  She sustained a fall yesterday in the yard and when family tried to contact her today she would not respond, she was found down, brought in by private vehicle.  Stat CT head was done which showed a large ICH along with obvious facial trauma. She is being admitted to the critical care service.  Neurosurgery has been contacted and offered no surgical intervention and recommended neurological evaluation for which neurology was consulted. Eliquis has been reversed in the emergency room-with Andexxa. On Cleviprex for blood pressure management. Trauma scan revealed radial styloid fracture-conservative management for now and will be followed by orthopedics tomorrow.  LKW: Greater than 24 hours ago tpa given?: no, ICH Premorbid modified Rankin scale (mRS): Unclear ICH Score: 2-(26% 30-day mortality).   ROS: Unable to obtain due to altered mental status.   Past Medical History:  Diagnosis Date   A-fib [facility])    Cataract    Closed left arm fracture 2016   Colon polyps    Diverticulosis    Endometrial hyperplasia without atypia, simple    Endometrial polyp    Fatty liver    GERD (gastroesophageal reflux disease)    Hiatal hernia    Hyperplastic colon polyp    Hypertension    Lipoma    Obesity    Optic neuropathy of both eyes 1989, 1997   SOB (shortness of breath)    Thrombocytosis    Family History  Problem Relation Age of Onset   Diabetes Mother    Diabetes Sister    Stroke Father    Diabetes Brother    Heart disease Brother    Cancer Sister        lung   Cancer Sister        vulva   Cancer Sister    Dementia Sister     Osteoporosis Sister    Diabetes Sister    Osteoporosis Sister    Diabetes Sister    Cancer Brother        bladder   Colon cancer Neg Hx    Social History:   reports that she quit smoking about 38 years ago. Her smoking use included cigarettes. She has never used smokeless tobacco. She reports that she does not drink alcohol and does not use drugs.  Medications  Current Facility-Administered Medications:    clevidipine (CLEVIPREX) infusion 0.5 mg/mL, 0-21 mg/hr, Intravenous, Continuous, Ray, Danielle, MD, Last Rate: 4 mL/hr at 11/09/21 1838, 2 mg/hr at 11/09/21 2774  Current Outpatient Medications:    ALPRAZolam (XANAX) 0.25 MG tablet, Take 0.5-1 tablets (0.125-0.25 mg total) by mouth daily as needed for anxiety., Disp: 30 tablet, Rfl: 0   Cholecalciferol (VITAMIN D3) 2000 UNITS TABS, Take 4,000 Units by mouth daily., Disp: , Rfl:    dorzolamide-timolol (COSOPT) 22.3-6.8 MG/ML ophthalmic solution, , Disp: , Rfl:    hydrochlorothiazide (HYDRODIURIL) 12.5 MG tablet, Take 1 tablet (12.5 mg total) by mouth daily., Disp: 90 tablet, Rfl: 2   hydroxyurea (HYDREA) 500 MG capsule, Take one tablet by mouth on Monday and Thursday, Disp: 12 capsule, Rfl: 6   meclizine (ANTIVERT) 12.5 MG tablet, Take 1 tablet (12.5 mg total) by mouth as  needed. Take as needed for  vertigo/ dizziness (Patient taking differently: Take 12.5 mg by mouth as needed. Take as needed for  vertigo/ dizziness), Disp: 30 tablet, Rfl: 0   metoprolol succinate (TOPROL-XL) 25 MG 24 hr tablet, Take 1 tablet (25 mg total) by mouth daily., Disp: 90 tablet, Rfl: 3   Multiple Vitamins-Minerals (EYE VITAMINS PO), Take 1 tablet by mouth daily., Disp: , Rfl:    omeprazole (PRILOSEC) 20 MG capsule, TAKE 1 CAPSULE BY MOUTH  DAILY, Disp: 90 capsule, Rfl: 3   Polyethyl Glycol-Propyl Glycol (SYSTANE OP), Apply 1-2 drops to eye 4 (four) times daily as needed (dry eyes)., Disp: , Rfl:    Rivaroxaban (XARELTO) 15 MG TABS tablet, Take 1 tablet (15 mg  total) by mouth daily with supper., Disp: 30 tablet, Rfl: 11   sertraline (ZOLOFT) 50 MG tablet, Take 1 tablet (50 mg total) by mouth daily., Disp: 30 tablet, Rfl: 5  Exam: Current vital signs: BP (!) 115/50   Pulse 73   Temp 98.3 F (36.8 C) (Oral)   Resp 20   SpO2 96%  Vital signs in last 24 hours: Temp:  [98.3 F (36.8 C)] 98.3 F (36.8 C) (12/04 1345) Pulse Rate:  [38-88] 73 (12/04 1936) Resp:  [16-23] 20 (12/04 1936) BP: (107-179)/(45-91) 115/50 (12/04 1936) SpO2:  [93 %-100 %] 96 % (12/04 1936) General: Awake alert slightly agitated appearing pulling on her lines HEENT: Normocephalic, facial bruising CVS: Regular rate rhythm Abdomen nontender nondistended Extremities warm well perfused with some bruising Respiratory: Breathing well saturating normally on room air Neurologic exam She is awake, alert, oriented to self.  Could not tell me where she is or what month it is. She is able to follow simple commands Appears extremely restless and agitated Her speech is dysarthric Cranial nerves: Pupils are equal round reactive to light, extraocular movements difficult to assess given her cooperation but she does follow my finger to both sides although prefers to look towards the right.  Inconsistent blink to threat from both sides.  Mild left lower facial weakness. Motor examination with no drift in the left upper extremity, no drift in the right upper extremity.  Weak left lower extremity with drift.  Full strength right lower extremity. Sensory exam: Difficult to assess but seems to be neglecting the right side Coordination also difficult to assess NIH scale 1a Level of Conscious.: 0 1b LOC Questions: 1 1c LOC Commands: 2 2 Best Gaze: 1 3 Visual: 0 4 Facial Palsy: 1 5a Motor Arm - left: 0 5b Motor Arm - Right: 0 6a Motor Leg - Left: 2 6b Motor Leg - Right: 0 7 Limb Ataxia: 0 8 Sensory: 0 9 Best Language: 0 10 Dysarthria: 1 11 Extinct. and Inatten.: 1 TOTAL:  9  Labs I have reviewed labs in epic and the results pertinent to this consultation are:   CBC    Component Value Date/Time   WBC 9.6 11/09/2021 1410   RBC 4.95 11/09/2021 1410   HGB 14.0 11/09/2021 1410   HGB 13.4 07/01/2021 1038   HCT 43.4 11/09/2021 1410   HCT 41.0 07/01/2021 1038   PLT 466 (H) 11/09/2021 1410   PLT 483 (H) 07/01/2021 1038   MCV 87.7 11/09/2021 1410   MCV 86 07/01/2021 1038   MCH 28.3 11/09/2021 1410   MCHC 32.3 11/09/2021 1410   RDW 15.2 11/09/2021 1410   RDW 14.4 07/01/2021 1038   LYMPHSABS 0.7 11/09/2021 1410   LYMPHSABS 0.9 05/19/2021 1052  MONOABS 0.7 11/09/2021 1410   EOSABS 0.0 11/09/2021 1410   EOSABS 0.1 05/19/2021 1052   BASOSABS 0.0 11/09/2021 1410   BASOSABS 0.1 05/19/2021 1052    CMP     Component Value Date/Time   NA 141 11/09/2021 1410   NA 142 07/01/2021 1038   K 4.6 11/09/2021 1410   CL 105 11/09/2021 1410   CO2 26 11/09/2021 1410   GLUCOSE 166 (H) 11/09/2021 1410   BUN 16 11/09/2021 1410   BUN 21 07/01/2021 1038   CREATININE 1.02 (H) 11/09/2021 1410   CALCIUM 10.0 11/09/2021 1410   PROT 6.6 05/19/2021 1052   ALBUMIN 4.6 07/01/2021 1038   AST 22 05/19/2021 1052   ALT 13 05/19/2021 1052   ALKPHOS 70 05/19/2021 1052   BILITOT 0.6 05/19/2021 1052   GFRNONAA 52 (L) 11/09/2021 1410   GFRAA 41 (L) 09/30/2020 1035    Imaging I have reviewed the images obtained:  CT-head-right frontal insular parenchymal hemorrhage with surrounding edema and regional mass-effect including mild leftward shift.  No definitive hydrocephalus.  No IVH.  Also small volume sulcal subarachnoid adjacent to the parenchymal hemorrhage.  Additionally 1.3 cm partially calcified meningioma of the anterior cranial fossa along the cribriform plate.  Assessment:  87 year old with past medical history of atrial fibrillation on Eliquis, hypertension, thrombocytosis presented after a fall and unresponsiveness with a large right frontal ICH-likely secondary to  trauma and coagulopathy. Other differentials to consider are ischemic stroke with hemorrhagic transformation. Being admitted to the ICU for blood pressure management.  Currently on Cleviprex drip. Her coagulopathy with the DOAC was reversed with Andexxa in the emergency room Family not available at bedside at this time. Plan discussed as below with the ED provider.  Impression: Right frontal ICH-secondary to trauma and coagulopathy due to DOAC use  Recommendations: CT head for stability and CTA head neck (plus 5 min delay to look for spot sign as well as to evaluate for vascular malformation) Blood pressure goal less than 140-on Cleviprex drip.  Appreciate management per PCCM. No antiplatelets or anticoagulants MRI of the brain without contrast when able to Stroke team will continue to follow in consultation with you. Plan discussed with Dr. Darl Householder  in the emergency room.   Present on admission: ICH, head and facial trauma, coagulopathy due to DOAC, hypertensive emergency.  -- Amie Portland, MD Neurologist [facility] Pager: [phone]  CRITICAL CARE ATTESTATION Performed by: Amie Portland, MD Total critical care time: 40 minutes Critical care time was exclusive of separately billable procedures and treating other patients and/or supervising APPs/Residents/Students Critical care was necessary to treat or prevent imminent or life-threatening deterioration due to traumatic and coagulopathic ICH This patient is critically ill and at significant risk for neurological worsening and/or death and care requires constant monitoring. Critical care was time spent personally by me on the following activities: development of treatment plan with patient and/or surrogate as well as nursing, discussions with consultants, evaluation of patient's response to treatment, examination of patient, obtaining history from patient or surrogate, ordering and performing treatments and interventions,  ordering and review of laboratory studies, ordering and review of radiographic studies, pulse oximetry, re-evaluation of patient's condition, participation in multidisciplinary rounds and medical decision making of high complexity in the care of this patient.

## 2021-11-09 NOTE — Consult Note (Signed)
Kristy [name] 07/12/1928  [id number].    Requesting MD: Dr. Jeanell Sparrow Chief Complaint/Reason for Consult: fall on Eliquis  HPI:  [name] is a 87 yo female who presented to the ED today after a fall at home. This occurred yesterday while she was walking out to the mailbox. The patient's daughter reports that the patient lives alone, and the patient called her daughter after the incident and reported that she "blacked out" prior to falling. She otherwise had no memory of the event or how she got back inside the house. Her daughter spoke to her again this morning and says she seemed confused, so she came to the ED. She endorsed rib pain. Since arrival her mental status has declined. She is on Eliquis for a history of a-fib. A CT scan showed an intraparenchymal hemorrhage on the right with mild midline shift, as well as a small SAH. Neurosurgery was called recommended nonoperative management, with plans to see the patient tomorrow. Trauma was then consulted. At the time of my exam the patient was very somnolent.  ROS: Review of Systems  Unable to perform ROS: Mental status change   Family History  Problem Relation Age of Onset   Diabetes Mother    Diabetes Sister    Stroke Father    Diabetes Brother    Heart disease Brother    Cancer Sister        lung   Cancer Sister        vulva   Cancer Sister    Dementia Sister    Osteoporosis Sister    Diabetes Sister    Osteoporosis Sister    Diabetes Sister    Cancer Brother        bladder   Colon cancer Neg Hx     Past Medical History:  Diagnosis Date   A-fib [facility])    Cataract    Closed left arm fracture 2016   Colon polyps    Diverticulosis    Endometrial hyperplasia without atypia, simple    Endometrial polyp    Fatty liver    GERD (gastroesophageal reflux disease)    Hiatal hernia    Hyperplastic colon polyp    Hypertension    Lipoma    Obesity    Optic neuropathy of both eyes 1989, 1997   SOB (shortness of breath)     Thrombocytosis     Past Surgical History:  Procedure Laterality Date   breast biopsy     CATARACT EXTRACTION     CHOLECYSTECTOMY  03/2002   DILATION AND CURETTAGE OF UTERUS     x 2   EYE SURGERY     HERNIA REPAIR     KNEE SURGERY     optic neuritis     umbilical hernia repair  09/2002    Social History:  reports that she quit smoking about 38 years ago. Her smoking use included cigarettes. She has never used smokeless tobacco. She reports that she does not drink alcohol and does not use drugs.  Allergies:  Allergies  Allergen Reactions   Codeine Nausea Only   Latanoprost Other (See Comments)    Red and burning   Tramadol Nausea Only    (Not in a hospital admission)    Physical Exam: Blood pressure (!) 115/50, pulse 73, temperature 98.3 F (36.8 C), temperature source Oral, resp. rate 20, SpO2 96 %. General: resting in bed, somnolent Neurological: pupils equal, somnolent, responds to verbal stimulus and pain, no spontaneous eye  opening (GCS 8) HEENT: normocephalic, facial abrasions CV: regular rate and rhythm, no murmurs, extremities warm and well-perfused Respiratory: normal work of breathing, symmetric chest wall expansion Abdomen: soft, nondistended, nontender to deep palpation. No masses or organomegaly, no abdominal wall ecchymoses. Extremities: warm and well-perfused, no deformities, moving all extremities spontaneously Psychiatric: normal mood and affect Skin: warm and dry, no jaundice, no rashes or lesions   Results for orders placed or performed during the hospital encounter of 11/09/21 (from the past 48 hour(s))  Basic metabolic panel     Status: Abnormal   Collection Time: 11/09/21  2:10 PM  Result Value Ref Range   Sodium 141 135 - 145 mmol/L   Potassium 4.6 3.5 - 5.1 mmol/L   Chloride 105 98 - 111 mmol/L   CO2 26 22 - 32 mmol/L   Glucose, Bld 166 (H) 70 - 99 mg/dL    Comment: Glucose reference range applies only to samples taken after fasting for at  least 8 hours.   BUN 16 8 - 23 mg/dL   Creatinine, Ser 1.02 (H) 0.44 - 1.00 mg/dL   Calcium 10.0 8.9 - 10.3 mg/dL   GFR, Estimated 52 (L) >60 mL/min    Comment: (NOTE) Calculated using the CKD-EPI Creatinine Equation (2021)    Anion gap 10 5 - 15    Comment: Performed at [facility] [address], [place], [place] [address]  CBC with Differential     Status: Abnormal   Collection Time: 11/09/21  2:10 PM  Result Value Ref Range   WBC 9.6 4.0 - 10.5 K/uL   RBC 4.95 3.87 - 5.11 MIL/uL   Hemoglobin 14.0 12.0 - 15.0 g/dL   HCT 43.4 36.0 - 46.0 %   MCV 87.7 80.0 - 100.0 fL   MCH 28.3 26.0 - 34.0 pg   MCHC 32.3 30.0 - 36.0 g/dL   RDW 15.2 11.5 - 15.5 %   Platelets 466 (H) 150 - 400 K/uL   nRBC 0.0 0.0 - 0.2 %   Neutrophils Relative % 85 %   Neutro Abs 8.1 (H) 1.7 - 7.7 K/uL   Lymphocytes Relative 7 %   Lymphs Abs 0.7 0.7 - 4.0 K/uL   Monocytes Relative 7 %   Monocytes Absolute 0.7 0.1 - 1.0 K/uL   Eosinophils Relative 0 %   Eosinophils Absolute 0.0 0.0 - 0.5 K/uL   Basophils Relative 0 %   Basophils Absolute 0.0 0.0 - 0.1 K/uL   Immature Granulocytes 1 %   Abs Immature Granulocytes 0.05 0.00 - 0.07 K/uL    Comment: Performed at [facility] [address], [place], [place] [address]  Protime-INR     Status: Abnormal   Collection Time: 11/09/21  2:10 PM  Result Value Ref Range   Prothrombin Time 18.8 (H) 11.4 - 15.2 seconds   INR 1.6 (H) 0.8 - 1.2    Comment: (NOTE) INR goal varies based on device and disease states. Performed at [facility], [place] [address], [place], [place] [address]   APTT     Status: None   Collection Time: 11/09/21  2:10 PM  Result Value Ref Range   aPTT 31 24 - 36 seconds    Comment: Performed at [facility] [address] [place], [place] [address]  Ethanol     Status: None   Collection Time: 11/09/21  3:08 PM  Result Value Ref Range   Alcohol, Ethyl (B) <10 <10 mg/dL  Comment: (NOTE) Lowest detectable limit for  serum alcohol is 10 mg/dL.  For medical purposes only. Performed at [facility], [place] [address] [place], [place] [address]   Resp Panel by RT-PCR (Flu A&B, Covid) Nasopharyngeal Swab     Status: None   Collection Time: 11/09/21  3:41 PM   Specimen: Nasopharyngeal Swab; Nasopharyngeal(NP) swabs in vial transport medium  Result Value Ref Range   SARS Coronavirus 2 by RT PCR NEGATIVE NEGATIVE    Comment: (NOTE) SARS-CoV-2 target nucleic acids are NOT DETECTED.  The SARS-CoV-2 RNA is generally detectable in upper respiratory specimens during the acute phase of infection. The lowest concentration of SARS-CoV-2 viral copies this assay can detect is 138 copies/mL. A negative result does not preclude SARS-Cov-2 infection and should not be used as the sole basis for treatment or other patient management decisions. A negative result may occur with  improper specimen collection/handling, submission of specimen other than nasopharyngeal swab, presence of viral mutation(s) within the areas targeted by this assay, and inadequate number of viral copies(<138 copies/mL). A negative result must be combined with clinical observations, patient history, and epidemiological information. The expected result is Negative.  Fact Sheet for Patients:  EntrepreneurPulse.com.au  Fact Sheet for Healthcare Providers:  IncredibleEmployment.be  This test is no t yet approved or cleared by the [place] FDA and  has been authorized for detection and/or diagnosis of SARS-CoV-2 by FDA under an Emergency Use Authorization (EUA). This EUA will remain  in effect (meaning this test can be used) for the duration of the COVID-19 declaration under Section 564(b)(1) of the Act, 21 U.S.C.section 360bbb-3(b)(1), unless the authorization is terminated  or revoked sooner.       Influenza A by PCR NEGATIVE NEGATIVE   Influenza B by PCR NEGATIVE NEGATIVE    Comment:  (NOTE) The Xpert Xpress SARS-CoV-2/FLU/RSV plus assay is intended as an aid in the diagnosis of influenza from Nasopharyngeal swab specimens and should not be used as a sole basis for treatment. Nasal washings and aspirates are unacceptable for Xpert Xpress SARS-CoV-2/FLU/RSV testing.  Fact Sheet for Patients: EntrepreneurPulse.com.au  Fact Sheet for Healthcare Providers: IncredibleEmployment.be  This test is not yet approved or cleared by the [place] FDA and has been authorized for detection and/or diagnosis of SARS-CoV-2 by FDA under an Emergency Use Authorization (EUA). This EUA will remain in effect (meaning this test can be used) for the duration of the COVID-19 declaration under Section 564(b)(1) of the Act, 21 U.S.C. section 360bbb-3(b)(1), unless the authorization is terminated or revoked.  Performed at [facility], [place] [address] [place], [place] [address]    CT Head Wo Contrast  Addendum Date: 11/09/2021   ADDENDUM REPORT: 11/09/2021 14:49 ADDENDUM: Not included in the initial report, there is small volume sulcal subarachnoid hemorrhage adjacent to the parenchymal hemorrhage. Additionally, there is a 1.3 cm partially calcified meningioma of the anterior cranial fossa along the cribriform plate. Electronically Signed   By: Macy Mis M.D.   On: 11/09/2021 14:49   Result Date: 11/09/2021 CLINICAL DATA:  Facial trauma EXAM: CT HEAD WITHOUT CONTRAST TECHNIQUE: Contiguous axial images were obtained from the base of the skull through the vertex without intravenous contrast. COMPARISON:  None. FINDINGS: Brain: Large area of parenchymal hemorrhage is present in the right frontal lobe including the opercular region as well as the insula and subinsular white matter. This measures approximately 5.5 x 3.8 x 4.6 cm. Surrounding edema is present. There is mass effect  including effacement of the right lateral ventricle and mild 4 mm  leftward midline shift. No definite hydrocephalus. Prominence of ventricles and sulci reflects parenchymal volume loss. Gray-white differentiation is preserved. Patchy hypoattenuation in the supratentorial white matter is nonspecific but probably reflects chronic microvascular ischemic changes. Vascular: There is atherosclerotic calcification at the skull base. Skull: Calvarium is unremarkable. Sinuses/Orbits: No acute finding. Other: None. IMPRESSION: Right frontoinsular parenchymal hemorrhage with surrounding edema and regional mass effect including mild leftward midline shift. No definite hydrocephalus. These results were called by telephone at the time of interpretation on 11/09/2021 at 2:39 pm to provider [facility] , who verbally acknowledged these results. Electronically Signed: By: Macy Mis M.D. On: 11/09/2021 14:41   CT Cervical Spine Wo Contrast  Result Date: 11/09/2021 CLINICAL DATA:  Neck trauma, intoxicated or obtunded (Age >= 16y) EXAM: CT CERVICAL SPINE WITHOUT CONTRAST TECHNIQUE: Multidetector CT imaging of the cervical spine was performed without intravenous contrast. Multiplanar CT image reconstructions were also generated. COMPARISON:  None. FINDINGS: Alignment: Mild anterolisthesis at C7-T1. Skull base and vertebrae: Vertebral body heights are maintained. No acute fracture. Soft tissues and spinal canal: No prevertebral fluid or swelling. No visible canal hematoma. Disc levels: Multilevel degenerative changes are present including disc space narrowing, endplate osteophytes, and facet and uncovertebral hypertrophy. No high-grade osseous encroachment on the spinal canal. Multilevel foraminal stenosis. Upper chest: Minimally included lung apices are clear. Other: Mild calcified plaque along the proximal internal carotids. IMPRESSION: No acute cervical spine fracture. Electronically Signed   By: Macy Mis M.D.   On: 11/09/2021 14:46   CT CHEST ABDOMEN PELVIS W CONTRAST  Result  Date: 11/09/2021 CLINICAL DATA:  Abdominal trauma. EXAM: CT CHEST, ABDOMEN, AND PELVIS WITH CONTRAST TECHNIQUE: Multidetector CT imaging of the chest, abdomen and pelvis was performed following the standard protocol during bolus administration of intravenous contrast. CONTRAST:  124mL OMNIPAQUE IOHEXOL 300 MG/ML  SOLN COMPARISON:  January 28, 2011 FINDINGS: CT CHEST FINDINGS Cardiovascular: Enlarged heart. Calcific atherosclerotic disease of the aorta. No pericardial effusion. Mediastinum/Nodes: Large hiatal hernia contains the stomach, small and large bowel loops. No evidence of incarceration. No evidence of mediastinal lymphadenopathy. Mild distension of the esophagus. Lungs/Pleura: Bibasilar atelectasis. Musculoskeletal: No evidence of fracture. CT ABDOMEN PELVIS FINDINGS Hepatobiliary: No focal liver abnormality is seen. Status post cholecystectomy. No biliary dilatation. Pancreas: Unremarkable. No pancreatic ductal dilatation or surrounding inflammatory changes. Spleen: Normal in size without focal abnormality. Adrenals/Urinary Tract: Normal adrenal glands. Normal right kidney. 8 mm possibly dystrophic calculus within the lower pole of the left kidney, nonobstructive. Normal ureters and urinary bladder. Stomach/Bowel: No evidence of small-bowel obstruction. Diffuse colonic diverticulosis, particularly in the sigmoid colon. No evidence of diverticulitis. Vascular/Lymphatic: Aortic atherosclerosis. No enlarged abdominal or pelvic lymph nodes. Reproductive: Uterus and bilateral adnexa are unremarkable. Other: No abdominal wall hernia or abnormality. No abdominopelvic ascites. Musculoskeletal: No fracture is seen. Scoliosis and spondylosis of the spine. IMPRESSION: 1. No evidence of acute traumatic injury to the chest, abdomen or pelvis. 2. Large hiatal hernia contains the stomach, small and large bowel loops. No evidence of incarceration. 3. Diffuse colonic diverticulosis without evidence of diverticulitis. 4. 8  mm possibly dystrophic calculus within the lower pole of the left kidney, nonobstructive. 5. Enlarged heart. 6. Calcific atherosclerotic disease of the aorta. Aortic Atherosclerosis (ICD10-I70.0). Electronically Signed   By: Fidela Salisbury M.D.   On: 11/09/2021 18:48   DG CHEST PORT 1 VIEW  Result Date: 11/09/2021 CLINICAL DATA:  Cerebral bleeding. EXAM: PORTABLE CHEST  1 VIEW COMPARISON:  June 14, 2020 FINDINGS: Enlarged cardiac silhouette. Calcific atherosclerotic disease and tortuosity of the aorta. Large hiatal hernia. Bibasilar atelectasis. Osseous structures are without acute abnormality. Soft tissues are grossly normal. IMPRESSION: No active disease. Electronically Signed   By: Fidela Salisbury M.D.   On: 11/09/2021 17:57   DG Hand Complete Right  Result Date: 11/09/2021 CLINICAL DATA:  Hand pain after fall. EXAM: RIGHT HAND - COMPLETE 3+ VIEW COMPARISON:  None. FINDINGS: Minimal minimally displaced fracture of the radial styloid process with extension to the radiocarpal joint. There is some osseous irregularity of the distal aspect of the scaphoid. Diffuse demineralization of bone. Chondrocalcinosis with degenerative change of the first [facility] joint, as well as the distal interphalangeal joints with some involvement of the MCPs. Soft tissue swelling in the wrist and proximal hand. IMPRESSION: 1. Minimal minimally displaced fracture of the radial styloid process with extension to the radiocarpal joint. 2. Osseous irregularity of the distal aspect of the scaphoid, which is favored to reflect degenerative change however nondisplaced fracture not entirely excluded. In the setting of anatomic snuffbox tenderness consider additional radiographs with dedicated scaphoid view. 3. Chondrocalcinosis with multifocal degenerative change likely reflecting degenerative osteoarthropathy with possible superimposed CPPD arthropathy. Electronically Signed   By: Dahlia Bailiff M.D.   On: 11/09/2021 16:08   CT  Maxillofacial Wo Contrast  Result Date: 11/09/2021 CLINICAL DATA:  Facial trauma EXAM: CT MAXILLOFACIAL WITHOUT CONTRAST TECHNIQUE: Multidetector CT imaging of the maxillofacial structures was performed. Multiplanar CT image reconstructions were also generated. COMPARISON:  None. FINDINGS: Osseous: No acute facial fracture. Orbits: No intraorbital hematoma.  Bilateral lens replacements. Sinuses: Patchy mucosal thickening. Soft tissues: Right frontal scalp soft tissue swelling/hematoma. Limited intracranial: Dictated separately. IMPRESSION: No acute facial fracture. Electronically Signed   By: Macy Mis M.D.   On: 11/09/2021 14:51      Assessment/Plan 87 yo female s/p fall from standing on Eliquis with intracerebral hemorrhage. - Defer management of intracerebral bleed to neurosurgery. [facility] given in ED. - CT chest/abd/pelvis completed and reviewed. No rib fractures present. Patient has a large diaphragmatic hernia but no intrathoracic or intraabdominal trauma. - Trauma will follow as a consult  Michaelle Birks, [facility], Hepatobiliary and Pancreatic Surgery 11/09/21 8:42 PM

## 2021-11-09 NOTE — ED Notes (Signed)
This RN went to check on pt, advise family member (Amy) that pt had bed ready on 4NICU. This RN also advised that 2 additional visitors wished to visit briefly before pt taken upstairs. Amy advised this RN that she did not want any additional visitors, stated she would be going up w pt. This RN advised that due to supposed [place] issues, there would be no visitors until legal [facility]POA arrives tomorrow. Amy proceeded to become verbally abusive to this RN & staff. Advised by this RN that she had two options in this situation: she could leave of her own accord, or she could be escorted out by security. Pt escorted out by this RN, seen flicking off additional visitors while exiting building. 2 visitors, including pastor, visited briefly w pt, shared prayer & left agreeable w plan of care. Security notified, advised that there will be NO visitors until [employer] arrives from [facility], [place].

## 2021-11-09 NOTE — ED Provider Notes (Addendum)
  Physical Exam  BP (!) 129/50   Pulse 87   Temp 98.3 F (36.8 C) (Oral)   Resp (!) 23   SpO2 95%   Physical Exam  ED Course/Procedures     Procedures  MDM  Patient care assumed at 3 pm. Patient had a head bleed. Dr. Arnoldo Morale consulted. He doesn't recommend surgery. Trauma surgery consulted (Dr. Zenia Resides) and saw patient. She ordered trauma scan which showed no intra thoracic or intra abdominal injuries. She has radial styloid fracture. I talked to Dr. Mardelle Matte and ordered sugar tongue splint.  Since neurosurgery does not recommend surgical intervention, trauma surgery request that I consult ICU.  Patient is on Cleviprex drip to control her blood pressure.  Medical ICU to admit.  7:50 PM ICU request neurology consult. Talked to Dr. Rory Percy from neurology who will put patient on the list to be seen by neurology   CRITICAL CARE Performed by: Wandra Arthurs   Total critical care time: 30 minutes  Critical care time was exclusive of separately billable procedures and treating other patients.  Critical care was necessary to treat or prevent imminent or life-threatening deterioration.  Critical care was time spent personally by me on the following activities: development of treatment plan with patient and/or surrogate as well as nursing, discussions with consultants, evaluation of patient's response to treatment, examination of patient, obtaining history from patient or surrogate, ordering and performing treatments and interventions, ordering and review of laboratory studies, ordering and review of radiographic studies, pulse oximetry and re-evaluation of patient's condition.       Drenda Freeze, MD 11/09/21 [place] Register    Drenda Freeze, MD 11/09/21 [phone]

## 2021-11-09 NOTE — Consult Note (Signed)
I was contacted by Dr. Jeanell Sparrow regarding this patient.  She had previously spoken to Dr. Kathyrn Sheriff who did not recommend surgery and recommend Neurology consult.    Dr. Jeanell Sparrow called again and requested Neurosurgical documentation..   The patient is a 87 year old white female on Xarelto who was found in the driveway this morning.  I reviewed her CT scan which demonstrates a right deep frontal intracerebral hemorrhage with some mass effect.  Her Xarelto has been reversed.    I discussed situation with Dr. Jeanell Sparrow.   Presently the patient does not  need surgery.   I do not think she would benefit from evacuation of this hematoma. Furthermore she is not likely an appropriate surgical candidate.  I recommend she be admitted by the critical care for supportive care.  Dr. Kathyrn Sheriff will follow up with her in the morning.

## 2021-11-10 ENCOUNTER — Inpatient Hospital Stay (HOSPITAL_COMMUNITY): Payer: Medicare Other

## 2021-11-10 DIAGNOSIS — R569 Unspecified convulsions: Secondary | ICD-10-CM | POA: Diagnosis not present

## 2021-11-10 DIAGNOSIS — I629 Nontraumatic intracranial hemorrhage, unspecified: Secondary | ICD-10-CM | POA: Diagnosis not present

## 2021-11-10 LAB — MRSA NEXT GEN BY PCR, NASAL: MRSA by PCR Next Gen: NOT DETECTED

## 2021-11-10 MED ORDER — HYDRALAZINE HCL 20 MG/ML IJ SOLN
10.0000 mg | INTRAMUSCULAR | Status: DC | PRN
  Administered 2021-11-10: 10 mg via INTRAVENOUS
  Filled 2021-11-10: qty 1

## 2021-11-10 MED ORDER — METOPROLOL SUCCINATE ER 25 MG PO TB24
25.0000 mg | ORAL_TABLET | Freq: Every day | ORAL | Status: DC
  Administered 2021-11-10 – 2021-11-14 (×4): 25 mg via ORAL
  Filled 2021-11-10 (×6): qty 1

## 2021-11-10 MED ORDER — HYDROCHLOROTHIAZIDE 12.5 MG PO TABS
12.5000 mg | ORAL_TABLET | Freq: Every day | ORAL | Status: DC
  Administered 2021-11-10 – 2021-11-14 (×5): 12.5 mg via ORAL
  Filled 2021-11-10 (×5): qty 1

## 2021-11-10 MED ORDER — LABETALOL HCL 5 MG/ML IV SOLN
10.0000 mg | INTRAVENOUS | Status: DC | PRN
  Administered 2021-11-10: 10 mg via INTRAVENOUS
  Filled 2021-11-10: qty 4

## 2021-11-10 MED ORDER — LEVETIRACETAM IN NACL 1500 MG/100ML IV SOLN
1500.0000 mg | Freq: Once | INTRAVENOUS | Status: AC
Start: 2021-11-10 — End: 2021-11-10
  Administered 2021-11-10: 1500 mg via INTRAVENOUS
  Filled 2021-11-10: qty 100

## 2021-11-10 MED ORDER — LEVETIRACETAM IN NACL 500 MG/100ML IV SOLN
500.0000 mg | Freq: Two times a day (BID) | INTRAVENOUS | Status: DC
  Administered 2021-11-11 (×2): 500 mg via INTRAVENOUS
  Filled 2021-11-10 (×2): qty 100

## 2021-11-10 MED ORDER — CHLORHEXIDINE GLUCONATE CLOTH 2 % EX PADS
6.0000 | MEDICATED_PAD | Freq: Every day | CUTANEOUS | Status: DC
  Administered 2021-11-10 – 2021-11-15 (×6): 6 via TOPICAL

## 2021-11-10 MED ORDER — ORAL CARE MOUTH RINSE
15.0000 mL | Freq: Two times a day (BID) | OROMUCOSAL | Status: DC
  Administered 2021-11-10 – 2021-11-15 (×11): 15 mL via OROMUCOSAL

## 2021-11-10 NOTE — Progress Notes (Signed)
EEG complete - results pending 

## 2021-11-10 NOTE — Progress Notes (Signed)
Pt started perseverating. Still A+Ox4, but having bouts of confusion and restlessness. Seems to be experiencing hallucinations. MAE as before. Dr Leonie Man aware. Order to obtain EEG to r/o seizure.

## 2021-11-10 NOTE — Plan of Care (Signed)
Informed by Dr. Hortense Ramal that LTM EEG showed PLEDs and BIRDs, no definite sz but hard to reading with artifacts. Will start AED. Will load with keppra 1.5g and followed by 500mg  bid. Continue EEG.   Rosalin Hawking, MD PhD Stroke Neurology 11/10/2021 9:22 PM

## 2021-11-10 NOTE — Progress Notes (Addendum)
STROKE TEAM PROGRESS NOTE   INTERVAL HISTORY Patient is seen in her room with no family at the bedside.  Two days ago, she had an unwitnessed fall in her yard.  Patient has no memory of the fall and states that she must have blacked out.  Her family brought her to the hospital yesterday, where she was found to have a large right temporoparietal ICH with IVH.  Her Xarelto was reversed with Andexxa and she was brought to the ICU.  Of note, she also suffered a right radial styloid fracture during the fall.  Patient appears neurologically quite stable she is sitting up in a bed and follows all commands appropriately and has no focal deficits except left-sided peripheral vision loss..  Blood pressure adequately controlled.  Vitals:   11/10/21 0503 11/10/21 0700 11/10/21 0800 11/10/21 0900  BP: (!) 139/57 (!) 136/58 (!) 155/109 (!) 143/59  Pulse: 76 73 77 66  Resp: 18 15 (!) 23   Temp:   98 F (36.7 C)   TempSrc:   Axillary   SpO2: 92% 95% 91% 95%   CBC:  Recent Labs  Lab 11/09/21 1410  WBC 9.6  NEUTROABS 8.1*  HGB 14.0  HCT 43.4  MCV 87.7  PLT 400*   Basic Metabolic Panel:  Recent Labs  Lab 11/09/21 1410  NA 141  K 4.6  CL 105  CO2 26  GLUCOSE 166*  BUN 16  CREATININE 1.02*  CALCIUM 10.0   Lipid Panel: No results for input(s): CHOL, TRIG, HDL, CHOLHDL, VLDL, LDLCALC in the last 168 hours. HgbA1c: No results for input(s): HGBA1C in the last 168 hours. Urine Drug Screen: No results for input(s): LABOPIA, COCAINSCRNUR, LABBENZ, AMPHETMU, THCU, LABBARB in the last 168 hours.  Alcohol Level  Recent Labs  Lab 11/09/21 1508  ETH <10    IMAGING past 24 hours CT ANGIO HEAD NECK W WO CM  Result Date: 11/09/2021 CLINICAL DATA:  Intracranial hemorrhage EXAM: CT ANGIOGRAPHY HEAD AND NECK TECHNIQUE: Multidetector CT imaging of the head and neck was performed using the standard protocol during bolus administration of intravenous contrast. Multiplanar CT image reconstructions and MIPs  were obtained to evaluate the vascular anatomy. Carotid stenosis measurements (when applicable) are obtained utilizing NASCET criteria, using the distal internal carotid diameter as the denominator. CONTRAST:  37mL OMNIPAQUE IOHEXOL 350 MG/ML SOLN COMPARISON:  11/09/2021 at 2:26 p.m. FINDINGS: CT HEAD FINDINGS Brain: Unchanged appearance of large intraparenchymal hemorrhage centered in the right basal ganglia. Slightly increased amount of blood layering in the occipital horns. Mild leftward midline shift is unchanged. Small anterior cranial fossa meningioma. Skull: The visualized skull base, calvarium and extracranial soft tissues are normal. Sinuses/Orbits: No fluid levels or advanced mucosal thickening of the visualized paranasal sinuses. No mastoid or middle ear effusion. The orbits are normal. CTA NECK FINDINGS SKELETON: There is no bony spinal canal stenosis. No lytic or blastic lesion. OTHER NECK: Normal pharynx, larynx and major salivary glands. No cervical lymphadenopathy. Unremarkable thyroid gland. UPPER CHEST: No pneumothorax or pleural effusion. No nodules or masses. AORTIC ARCH: There is calcific atherosclerosis of the aortic arch. There is no aneurysm, dissection or hemodynamically significant stenosis of the visualized portion of the aorta. Conventional 3 vessel aortic branching pattern. The visualized proximal subclavian arteries are widely patent. RIGHT CAROTID SYSTEM: No dissection, occlusion or aneurysm. Mild atherosclerotic calcification at the carotid bifurcation without hemodynamically significant stenosis. LEFT CAROTID SYSTEM: No dissection, occlusion or aneurysm. Mild atherosclerotic calcification at the carotid bifurcation without hemodynamically  significant stenosis. VERTEBRAL ARTERIES: Left dominant configuration. Both origins are clearly patent. There is no dissection, occlusion or flow-limiting stenosis to the skull base (V1-V3 segments). CTA HEAD FINDINGS POSTERIOR CIRCULATION:  --Vertebral arteries: Normal V4 segments. --Inferior cerebellar arteries: Normal. --Basilar artery: Normal. --Superior cerebellar arteries: Normal. --Posterior cerebral arteries (PCA): Normal. ANTERIOR CIRCULATION: --Intracranial internal carotid arteries: Normal. --Anterior cerebral arteries (ACA): Normal. Both A1 segments are present. Patent anterior communicating artery (a-comm). --Middle cerebral arteries (MCA): Normal. VENOUS SINUSES: As permitted by contrast timing, patent. ANATOMIC VARIANTS: None Review of the MIP images confirms the above findings. IMPRESSION: 1. Unchanged appearance of large intraparenchymal hemorrhage centered in the right basal ganglia with unchanged mild leftward midline shift. 2. Slightly increased amount of blood layering in the occipital horns. 3. No intracranial arterial occlusion or high-grade stenosis. 4. Mild bilateral carotid bifurcation atherosclerosis without hemodynamically significant stenosis by NASCET criteria. Aortic atherosclerosis (ICD10-I70.0). Electronically Signed   By: Ulyses Jarred M.D.   On: 11/09/2021 21:57   CT Head Wo Contrast  Addendum Date: 11/09/2021   ADDENDUM REPORT: 11/09/2021 14:49 ADDENDUM: Not included in the initial report, there is small volume sulcal subarachnoid hemorrhage adjacent to the parenchymal hemorrhage. Additionally, there is a 1.3 cm partially calcified meningioma of the anterior cranial fossa along the cribriform plate. Electronically Signed   By: Macy Mis M.D.   On: 11/09/2021 14:49   Result Date: 11/09/2021 CLINICAL DATA:  Facial trauma EXAM: CT HEAD WITHOUT CONTRAST TECHNIQUE: Contiguous axial images were obtained from the base of the skull through the vertex without intravenous contrast. COMPARISON:  None. FINDINGS: Brain: Large area of parenchymal hemorrhage is present in the right frontal lobe including the opercular region as well as the insula and subinsular white matter. This measures approximately 5.5 x 3.8 x 4.6 cm.  Surrounding edema is present. There is mass effect including effacement of the right lateral ventricle and mild 4 mm leftward midline shift. No definite hydrocephalus. Prominence of ventricles and sulci reflects parenchymal volume loss. Gray-white differentiation is preserved. Patchy hypoattenuation in the supratentorial white matter is nonspecific but probably reflects chronic microvascular ischemic changes. Vascular: There is atherosclerotic calcification at the skull base. Skull: Calvarium is unremarkable. Sinuses/Orbits: No acute finding. Other: None. IMPRESSION: Right frontoinsular parenchymal hemorrhage with surrounding edema and regional mass effect including mild leftward midline shift. No definite hydrocephalus. These results were called by telephone at the time of interpretation on 11/09/2021 at 2:39 pm to provider [facility] , who verbally acknowledged these results. Electronically Signed: By: Macy Mis M.D. On: 11/09/2021 14:41   CT Cervical Spine Wo Contrast  Result Date: 11/09/2021 CLINICAL DATA:  Neck trauma, intoxicated or obtunded (Age >= 16y) EXAM: CT CERVICAL SPINE WITHOUT CONTRAST TECHNIQUE: Multidetector CT imaging of the cervical spine was performed without intravenous contrast. Multiplanar CT image reconstructions were also generated. COMPARISON:  None. FINDINGS: Alignment: Mild anterolisthesis at C7-T1. Skull base and vertebrae: Vertebral body heights are maintained. No acute fracture. Soft tissues and spinal canal: No prevertebral fluid or swelling. No visible canal hematoma. Disc levels: Multilevel degenerative changes are present including disc space narrowing, endplate osteophytes, and facet and uncovertebral hypertrophy. No high-grade osseous encroachment on the spinal canal. Multilevel foraminal stenosis. Upper chest: Minimally included lung apices are clear. Other: Mild calcified plaque along the proximal internal carotids. IMPRESSION: No acute cervical spine fracture.  Electronically Signed   By: Macy Mis M.D.   On: 11/09/2021 14:46   CT CHEST ABDOMEN PELVIS W CONTRAST  Result Date: 11/09/2021  CLINICAL DATA:  Abdominal trauma. EXAM: CT CHEST, ABDOMEN, AND PELVIS WITH CONTRAST TECHNIQUE: Multidetector CT imaging of the chest, abdomen and pelvis was performed following the standard protocol during bolus administration of intravenous contrast. CONTRAST:  147mL OMNIPAQUE IOHEXOL 300 MG/ML  SOLN COMPARISON:  January 28, 2011 FINDINGS: CT CHEST FINDINGS Cardiovascular: Enlarged heart. Calcific atherosclerotic disease of the aorta. No pericardial effusion. Mediastinum/Nodes: Large hiatal hernia contains the stomach, small and large bowel loops. No evidence of incarceration. No evidence of mediastinal lymphadenopathy. Mild distension of the esophagus. Lungs/Pleura: Bibasilar atelectasis. Musculoskeletal: No evidence of fracture. CT ABDOMEN PELVIS FINDINGS Hepatobiliary: No focal liver abnormality is seen. Status post cholecystectomy. No biliary dilatation. Pancreas: Unremarkable. No pancreatic ductal dilatation or surrounding inflammatory changes. Spleen: Normal in size without focal abnormality. Adrenals/Urinary Tract: Normal adrenal glands. Normal right kidney. 8 mm possibly dystrophic calculus within the lower pole of the left kidney, nonobstructive. Normal ureters and urinary bladder. Stomach/Bowel: No evidence of small-bowel obstruction. Diffuse colonic diverticulosis, particularly in the sigmoid colon. No evidence of diverticulitis. Vascular/Lymphatic: Aortic atherosclerosis. No enlarged abdominal or pelvic lymph nodes. Reproductive: Uterus and bilateral adnexa are unremarkable. Other: No abdominal wall hernia or abnormality. No abdominopelvic ascites. Musculoskeletal: No fracture is seen. Scoliosis and spondylosis of the spine. IMPRESSION: 1. No evidence of acute traumatic injury to the chest, abdomen or pelvis. 2. Large hiatal hernia contains the stomach, small and  large bowel loops. No evidence of incarceration. 3. Diffuse colonic diverticulosis without evidence of diverticulitis. 4. 8 mm possibly dystrophic calculus within the lower pole of the left kidney, nonobstructive. 5. Enlarged heart. 6. Calcific atherosclerotic disease of the aorta. Aortic Atherosclerosis (ICD10-I70.0). Electronically Signed   By: Fidela Salisbury M.D.   On: 11/09/2021 18:48   DG CHEST PORT 1 VIEW  Result Date: 11/09/2021 CLINICAL DATA:  Cerebral bleeding. EXAM: PORTABLE CHEST 1 VIEW COMPARISON:  June 14, 2020 FINDINGS: Enlarged cardiac silhouette. Calcific atherosclerotic disease and tortuosity of the aorta. Large hiatal hernia. Bibasilar atelectasis. Osseous structures are without acute abnormality. Soft tissues are grossly normal. IMPRESSION: No active disease. Electronically Signed   By: Fidela Salisbury M.D.   On: 11/09/2021 17:57   DG Hand Complete Right  Result Date: 11/09/2021 CLINICAL DATA:  Hand pain after fall. EXAM: RIGHT HAND - COMPLETE 3+ VIEW COMPARISON:  None. FINDINGS: Minimal minimally displaced fracture of the radial styloid process with extension to the radiocarpal joint. There is some osseous irregularity of the distal aspect of the scaphoid. Diffuse demineralization of bone. Chondrocalcinosis with degenerative change of the first [facility] joint, as well as the distal interphalangeal joints with some involvement of the MCPs. Soft tissue swelling in the wrist and proximal hand. IMPRESSION: 1. Minimal minimally displaced fracture of the radial styloid process with extension to the radiocarpal joint. 2. Osseous irregularity of the distal aspect of the scaphoid, which is favored to reflect degenerative change however nondisplaced fracture not entirely excluded. In the setting of anatomic snuffbox tenderness consider additional radiographs with dedicated scaphoid view. 3. Chondrocalcinosis with multifocal degenerative change likely reflecting degenerative osteoarthropathy with  possible superimposed CPPD arthropathy. Electronically Signed   By: Dahlia Bailiff M.D.   On: 11/09/2021 16:08   CT Maxillofacial Wo Contrast  Result Date: 11/09/2021 CLINICAL DATA:  Facial trauma EXAM: CT MAXILLOFACIAL WITHOUT CONTRAST TECHNIQUE: Multidetector CT imaging of the maxillofacial structures was performed. Multiplanar CT image reconstructions were also generated. COMPARISON:  None. FINDINGS: Osseous: No acute facial fracture. Orbits: No intraorbital hematoma.  Bilateral lens replacements. Sinuses: Patchy mucosal  thickening. Soft tissues: Right frontal scalp soft tissue swelling/hematoma. Limited intracranial: Dictated separately. IMPRESSION: No acute facial fracture. Electronically Signed   By: Macy Mis M.D.   On: 11/09/2021 14:51    PHYSICAL EXAM General: Pleasant elderly Caucasian lady alert, this appearing patient in no acute distress.  Bruising noted on the right side of the face and right hand.  Splint present on right arm   NEURO:  Mental Status: AA&Ox3  Speech/Language: speech is without dysarthria or aphasia.  Naming, repetition, fluency, and comprehension intact.  Cranial Nerves:  II: PERRL. Left visual field cut, does not blink to threat on left III, IV, VI: EOMI. Eyelids elevate symmetrically.  V: Sensation is intact to light touch and symmetrical to face.  VII: Smile is symmetrical.  VIII: hearing intact to voice. IX, X:  Phonation is normal.  XII: tongue is midline without fasciculations. Motor: 5/5 strength to all muscle groups tested. Slight fine action tremor of LUE Sensation- Intact to light touch bilaterally. Extinction absent to light touch to DSS.  Coordination: FTN with slight left sided dysmetria No drift.  Gait- deferred   ASSESSMENT/PLAN Ms. ORIYA KETTERING is a 87 y.o. female with history of atrial fibrillation on Xarelto, HTN, thrombocytosis and GERD presenting with a right frontal and parietal ICH with IVH after an unwitnessed fall in her yard.   The fall was unwitnessed, and patient does not have any recollection of why she fell.  She states that she may have blacked out.  She was taken to the ED the day after her fall by family, where she was found to have a large temporoparietal ICH.  Her Xarelto was reversed with Andexxa, and she was admitted to the ICU.  Of note, she also suffered a right radial styloid fracture, which will be treated non-operatively.  ICH:  right temporoparietal ICH with IVH secondary to trauma vs. Anticoagulation with Xarelto CT head Right frontoinsular IPH with surrounding edema and mild leftward midline shift.  Small volume sulcal SAH and 1.3 cm partially calcified meningioma along the cribiform plate CTA head & neck Unchanged appearance of IPH, increased blood in occipital horns. No intracranial arterial occlusion or high grade stenosis, mild carotid bifurcation atherosclerosis without significant stenosis. MRI  pending 2D Echo EF 55-60%, mild aortic valve regurgitation, PFO cannot be excluded LDL 90 HgbA1c No results found for requested labs within last 26280 hours. VTE prophylaxis - SCDs    Diet   Diet NPO time specified   Xarelto (rivaroxaban) daily prior to admission, now on No antithrombotic, secondary to IPH. Therapy recommendations:  pending Disposition:  pending  Hypertension Home meds:  HCTZ 12.5 mg daily, metoprolol succinate 25 mg daily Unstable Keep SBP 130-150 for the first 24 hours, then <160 Requiring Cleviprex at this time Restart home BP medications, add PRN hydralazine and labetalol Long-term BP goal normotensive  Hyperlipidemia Home meds:  , none LDL 90, goal < 70 High intensity statin deferred due to [place], may start statin at discharge   [place] for Diabetes type II  Home meds:  none HgbA1c pending CBGs No results for input(s): GLUCAP in the last 72 hours.  SSI  Atrial fibrillation Anticoagulation held due to [place] Consider resumption of anticoagulation in one month as  outpatient  Other Stroke Risk Factors Advanced Age >/= 60  Former cigarette smoker  Other Active Problems Thrombocytosis- PLT 466- monitor daily CBC GERD- resume home omeprazole if symptoms occur  Hospital day # [place] ,  MSN, AGACNP-BC [facility] See Amion for schedule and pager information 11/10/2021 11:42 AM   STROKE MD NOTE : I have personally obtained history,examined this patient, reviewed notes, independently viewed imaging studies, participated in medical decision making and plan of care.ROS completed by me personally and pertinent positives fully documented  I have made any additions or clarifications directly to the above note. Agree with note above.  Patient presented with unwitnessed fall with significant facial trauma and right temporal parenchymal hemorrhage possibly hemorrhagic contusion doubt primary parenchymal hematoma versus ischemic stroke.  Continue strict blood pressure control with systolic goal between 371/696 for first 24 hours and then below 160.  Start oral blood pressure medications and use as needed IV labetalol and hydralazine.  Mobilize out of bed.  Therapy consults.  May need to hold anticoagulation for 2 to 4 weeks due to the large size of hematoma before considering resuming it.  No family available at the bedside for discussion.  Discussed with Dr. Lynetta Mare critical care medicine This patient is critically ill and at significant risk of neurological worsening, death and care requires constant monitoring of vital signs, hemodynamics,respiratory and cardiac monitoring, extensive review of multiple databases, frequent neurological assessment, discussion with family, other specialists and medical decision making of high complexity.I have made any additions or clarifications directly to the above note.This critical care time does not reflect procedure time, or teaching time or supervisory time of PA/NP/Med Resident etc but could involve  care discussion time.  I spent 30 minutes of neurocritical care time  in the care of  this patient.      Antony Contras, MD Medical Director [place] Pager: [phone] 11/10/2021 2:23 PM   To contact Stroke Continuity provider, please refer to [email or web address] After hours, contact General Neurology

## 2021-11-10 NOTE — Progress Notes (Signed)
Patient ID: Kristy [name], female   DOB: [birth year], 87 y.o.   MRN: [id number] Trauma F/U  Noted plans by Neurosurgery, Orthopedics, and Neurology. Please re-call Trauma as needed.  Georganna Skeans, MD, MPH, FACS Please use [email or web address] to contact on call provider

## 2021-11-10 NOTE — Procedures (Signed)
Patient Name: Kristy [name]  MRN: [id number]  Epilepsy Attending: Lora Havens  Referring Physician/Provider: Dr Antony Contras Date: 11/10/2021 Duration: 21.35 mins  Patient history: 87yo F with right ICH. EEG to evaluate for seizure  Level of alertness: Awake  AEDs during EEG study: None  Technical aspects: This EEG study was done with scalp electrodes positioned according to the 10-20 International system of electrode placement. Electrical activity was acquired at a sampling rate of 500Hz  and reviewed with a high frequency filter of 70Hz  and a low frequency filter of 1Hz . EEG data were recorded continuously and digitally stored.   Description: No clear posterior dominant rhythm was seen.  EEG showed 5-7hz  theta slowing in left left hemisphere. EEG also showed continuous 3-5hz  theta-delta slowing in right hemisphere.   Lateralized periodic discharges were noted in right hemisphere, maximal right posterior quadrant at 1-1.5 Hz. These discharges at times appeared rhythmic consistent with brief ictal-interictal rhythmic discharges ( BIRDS).    Hyperventilation and photic stimulation were not performed.     ABNORMALITY - Brief ictal-interictal rhythmic discharges, right hemisphere, maximal right posterior quadrant  - Lateralized periodic discharges, right hemisphere, maximal right posterior quadrant   - Continuous slow, generalized and lateralized right hemisphere  IMPRESSION: This study showed evidence of epileptogenicity arising from right hemisphere, maximal right posterior quadrant . This eeg pattern is on the ictal-interictal continuum with high potential for seizures. No definite seizures were seen throughout the recording.  Additionally the study is suggestive of cortical dysfunction arising from right hemisphere likely secondary to underlying structural abnormality/ ICH.  Lastly, this study is suggestive of moderate diffuse encephalopathy, nonspecific etiology.    Dr Lynetta Mare,  Dr Leonie Man and Dr Erlinda Hong was notified.   Hong Timm Barbra Sarks

## 2021-11-10 NOTE — H&P (Signed)
1  NAME:  Kristy [name], MRN:  [id number], DOB:  Feb 08, 1928, LOS: 1 ADMISSION DATE:  11/09/2021, CONSULTATION DATE:  11/09/21 REFERRING MD:  ED , CHIEF COMPLAINT:  Weakness, AMS, hemorrhagic stroke   History of Present Illness:  87 yo woman with hx of afib on xarelto, GERD, hiatal hernia and HTN, here with hemorrhagic stroke, fall from standing on 12/3.  History is not entirely clear, but she had a unwittnessed vs witnessed by neighbors fall from standing (?) yesterday.   Possible loss of consciousness.  Found by family today in her bed with bruises and weakness. Brought by daughter to ED.    On Xarelto for afib  Not able to give much history although she is awake and alert.   Pertinent  Medical History  Afib Cataract Gerd  Hiatal hernia Htn   Significant Hospital Events: Including procedures, antibiotic start and stop dates in addition to other pertinent events   12/4-admitted as, following fall.  Interim History / Subjective:  Somnolent but will wake up and follow commands speech is appropriate.  Objective   Blood pressure (!) 136/58, pulse 73, temperature 98 F (36.7 C), temperature source Axillary, resp. rate 15, SpO2 95 %.        Intake/Output Summary (Last 24 hours) at 11/10/2021 7412 Last data filed at 11/10/2021 0700 Gross per 24 hour  Intake 70.85 ml  Output 350 ml  Net -279.15 ml    There were no vitals filed for this visit.  Examination: General: eccymosis on Upper lip >lower r lip, Awake and alert, follows simple commands  HENT: No apparent trauma to teeth, pupils 18mm and reactive LungsCTAB Cardiovascular: RRR no mgr  Abdomen: nt, nd, nbs  Extremities: non pitting edema in BLE.  Right upper extremity in volar slab Neuro: f awake follows commands speech is mildly dysarthric but comprehensible and appropriate.  She moves all limbs purposefully.  Slight weakness left upper extremity. GU: No Foley catheter.  Minimal urine output  Ancillary tests personally  reviewed:  Normal electrolytes. Creatinine mildly elevated 1.06 Normal CBC.  CT head shows [name] right frontal temporal hematoma with minimal mass-effect. Assessment & Plan:   Critically ill due to hemorrhagic stroke, R frontal intraparenchymal hemorrhage requiring titration of Cleviprex to keep systolic blood pressure less than 160 Neurosurgery consulted.  Non-surgical per their note.   Already given [place].   Appreciate neurology consultation.   CTA head and neck  History of hypertension Atrial fibrillation GERD.  Plan:  Wrist fracture: Ortho consulting, to place splint in AM.   HTN: on cleveprex, resume home antihypertensives and wean to off.  Target systolic blood pressure less than 160. Afib: held [facility] due to bleed.  Kcentra.  Currently rate controlled.   Bedside swallow evaluation.  Initiate oral diet. PT OT. MRI pending.  Once off Cleviprex can likely transition to PCU.   Best Practice (right click and "Reselect all SmartList Selections" daily)   Diet/type: NPO bedside swallow evaluation today DVT prophylaxis: other start chemical prophylaxis tomorrow GI prophylaxis: PPI Home PPI resumed Lines: N/A Foley:  N/A Code Status:  full code Last date of multidisciplinary goals of care discussion [we will update family.]  CRITICAL CARE Performed by: Kipp Brood   Total critical care time: 35  minutes  Critical care time was exclusive of separately billable procedures and treating other patients.  Critical care was necessary to treat or prevent imminent or life-threatening deterioration.  Critical care was time spent personally by me on the following activities:  development of treatment plan with patient and/or surrogate as well as nursing, discussions with consultants, evaluation of patient's response to treatment, examination of patient, obtaining history from patient or surrogate, ordering and performing treatments and interventions, ordering and review of laboratory  studies, ordering and review of radiographic studies, pulse oximetry, re-evaluation of patient's condition and participation in multidisciplinary rounds.  Kipp Brood, MD [facility] ICU Physician [place]  Pager: [phone] Mobile: [phone] After hours: [phone].

## 2021-11-10 NOTE — Consult Note (Addendum)
ORTHOPAEDIC CONSULTATION  REQUESTING PHYSICIAN: Collier Bullock, MD  Chief Complaint: fall   HPI: Kristy [name] is a 87 y.o. female  with history of A. fib on Xarelto, previous left arm fracture, GERD, hiatal hernia, hypertension who had an unwitnessed fall from standing.  She was found by family and brought to the emergency department.  X-rays were taken showing a right radial styloid fracture.  She is somnolent on exam this morning with moaning when I passively move the fingers of her right hand. Not responsive to commands or questioning.   Past Medical History:  Diagnosis Date   A-fib [facility])    Cataract    Closed left arm fracture 2016   Colon polyps    Diverticulosis    Endometrial hyperplasia without atypia, simple    Endometrial polyp    Fatty liver    GERD (gastroesophageal reflux disease)    Hiatal hernia    Hyperplastic colon polyp    Hypertension    Lipoma    Obesity    Optic neuropathy of both eyes 1989, 1997   SOB (shortness of breath)    Thrombocytosis    Past Surgical History:  Procedure Laterality Date   breast biopsy     CATARACT EXTRACTION     CHOLECYSTECTOMY  03/2002   DILATION AND CURETTAGE OF UTERUS     x 2   EYE SURGERY     HERNIA REPAIR     KNEE SURGERY     optic neuritis     umbilical hernia repair  09/2002   Social History   Socioeconomic History   Marital status: Widowed    Spouse name: Not on file   Number of children: 2   Years of education: Not on file   Highest education level: Not on file  Occupational History   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    Employer: RETIRED    Comment: Retired  Tobacco Use   Smoking status: Former    Types: Cigarettes    Quit date: 12/07/1982    Years since quitting: 38.9   Smokeless tobacco: Never  Vaping Use   Vaping Use: Never used  Substance and Sexual Activity   Alcohol use: No   Drug use: No   Sexual activity: Never  Other Topics Concern   Not on file  Social History  Narrative   Son in [place]   Daughter in [place] Strain: Low Risk    Difficulty of Paying Living Expenses: Not hard at all  Food Insecurity: No Food Insecurity   Worried About Charity fundraiser in the Last Year: Never true   Arboriculturist in the Last Year: Never true  Transportation Needs: No Transportation Needs   Lack of Transportation (Medical): No   Lack of Transportation (Non-Medical): No  Physical Activity: Insufficiently Active   Days of Exercise per Week: 7 days   Minutes of Exercise per Session: 10 min  Stress: Stress Concern Present   Feeling of Stress : To some extent  Social Connections: Moderately Integrated   Frequency of Communication with Friends and Family: More than three times a week   Frequency of Social Gatherings with Friends and Family: Twice a week   Attends Religious Services: More than 4 times per year   Active Member of [employer] or Organizations: Yes   Attends Archivist Meetings: 1 to 4 times per year   Marital Status: Widowed   Family  History  Problem Relation Age of Onset   Diabetes Mother    Diabetes Sister    Stroke Father    Diabetes Brother    Heart disease Brother    Cancer Sister        lung   Cancer Sister        vulva   Cancer Sister    Dementia Sister    Osteoporosis Sister    Diabetes Sister    Osteoporosis Sister    Diabetes Sister    Cancer Brother        bladder   Colon cancer Neg Hx    Allergies  Allergen Reactions   Codeine Nausea Only   Latanoprost Other (See Comments)    Red and burning   Tramadol Nausea Only     Positive ROS: All other systems have been reviewed and were otherwise negative with the exception of those mentioned in the HPI and as above.  Physical Exam: General: well developed, well nourished. somnolent Cardiovascular: No pedal edema Respiratory: No cyanosis, no use of accessory musculature GI: No organomegaly, abdomen is soft  and non-tender Skin: Facial ecchymosis and edema. Diffuse dorsal right hand ecchymosis. Small area of ecchymosis at right knee. Neurologic: motor intact to all fingers of right hand, unable to assess sensation. Psychiatric: unable to assess Lymphatic: No axillary or cervical lymphadenopathy  MUSCULOSKELETAL: Right wrist in sugar-tong splint.  Ecchymosis to dorsal aspect of the right hand.  When I move her fingers passively she is able to then flex and extend all fingers of her right hand actively.  Capillary refill intact to all fingers.  Unable to assess sensation.  I have loosened the Ace wrap at her elbow  as this does look to be tight on her skin.  X-rays: Minimally displaced radial styloid fracture  Assessment/Plan: Right non-displaced distal radius fracture - plan to treat non-operatively with splint, follow up with Dr. Mardelle Matte 1 week after discharge - NWB Nixon, PA-C    11/10/2021 7:29 AM

## 2021-11-10 NOTE — Progress Notes (Signed)
LTM maint complete - no skin breakdown or redness seen A1 A2  F3 [facility] monitored, Event button test confirmed by [facility].

## 2021-11-10 NOTE — Progress Notes (Signed)
LTM EEG hooked up and running - no initial skin breakdown - push button tested - neuro notified. [facility] monitoring.  

## 2021-11-11 DIAGNOSIS — R569 Unspecified convulsions: Secondary | ICD-10-CM | POA: Diagnosis not present

## 2021-11-11 DIAGNOSIS — I629 Nontraumatic intracranial hemorrhage, unspecified: Secondary | ICD-10-CM | POA: Diagnosis not present

## 2021-11-11 LAB — CBC
HCT: 37.3 % (ref 36.0–46.0)
Hemoglobin: 12.3 g/dL (ref 12.0–15.0)
MCH: 28.3 pg (ref 26.0–34.0)
MCHC: 33 g/dL (ref 30.0–36.0)
MCV: 85.7 fL (ref 80.0–100.0)
Platelets: 445 10*3/uL — ABNORMAL HIGH (ref 150–400)
RBC: 4.35 MIL/uL (ref 3.87–5.11)
RDW: 15.6 % — ABNORMAL HIGH (ref 11.5–15.5)
WBC: 12 10*3/uL — ABNORMAL HIGH (ref 4.0–10.5)
nRBC: 0 % (ref 0.0–0.2)

## 2021-11-11 LAB — BASIC METABOLIC PANEL
Anion gap: 12 (ref 5–15)
BUN: 26 mg/dL — ABNORMAL HIGH (ref 8–23)
CO2: 25 mmol/L (ref 22–32)
Calcium: 9.2 mg/dL (ref 8.9–10.3)
Chloride: 99 mmol/L (ref 98–111)
Creatinine, Ser: 1.21 mg/dL — ABNORMAL HIGH (ref 0.44–1.00)
GFR, Estimated: 42 mL/min — ABNORMAL LOW (ref >60)
Glucose, Bld: 106 mg/dL — ABNORMAL HIGH (ref 70–99)
Potassium: 3.4 mmol/L — ABNORMAL LOW (ref 3.5–5.1)
Sodium: 136 mmol/L (ref 135–145)

## 2021-11-11 LAB — MAGNESIUM: Magnesium: 1.7 mg/dL (ref 1.7–2.4)

## 2021-11-11 MED ORDER — POTASSIUM CHLORIDE 20 MEQ PO PACK: 40.0000 meq | PACK | Freq: Once | ORAL | Status: DC

## 2021-11-11 MED ORDER — MAGNESIUM SULFATE 2 GM/50ML IV SOLN
2.0000 g | Freq: Once | INTRAVENOUS | Status: AC
  Administered 2021-11-11: 2 g via INTRAVENOUS
  Filled 2021-11-11: qty 50

## 2021-11-11 NOTE — Evaluation (Signed)
Physical Therapy Evaluation Patient Details Name: Kristy [name] MRN: [id number] DOB: May 02, 1928 Today's Date: 11/11/2021  History of Present Illness  Pt is 87 yo female who presents with fall from standing on 12/3 and found to have R temporoparietal ICH with IVH. Pt also with R radial styloid fx. Pt began having some confusion 12/5 and started on Keppra for potential seizures. PMH: Afib on Xarelto, GERD, HTN, hiatal hernia.  Clinical Impression  Pt admitted with above diagnosis. Eval limited by pt hooked up to EEG monitor. Pt's son present from [place]. Pt lives alone, independent at baseline. Pt presents with attention deficits and impulsivity, has difficulty following instructions and switching attention from one task to another. Pt able to come to EOB with min A. Needed assist to mobilize without using RUE. Pt stood EOB with min A +2. Had some difficulty stepping feet to move towards HOB. Will advance mobility when able but at this point recommend SNF as pt not safe to be home alone.  Pt currently with functional limitations due to the deficits listed below (see PT Problem List). Pt will benefit from skilled PT to increase their independence and safety with mobility to allow discharge to the venue listed below.          Recommendations for follow up therapy are one component of a multi-disciplinary discharge planning process, led by the attending physician.  Recommendations may be updated based on patient status, additional functional criteria and insurance authorization.  Follow Up Recommendations Skilled nursing-short term rehab (<3 hours/day)    Assistance Recommended at Discharge Frequent or constant Supervision/Assistance  Functional Status Assessment Patient has had a recent decline in their functional status and demonstrates the ability to make significant improvements in function in a reasonable and predictable amount of time.  Equipment Recommendations  Other (comment) (TBD)     Recommendations for Other Services       Precautions / Restrictions Precautions Precautions: Fall Restrictions Weight Bearing Restrictions: Yes RUE Weight Bearing: Non weight bearing      Mobility  Bed Mobility Overal bed mobility: Needs Assistance Bed Mobility: Supine to Sit;Sit to Supine     Supine to sit: Mod assist Sit to supine: Mod assist   General bed mobility comments: Assist to maintain NWB and mobilize hips to EOB, pt kept trying to use RUE.    Transfers Overall transfer level: Needs assistance   Transfers: Sit to/from Stand Sit to Stand: Min assist;+2 safety/equipment           General transfer comment: HHA +2 for stability with sit to stand EOB. Further mobility NT due to EEG monitor on pt's head    Ambulation/Gait               General Gait Details: able to take steps to [facility] with specific instructions to step L. Had some difficulty pisking up feet and needed min A to maintain balance. Not tested further due to EEG  Stairs            Wheelchair Mobility    Modified Rankin (Stroke Patients Only) Modified Rankin (Stroke Patients Only) Pre-Morbid Rankin Score: No symptoms Modified Rankin: Moderately severe disability     Balance Overall balance assessment: Needs assistance;History of Falls Sitting-balance support: Single extremity supported Sitting balance-Leahy Scale: Fair Sitting balance - Comments: able to maintain sitting with supervision   Standing balance support: Bilateral upper extremity supported Standing balance-Leahy Scale: Poor Standing balance comment: UE support for safety  Pertinent Vitals/Pain Pain Assessment: Faces Faces Pain Scale: Hurts little more Pain Location: RUE generalized discomfort Pain Descriptors / Indicators: Grimacing Pain Intervention(s): Limited activity within patient's tolerance;Monitored during session    Home Living Family/patient expects to be  discharged to:: Skilled nursing facility Living Arrangements: Alone                 Additional Comments: son in room lives in [place]    Prior Function Prior Level of Function : Independent/Modified Independent;Driving             Mobility Comments: occasionally using a cane       Hand Dominance   Dominant Hand: Right    Extremity/Trunk Assessment   Upper Extremity Assessment Upper Extremity Assessment: Defer to OT evaluation RUE Deficits / Details: R wrist fx in splint RUE Coordination: decreased fine motor;decreased gross motor    Lower Extremity Assessment Lower Extremity Assessment: Generalized weakness    Cervical / Trunk Assessment Cervical / Trunk Assessment: Kyphotic  Communication   Communication: HOH  Cognition Arousal/Alertness: Awake/alert Behavior During Therapy: Restless;Flat affect;Impulsive Overall Cognitive Status: Impaired/Different from baseline Area of Impairment: Attention;Following commands;Safety/judgement;Awareness;Problem solving;Memory                   Current Attention Level: Sustained Memory: Decreased short-term memory Following Commands: Follows one step commands inconsistently Safety/Judgement: Decreased awareness of safety;Decreased awareness of deficits Awareness: Emergent Problem Solving: Slow processing General Comments: perseverating on eating. impulsive. easily distracted        General Comments General comments (skin integrity, edema, etc.): L hand edema (IV side), RN notified. Pt pocketing food R cheek, able to swipe with tongue when cued. Son present for eval and seems anxious    Exercises     Assessment/Plan    PT Assessment Patient needs continued PT services  PT Problem List Decreased strength;Decreased activity tolerance;Decreased balance;Decreased mobility;Decreased coordination;Decreased cognition;Decreased knowledge of use of DME;Decreased safety awareness;Decreased knowledge of precautions;Pain        PT Treatment Interventions DME instruction;Gait training;Functional mobility training;Therapeutic activities;Therapeutic exercise;Balance training;Stair training;Cognitive remediation;Patient/family education;Neuromuscular re-education    PT Goals (Current goals can be found in the Care Plan section)  Acute Rehab PT Goals Patient Stated Goal: finish breakfast PT Goal Formulation: With patient/family Time For Goal Achievement: 11/25/21 Potential to Achieve Goals: Good    Frequency Min 3X/week   Barriers to discharge Decreased caregiver support lives alone    Co-evaluation PT/OT/SLP Co-Evaluation/Treatment: Yes Reason for Co-Treatment: Complexity of the patient's impairments (multi-system involvement);Necessary to address cognition/behavior during functional activity;For patient/therapist safety PT goals addressed during session: Mobility/safety with mobility;Balance OT goals addressed during session: ADL's and self-care       AM-PAC PT "6 Clicks" Mobility  Outcome Measure Help needed turning from your back to your side while in a flat bed without using bedrails?: A Little Help needed moving from lying on your back to sitting on the side of a flat bed without using bedrails?: A Little Help needed moving to and from a bed to a chair (including a wheelchair)?: A Little Help needed standing up from a chair using your arms (e.g., wheelchair or bedside chair)?: Total Help needed to walk in hospital room?: Total Help needed climbing 3-5 steps with a railing? : Total 6 Click Score: 12    End of Session Equipment Utilized During Treatment: Gait belt Activity Tolerance: Patient tolerated treatment well Patient left: in bed;with call bell/phone within reach;with bed alarm set;with family/visitor present Nurse Communication: Mobility status PT Visit  Diagnosis: Pain;Difficulty in walking, not elsewhere classified (R26.2);History of falling (Z91.81);Muscle weakness (generalized)  (M62.81) Pain - Right/Left: Right Pain - part of body: Arm    Time: 0347-4259 PT Time Calculation (min) (ACUTE ONLY): 23 min   Charges:   PT Evaluation $PT Eval Moderate Complexity: Mar-[place]  Pager [phone] Office Central 11/11/2021, 4:23 PM

## 2021-11-11 NOTE — Progress Notes (Signed)
Patient's son, who claims he is POA, is expressing concern about patient's daughter visiting and her actions. Per nursing staff and son's report, patient's daughter was seen filming patient in bed with flashlight in face and persuading her to say things yesterday. Daughter also has history of being kicked out from the hospital. I spoke with son in person and we agreed to let her visit but if she films the patient again then we will permanently remove her visitation rights. Will follow up. Leverne Humbles, [place] notified.   Montez Hageman, RN

## 2021-11-11 NOTE — Progress Notes (Addendum)
I was just informed by our unit secretary that the daughter is calling trying to speak with me. I told the secretary that I am no longer allowed to speak with her per the POA's wishes.   Montez Hageman, RN

## 2021-11-11 NOTE — Progress Notes (Signed)
Daughter called back again asking to speak to me. I declined because I am not authorized by patient's POA to give any information about patient or any situation related to the patient to the daughter. Madalyn Rob, RN picked up the phone and reiterated that we are not allowed to give information. Daughter was under the impression that I made the decision to ban her from the hospital. Madalyn Rob explained that the [place] and inquired my clinical judgement on the safety of the patient. Daughter was asked to stop calling the unit to get information and all information she wants about the patient she needs to call the patients son.  Montez Hageman, RN

## 2021-11-11 NOTE — Progress Notes (Addendum)
NAME:  Kristy [name], MRN:  [id number], DOB:  05-Aug-1928, LOS: 2 ADMISSION DATE:  11/09/2021, CONSULTATION DATE:  11/09/21 REFERRING MD: Dr. Darl Householder , CHIEF COMPLAINT:  Weakness, AMS, hemorrhagic stroke   History of Present Illness:  87 y/o woman with hx of afib on xarelto, GERD, hiatal hernia and HTN, here with hemorrhagic stroke, fall from standing on 12/3.  History is not entirely clear, but she had a unwittnessed vs witnessed by neighbors fall from standing (?) yesterday.   Possible loss of consciousness.  Found by family today in her bed with bruises and weakness. Brought by daughter to ED (had to be escorted out of ER).  CT Head consistent with large basal ganglia hemorrhage.  Treated with KCentra for reversal. Also found to be hypertensive requiring Cleviprex for BP management. Additional R radial styloid fracture.   Pertinent  Medical History  Afib Cataract GERD Hiatal hernia HTN  Significant Hospital Events: Including procedures, antibiotic start and stop dates in addition to other pertinent events   12/4 Admitted following fall with ICH, right radial styloid fracture  Interim History / Subjective:  Drowsy this am, wakes to answer questions Tmax 100.2 / WBC 12  Pt denies pain, SOB   Objective   Blood pressure (!) 122/55, pulse (!) 55, temperature 99.3 F (37.4 C), temperature source Axillary, resp. rate (!) 22, SpO2 96 %.        Intake/Output Summary (Last 24 hours) at 11/11/2021 0946 Last data filed at 11/11/2021 0600 Gross per 24 hour  Intake 584.21 ml  Output 1100 ml  Net -515.79 ml   There were no vitals filed for this visit.  Examination: General: frail elderly female lying in bed in NAD HEENT: MM pink/moist, head wrapped with EEG leads in place, bruising to upper lip, right face, anicteric  Neuro: Drowsy, awakens to voice, follows commands, speech clear/mild dysarthria, MAE  CV: s1s2 RRR, no m/r/g PULM: non-labored at rest, lungs bilaterally clear  GI: soft, bsx4  active  Extremities: warm/dry, no edema  Skin: no rashes or lesions  Resolved Hospital Issues      Assessment & Plan:   Hemorrhagic CVA, Right Frontal Intraparenchymal Hemorrhage  S/p KCentra for anticoagulation reversal on admit. MRI with right frontal parenchymal hemorrhage 6.1 x 3.5 x 3.9 cm, surrounding vasogenic edema, 9mm midline shift, minimal subarachnoid or subdural blood along the falx -non-surgical per NSGY, appreciate input   -hold home anticoagulation,-Neurology following  -follow neuro exam Q4 -await swallow evaluation  -PT / OT   Hypertension  AF on Anticoagulation  -SBP <160  -ICU monitoring  -hold anticoagulation, will need to discuss risk/benefit with family and possibility of not restarting  -rate control may be better option here  -tele monitoring  -continue home antihypertensives  -if not more awake to take pills, may need to discuss cortrak with family   Hypomagnesemia  Hypokalemia -monitor, replace K/Mg+ 12/6  GERD -PPI   Right Styloid Fracture  -maintain splint, conservative management    Best Practice (right click and "Reselect all SmartList Selections" daily)  Diet/type: NPO bedside swallow evaluation today DVT prophylaxis: other start chemical prophylaxis tomorrow GI prophylaxis: PPI Home PPI resumed Lines: N/A Foley:  N/A Code Status:  full code Last date of multidisciplinary goals of care discussion: pending [place] with HCPOA (son).    Critical Care Time: n/a   Noe Gens, MSN, APRN, NP-C, AGACNP-BC [facility] 11/11/2021, 9:56 AM   Please see [email or web address] for pager details.   From  7A-7P if no response, please call [phone] After hours, please call ELink [phone]

## 2021-11-11 NOTE — Progress Notes (Signed)
Patient very lethargic. Verbal order from Dr. Lynetta Mare to hold PO meds until around 1030-1100 to see if she wakes up more. Also new verbal order for q4 neuro checks.   Montez Hageman, RN

## 2021-11-11 NOTE — TOC CAGE-AID Note (Signed)
Transition of Care [facility]) - CAGE-AID Screening   Patient Details  Name: Kristy [name] MRN: [id number] Date of Birth: Jun 28, 1928  Transition of Care [facility]) CM/SW Contact:    Pyper Olexa C Tarpley-Carter, [place] Phone Number: 11/11/2021, 9:38 AM   Clinical Narrative: Pt is unable to participate in Cage Aid.  Pt not appropriate for assessment.  Evadean Sproule Tarpley-Carter, MSW, LCSW-A Pronouns:  She/Her/Hers [facility] HealthTransitions of Care Clinical Social Worker Direct Number:  [phone] [email or web address]  CAGE-AID Screening: Substance Abuse Screening unable to be completed due to: : Patient unable to participate (Pt not appropriate for assessment.)             Substance Abuse Education Offered: No
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
## 2021-11-11 NOTE — Evaluation (Signed)
Occupational Therapy Evaluation Patient Details Name: Kristy [name] MRN: [id number] DOB: [birth year] Today's Date: 11/11/2021   History of Present Illness Pt is 87 yo female who presents with fall from standing on 12/3 and found to have R temporoparietal ICH with IVH. Pt also with R radial styloid fx. Pt began having some confusion 12/5 and started on Keppra for potential seizures. PMH: Afib on Xarelto, GERD, HTN, hiatal hernia.   Clinical Impression   PTA pt lives alone and was managing her own ADL/IADL tasks, including driving. Son reports R visual field deficit at baseline. Pt appears to have more impaired vision, in addition to visual perceptual and cognitive deficits as indicated by her decline in functional below.  Currently requires mod A with basic ADL tasks. Difficulty sustaining attention to task at times. Recommend rehab at SNF. Will follow acutely.  Pt will need assistance with self feeding due to below deficits.      Recommendations for follow up therapy are one component of a multi-disciplinary discharge planning process, led by the attending physician.  Recommendations may be updated based on patient status, additional functional criteria and insurance authorization.   Follow Up Recommendations  Skilled nursing-short term rehab (<3 hours/day)    Assistance Recommended at Discharge Frequent or constant Supervision/Assistance  Functional Status Assessment  Patient has had a recent decline in their functional status and demonstrates the ability to make significant improvements in function in a reasonable and predictable amount of time.  Equipment Recommendations       Recommendations for Other Services       Precautions / Restrictions Precautions Precautions: Fall Restrictions Weight Bearing Restrictions: Yes RUE Weight Bearing: Non weight bearing      Mobility Bed Mobility Overal bed mobility: Needs Assistance Bed Mobility: Supine to Sit;Sit to Supine     Supine  to sit: Mod assist Sit to supine: Mod assist   General bed mobility comments: Assist to maintain NWB adn mobilize hips to EOB    Transfers Overall transfer level: Needs assistance   Transfers: Sit to/from Stand Sit to Stand: Min assist;+2 safety/equipment (HHA)                  Balance Overall balance assessment: Needs assistance;History of Falls   Sitting balance-Leahy Scale: Fair       Standing balance-Leahy Scale: Poor                             ADL either performed or assessed with clinical judgement   ADL Overall ADL's : Needs assistance/impaired Eating/Feeding: Minimal assistance Eating/Feeding Details (indicate cue type and reason): significant amount of spillage without assistance Grooming: Moderate assistance   Upper Body Bathing: Moderate assistance   Lower Body Bathing: Maximal assistance   Upper Body Dressing : Maximal assistance   Lower Body Dressing: Maximal assistance   Toilet Transfer: Moderate assistance   Toileting- Clothing Manipulation and Hygiene: Moderate assistance       Functional mobility during ADLs: Moderate assistance       Vision Baseline Vision/History: 1 Wears glasses Vision Assessment?: Vision impaired- to be further tested in functional context Additional Comments: per son pt with R peripheral field deficit at baseline; L new field deficit; vision apparently impared at indicated with difficulty feeding herself adn locating objects     Perception Perception Comments: Decreased attention L; will further assess   Praxis      Pertinent Vitals/Pain Pain Assessment: Faces Faces Pain Scale:  Hurts little more Pain Location: RUE generalized discomfort Pain Descriptors / Indicators: Grimacing Pain Intervention(s): Limited activity within patient's tolerance     Hand Dominance Right   Extremity/Trunk Assessment Upper Extremity Assessment Upper Extremity Assessment: RUE deficits/detail RUE Deficits /  Details: R wrist fx in splint RUE Coordination: decreased fine motor;decreased gross motor   Lower Extremity Assessment Lower Extremity Assessment: Defer to PT evaluation   Cervical / Trunk Assessment Cervical / Trunk Assessment: Kyphotic   Communication Communication Communication: HOH   Cognition Arousal/Alertness: Awake/alert Behavior During Therapy: Restless;Flat affect;Impulsive Overall Cognitive Status: Impaired/Different from baseline Area of Impairment: Attention;Following commands;Safety/judgement;Awareness;Problem solving;Memory                   Current Attention Level: Sustained Memory: Decreased short-term memory Following Commands: Follows one step commands inconsistently Safety/Judgement: Decreased awareness of safety;Decreased awareness of deficits Awareness: Emergent Problem Solving: Slow processing General Comments: perseverating on eating     General Comments  L hand edematous - nsg made aware    Exercises     Shoulder Instructions      Home Living Family/patient expects to be discharged to:: Skilled nursing facility Living Arrangements: Alone                                      Prior Functioning/Environment Prior Level of Function : Independent/Modified Independent;Driving             Mobility Comments: occasionally using a cane          OT Problem List: Decreased strength;Decreased range of motion;Decreased activity tolerance;Impaired balance (sitting and/or standing);Impaired vision/perception;Decreased coordination;Decreased cognition;Decreased safety awareness;Decreased knowledge of use of DME or AE;Decreased knowledge of precautions;Cardiopulmonary status limiting activity;Impaired UE functional use;Pain;Increased edema      OT Treatment/Interventions: Self-care/ADL training;Therapeutic exercise;Neuromuscular education;DME and/or AE instruction;Therapeutic activities;Cognitive  remediation/compensation;Visual/perceptual remediation/compensation;Patient/family education;Balance training    OT Goals(Current goals can be found in the care plan section) Acute Rehab OT Goals Patient Stated Goal: to eat her breakfast OT Goal Formulation: With patient/family Time For Goal Achievement: 11/25/21 Potential to Achieve Goals: Good  OT Frequency: Min 2X/week   Barriers to D/C:            Co-evaluation PT/OT/SLP Co-Evaluation/Treatment: Yes Reason for Co-Treatment: Complexity of the patient's impairments (multi-system involvement);For patient/therapist safety;Necessary to address cognition/behavior during functional activity PT goals addressed during session: Mobility/safety with mobility;Balance OT goals addressed during session: ADL's and self-care      AM-PAC OT "6 Clicks" Daily Activity     Outcome Measure Help from another person eating meals?: A Lot Help from another person taking care of personal grooming?: A Lot Help from another person toileting, which includes using toliet, bedpan, or urinal?: A Lot Help from another person bathing (including washing, rinsing, drying)?: A Lot Help from another person to put on and taking off regular upper body clothing?: A Lot Help from another person to put on and taking off regular lower body clothing?: A Lot 6 Click Score: 12   End of Session Nurse Communication: Mobility status  Activity Tolerance: Other (comment) (Limited by EEG wires) Patient left: in bed;with call bell/phone within reach;with bed alarm set;with family/visitor present;Other (comment) (chair position)  OT Visit Diagnosis: Unsteadiness on feet (R26.81);Other abnormalities of gait and mobility (R26.89);Muscle weakness (generalized) (M62.81);History of falling (Z91.81);Other symptoms and signs involving the nervous system (R29.898);Other symptoms and signs involving cognitive function;Pain Pain - Right/Left: Right Pain -  part of body: Arm                 Time: 1140-1207 OT Time Calculation (min): 27 min Charges:  OT General Charges $OT Visit: 1 Visit OT Evaluation $OT Eval Moderate Complexity: [place], OT/L   Acute OT Clinical Specialist [place] Pager [phone] Office [phone]   [facility] 11/11/2021, 3:44 PM

## 2021-11-11 NOTE — Procedures (Addendum)
Patient Name: Kristy [name]  MRN: [id number]  Epilepsy Attending: Lora Havens  Referring Physician/Provider: Dr Antony Contras Duration: 11/10/2021 1756 to 11/11/2021 1633   Patient history: 87yo F with right ICH. EEG to evaluate for seizure   Level of alertness: Awake, asleep   AEDs during EEG study: LEV   Technical aspects: This EEG study was done with scalp electrodes positioned according to the 10-20 International system of electrode placement. Electrical activity was acquired at a sampling rate of 500Hz  and reviewed with a high frequency filter of 70Hz  and a low frequency filter of 1Hz . EEG data were recorded continuously and digitally stored.    Description: No clear posterior dominant rhythm was seen. Sleep was characterized by sleep spindles (12 to 14 Hz), maximum frontocentral region. EEG showed 5 to 9 Hz theta-alpha activity in left hemisphere. EEG also showed continuous 3-5hz  theta-delta slowing in right hemisphere.    Intermittently, lateralized periodic discharges were noted in right hemisphere, maximal right posterior quadrant at 1-1.5 Hz.    Hyperventilation and photic stimulation were not performed.      ABNORMALITY - Lateralized periodic discharges, right hemisphere, maximal right posterior quadrant   - Continuous slow, generalized and lateralized right hemisphere   IMPRESSION: This study showed evidence of epileptogenicity arising from right hemisphere, maximal right posterior quadrant. This eeg pattern is on the ictal-interictal continuum with intermediate to high potential for seizures. No definite seizures were seen throughout the recording.   Additionally the study is suggestive of cortical dysfunction arising from right hemisphere likely secondary to underlying structural abnormality/ ICH.   Lastly, this study is suggestive of moderate diffuse encephalopathy, nonspecific etiology.     Katherin Ramey Barbra Sarks

## 2021-11-11 NOTE — Progress Notes (Signed)
OT Cancellation Note  Patient Details Name: Kristy [name] MRN: [id number] DOB: [birth year]   Cancelled Treatment:    Reason Eval/Treat Not Completed: Patient at procedure or test/ unavailable (Will attempt later time)  [place], OT/L   Acute OT Clinical Specialist [place] Pager [phone] Office [phone]  11/11/2021, 10:40 AM

## 2021-11-11 NOTE — Progress Notes (Signed)
[facility] ADULT ICU REPLACEMENT PROTOCOL   The patient does apply for the [facility] Adult ICU Electrolyte Replacment Protocol based on the criteria listed below:   1.Exclusion criteria: TCTS patients, ECMO patients, and Dialysis patients 2. Is GFR >/= 30 ml/min? Yes.    Patient's GFR today is 3.4 3. Is SCr </= 2? Yes.   Patient's SCr is 1.21 mg/dL 4. Did SCr increase >/= 0.5 in 24 hours? No. 5.Pt's weight >40kg  Yes.   6. Abnormal electrolyte(s): k 3.4  7. Electrolytes replaced per protocol 8.  Call MD STAT for K+ </= 2.5, Phos </= 1, or Mag </= 1 Physician:    Billy Fischer 11/11/2021 6:36 AM

## 2021-11-11 NOTE — Progress Notes (Signed)
STROKE TEAM PROGRESS NOTE   INTERVAL HISTORY Patient is seen in her room with no family at the bedside.  Patient appears more drowsy and lethargic today but can be aroused and follows simple commands.  She appears disoriented slightly confused.  She was found to have's echolalia and confusion yesterday.  EEG was ordered which showed intermittent lateralized periodic brief discharges in the right hemisphere maximum right posterior quadrant.  She was started on Keppra by Dr. Erlinda Hong overnight and placed on long-term EEG monitoring but no definite clinical or electrographic seizures have been noted. Vitals:   11/11/21 1000 11/11/21 1100 11/11/21 1200 11/11/21 1300  BP: (!) 133/51 (!) 151/84 108/64   Pulse: (!) 49 (!) 59 81 78  Resp: 15 15 (!) 23 16  Temp:   98.7 F (37.1 C)   TempSrc:   Oral   SpO2: 96% 94% 91% 91%   CBC:  Recent Labs  Lab 11/09/21 1410 11/11/21 0317  WBC 9.6 12.0*  NEUTROABS 8.1*  --   HGB 14.0 12.3  HCT 43.4 37.3  MCV 87.7 85.7  PLT 466* 601*   Basic Metabolic Panel:  Recent Labs  Lab 11/09/21 1410 11/11/21 0317  NA 141 136  K 4.6 3.4*  CL 105 99  CO2 26 25  GLUCOSE 166* 106*  BUN 16 26*  CREATININE 1.02* 1.21*  CALCIUM 10.0 9.2  MG  --  1.7   Lipid Panel: No results for input(s): CHOL, TRIG, HDL, CHOLHDL, VLDL, LDLCALC in the last 168 hours. HgbA1c: No results for input(s): HGBA1C in the last 168 hours. Urine Drug Screen: No results for input(s): LABOPIA, COCAINSCRNUR, LABBENZ, AMPHETMU, THCU, LABBARB in the last 168 hours.  Alcohol Level  Recent Labs  Lab 11/09/21 1508  ETH <10    IMAGING past 24 hours EEG adult  Result Date: 11/10/2021 Lora Havens, MD     11/10/2021  9:21 PM Patient Name: Kristy [name] MRN: [id number] Epilepsy Attending: Lora Havens Referring Physician/Provider: Dr Antony Contras Date: 11/10/2021 Duration: 21.35 mins Patient history: 87yo F with right ICH. EEG to evaluate for seizure Level of alertness: Awake AEDs during EEG  study: None Technical aspects: This EEG study was done with scalp electrodes positioned according to the 10-20 International system of electrode placement. Electrical activity was acquired at a sampling rate of 500Hz  and reviewed with a high frequency filter of 70Hz  and a low frequency filter of 1Hz . EEG data were recorded continuously and digitally stored. Description: No clear posterior dominant rhythm was seen. EEG showed 5-7hz  theta slowing in left left hemisphere. EEG also showed continuous 3-5hz  theta-delta slowing in right hemisphere. Lateralized periodic discharges were noted in right hemisphere, maximal right posterior quadrant at 1-1.5 Hz. These discharges at times appeared rhythmic consistent with brief ictal-interictal rhythmic discharges ( BIRDS).  Hyperventilation and photic stimulation were not performed.   ABNORMALITY - Brief ictal-interictal rhythmic discharges, right hemisphere, maximal right posterior quadrant - Lateralized periodic discharges, right hemisphere, maximal right posterior quadrant  - Continuous slow, generalized and lateralized right hemisphere IMPRESSION: This study showed evidence of epileptogenicity arising from right hemisphere, maximal right posterior quadrant . This eeg pattern is on the ictal-interictal continuum with high potential for seizures. No definite seizures were seen throughout the recording. Additionally the study is suggestive of cortical dysfunction arising from right hemisphere likely secondary to underlying structural abnormality/ ICH. Lastly, this study is suggestive of moderate diffuse encephalopathy, nonspecific etiology. Dr Lynetta Mare, Dr Leonie Man and Dr Erlinda Hong was notified. [name]   Overnight EEG with video  Result Date: 11/11/2021 Lora Havens, MD     11/11/2021  9:15 AM Patient Name: Kristy [name] MRN: [id number] Epilepsy Attending: Lora Havens Referring Physician/Provider: Dr Antony Contras Duration: 11/10/2021 1756 to 11/11/2021 0915  Patient  history: 87yo F with right ICH. EEG to evaluate for seizure  Level of alertness: Awake, asleep  AEDs during EEG study: LEV  Technical aspects: This EEG study was done with scalp electrodes positioned according to the 10-20 International system of electrode placement. Electrical activity was acquired at a sampling rate of 500Hz  and reviewed with a high frequency filter of 70Hz  and a low frequency filter of 1Hz . EEG data were recorded continuously and digitally stored.  Description: No clear posterior dominant rhythm was seen. Sleep was characterized by sleep spindles (12 to 14 Hz), maximum frontocentral region. EEG showed 5 to 9 Hz theta-alpha activity in left hemisphere. EEG also showed continuous 3-5hz  theta-delta slowing in right hemisphere.  Intermittently, lateralized periodic discharges were noted in right hemisphere, maximal right posterior quadrant at 1-1.5 Hz.  Hyperventilation and photic stimulation were not performed.    ABNORMALITY - Lateralized periodic discharges, right hemisphere, maximal right posterior quadrant  - Continuous slow, generalized and lateralized right hemisphere  IMPRESSION: This study showed evidence of epileptogenicity arising from right hemisphere, maximal right posterior quadrant. This eeg pattern is on the ictal-interictal continuum with intermediate to high potential for seizures. No definite seizures were seen throughout the recording.  Additionally the study is suggestive of cortical dysfunction arising from right hemisphere likely secondary to underlying structural abnormality/ ICH.  Lastly, this study is suggestive of moderate diffuse encephalopathy, nonspecific etiology.  [place]    PHYSICAL EXAM General: Pleasant elderly Caucasian lady alert, this appearing patient in no acute distress.  Bruising noted on the right side of the face and right hand.  Splint present on right arm   NEURO:  Mental Status: Drowsy lethargic.  Can be aroused with some difficulty  follows simple commands.  Disoriented.   Diminished attention, registration and recall Speech/Language: speech is slightly hypophonic but without dysarthria or aphasia.    Cranial Nerves:  II: PERRL. Left visual field cut, does not blink to threat on left III, IV, VI: EOMI. Eyelids elevate symmetrically.  V: Sensation is intact to light touch and symmetrical to face.  VII: Smile is symmetrical.  VIII: hearing intact to voice. IX, X:  Phonation is normal.  XII: tongue is midline without fasciculations. Motor: 5/5 strength to all muscle groups tested. Slight fine action tremor of LUE Sensation- Intact to light touch bilaterally. Extinction absent to light touch to DSS.  Coordination: FTN with slight left sided dysmetria No drift.  Gait- deferred   ASSESSMENT/PLAN Kristy [name] is a 87 y.o. female with history of atrial fibrillation on Xarelto, HTN, thrombocytosis and GERD presenting with a right frontal and parietal ICH with IVH after an unwitnessed fall in her yard.  The fall was unwitnessed, and patient does not have any recollection of why she fell.  She states that she may have blacked out.  She was taken to the ED the day after her fall by family, where she was found to have a large temporoparietal ICH.  Her Xarelto was reversed with Andexxa, and she was admitted to the ICU.  Of note, she also suffered a right radial styloid fracture, which will be treated non-operatively.  ICH:  right temporoparietal ICH with IVH secondary to  trauma vs. Anticoagulation with Xarelto CT head Right frontoinsular IPH with surrounding edema and mild leftward midline shift.  Small volume sulcal SAH and 1.3 cm partially calcified meningioma along the cribiform plate CTA head & neck Unchanged appearance of IPH, increased blood in occipital horns. No intracranial arterial occlusion or high grade stenosis, mild carotid bifurcation atherosclerosis without significant stenosis. MRI  pending 2D Echo EF 55-60%, mild  aortic valve regurgitation, PFO cannot be excluded LDL 90 HgbA1c No results found for requested labs within last 26280 hours. VTE prophylaxis - SCDs    Diet   Diet regular Room service appropriate? Yes; Fluid consistency: Thin   Xarelto (rivaroxaban) daily prior to admission, now on No antithrombotic, secondary to IPH. Therapy recommendations:  pending Disposition:  pending  Hypertension Home meds:  HCTZ 12.5 mg daily, metoprolol succinate 25 mg daily Unstable Keep SBP 130-150 for the first 24 hours, then <160 Requiring Cleviprex at this time Restart home BP medications, add PRN hydralazine and labetalol Long-term BP goal normotensive  Hyperlipidemia Home meds:  , none LDL 90, goal < 70 High intensity statin deferred due to [place], may start statin at discharge   [place] for Diabetes type II  Home meds:  none HgbA1c pending CBGs No results for input(s): GLUCAP in the last 72 hours.  SSI  Atrial fibrillation Anticoagulation held due to [place] Consider resumption of anticoagulation in one month as outpatient  Other Stroke Risk Factors Advanced Age >/= 41  Former cigarette smoker  Other Active Problems Thrombocytosis- PLT 466- monitor daily CBC GERD- resume home omeprazole if symptoms occur  Hospital day # 2  Patient developed some neurological changes yesterday and EEG suggests PLEDs raising possibility of seizures and she has been started on Keppra but appears sleepy possibly medication effect versus postictal today.  If mental status does not clear will consider repeating CT scan later today.  Continue Keppra for now.  No family available at the bedside for discussion.  Discussed with Dr. Salli Quarry critical care medicine.This patient is critically ill and at significant risk of neurological worsening, death and care requires constant monitoring of vital signs, hemodynamics,respiratory and cardiac monitoring, extensive review of multiple databases, frequent neurological  assessment, discussion with family, other specialists and medical decision making of high complexity.I have made any additions or clarifications directly to the above note.This critical care time does not reflect procedure time, or teaching time or supervisory time of PA/NP/Med Resident etc but could involve care discussion time.  I spent 30 minutes of neurocritical care time  in the care of  this patient.         Antony Contras, MD Medical Director [facility] Stroke Center Pager: [phone] 11/11/2021 1:48 PM   To contact Stroke Continuity provider, please refer to [email or web address] After hours, contact General Neurology

## 2021-11-12 DIAGNOSIS — I629 Nontraumatic intracranial hemorrhage, unspecified: Secondary | ICD-10-CM | POA: Diagnosis not present

## 2021-11-12 LAB — CBC
HCT: 34.9 % — ABNORMAL LOW (ref 36.0–46.0)
Hemoglobin: 12 g/dL (ref 12.0–15.0)
MCH: 29 pg (ref 26.0–34.0)
MCHC: 34.4 g/dL (ref 30.0–36.0)
MCV: 84.3 fL (ref 80.0–100.0)
Platelets: 409 10*3/uL — ABNORMAL HIGH (ref 150–400)
RBC: 4.14 MIL/uL (ref 3.87–5.11)
RDW: 15.5 % (ref 11.5–15.5)
WBC: 10 10*3/uL (ref 4.0–10.5)
nRBC: 0 % (ref 0.0–0.2)

## 2021-11-12 LAB — BASIC METABOLIC PANEL
Anion gap: 10 (ref 5–15)
BUN: 28 mg/dL — ABNORMAL HIGH (ref 8–23)
CO2: 24 mmol/L (ref 22–32)
Calcium: 8.7 mg/dL — ABNORMAL LOW (ref 8.9–10.3)
Chloride: 99 mmol/L (ref 98–111)
Creatinine, Ser: 1.21 mg/dL — ABNORMAL HIGH (ref 0.44–1.00)
GFR, Estimated: 42 mL/min — ABNORMAL LOW (ref >60)
Glucose, Bld: 114 mg/dL — ABNORMAL HIGH (ref 70–99)
Potassium: 3.3 mmol/L — ABNORMAL LOW (ref 3.5–5.1)
Sodium: 133 mmol/L — ABNORMAL LOW (ref 135–145)

## 2021-11-12 MED ORDER — POTASSIUM CHLORIDE 10 MEQ/100ML IV SOLN
10.0000 meq | INTRAVENOUS | Status: AC
  Administered 2021-11-12 (×4): 10 meq via INTRAVENOUS
  Filled 2021-11-12 (×4): qty 100

## 2021-11-12 MED ORDER — HEPARIN SODIUM (PORCINE) 5000 UNIT/ML IJ SOLN
5000.0000 [IU] | Freq: Two times a day (BID) | INTRAMUSCULAR | Status: DC
Start: 2021-11-12 — End: 2021-11-14
  Administered 2021-11-12 – 2021-11-14 (×5): 5000 [IU] via SUBCUTANEOUS
  Filled 2021-11-12 (×5): qty 1

## 2021-11-12 MED ORDER — BETHANECHOL CHLORIDE 10 MG PO TABS
5.0000 mg | ORAL_TABLET | Freq: Three times a day (TID) | ORAL | Status: DC
  Administered 2021-11-12 – 2021-11-14 (×7): 5 mg via ORAL
  Filled 2021-11-12 (×7): qty 1

## 2021-11-12 MED ORDER — LEVETIRACETAM 250 MG PO TABS
250.0000 mg | ORAL_TABLET | Freq: Two times a day (BID) | ORAL | Status: DC
  Administered 2021-11-12 – 2021-11-15 (×7): 250 mg via ORAL
  Filled 2021-11-12 (×8): qty 1

## 2021-11-12 MED ORDER — POTASSIUM CHLORIDE CRYS ER 20 MEQ PO TBCR
20.0000 meq | EXTENDED_RELEASE_TABLET | ORAL | Status: AC
  Administered 2021-11-12 (×2): 20 meq via ORAL
  Filled 2021-11-12 (×2): qty 1

## 2021-11-12 MED ORDER — MELATONIN 3 MG PO TABS
3.0000 mg | ORAL_TABLET | Freq: Every evening | ORAL | Status: DC | PRN
  Administered 2021-11-12 – 2021-11-14 (×3): 3 mg via ORAL
  Filled 2021-11-12 (×3): qty 1

## 2021-11-12 NOTE — Progress Notes (Signed)
K+3.3 ?Replaced per protocol  ?

## 2021-11-12 NOTE — Progress Notes (Signed)
STROKE TEAM PROGRESS NOTE   INTERVAL HISTORY Patient is seen in her room with no family at the bedside.  Patient appears more alert and interactive today.  She is getting a bath.  No new complaints.  Neurological exam unchanged.  Vital signs stable.  No witnessed seizure activity. Vitals:   11/12/21 0900 11/12/21 1000 11/12/21 1100 11/12/21 1245  BP: 131/74 (!) 146/62 (!) 121/100 (!) 117/52  Pulse: 87 80 84   Resp: 18 18 (!) 22   Temp:    98.7 F (37.1 C)  TempSrc:    Oral  SpO2: 90% (!) 75% 96% 98%   CBC:  Recent Labs  Lab 11/09/21 1410 11/11/21 0317 11/12/21 0352  WBC 9.6 12.0* 10.0  NEUTROABS 8.1*  --   --   HGB 14.0 12.3 12.0  HCT 43.4 37.3 34.9*  MCV 87.7 85.7 84.3  PLT 466* 445* 242*   Basic Metabolic Panel:  Recent Labs  Lab 11/11/21 0317 11/12/21 0352  NA 136 133*  K 3.4* 3.3*  CL 99 99  CO2 25 24  GLUCOSE 106* 114*  BUN 26* 28*  CREATININE 1.21* 1.21*  CALCIUM 9.2 8.7*  MG 1.7  --    Lipid Panel: No results for input(s): CHOL, TRIG, HDL, CHOLHDL, VLDL, LDLCALC in the last 168 hours. HgbA1c: No results for input(s): HGBA1C in the last 168 hours. Urine Drug Screen: No results for input(s): LABOPIA, COCAINSCRNUR, LABBENZ, AMPHETMU, THCU, LABBARB in the last 168 hours.  Alcohol Level  Recent Labs  Lab 11/09/21 1508  ETH <10    IMAGING past 24 hours No results found.  PHYSICAL EXAM General: Pleasant elderly Caucasian lady alert, this appearing patient in no acute distress.  Bruising noted on the right side of the face and right hand.  Splint present on right arm   NEURO:  Mental Status: Drowsy lethargic.  Can be aroused with some difficulty follows simple commands.  Disoriented.   Diminished attention, registration and recall Speech/Language: speech is slightly hypophonic but without dysarthria or aphasia.    Cranial Nerves:  II: PERRL. Left visual field cut, does not blink to threat on left III, IV, VI: EOMI. Eyelids elevate symmetrically.  V:  Sensation is intact to light touch and symmetrical to face.  VII: Smile is symmetrical.  VIII: hearing intact to voice. IX, X:  Phonation is normal.  XII: tongue is midline without fasciculations. Motor: 5/5 strength to all muscle groups tested. Slight fine action tremor of LUE Sensation- Intact to light touch bilaterally. Extinction absent to light touch to DSS.  Coordination: FTN with slight left sided dysmetria No drift.  Gait- deferred   ASSESSMENT/PLAN Kristy [name] is a 87 y.o. female with history of atrial fibrillation on Xarelto, HTN, thrombocytosis and GERD presenting with a right frontal and parietal ICH with IVH after an unwitnessed fall in her yard.  The fall was unwitnessed, and patient does not have any recollection of why she fell.  She states that she may have blacked out.  She was taken to the ED the day after her fall by family, where she was found to have a large temporoparietal ICH.  Her Xarelto was reversed with Andexxa, and she was admitted to the ICU.  Of note, she also suffered a right radial styloid fracture, which will be treated non-operatively.  ICH:  right temporoparietal ICH with IVH secondary to trauma vs. Anticoagulation with Xarelto CT head Right frontoinsular IPH with surrounding edema and mild leftward midline shift.  Small volume sulcal SAH and 1.3 cm partially calcified meningioma along the cribiform plate CTA head & neck Unchanged appearance of IPH, increased blood in occipital horns. No intracranial arterial occlusion or high grade stenosis, mild carotid bifurcation atherosclerosis without significant stenosis. MRI  pending 2D Echo EF 55-60%, mild aortic valve regurgitation, PFO cannot be excluded LDL 90 HgbA1c No results found for requested labs within last 26280 hours. VTE prophylaxis - SCDs    Diet   Diet regular Room service appropriate? Yes; Fluid consistency: Thin   Xarelto (rivaroxaban) daily prior to admission, now on No antithrombotic,  secondary to IPH. Therapy recommendations:  pending Disposition:  pending  Hypertension Home meds:  HCTZ 12.5 mg daily, metoprolol succinate 25 mg daily Unstable Keep SBP 130-150 for the first 24 hours, then <160 Requiring Cleviprex at this time Restart home BP medications, add PRN hydralazine and labetalol Long-term BP goal normotensive  Hyperlipidemia Home meds:  , none LDL 90, goal < 70 High intensity statin deferred due to [place], may start statin at discharge   [place] for Diabetes type II  Home meds:  none HgbA1c pending CBGs No results for input(s): GLUCAP in the last 72 hours.  SSI  Atrial fibrillation Anticoagulation held due to [place] Consider resumption of anticoagulation in one month as outpatient  Other Stroke Risk Factors Advanced Age >/= 14  Former cigarette smoker  Other Active Problems Thrombocytosis- PLT 466- monitor daily CBC GERD- resume home omeprazole if symptoms occur  Hospital day # 3  Patient is doing much better and has had no further witnessed seizures.  Continue Keppra for seizure prophylaxis.  Mobilize out of bed.  Therapy consults.  Transfer to neurology floor bed.  No family available at the bedside for discussion.  Discussed with Dr. Kipp Brood critical care medicine.This patient is critically ill and at significant risk of neurological worsening, death and care requires constant monitoring of vital signs, hemodynamics,respiratory and cardiac monitoring, extensive review of multiple databases, frequent neurological assessment, discussion with family, other specialists and medical decision making of high complexity.I have made any additions or clarifications directly to the above note.This critical care time does not reflect procedure time, or teaching time or supervisory time of PA/NP/Med Resident etc but could involve care discussion time.  I spent 30 minutes of neurocritical care time  in the care of  this patient.         Antony Contras,  MD Medical Director [facility] Pager: [phone] 11/12/2021 1:23 PM   To contact Stroke Continuity provider, please refer to [email or web address] After hours, contact General Neurology

## 2021-11-12 NOTE — Progress Notes (Signed)
[place] Progress Note Patient Name: KEYON WINNICK DOB: [birth year] MRN: [id number]   Date of Service  11/12/2021  HPI/Events of Note  Patient is restless and having trouble sleeping. Son requests Melatonin for sleep.  eICU Interventions  Plan: Melatonin 3 mg PO Q HS PRN sleep.      Intervention Category Major Interventions: Other:  Lysle Dingwall 11/12/2021, 11:24 PM

## 2021-11-12 NOTE — Progress Notes (Signed)
   NAME:  Kristy [name], MRN:  [id number], DOB:  1928/04/07, LOS: 3 ADMISSION DATE:  11/09/2021, CONSULTATION DATE:  11/09/21 REFERRING MD: Dr. Darl Householder , CHIEF COMPLAINT:  Weakness, AMS, hemorrhagic stroke   History of Present Illness:  87 y/o woman with hx of afib on xarelto, GERD, hiatal hernia and HTN, here with hemorrhagic stroke, fall from standing on 12/3.  History is not entirely clear, but she had a unwittnessed vs witnessed by neighbors fall from standing (?) yesterday.   Possible loss of consciousness.  Found by family today in her bed with bruises and weakness. Brought by daughter to ED (had to be escorted out of ER).  CT Head consistent with large basal ganglia hemorrhage.  Treated with KCentra for reversal. Also found to be hypertensive requiring Cleviprex for BP management. Additional R radial styloid fracture.   Pertinent  Medical History  Afib Cataract GERD Hiatal hernia HTN  Significant Hospital Events: Including procedures, antibiotic start and stop dates in addition to other pertinent events   12/4 Admitted following fall with ICH, right radial styloid fracture S/p KCentra for anticoagulation reversal on admit. MRI with right frontal parenchymal hemorrhage 6.1 x 3.5 x 3.9 cm, surrounding vasogenic edema, 34mm midline shift, minimal subarachnoid or subdural blood along the falx  Interim History / Subjective:  Much improved today.  Able to eat and taking in adequate hydration. Not enjoying hospital food.   Objective   Blood pressure (!) 149/73, pulse (!) 59, temperature 99.7 F (37.6 C), temperature source Oral, resp. rate 12, SpO2 96 %.        Intake/Output Summary (Last 24 hours) at 11/12/2021 0826 Last data filed at 11/12/2021 0600 Gross per 24 hour  Intake 310.05 ml  Output 650 ml  Net -339.95 ml    There were no vitals filed for this visit.  Examination: General: frail elderly female lying in bed in NAD HEENT: MM pink/moist,  bruising to upper lip, right face,  anicteric  Neuro: more awake awakens to voice, follows commands, speech clear/mild dysarthria, MAE  CV: s1s2 RRR, no m/r/g PULM: non-labored at rest, lungs bilaterally clear  GI: soft, bsx4 active  Extremities: warm/dry, no edema  Skin: no rashes or lesions  Resolved Hospital Issues      Assessment & Plan:   Hemorrhagic CVA, Right Frontal Intraparenchymal Hemorrhage  Hypertension  AF on Anticoagulation  Hypomagnesemia  Hypokalemia GERD Right Styloid Fracture   Plan:   - Ready for transfer.  Orders reconciled and TRH notified - Progressive mobilization - Off anticoagulation for now. Stroke Neurology following - risk benefits of long-term anticoagulation will need to be reevaluated.   Best Practice (right click and "Reselect all SmartList Selections" daily)  Diet/type: Regular DVT prophylaxis: heparin [place] GI prophylaxis: PPI Home PPI resumed Lines: N/A Foley:  N/A Code Status:  full code Last date of multidisciplinary goals of care discussion: pending [place] with HCPOA (son).   Kipp Brood, MD [facility] ICU Physician [place]  Pager: [phone] Or Epic Secure Chat After hours: [phone].  11/12/2021, 8:57 AM

## 2021-11-12 NOTE — Progress Notes (Signed)
Received pt on unit. Pt oriented to unit with bed locked at the lowest position.

## 2021-11-13 DIAGNOSIS — I480 Paroxysmal atrial fibrillation: Secondary | ICD-10-CM

## 2021-11-13 DIAGNOSIS — S52511A Displaced fracture of right radial styloid process, initial encounter for closed fracture: Secondary | ICD-10-CM

## 2021-11-13 LAB — BASIC METABOLIC PANEL
Anion gap: 9 (ref 5–15)
BUN: 21 mg/dL (ref 8–23)
CO2: 22 mmol/L (ref 22–32)
Calcium: 9.1 mg/dL (ref 8.9–10.3)
Chloride: 101 mmol/L (ref 98–111)
Creatinine, Ser: 0.98 mg/dL (ref 0.44–1.00)
GFR, Estimated: 54 mL/min — ABNORMAL LOW (ref >60)
Glucose, Bld: 111 mg/dL — ABNORMAL HIGH (ref 70–99)
Potassium: 4.1 mmol/L (ref 3.5–5.1)
Sodium: 132 mmol/L — ABNORMAL LOW (ref 135–145)

## 2021-11-13 LAB — CBC WITH DIFFERENTIAL/PLATELET
Abs Immature Granulocytes: 0.08 10*3/uL — ABNORMAL HIGH (ref 0.00–0.07)
Basophils Absolute: 0.1 10*3/uL (ref 0.0–0.1)
Basophils Relative: 1 %
Eosinophils Absolute: 0.1 10*3/uL (ref 0.0–0.5)
Eosinophils Relative: 1 %
HCT: 38.6 % (ref 36.0–46.0)
Hemoglobin: 13.1 g/dL (ref 12.0–15.0)
Immature Granulocytes: 1 %
Lymphocytes Relative: 11 %
Lymphs Abs: 1.2 10*3/uL (ref 0.7–4.0)
MCH: 28.9 pg (ref 26.0–34.0)
MCHC: 33.9 g/dL (ref 30.0–36.0)
MCV: 85.2 fL (ref 80.0–100.0)
Monocytes Absolute: 1.8 10*3/uL — ABNORMAL HIGH (ref 0.1–1.0)
Monocytes Relative: 17 %
Neutro Abs: 7.6 10*3/uL (ref 1.7–7.7)
Neutrophils Relative %: 69 %
Platelets: 419 10*3/uL — ABNORMAL HIGH (ref 150–400)
RBC: 4.53 MIL/uL (ref 3.87–5.11)
RDW: 15.4 % (ref 11.5–15.5)
WBC: 10.9 10*3/uL — ABNORMAL HIGH (ref 4.0–10.5)
nRBC: 0 % (ref 0.0–0.2)

## 2021-11-13 LAB — MAGNESIUM: Magnesium: 2.1 mg/dL (ref 1.7–2.4)

## 2021-11-13 NOTE — Care Management Important Message (Signed)
Important Message  Patient Details  Name: Kristy [name] MRN: [id number] Date of Birth: 10-03-1928   Medicare Important Message Given:  Yes     Patches Mcdonnell 11/13/2021, 1:55 PM

## 2021-11-13 NOTE — Plan of Care (Signed)
  Problem: Education: Goal: Knowledge of disease or condition will improve Outcome: Progressing Goal: Knowledge of secondary prevention will improve (SELECT ALL) Outcome: Progressing Goal: Knowledge of patient specific risk factors will improve (INDIVIDUALIZE FOR PATIENT) Outcome: Progressing Goal: Individualized Educational Video(s) Outcome: Progressing   Problem: Intracerebral Hemorrhage Tissue Perfusion: Goal: Complications of Intracerebral Hemorrhage will be minimized Outcome: Progressing

## 2021-11-13 NOTE — Progress Notes (Signed)
[facility] Progress Note Patient Name: NAYZETH ALTMAN DOB: [birth year] MRN: [id number]   Date of Service  11/13/2021  HPI/Events of Note  Nursing request for AM lab orders.   eICU Interventions  Plan: CBC with platelets, BMP and Mg++ level at 5 AM.     Intervention Category Major Interventions: Other:  Lysle Dingwall 11/13/2021, 12:24 AM

## 2021-11-13 NOTE — Progress Notes (Signed)
Occupational Therapy Treatment Patient Details Name: Kristy [name] MRN: [id number] DOB: 12-Feb-1928 Today's Date: 11/13/2021   History of present illness Pt is 87 yo female who presents with fall from standing on 12/3 and found to have R temporoparietal ICH with IVH. Pt also with R radial styloid fx. Pt began having some confusion 12/5 and started on Keppra for potential seizures. PMH: Afib on Xarelto, GERD, HTN, hiatal hernia.   OT comments  Pt making good progress with ADL performance and functional mobility this session. Pt requiring min A for all standing ADL's for safety and overall balance. Pt's strength, cognition, balance, and functional mobility has much improved from previous sessions. OT will continue to follow acutely and recommending SNF.   Recommendations for follow up therapy are one component of a multi-disciplinary discharge planning process, led by the attending physician.  Recommendations may be updated based on patient status, additional functional criteria and insurance authorization.    Follow Up Recommendations  Skilled nursing-short term rehab (<3 hours/day)    Assistance Recommended at Discharge Frequent or constant Supervision/Assistance  Equipment Recommendations  None recommended by OT    Recommendations for Other Services      Precautions / Restrictions Precautions Precautions: Fall Required Braces or Orthoses: Splint/Cast Splint/Cast: R UE Restrictions Weight Bearing Restrictions: Yes RUE Weight Bearing: Non weight bearing       Mobility Bed Mobility Overal bed mobility: Needs Assistance Bed Mobility: Supine to Sit;Sit to Supine     Supine to sit: Min guard Sit to supine: Min assist   General bed mobility comments: Pt able to come tositting with no assist, requring minA to bring BLE back into bed    Transfers Overall transfer level: Needs assistance Equipment used: Rolling walker (2 wheels) Transfers: Sit to/from Stand Sit to Stand: Min  assist Stand pivot transfers: Mod assist         General transfer comment: Increased time to stand and steady herself using RW.     Balance Overall balance assessment: Needs assistance Sitting-balance support: Feet supported Sitting balance-Leahy Scale: Fair Sitting balance - Comments: min A for safety due to leaning forward and sleepy initially   Standing balance support: Bilateral upper extremity supported Standing balance-Leahy Scale: Poor Standing balance comment: UE support and external support for balance                           ADL either performed or assessed with clinical judgement   ADL Overall ADL's : Needs assistance/impaired     Grooming: Wash/dry hands;Moderate assistance;Standing Grooming Details (indicate cue type and reason): Pt needing increased assist for safety and balance, completed activity at sink                 Toilet Transfer: Minimal assistance;Stand-pivot Toilet Transfer Details (indicate cue type and reason): Pt able to take a few steps and turn with min A.         Functional mobility during ADLs: Minimal assistance;Rolling walker (2 wheels) General ADL Comments: Pt with increased balance, cognition, increased strength, and functional mobility.    Extremity/Trunk Assessment              Vision       Perception     Praxis      Cognition Arousal/Alertness: Awake/alert Behavior During Therapy: WFL for tasks assessed/performed Overall Cognitive Status: Impaired/Different from baseline Area of Impairment: Memory;Safety/judgement;Problem solving  Current Attention Level: Sustained Memory: Decreased short-term memory Following Commands: Follows one step commands with increased time Safety/Judgement: Decreased awareness of safety;Decreased awareness of deficits   Problem Solving: Slow processing;Decreased initiation;Requires verbal cues;Requires tactile cues General Comments: Pt impulsive,  requiring increased assist for safety, Pt did not recognize her neice who has been with her all day, continually reporting that she was someone else.          Exercises     Shoulder Instructions       General Comments stood at sink to brush teeth with min to mod A; incontinent of urine so assisted to bathroom to change gown, panties and socks, pt able to perform toilet hygiene with A due to R wrist fx.  Patient's son present and needed to problem solve through plan for d/c.    Pertinent Vitals/ Pain       Pain Assessment: No/denies pain Faces Pain Scale: No hurt  Home Living                                          Prior Functioning/Environment              Frequency  Min 2X/week        Progress Toward Goals  OT Goals(current goals can now be found in the care plan section)  Progress towards OT goals: Progressing toward goals  Acute Rehab OT Goals Patient Stated Goal: to go to rehab OT Goal Formulation: With patient/family Time For Goal Achievement: 11/25/21 Potential to Achieve Goals: Good ADL Goals Pt Will Perform Eating: with set-up Pt Will Perform Grooming: with set-up Pt Will Perform Upper Body Bathing: with min guard assist Pt Will Perform Lower Body Bathing: with min guard assist Pt Will Transfer to Toilet: with min guard assist;ambulating Pt Will Perform Toileting - Clothing Manipulation and hygiene: with min guard assist Additional ADL Goal #1: Pt will demosntrate selective attention in non-distracting enviornement in preparation for ADL tasks  Plan Discharge plan remains appropriate;Frequency remains appropriate    Co-evaluation                 AM-PAC OT "6 Clicks" Daily Activity     Outcome Measure   Help from another person eating meals?: A Lot Help from another person taking care of personal grooming?: A Little Help from another person toileting, which includes using toliet, bedpan, or urinal?: A Lot Help from another  person bathing (including washing, rinsing, drying)?: A Lot Help from another person to put on and taking off regular upper body clothing?: A Lot Help from another person to put on and taking off regular lower body clothing?: A Lot 6 Click Score: 13    End of Session Equipment Utilized During Treatment: Gait belt;Rolling walker (2 wheels)  OT Visit Diagnosis: Unsteadiness on feet (R26.81);Other abnormalities of gait and mobility (R26.89);Muscle weakness (generalized) (M62.81);History of falling (Z91.81);Other symptoms and signs involving the nervous system (R29.898);Other symptoms and signs involving cognitive function;Pain Pain - Right/Left: Right Pain - part of body: Arm   Activity Tolerance Patient tolerated treatment well   Patient Left in bed;with call bell/phone within reach;with bed alarm set;with family/visitor present   Nurse Communication Mobility status        Time: 9357-0177 OT Time Calculation (min): 32 min  Charges: OT General Charges $OT Visit: 1 Visit OT Treatments $Self Care/Home Management : 8-22 mins $Therapeutic Activity:  8-22 mins  Kenslei Hearty H., OTR/L Acute Rehabilitation  Kallum Jorgensen Elane Yolanda Bonine 11/13/2021, 7:02 PM

## 2021-11-13 NOTE — TOC Initial Note (Signed)
Transition of Care [facility]) - Initial/Assessment Note    Patient Details  Name: Kristy [name] MRN: [id number] Date of Birth: Jan 22, 1928  [facility] CM/SW Contact:    Geralynn Ochs, LCSW Phone Number: 11/13/2021, 1:32 PM  Clinical Narrative:        CSW met with son, Shanon Brow, at bedside to discuss recommendation for SNF. Shanon Brow had asked about CIR, and CSW discussed how that wasn't recommended, but Shanon Brow asked if that could be revisited since she's improving. CSW to message therapy tomorrow about son's request. CSW provided son with CMS list for both [place] for him to review, and explained that patient would likely be ready for SNF in the next day or two, to start reviewing options available. Shanon Brow appreciative of information. CSW to follow.           Expected Discharge Plan: Skilled Nursing Facility Barriers to Discharge: Continued Medical Work up, Ship broker   Patient Goals and CMS Choice Patient states their goals for this hospitalization and ongoing recovery are:: patient unable to participate in goal setting, only oriented to self CMS Medicare.gov Compare Post Acute Care list provided to:: Patient Represenative (must comment) Choice offered to / list presented to : [facility] POA / Guardian, Adult Children  Expected Discharge Plan and Services Expected Discharge Plan: [place] Choice: [place] arrangements for the past 2 months: Single Family Home                                      Prior Living Arrangements/Services Living arrangements for the past 2 months: Single Family Home Lives with:: Self Patient language and need for interpreter reviewed:: No Do you feel safe going back to the place where you live?: Yes      Need for Family Participation in Patient Care: Yes (Comment) Care giver support system in place?: No (comment)   Criminal Activity/Legal Involvement Pertinent  to Current Situation/Hospitalization: No - Comment as needed  Activities of Daily Living      Permission Sought/Granted Permission sought to share information with : Facility Sport and exercise psychologist, Family Supports Permission granted to share information with : Yes, Verbal Permission Granted  Share Information with NAME: Shanon Brow  Permission granted to share info w AGENCY: SNF  Permission granted to share info w Relationship: Son/HCPOA     Emotional Assessment   Attitude/Demeanor/Rapport: Unable to Assess Affect (typically observed): Unable to Assess Orientation: : Oriented to Self Alcohol / Substance Use: Not Applicable Psych Involvement: No (comment)  Admission diagnosis:  Intracranial hemorrhage (HCC) [I62.9] Chronic anticoagulation [Z79.01] Rib pain [R07.81] Chronic atrial fibrillation (HCC) [I48.20] Intraparenchymal hemorrhage of brain ([place]) [I61.9] Fall, initial encounter [W19.XXXA] Fall from ground level [W18.30XA] Traumatic hemorrhage of right cerebrum with unknown loss of consciousness status, initial encounter [S06.34AA] Patient Active Problem List   Diagnosis Date Noted   Intracranial hemorrhage ([place]) 11/09/2021   Intraparenchymal hemorrhage of brain ([place]) 11/09/2021   Endometrial hyperplasia without atypia, simple 10/03/2021   Feared complaint without diagnosis 08/22/2020   Atrial flutter ([place]) 01/29/2020   Educated about COVID-19 virus infection 01/29/2020   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   Drug rash 09/25/2019   Panic disorder 08/23/2019   Chemotherapy follow-up examination 06/30/2019   Hypnopompic hallucination 06/30/2019   Colon polyps 06/29/2019   Diverticulosis 06/29/2019   Fatty liver 06/29/2019   Optic neuritis  06/29/2019   Non-rheumatic tricuspid valve insufficiency 07/15/2018   Tick bite 05/10/2017   Thoracic aortic atherosclerosis ([place]) 94/32/7614   Metabolic syndrome 70/92/9574   JAK2 V617F mutation 01/24/2016   Essential thrombocytosis  ([place]) 01/24/2016   Closed Colles' fracture of left radius with routine healing 11/21/2015   PAF (paroxysmal atrial fibrillation) ([place]) 12/18/2013   Cervical spondylosis without myelopathy 04/27/2013   ABDOMINAL PAIN, UNSPECIFIED SITE 02/02/2011   Palpitations 02/26/2010   COLONIC POLYPS, HYPERPLASTIC 12/07/2007   LIPOMA 12/07/2007   OBESITY 12/07/2007   Essential hypertension 12/07/2007   GERD 12/07/2007   HIATAL HERNIA 12/07/2007   DIVERTICULOSIS, COLON 12/07/2007   FATTY LIVER DISEASE 12/07/2007   ENDOMETRIAL POLYP 12/07/2007   COMPLEX ENDOMETRIAL HYPERPLASIA WITHOUT ATYPIA 12/07/2007   SOB 12/07/2007   Benign endometrial hyperplasia 12/07/2007   Hiatal hernia 12/07/2007   PCP:  [name], DO Pharmacy:   [place], [place] - [address] [address] Phone: [phone] Fax: [phone]  [facility] ([place], [place] [facility] [address] [place] [address] [place] [address] Phone: [phone] Fax: [phone]     Social Determinants of Health (SDOH) Interventions    Readmission Risk Interventions No flowsheet data found.

## 2021-11-13 NOTE — Progress Notes (Signed)
Physical Therapy Treatment Patient Details Name: Kristy [name] MRN: [id number] DOB: 07-May-1928 Today's Date: 11/13/2021   History of Present Illness Pt is 87 yo female who presents with fall from standing on 12/3 and found to have R temporoparietal ICH with IVH. Pt also with R radial styloid fx. Pt began having some confusion 12/5 and started on Keppra for potential seizures. PMH: Afib on Xarelto, GERD, HTN, hiatal hernia.    PT Comments    Patient progressing with in room ambulation today and participating in standing ADL's.  Feel she will continue to benefit from skilled PT in the acute setting and follow up STSNF at d/c.    Recommendations for follow up therapy are one component of a multi-disciplinary discharge planning process, led by the attending physician.  Recommendations may be updated based on patient status, additional functional criteria and insurance authorization.  Follow Up Recommendations  Skilled nursing-short term rehab (<3 hours/day)     Assistance Recommended at Discharge Frequent or constant Supervision/Assistance  Equipment Recommendations  Other (comment)    Recommendations for Other Services       Precautions / Restrictions Precautions Precautions: Fall Required Braces or Orthoses: Splint/Cast Splint/Cast: R UE Restrictions RUE Weight Bearing: Non weight bearing     Mobility  Bed Mobility Overal bed mobility: Needs Assistance Bed Mobility: Supine to Sit     Supine to sit: HOB elevated;Mod assist     General bed mobility comments: increased time and assist due to pt lethargic initially and needed help to keep her awake    Transfers Overall transfer level: Needs assistance Equipment used: 1 person hand held assist Transfers: Sit to/from Stand;Bed to chair/wheelchair/BSC Sit to Stand: Mod assist Stand pivot transfers: Mod assist         General transfer comment: lifting help to stand and assist to steady and keep from using R UE     Ambulation/Gait Ambulation/Gait assistance: Mod assist Gait Distance (Feet): 15 Feet (&5) Assistive device: 1 person hand held assist Gait Pattern/deviations: Step-to pattern;Narrow base of support;Shuffle;Drifts right/left;Trunk flexed;Decreased stride length       General Gait Details: flexed posture throughout and groping with R UE to hold onto sink, etc, initially HHA to chair/sink/bathroom, then back to bed with pt's L arm over PT shoulder for more support   Stairs             Wheelchair Mobility    Modified Rankin (Stroke Patients Only) Modified Rankin (Stroke Patients Only) Pre-Morbid Rankin Score: No symptoms Modified Rankin: Moderately severe disability     Balance Overall balance assessment: Needs assistance Sitting-balance support: Feet supported Sitting balance-Leahy Scale: Poor Sitting balance - Comments: min A for safety due to leaning forward and sleepy initially   Standing balance support: Single extremity supported Standing balance-Leahy Scale: Poor Standing balance comment: UE support and external support for balance                            Cognition Arousal/Alertness: Awake/alert Behavior During Therapy: Restless Overall Cognitive Status: Impaired/Different from baseline Area of Impairment: Attention;Following commands;Safety/judgement;Awareness;Problem solving;Memory                   Current Attention Level: Sustained Memory: Decreased short-term memory Following Commands: Follows one step commands with increased time Safety/Judgement: Decreased awareness of safety;Decreased awareness of deficits   Problem Solving: Slow processing;Decreased initiation;Requires verbal cues;Requires tactile cues          Exercises  General Comments General comments (skin integrity, edema, etc.): stood at sink to brush teeth with min to mod A; incontinent of urine so assisted to bathroom to change gown, panties and socks, pt  able to perform toilet hygiene with A due to R wrist fx.  Patient's son present and needed to problem solve through plan for d/c.      Pertinent Vitals/Pain Faces Pain Scale: No hurt    Home Living                          Prior Function            PT Goals (current goals can now be found in the care plan section) Progress towards PT goals: Progressing toward goals    Frequency    Min 3X/week      PT Plan Current plan remains appropriate    Co-evaluation              AM-PAC PT "6 Clicks" Mobility   Outcome Measure  Help needed turning from your back to your side while in a flat bed without using bedrails?: A Little Help needed moving from lying on your back to sitting on the side of a flat bed without using bedrails?: A Little Help needed moving to and from a bed to a chair (including a wheelchair)?: A Little Help needed standing up from a chair using your arms (e.g., wheelchair or bedside chair)?: A Lot Help needed to walk in hospital room?: Total Help needed climbing 3-5 steps with a railing? : Total 6 Click Score: 13    End of Session Equipment Utilized During Treatment: Gait belt Activity Tolerance: Patient tolerated treatment well Patient left: in bed;with call bell/phone within reach;with bed alarm set;with family/visitor present   PT Visit Diagnosis: Pain;Difficulty in walking, not elsewhere classified (R26.2);History of falling (Z91.81);Muscle weakness (generalized) (M62.81) Pain - Right/Left: Right Pain - part of body: Arm     Time: 1500-1534 PT Time Calculation (min) (ACUTE ONLY): 34 min  Charges:  $Gait Training: 8-22 mins $Therapeutic Activity: 8-22 mins                     Kristy [name], PT Acute Rehabilitation Services Pager:[phone] Office:[phone] 11/13/2021    Kristy [name] 11/13/2021, 5:25 PM

## 2021-11-13 NOTE — Progress Notes (Signed)
[name] per MD order.  Discussed with the patient and all questions fully answered.  VSS, Skin clean, dry and intact without evidence of skin break down, no evidence of skin tears noted. IV catheter discontinued intact. Site without signs and symptoms of complications. Dressing and pressure applied.  An After Visit Summary was printed and given to the PTAR to give to receiving nurse at the SNF.   Patient instructed to return to ED, call 911, or call MD for any changes in condition.   Patient escorted via [facility] and D/C SNF via private Ambulance  Dorris Carnes 11/13/2021 4:39 PM

## 2021-11-13 NOTE — Progress Notes (Addendum)
STROKE TEAM PROGRESS NOTE   INTERVAL HISTORY Patient in bed, her niece is at the bedside. Patient is lethargic and not wanting to eat lunch.  No new complaints.  Neurological exam unchanged.  Vital signs stable.  No witnessed seizure activity.  Vitals:   11/13/21 0325 11/13/21 0500 11/13/21 0855 11/13/21 1144  BP: 133/65  (!) 162/72 (!) 149/59  Pulse: 82  60 60  Resp: 16  19 18   Temp: 98.2 F (36.8 C)  98.4 F (36.9 C) 98.1 F (36.7 C)  TempSrc: Oral  Oral Axillary  SpO2: 95%  97% 97%  Weight:  60.2 kg     CBC:  Recent Labs  Lab 11/09/21 1410 11/11/21 0317 11/12/21 0352 11/13/21 0400  WBC 9.6   < > 10.0 10.9*  NEUTROABS 8.1*  --   --  7.6  HGB 14.0   < > 12.0 13.1  HCT 43.4   < > 34.9* 38.6  MCV 87.7   < > 84.3 85.2  PLT 466*   < > 409* 419*   < > = values in this interval not displayed.    Basic Metabolic Panel:  Recent Labs  Lab 11/11/21 0317 11/12/21 0352 11/13/21 0400  NA 136 133* 132*  K 3.4* 3.3* 4.1  CL 99 99 101  CO2 25 24 22   GLUCOSE 106* 114* 111*  BUN 26* 28* 21  CREATININE 1.21* 1.21* 0.98  CALCIUM 9.2 8.7* 9.1  MG 1.7  --  2.1    Alcohol Level  Recent Labs  Lab 11/09/21 1508  ETH <10       PHYSICAL EXAM General: Pleasant elderly Caucasian lady lethargic, in no acute distress.  Bruising noted on the right side of the face and right hand.  Splint present on right arm   NEURO:  Mental Status: Drowsy lethargic.  Can be aroused, follows simple commands.  Disoriented.   Diminished attention, registration and recall Speech/Language: speech is slightly hypophonic but without dysarthria or aphasia.    Cranial Nerves:  II: PERRL. Left visual field cut, does not blink to threat on left III, IV, VI: EOMI. Eyelids elevate symmetrically.  V: Sensation is intact to light touch and symmetrical to face.  VII: Smile is symmetrical.  VIII: hearing intact to voice. IX, X:  Phonation is normal.  XII: tongue is midline without fasciculations. Motor:  5/5 strength to all muscle groups tested. Slight fine action tremor of LUE Sensation- Intact to light touch bilaterally. Extinction absent to light touch to DSS.  Coordination: FTN with slight left sided dysmetria No drift.  Gait- deferred   ASSESSMENT/PLAN [name] is a 87 y.o. female with history of atrial fibrillation on Xarelto, HTN, thrombocytosis and GERD presenting with a right frontal and parietal ICH with IVH after an unwitnessed fall in her yard.  The fall was unwitnessed, and patient does not have any recollection of why she fell.  She states that she may have blacked out.  She was taken to the ED the day after her fall by family, where she was found to have a large temporoparietal ICH.  Her Xarelto was reversed with Andexxa, and she was admitted to the ICU.  Of note, she also suffered a right radial styloid fracture, which will be treated non-operatively.  ICH:  right temporoparietal ICH with IVH secondary to trauma vs. Anticoagulation with Xarelto CT head Right frontoinsular IPH with surrounding edema and mild leftward midline shift.  Small volume sulcal SAH and 1.3 cm partially  calcified meningioma along the cribiform plate  CTA head & neck Unchanged appearance of IPH, increased blood in occipital horns. No intracranial arterial occlusion or high grade stenosis, mild carotid bifurcation atherosclerosis without significant stenosis.  MRI   1. Similar appearance of right frontal parenchymal hemorrhage measuring 6.1 x 3.5 x 3.9 cm. 2. Study is moderately degraded by patient motion, limiting detail. 3. Surrounding vasogenic edema and mass effect with 3 mm midline shift. 4. Small amount of intraventricular blood now evident within the posterior horns of the lateral ventricles bilaterally. No obstruction is present. 5. Minimal subarachnoid or subdural blood along the falx. 6. The meningioma along the plane of sphenoidale is not well seen due to patient motion.  2D Echo EF  55-60%, mild aortic valve regurgitation, PFO cannot be excluded LDL 90 HgbA1c No results found for requested labs within last 26280 hours. VTE prophylaxis - SCDs    Diet   Diet regular Room service appropriate? Yes; Fluid consistency: Thin   Xarelto (rivaroxaban) daily prior to admission, now on No antithrombotic, secondary to IPH. Therapy recommendations:  SNF Disposition:  pending  Hypertension Home meds:  HCTZ 12.5 mg daily, metoprolol succinate 25 mg daily Unstable Keep SBP 130-150 for the first 24 hours, then <160 Requiring Cleviprex at this time Restart home BP medications, add PRN hydralazine and labetalol Long-term BP goal normotensive  Hyperlipidemia Home meds:  , none LDL 90, goal < 70 High intensity statin deferred due to [place], may start statin at discharge   [place] for Diabetes type II  Home meds:  none HgbA1c pending CBGs No results for input(s): GLUCAP in the last 72 hours.  SSI  Atrial fibrillation Anticoagulation held due to [place] Consider resumption of anticoagulation in one month as outpatient  Other Stroke Risk Factors Advanced Age >/= 77  Former cigarette smoker  Other Active Problems Thrombocytosis- PLT 466- monitor daily CBC GERD- resume home omeprazole if symptoms occur  Neurology will sign off, please call for concerns or questions  Hospital day # [place], NP   Continue Keppra for seizures.  Continue ongoing therapies and transfer to rehab when bed available.  Long discussion with patient and niece at the bedside and answered questions.  Greater than 50% time during this 25-minute visit was spent in counseling and coordination of care and discussion with patient and family and answering questions.  Stroke team will sign off.  Kindly call for questions.  Discussed with Dr. Margaretha Glassing, MD To contact Stroke Continuity provider, please refer to [email or web address] After hours, contact General Neurology

## 2021-11-13 NOTE — Progress Notes (Signed)
[facility] PROGRESS NOTE   Kristy [name] OVF:[id number] DOB: 1928/06/10 DOA: 11/09/2021  PCP: [name], DO  Brief History/Interval Summary: 87 y/o woman with hx of afib on xarelto, GERD, hiatal hernia and HTN, here with hemorrhagic stroke, fall from standing on 12/3.  History is not entirely clear, but she had a unwittnessed vs witnessed by neighbors fall from standing (?) yesterday.   Possible loss of consciousness.  Found by family today in her bed with bruises and weakness. Brought by daughter to ED (had to be escorted out of ER).  CT Head consistent with large basal ganglia hemorrhage.  Treated with KCentra for reversal. Also found to be hypertensive requiring Cleviprex for BP management. Additional R radial styloid fracture.    Consultants: Neurology  Procedures: None    Subjective/Interval History: Patient somnolent this morning but easily arousable.  Denies any headaches.  No chest pain or shortness of breath.  Denies any significant pain in the right arm.    Assessment/Plan:  Hemorrhagic stroke Noted to have a right frontal intraparenchymal hemorrhage.  Patient was seen by neurosurgery and not thought to be a candidate for surgical intervention. Patient was admitted to neuro ICU.  Seen by neurology.  She was on Xarelto and her anticoagulation was reversed. No anticoagulation or antithrombotic medications at this time. Required Cleviprex in the ICU. MRI showed stability of the intracranial hemorrhage. LDL was noted to be 90. There was also concern for seizure activity based on EEG.  Patient is currently on [place].  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  Currently on Keppra.  Essential hypertension Continue hydrochlorothiazide and metoprolol.  Blood pressure reasonably well controlled.  Occasional high readings noted.  History of paroxysmal atrial fibrillation Was on Xarelto previously.  Now with intracranial hemorrhage anticoagulation is on  hold.  Heart rate is well controlled.  Continue beta-blocker.  Right-sided radial styloid fracture. Seen by Dr. Mardelle Matte with orthopedics.  Plan is for outpatient follow-up in 1 week.  Pain seems to be reasonably well controlled.  Hyponatremia/hypokalemia Sodium level is stable.  Potassium improved.  Magnesium was 2.1.   DVT Prophylaxis: SCDs Code Status: Full code Family Communication: No family at bedside Disposition Plan: Skilled nursing facility as recommended  Status is: Inpatient  Remains inpatient appropriate because: Acute intracranial hemorrhage     Medications: Scheduled:  bethanechol  5 mg Oral TID   Chlorhexidine Gluconate Cloth  6 each Topical Daily   heparin injection (subcutaneous)  5,000 Units Subcutaneous Q12H   hydrochlorothiazide  12.5 mg Oral Daily   levETIRAcetam  250 mg Oral BID   mouth rinse  15 mL Mouth Rinse BID   metoprolol succinate  25 mg Oral Daily   Continuous: ACZ:YSAYTKZS sodium, hydrALAZINE, labetalol, melatonin, ondansetron (ZOFRAN) IV, polyethylene glycol  Antibiotics: Anti-infectives (From admission, onward)    None       Objective:  Vital Signs  Vitals:   11/13/21 0045 11/13/21 0325 11/13/21 0500 11/13/21 0855  BP: (!) 141/54 133/65  (!) 162/72  Pulse: 65 82  60  Resp: 17 16  19   Temp: 97.9 F (36.6 C) 98.2 F (36.8 C)  98.4 F (36.9 C)  TempSrc:  Oral  Oral  SpO2: 97% 95%  97%  Weight:   60.2 kg     Intake/Output Summary (Last 24 hours) at 11/13/2021 1023 Last data filed at 11/13/2021 0954 Gross per 24 hour  Intake 360 ml  Output --  Net 360 ml   Filed Weights   11/13/21  0500  Weight: 60.2 kg    General appearance: Somnolent but easily arousable.  In no distress Resp: Clear to auscultation bilaterally.  Normal effort Cardio: S1-S2 is normal regular.  No S3-S4.  No rubs murmurs or bruit GI: Abdomen is soft.  Nontender nondistended.  Bowel sounds are present normal.  No masses organomegaly Extremities: No  edema.  Right arm is in a splint Neurologic:   No focal neurological deficits.    Lab Results:  Data Reviewed: I have personally reviewed following labs and imaging studies  CBC: Recent Labs  Lab 11/09/21 1410 11/11/21 0317 11/12/21 0352 11/13/21 0400  WBC 9.6 12.0* 10.0 10.9*  NEUTROABS 8.1*  --   --  7.6  HGB 14.0 12.3 12.0 13.1  HCT 43.4 37.3 34.9* 38.6  MCV 87.7 85.7 84.3 85.2  PLT 466* 445* 409* 419*    Basic Metabolic Panel: Recent Labs  Lab 11/09/21 1410 11/11/21 0317 11/12/21 0352 11/13/21 0400  NA 141 136 133* 132*  K 4.6 3.4* 3.3* 4.1  CL 105 99 99 101  CO2 26 25 24 22   GLUCOSE 166* 106* 114* 111*  BUN 16 26* 28* 21  CREATININE 1.02* 1.21* 1.21* 0.98  CALCIUM 10.0 9.2 8.7* 9.1  MG  --  1.7  --  2.1    GFR: Estimated Creatinine Clearance: 31.9 mL/min (by C-G formula based on SCr of 0.98 mg/dL).   Coagulation Profile: Recent Labs  Lab 11/09/21 1410  INR 1.6*     Recent Results (from the past 240 hour(s))  Resp Panel by RT-PCR (Flu A&B, Covid) Nasopharyngeal Swab     Status: None   Collection Time: 11/09/21  3:41 PM   Specimen: Nasopharyngeal Swab; Nasopharyngeal(NP) swabs in vial transport medium  Result Value Ref Range Status   SARS Coronavirus 2 by RT PCR NEGATIVE NEGATIVE Final    Comment: (NOTE) SARS-CoV-2 target nucleic acids are NOT DETECTED.  The SARS-CoV-2 RNA is generally detectable in upper respiratory specimens during the acute phase of infection. The lowest concentration of SARS-CoV-2 viral copies this assay can detect is 138 copies/mL. A negative result does not preclude SARS-Cov-2 infection and should not be used as the sole basis for treatment or other patient management decisions. A negative result may occur with  improper specimen collection/handling, submission of specimen other than nasopharyngeal swab, presence of viral mutation(s) within the areas targeted by this assay, and inadequate number of viral copies(<138  copies/mL). A negative result must be combined with clinical observations, patient history, and epidemiological information. The expected result is Negative.  Fact Sheet for Patients:  EntrepreneurPulse.com.au  Fact Sheet for Healthcare Providers:  IncredibleEmployment.be  This test is no t yet approved or cleared by the [place] FDA and  has been authorized for detection and/or diagnosis of SARS-CoV-2 by FDA under an Emergency Use Authorization (EUA). This EUA will remain  in effect (meaning this test can be used) for the duration of the COVID-19 declaration under Section 564(b)(1) of the Act, 21 U.S.C.section 360bbb-3(b)(1), unless the authorization is terminated  or revoked sooner.       Influenza A by PCR NEGATIVE NEGATIVE Final   Influenza B by PCR NEGATIVE NEGATIVE Final    Comment: (NOTE) The Xpert Xpress SARS-CoV-2/FLU/RSV plus assay is intended as an aid in the diagnosis of influenza from Nasopharyngeal swab specimens and should not be used as a sole basis for treatment. Nasal washings and aspirates are unacceptable for Xpert Xpress SARS-CoV-2/FLU/RSV testing.  Fact Sheet for Patients: EntrepreneurPulse.com.au  Fact Sheet for Healthcare Providers: IncredibleEmployment.be  This test is not yet approved or cleared by the [place] FDA and has been authorized for detection and/or diagnosis of SARS-CoV-2 by FDA under an Emergency Use Authorization (EUA). This EUA will remain in effect (meaning this test can be used) for the duration of the COVID-19 declaration under Section 564(b)(1) of the Act, 21 U.S.C. section 360bbb-3(b)(1), unless the authorization is terminated or revoked.  Performed at [facility], [place] [address], [place], [place] [address]   MRSA Next Gen by PCR, Nasal     Status: None   Collection Time: 11/10/21 12:10 AM   Specimen: Nasal Mucosa; Nasal Swab  Result  Value Ref Range Status   MRSA by PCR Next Gen NOT DETECTED NOT DETECTED Final    Comment: (NOTE) The GeneXpert MRSA Assay (FDA approved for NASAL specimens only), is one component of a comprehensive MRSA colonization surveillance program. It is not intended to diagnose MRSA infection nor to guide or monitor treatment for MRSA infections. Test performance is not FDA approved in patients less than 2 years old. Performed at [facility], [place] [address], [place], [place] [address]       Radiology Studies: No results found.     LOS: 4 days   Kristy [name] [employer] on [email or web address]  11/13/2021, 10:23 AM

## 2021-11-13 NOTE — TOC Progression Note (Addendum)
Transition of Care [facility]) - Progression Note    Patient Details  Name: KINDRED HEYING MRN: [id number] Date of Birth: 1928/06/29  Transition of Care [facility]) CM/SW [place], [place] Phone Number: 11/13/2021, 1:34 PM  Clinical Narrative:   CSW faxed out referral for SNF this morning and spoke with patient's son, Shanon Brow, to provide bed offers. David expressing feeling overwhelmed with everything going on and saying he can't make a decision so quick. CSW informed Shanon Brow that patient is ready for discharge, so a SNF bed needs to be chosen. Shanon Brow again asked about CIR, and CSW discussed how the patient likely would not be able to tolerate the intensity, and Shanon Brow in agreement. CSW emailed Shanon Brow the list of bed offers. CSW awaiting decision on SNF choice to initiate insurance authorization process. CSW to follow.  UPDATE 4:20 PM: CSW met with Shanon Brow at bedside to discuss SNF options. Shanon Brow said the top choice is [facility], but they don't have anything available right now. Next choice would be [facility], and that's really where they want to be able to get the patient in at discharge. Shanon Brow says he's looking at other options but really doesn't want to have to go further down the list than that. CSW contacted [facility] to review referral, they will look at it tomorrow morning. Shanon Brow asking to speak with MD, concerned about her lethargy today and whether she's ready for discharge. CSW sent message to MD about request. CSW to follow.    Expected Discharge Plan: [place] Barriers to Discharge: Continued Medical Work up, Ship broker  Expected Discharge Plan and Services Expected Discharge Plan: [place] Choice: [place] arrangements for the past 2 months: Single Family Home                                       Social Determinants of Health (SDOH) Interventions    Readmission Risk  Interventions No flowsheet data found.

## 2021-11-13 NOTE — NC FL2 (Signed)
[place] LEVEL OF CARE SCREENING TOOL     IDENTIFICATION  Patient Name: Kristy [name] Birthdate: 25-Feb-1928 Sex: female Admission Date (Current Location): 11/09/2021  [facility] and [place] Number:  Herbalist and Address:  [facility] [place], [place] [address] [place], [place] [address]      Provider Number: [id number]  Attending Physician Name and Address:  Bonnielee Haff, MD  Relative Name and Phone Number:       Current Level of Care: Hospital Recommended Level of Care: [place] Prior Approval Number:    Date Approved/Denied:   PASRR Number: [id number]  Discharge Plan: SNF    Current Diagnoses: Patient Active Problem List   Diagnosis Date Noted   Intracranial hemorrhage ([place]) 11/09/2021   Intraparenchymal hemorrhage of brain ([place]) 11/09/2021   Endometrial hyperplasia without atypia, simple 10/03/2021   Feared complaint without diagnosis 08/22/2020   Atrial flutter ([place]) 01/29/2020   Educated about COVID-19 virus infection 01/29/2020   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   Drug rash 09/25/2019   Panic disorder 08/23/2019   Chemotherapy follow-up examination 06/30/2019   Hypnopompic hallucination 06/30/2019   Colon polyps 06/29/2019   Diverticulosis 06/29/2019   Fatty liver 06/29/2019   Optic neuritis 06/29/2019   Non-rheumatic tricuspid valve insufficiency 07/15/2018   Tick bite 05/10/2017   Thoracic aortic atherosclerosis ([place]) 94/58/5929   Metabolic syndrome 24/46/2863   JAK2 V617F mutation 01/24/2016   Essential thrombocytosis ([place]) 01/24/2016   Closed Colles' fracture of left radius with routine healing 11/21/2015   PAF (paroxysmal atrial fibrillation) ([place]) 12/18/2013   Cervical spondylosis without myelopathy 04/27/2013   ABDOMINAL PAIN, UNSPECIFIED SITE 02/02/2011   Palpitations 02/26/2010   COLONIC POLYPS, HYPERPLASTIC 12/07/2007   LIPOMA 12/07/2007   OBESITY 12/07/2007   Essential hypertension  12/07/2007   GERD 12/07/2007   HIATAL HERNIA 12/07/2007   DIVERTICULOSIS, COLON 12/07/2007   FATTY LIVER DISEASE 12/07/2007   ENDOMETRIAL POLYP 12/07/2007   COMPLEX ENDOMETRIAL HYPERPLASIA WITHOUT ATYPIA 12/07/2007   SOB 12/07/2007   Benign endometrial hyperplasia 12/07/2007   Hiatal hernia 12/07/2007    Orientation RESPIRATION BLADDER Height & Weight     Self  Normal Incontinent Weight: 132 lb 11.5 oz (60.2 kg) Height:     BEHAVIORAL SYMPTOMS/MOOD NEUROLOGICAL BOWEL NUTRITION STATUS    Convulsions/Seizures Incontinent Diet (Regular)  AMBULATORY STATUS COMMUNICATION OF NEEDS Skin   Limited Assist Verbally Bruising                       Personal Care Assistance Level of Assistance  Bathing, Feeding, Dressing Bathing Assistance: Limited assistance Feeding assistance: Limited assistance Dressing Assistance: Limited assistance     Functional Limitations Info  Hearing   Hearing Info: Impaired (hard of hearing)      [place]  PT (By licensed PT), OT (By licensed OT)     PT Frequency: 5x/wk OT Frequency: 5x/wk            Contractures Contractures Info: Not present    Additional Factors Info  Code Status, Allergies Code Status Info: Full Allergies Info: Codeine, Latanoprost, Tramadol           Current Medications (11/13/2021):  This is the current hospital active medication list Current Facility-Administered Medications  Medication Dose Route Frequency Provider Last Rate Last Admin   bethanechol (URECHOLINE) tablet 5 mg  5 mg Oral TID Kipp Brood, MD   5 mg at 11/13/21 0940   Chlorhexidine Gluconate Cloth  2 % PADS 6 each  6 each Topical Daily Collier Bullock, MD   6 each at 11/13/21 0941   docusate sodium (COLACE) capsule 100 mg  100 mg Oral BID PRN Collier Bullock, MD       heparin injection 5,000 Units  5,000 Units Subcutaneous Q12H Kipp Brood, MD   5,000 Units at 11/13/21 0941   hydrALAZINE (APRESOLINE) injection 10 mg  10  mg Intravenous Q4H PRN de Yolanda Manges, Cortney E, NP   10 mg at 11/10/21 1129   hydrochlorothiazide (HYDRODIURIL) tablet 12.5 mg  12.5 mg Oral Daily Agarwala, Einar Grad, MD   12.5 mg at 11/13/21 0940   labetalol (NORMODYNE) injection 10 mg  10 mg Intravenous Q2H PRN Kipp Brood, MD   10 mg at 11/10/21 1309   levETIRAcetam (KEPPRA) tablet 250 mg  250 mg Oral BID Kipp Brood, MD   250 mg at 11/13/21 0940   MEDLINE mouth rinse  15 mL Mouth Rinse BID Collier Bullock, MD   15 mL at 11/13/21 0941   melatonin tablet 3 mg  3 mg Oral QHS PRN Anders Simmonds, MD   3 mg at 11/12/21 2343   metoprolol succinate (TOPROL-XL) 24 hr tablet 25 mg  25 mg Oral Daily Agarwala, Einar Grad, MD   25 mg at 11/13/21 0940   ondansetron (ZOFRAN) injection 4 mg  4 mg Intravenous Q6H PRN Collier Bullock, MD       polyethylene glycol (MIRALAX / GLYCOLAX) packet 17 g  17 g Oral Daily PRN Collier Bullock, MD         Discharge Medications: Please see discharge summary for a list of discharge medications.  Relevant Imaging Results:  Relevant Lab Results:   Additional Information SS#: [id number]  Geralynn Ochs, LCSW

## 2021-11-14 LAB — BASIC METABOLIC PANEL
Anion gap: 12 (ref 5–15)
BUN: 20 mg/dL (ref 8–23)
CO2: 24 mmol/L (ref 22–32)
Calcium: 9.1 mg/dL (ref 8.9–10.3)
Chloride: 101 mmol/L (ref 98–111)
Creatinine, Ser: 0.96 mg/dL (ref 0.44–1.00)
GFR, Estimated: 56 mL/min — ABNORMAL LOW (ref >60)
Glucose, Bld: 108 mg/dL — ABNORMAL HIGH (ref 70–99)
Potassium: 4.6 mmol/L (ref 3.5–5.1)
Sodium: 137 mmol/L (ref 135–145)

## 2021-11-14 LAB — CBC
HCT: 40.2 % (ref 36.0–46.0)
Hemoglobin: 13.1 g/dL (ref 12.0–15.0)
MCH: 28.3 pg (ref 26.0–34.0)
MCHC: 32.6 g/dL (ref 30.0–36.0)
MCV: 86.8 fL (ref 80.0–100.0)
Platelets: 349 10*3/uL (ref 150–400)
RBC: 4.63 MIL/uL (ref 3.87–5.11)
RDW: 15.6 % — ABNORMAL HIGH (ref 11.5–15.5)
WBC: 11.1 10*3/uL — ABNORMAL HIGH (ref 4.0–10.5)
nRBC: 0 % (ref 0.0–0.2)

## 2021-11-14 LAB — RESP PANEL BY RT-PCR (FLU A&B, COVID) ARPGX2
Influenza A by PCR: NEGATIVE
Influenza B by PCR: NEGATIVE
SARS Coronavirus 2 by RT PCR: NEGATIVE

## 2021-11-14 MED ORDER — ACETAMINOPHEN 325 MG PO TABS
650.0000 mg | ORAL_TABLET | Freq: Four times a day (QID) | ORAL | Status: DC | PRN
  Administered 2021-11-14: 650 mg via ORAL
  Filled 2021-11-14 (×2): qty 2

## 2021-11-14 MED ORDER — LEVETIRACETAM 250 MG PO TABS: 250.0000 mg | ORAL_TABLET | Freq: Two times a day (BID) | ORAL | Status: DC

## 2021-11-14 MED ORDER — MELATONIN 3 MG PO TABS: 3.0000 mg | ORAL_TABLET | Freq: Every evening | ORAL | 0 refills | Status: DC | PRN

## 2021-11-14 MED ORDER — POLYETHYLENE GLYCOL 3350 17 G PO PACK: 17.0000 g | PACK | Freq: Every day | ORAL | 0 refills | Status: AC | PRN

## 2021-11-14 MED ORDER — ATORVASTATIN CALCIUM 20 MG PO TABS: 20.0000 mg | ORAL_TABLET | Freq: Every day | ORAL | Status: DC

## 2021-11-14 NOTE — Progress Notes (Signed)
Physical Therapy Treatment Patient Details Name: Kristy [name] MRN: [id number] DOB: 1928/05/29 Today's Date: 11/14/2021   History of Present Illness Pt is 87 yo female who presents with fall from standing on 12/3 and found to have R temporoparietal ICH with IVH. Pt also with R radial styloid fx. Pt began having some confusion 12/5 and started on Keppra for potential seizures. PMH: Afib on Xarelto, GERD, HTN, hiatal hernia.    PT Comments    Pt received in supine, agreeable to therapy session after eating dinner and with good participation and tolerance for transfer training and gait progression in room. Pt able to perform seated balance/hygiene task with up to minA for balance with weight shifting/while unsupported by UE and needing up to [facility] for safety with short gait trial with HHA. Pt with poor compliance with RUE NWB precautions and needs tactile assist to maintain during gait and bed mobility. Pt continues to benefit from PT services to progress toward functional mobility goals. Continue to recommend SNF, pt remains at a high risk of falls due to poor standing balance, NWB RUE status, quick to fatigue and need for moderate physical assist with standing tasks.   Recommendations for follow up therapy are one component of a multi-disciplinary discharge planning process, led by the attending physician.  Recommendations may be updated based on patient status, additional functional criteria and insurance authorization.  Follow Up Recommendations  Skilled nursing-short term rehab (<3 hours/day)     Assistance Recommended at Discharge Frequent or constant Supervision/Assistance  Equipment Recommendations  Other (comment) (defer to post-acute setting)    Recommendations for Other Services       Precautions / Restrictions Precautions Precautions: Fall Required Braces or Orthoses: Splint/Cast Splint/Cast: R UE Restrictions Weight Bearing Restrictions: Yes RUE Weight Bearing: Non weight  bearing     Mobility  Bed Mobility Overal bed mobility: Needs Assistance Bed Mobility: Supine to Sit;Sit to Supine     Supine to sit: Min assist;HOB elevated Sit to supine: Min assist;+2 for physical assistance   General bed mobility comments: Pt able to come to sitting with trunk assist to prevent weightbearing on RUE and mod cues; needs +2 assist for return to supine due to poor technique/compliance with RUE precautions and needing trunk/LE support    Transfers Overall transfer level: Needs assistance Equipment used: 1 person hand held assist Transfers: Sit to/from Stand Sit to Stand: Min assist;Mod assist           General transfer comment: Increased time to stand and steady herself with HHA +1; needs modA to boost from low toilet surface using wall railing    Ambulation/Gait Ambulation/Gait assistance: Mod assist Gait Distance (Feet): 20 Feet (x2 (to/from bathroom)) Assistive device: 1 person hand held assist Gait Pattern/deviations: Step-to pattern;Narrow base of support;Shuffle;Drifts right/left;Trunk flexed;Decreased stride length       General Gait Details: flexed posture throughout and groping with R UE to hold onto sink, etc needed max cues and some tactile reminders not to use RUE. may benefit from sling at SNF to prevent WB during mobility   Stairs             Wheelchair Mobility    Modified Rankin (Stroke Patients Only) Modified Rankin (Stroke Patients Only) Pre-Morbid Rankin Score: No symptoms Modified Rankin: Moderately severe disability     Balance Overall balance assessment: Needs assistance Sitting-balance support: Feet supported Sitting balance-Leahy Scale: Fair     Standing balance support: Bilateral upper extremity supported Standing balance-Leahy Scale: Poor Standing  balance comment: UE support and external support for balance                            Cognition Arousal/Alertness: Awake/alert Behavior During  Therapy: Impulsive Overall Cognitive Status: Impaired/Different from baseline Area of Impairment: Memory;Safety/judgement;Problem solving                     Memory: Decreased short-term memory Following Commands: Follows one step commands with increased time Safety/Judgement: Decreased awareness of safety;Decreased awareness of deficits   Problem Solving: Slow processing;Decreased initiation;Requires verbal cues;Requires tactile cues General Comments: Pt impulsive, requiring increased assist for safety, ignores cues for not using RUE needing tactile and verbal cues not to reach out for furniture when walking in the room.        Exercises      General Comments General comments (skin integrity, edema, etc.): VSS on RA      Pertinent Vitals/Pain Pain Assessment: No/denies pain Faces Pain Scale: No hurt Pain Intervention(s): Monitored during session;Repositioned           PT Goals (current goals can now be found in the care plan section) Acute Rehab PT Goals Patient Stated Goal: get up to bathroom PT Goal Formulation: With patient/family Time For Goal Achievement: 11/25/21 Progress towards PT goals: Progressing toward goals    Frequency    Min 3X/week      PT Plan Current plan remains appropriate       AM-PAC PT "6 Clicks" Mobility   Outcome Measure  Help needed turning from your back to your side while in a flat bed without using bedrails?: A Little Help needed moving from lying on your back to sitting on the side of a flat bed without using bedrails?: A Little Help needed moving to and from a bed to a chair (including a wheelchair)?: A Lot Help needed standing up from a chair using your arms (e.g., wheelchair or bedside chair)?: A Lot Help needed to walk in hospital room?: A Lot Help needed climbing 3-5 steps with a railing? : Total 6 Click Score: 13    End of Session Equipment Utilized During Treatment: Gait belt (son given gait belt she can use  once home) Activity Tolerance: Patient tolerated treatment well Patient left: in bed;with call bell/phone within reach;with bed alarm set;with family/visitor present (HOB elevated >40 degrees as she had been drinking water/eating recently, son present in room to assist her) Nurse Communication: Mobility status PT Visit Diagnosis: Pain;Difficulty in walking, not elsewhere classified (R26.2);History of falling (Z91.81);Muscle weakness (generalized) (M62.81) Pain - Right/Left: Right Pain - part of body: Arm     Time: 1745-1816 PT Time Calculation (min) (ACUTE ONLY): 31 min  Charges:  $Gait Training: 8-22 mins $Therapeutic Activity: 8-22 mins                     Riniyah Speich P., PTA Acute Rehabilitation Services Pager: [phone] Office: [place] 11/14/2021, 6:50 PM

## 2021-11-14 NOTE — Progress Notes (Signed)
Notified [facility] that patients son wants her discharged prior to 9pm. Patient is 4th on the list and they will try to get her at a reasonable time.  Left message for case manager regarding situation.  Son is wanting to cancel discharge if it is too late.

## 2021-11-14 NOTE — Evaluation (Signed)
Clinical/Bedside Swallow Evaluation Patient Details  Name: Kristy [name] MRN: [id number] Date of Birth: 03-Jan-1928  Today's Date: 11/14/2021 Time: SLP Start Time (ACUTE ONLY): 1140 SLP Stop Time (ACUTE ONLY): 8676 SLP Time Calculation (min) (ACUTE ONLY): 18 min  Past Medical History:  Past Medical History:  Diagnosis Date   A-fib [facility])    Cataract    Closed left arm fracture 2016   Colon polyps    Diverticulosis    Endometrial hyperplasia without atypia, simple    Endometrial polyp    Fatty liver    GERD (gastroesophageal reflux disease)    Hiatal hernia    Hyperplastic colon polyp    Hypertension    Lipoma    Obesity    Optic neuropathy of both eyes 1989, 1997   SOB (shortness of breath)    Thrombocytosis    Past Surgical History:  Past Surgical History:  Procedure Laterality Date   breast biopsy     CATARACT EXTRACTION     CHOLECYSTECTOMY  03/2002   DILATION AND CURETTAGE OF UTERUS     x 2   EYE SURGERY     HERNIA REPAIR     KNEE SURGERY     optic neuritis     umbilical hernia repair  09/2002   HPI:  [name] is a 87 y.o. female with history of atrial fibrillation on Xarelto, HTN, thrombocytosis and GERD presenting with a right frontal and parietal ICH with IVH after an unwitnessed fall in her yard.  The fall was unwitnessed, and patient does not have any recollection of why she fell.  She states that she may have blacked out.  She was taken to the ED the day after her fall by family, where she was found to have a large temporoparietal ICH.  Her Xarelto was reversed with Andexxa, and she was admitted to the ICU.  Of note, she also suffered a right radial styloid fracture, which will be treated non-operatively. Pt passed [place] swallow screen, but has remained lethargic and not eating well.    Assessment / Plan / Recommendation  Clinical Impression  Pt presents as lethargic, difficult to fully awaken and keep awake for meals. She passed a Youth worker, but has been  noticed to cough during meals with son. SLP offered sips of water with two trials WNL and two with immediate cough response. Pt with assited feeding at this time with eyes closed. She was able to masticate fruit and consume nectar thickened liquids well. Her d/c is currently pending so recommend continuing a mechanical soft diet but briefly thickening liquids to nectar with SLP to f/u at SNF level. Anticipate that if her arousal improves she will be able to demonstrate consistent tolerance of thin liquids. If her coughing with liquids persist, she may need to return for an OP MBS, but hopefully this will not be needed. SLP Visit Diagnosis: Dysphagia, oropharyngeal phase (R13.12)    Aspiration Risk  Moderate aspiration risk    Diet Recommendation Dysphagia 3 (Mech soft);Nectar-thick liquid   Liquid Administration via: Cup;Straw Medication Administration: Whole meds with puree Supervision: Staff to assist with self feeding;Full supervision/cueing for compensatory strategies Compensations: Slow rate;Small sips/bites Postural Changes: Seated upright at 90 degrees    Other  Recommendations Oral Care Recommendations: Oral care BID    Recommendations for follow up therapy are one component of a multi-disciplinary discharge planning process, led by the attending physician.  Recommendations may be updated based on patient status, additional functional  criteria and insurance authorization.  Follow up Recommendations Skilled nursing-short term rehab (<3 hours/day)      Assistance Recommended at Discharge    Functional Status Assessment Patient has had a recent decline in their functional status and demonstrates the ability to make significant improvements in function in a reasonable and predictable amount of time.  Frequency and Duration min 2x/week  2 weeks       Prognosis        Swallow Study   General HPI: [name] is a 87 y.o. female with history of atrial fibrillation on Xarelto,  HTN, thrombocytosis and GERD presenting with a right frontal and parietal ICH with IVH after an unwitnessed fall in her yard.  The fall was unwitnessed, and patient does not have any recollection of why she fell.  She states that she may have blacked out.  She was taken to the ED the day after her fall by family, where she was found to have a large temporoparietal ICH.  Her Xarelto was reversed with Andexxa, and she was admitted to the ICU.  Of note, she also suffered a right radial styloid fracture, which will be treated non-operatively. Pt passed [facility] swallow screen, but has remained lethargic and not eating well. Type of Study: Bedside Swallow Evaluation Previous Swallow Assessment: none Diet Prior to this Study: Dysphagia 3 (soft);Thin liquids Temperature Spikes Noted: No Respiratory Status: Room air History of Recent Intubation: No Behavior/Cognition: Lethargic/Drowsy Oral Cavity Assessment: Within Functional Limits Oral Care Completed by SLP: No Oral Cavity - Dentition: Adequate natural dentition Vision: Impaired for self-feeding Self-Feeding Abilities: Total assist Patient Positioning: Upright in bed Baseline Vocal Quality: Low vocal intensity Volitional Cough: Weak Volitional Swallow: Able to elicit    Oral/Motor/Sensory Function Overall Oral Motor/Sensory Function: Within functional limits   Ice Chips     Thin Liquid Thin Liquid: Impaired Presentation: Cup;Straw Pharyngeal  Phase Impairments: Cough - Immediate    Nectar Thick Nectar Thick Liquid: Within functional limits Presentation: Straw   Honey Thick Honey Thick Liquid: Not tested   Puree Puree: Within functional limits   Solid     Solid: Within functional limits      Kristy [name], Kristy [name] 11/14/2021,12:47 PM

## 2021-11-14 NOTE — Progress Notes (Signed)
Report called to Rhunette Croft, RN at [facility].

## 2021-11-14 NOTE — Discharge Summary (Signed)
[facility]  Physician Discharge Summary   Patient ID: Kristy [name] MRN: [id number] DOB/AGE: [birth year] 87 y.o.  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 Mardelle Matte with orthopedics in 1 week after discharge for her right radial styloid fracture Ambulatory referral sent to neurology for follow-up Consideration to be given to reinitiating anticoagulation after 1 month and discussions with neurology    Home Health: Going to SNF Equipment/Devices: None  CODE STATUS: Full code  DISCHARGE CONDITION: fair  Diet recommendation: Dysphagia 3 diet with nectar thick liquids  INITIAL HISTORY: 87 y/o woman with hx of afib on xarelto, GERD, hiatal hernia and HTN, here with hemorrhagic stroke, fall from standing on 12/3.  History is not entirely clear, but she had a unwittnessed vs witnessed by neighbors fall from standing (?) yesterday.   Possible loss of consciousness.  Found by family today in her bed with bruises and weakness. Brought by daughter to ED (had to be escorted out of ER).  CT Head consistent with large basal ganglia hemorrhage.  Treated with KCentra for reversal. Also found to be hypertensive requiring Cleviprex for BP management. Additional R radial styloid fracture.      Consultants: Neurology   HOSPITAL COURSE:   Hemorrhagic stroke Noted to have a right frontal intraparenchymal hemorrhage.  Patient was seen by neurosurgery and not thought to be a candidate for surgical intervention. Patient was admitted to neuro ICU.  Seen by neurology.  She was on Xarelto and her anticoagulation was reversed. No anticoagulation or antithrombotic medications at this time. Required Cleviprex  in the ICU. MRI showed stability of the intracranial hemorrhage. LDL was noted to be 90. There was also concern for seizure activity based on EEG.  Patient is currently on [place]. Noted to have coughing episodes while eating breakfast this morning.  To be seen by speech therapy prior to discharge.  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  Currently on Keppra.  Essential hypertension Continue hydrochlorothiazide and metoprolol.  Blood pressure reasonably well controlled.    History of paroxysmal atrial fibrillation Was on Xarelto previously.  Now with intracranial hemorrhage anticoagulation is on hold.  Heart rate is well controlled.  Continue beta-blocker. Consideration to be given to reinitiating anticoagulation after a month and discussions with neurology.  Hyperlipidemia LDL was noted to be elevated at 90.  Per neurology recommendation is to initiate statin at discharge.  Started on atorvastatin 20 mg daily.   Right-sided radial styloid fracture. Seen by Dr. Mardelle Matte with orthopedics.  Plan is for outpatient follow-up in 1 week.  Pain seems to be reasonably well controlled.   Hyponatremia/hypokalemia Sodium level is stable.  Potassium improved.  Magnesium was 2.1.   Patient is stable.  Okay for discharge to SNF once seen by SLP.  Long discussion with patient's son at bedside.   PERTINENT LABS:  The results of significant diagnostics from this hospitalization (including imaging, microbiology, ancillary and laboratory) are listed below for reference.    Microbiology: Recent Results (from the past 240 hour(s))  Resp Panel by RT-PCR (Flu A&B, Covid) Nasopharyngeal Swab     Status: None   Collection Time: 11/09/21  3:41 PM   Specimen: Nasopharyngeal Swab; Nasopharyngeal(NP) swabs in vial transport medium  Result Value Ref Range Status   SARS Coronavirus  2 by RT PCR NEGATIVE NEGATIVE Final    Comment: (NOTE) SARS-CoV-2 target nucleic acids are NOT DETECTED.  The SARS-CoV-2  RNA is generally detectable in upper respiratory specimens during the acute phase of infection. The lowest concentration of SARS-CoV-2 viral copies this assay can detect is 138 copies/mL. A negative result does not preclude SARS-Cov-2 infection and should not be used as the sole basis for treatment or other patient management decisions. A negative result may occur with  improper specimen collection/handling, submission of specimen other than nasopharyngeal swab, presence of viral mutation(s) within the areas targeted by this assay, and inadequate number of viral copies(<138 copies/mL). A negative result must be combined with clinical observations, patient history, and epidemiological information. The expected result is Negative.  Fact Sheet for Patients:  EntrepreneurPulse.com.au  Fact Sheet for Healthcare Providers:  IncredibleEmployment.be  This test is no t yet approved or cleared by the [place] FDA and  has been authorized for detection and/or diagnosis of SARS-CoV-2 by FDA under an Emergency Use Authorization (EUA). This EUA will remain  in effect (meaning this test can be used) for the duration of the COVID-19 declaration under Section 564(b)(1) of the Act, 21 U.S.C.section 360bbb-3(b)(1), unless the authorization is terminated  or revoked sooner.       Influenza A by PCR NEGATIVE NEGATIVE Final   Influenza B by PCR NEGATIVE NEGATIVE Final    Comment: (NOTE) The Xpert Xpress SARS-CoV-2/FLU/RSV plus assay is intended as an aid in the diagnosis of influenza from Nasopharyngeal swab specimens and should not be used as a sole basis for treatment. Nasal washings and aspirates are unacceptable for Xpert Xpress SARS-CoV-2/FLU/RSV testing.  Fact Sheet for Patients: EntrepreneurPulse.com.au  Fact Sheet for Healthcare Providers: IncredibleEmployment.be  This test is not yet approved or cleared by the  [place] FDA and has been authorized for detection and/or diagnosis of SARS-CoV-2 by FDA under an Emergency Use Authorization (EUA). This EUA will remain in effect (meaning this test can be used) for the duration of the COVID-19 declaration under Section 564(b)(1) of the Act, 21 U.S.C. section 360bbb-3(b)(1), unless the authorization is terminated or revoked.  Performed at [facility], [place] [address], [place], [place] [address]   MRSA Next Gen by PCR, Nasal     Status: None   Collection Time: 11/10/21 12:10 AM   Specimen: Nasal Mucosa; Nasal Swab  Result Value Ref Range Status   MRSA by PCR Next Gen NOT DETECTED NOT DETECTED Final    Comment: (NOTE) The GeneXpert MRSA Assay (FDA approved for NASAL specimens only), is one component of a comprehensive MRSA colonization surveillance program. It is not intended to diagnose MRSA infection nor to guide or monitor treatment for MRSA infections. Test performance is not FDA approved in patients less than 45 years old. Performed at [facility], [place] [address], [place], [place] [address]      Labs:  COVID-19 Labs   Lab Results  Component Value Date   [place] NEGATIVE 11/09/2021      Basic Metabolic Panel: Recent Labs  Lab 11/09/21 1410 11/11/21 0317 11/12/21 0352 11/13/21 0400 11/14/21 0336  NA 141 136 133* 132* 137  K 4.6 3.4* 3.3* 4.1 4.6  CL 105 99 99 101 101  CO2 26 25 24 22 24   GLUCOSE 166* 106* 114* 111* 108*  BUN 16 26* 28* 21 20  CREATININE 1.02* 1.21* 1.21* 0.98 0.96  CALCIUM 10.0 9.2 8.7* 9.1 9.1  MG  --  1.7  --  2.1  --     CBC: Recent Labs  Lab 11/09/21 1410 11/11/21 0317 11/12/21 0352 11/13/21 0400 11/14/21 0336  WBC 9.6 12.0* 10.0 10.9* 11.1*  NEUTROABS 8.1*  --   --  7.6  --   HGB 14.0 12.3 12.0 13.1 13.1  HCT 43.4 37.3 34.9* 38.6 40.2  MCV 87.7 85.7 84.3 85.2 86.8  PLT 466* 445* 409* 419* 349     IMAGING STUDIES CT ANGIO HEAD NECK W WO CM  Result Date:  11/09/2021 CLINICAL DATA:  Intracranial hemorrhage EXAM: CT ANGIOGRAPHY HEAD AND NECK TECHNIQUE: Multidetector CT imaging of the head and neck was performed using the standard protocol during bolus administration of intravenous contrast. Multiplanar CT image reconstructions and MIPs were obtained to evaluate the vascular anatomy. Carotid stenosis measurements (when applicable) are obtained utilizing NASCET criteria, using the distal internal carotid diameter as the denominator. CONTRAST:  30mL OMNIPAQUE IOHEXOL 350 MG/ML SOLN COMPARISON:  11/09/2021 at 2:26 p.m. FINDINGS: CT HEAD FINDINGS Brain: Unchanged appearance of large intraparenchymal hemorrhage centered in the right basal ganglia. Slightly increased amount of blood layering in the occipital horns. Mild leftward midline shift is unchanged. Small anterior cranial fossa meningioma. Skull: The visualized skull base, calvarium and extracranial soft tissues are normal. Sinuses/Orbits: No fluid levels or advanced mucosal thickening of the visualized paranasal sinuses. No mastoid or middle ear effusion. The orbits are normal. CTA NECK FINDINGS SKELETON: There is no bony spinal canal stenosis. No lytic or blastic lesion. OTHER NECK: Normal pharynx, larynx and major salivary glands. No cervical lymphadenopathy. Unremarkable thyroid gland. UPPER CHEST: No pneumothorax or pleural effusion. No nodules or masses. AORTIC ARCH: There is calcific atherosclerosis of the aortic arch. There is no aneurysm, dissection or hemodynamically significant stenosis of the visualized portion of the aorta. Conventional 3 vessel aortic branching pattern. The visualized proximal subclavian arteries are widely patent. RIGHT CAROTID SYSTEM: No dissection, occlusion or aneurysm. Mild atherosclerotic calcification at the carotid bifurcation without hemodynamically significant stenosis. LEFT CAROTID SYSTEM: No dissection, occlusion or aneurysm. Mild atherosclerotic calcification at the carotid  bifurcation without hemodynamically significant stenosis. VERTEBRAL ARTERIES: Left dominant configuration. Both origins are clearly patent. There is no dissection, occlusion or flow-limiting stenosis to the skull base (V1-V3 segments). CTA HEAD FINDINGS POSTERIOR CIRCULATION: --Vertebral arteries: Normal V4 segments. --Inferior cerebellar arteries: Normal. --Basilar artery: Normal. --Superior cerebellar arteries: Normal. --Posterior cerebral arteries (PCA): Normal. ANTERIOR CIRCULATION: --Intracranial internal carotid arteries: Normal. --Anterior cerebral arteries (ACA): Normal. Both A1 segments are present. Patent anterior communicating artery (a-comm). --Middle cerebral arteries (MCA): Normal. VENOUS SINUSES: As permitted by contrast timing, patent. ANATOMIC VARIANTS: None Review of the MIP images confirms the above findings. IMPRESSION: 1. Unchanged appearance of large intraparenchymal hemorrhage centered in the right basal ganglia with unchanged mild leftward midline shift. 2. Slightly increased amount of blood layering in the occipital horns. 3. No intracranial arterial occlusion or high-grade stenosis. 4. Mild bilateral carotid bifurcation atherosclerosis without hemodynamically significant stenosis by NASCET criteria. Aortic atherosclerosis (ICD10-I70.0). Electronically Signed   By: Ulyses Jarred M.D.   On: 11/09/2021 21:57   CT Head Wo Contrast  Addendum Date: 11/09/2021   ADDENDUM REPORT: 11/09/2021 14:49 ADDENDUM: Not included in the initial report, there is small volume sulcal subarachnoid hemorrhage adjacent to the parenchymal hemorrhage. Additionally, there is a 1.3 cm partially calcified meningioma of the anterior cranial fossa along the cribriform plate. Electronically Signed   By: Macy Mis M.D.   On: 11/09/2021 14:49   Result  Date: 11/09/2021 CLINICAL DATA:  Facial trauma EXAM: CT HEAD WITHOUT CONTRAST TECHNIQUE: Contiguous axial images were obtained from the base of the skull through the  vertex without intravenous contrast. COMPARISON:  None. FINDINGS: Brain: Large area of parenchymal hemorrhage is present in the right frontal lobe including the opercular region as well as the insula and subinsular white matter. This measures approximately 5.5 x 3.8 x 4.6 cm. Surrounding edema is present. There is mass effect including effacement of the right lateral ventricle and mild 4 mm leftward midline shift. No definite hydrocephalus. Prominence of ventricles and sulci reflects parenchymal volume loss. Gray-white differentiation is preserved. Patchy hypoattenuation in the supratentorial white matter is nonspecific but probably reflects chronic microvascular ischemic changes. Vascular: There is atherosclerotic calcification at the skull base. Skull: Calvarium is unremarkable. Sinuses/Orbits: No acute finding. Other: None. IMPRESSION: Right frontoinsular parenchymal hemorrhage with surrounding edema and regional mass effect including mild leftward midline shift. No definite hydrocephalus. These results were called by telephone at the time of interpretation on 11/09/2021 at 2:39 pm to provider [facility] , who verbally acknowledged these results. Electronically Signed: By: Macy Mis M.D. On: 11/09/2021 14:41   CT Cervical Spine Wo Contrast  Result Date: 11/09/2021 CLINICAL DATA:  Neck trauma, intoxicated or obtunded (Age >= 16y) EXAM: CT CERVICAL SPINE WITHOUT CONTRAST TECHNIQUE: Multidetector CT imaging of the cervical spine was performed without intravenous contrast. Multiplanar CT image reconstructions were also generated. COMPARISON:  None. FINDINGS: Alignment: Mild anterolisthesis at C7-T1. Skull base and vertebrae: Vertebral body heights are maintained. No acute fracture. Soft tissues and spinal canal: No prevertebral fluid or swelling. No visible canal hematoma. Disc levels: Multilevel degenerative changes are present including disc space narrowing, endplate osteophytes, and facet and  uncovertebral hypertrophy. No high-grade osseous encroachment on the spinal canal. Multilevel foraminal stenosis. Upper chest: Minimally included lung apices are clear. Other: Mild calcified plaque along the proximal internal carotids. IMPRESSION: No acute cervical spine fracture. Electronically Signed   By: Macy Mis M.D.   On: 11/09/2021 14:46   MR BRAIN WO CONTRAST  Result Date: 11/10/2021 CLINICAL DATA:  Stroke, follow-up. Confusion. Facial trauma. Subarachnoid hemorrhage. Intracranial hemorrhage. EXAM: MRI HEAD WITHOUT CONTRAST TECHNIQUE: Multiplanar, multiecho pulse sequences of the brain and surrounding structures were obtained without intravenous contrast. COMPARISON:  CT head without contrast and CTA head and neck 11/09/2021 FINDINGS: Brain: Study is moderately degraded by patient. Right frontal parenchymal hemorrhage is similar in size prior exam, measuring 6.1 x 3.5 x 3.9 cm. Small fluid level is noted anteriorly within the collection. Mass effect effaces the right lateral ventricle. 3 mm midline shift noted. No new hemorrhage is present. Surrounding vasogenic edema present. Minimal subarachnoid or subdural blood along the falx is again seen. The meningioma along the plane of sphenoidale is not well seen due to patient motion. Periventricular white matter changes are otherwise mild. Small amount of blood is now evident within the posterior horns of the lateral ventricles bilaterally. No obstruction is present. Brainstem and cerebellum are unremarkable. Vascular: Flow is present in the major intracranial arteries. Skull and upper cervical spine: The craniocervical junction is normal. Upper cervical spine is within normal limits. Marrow signal is unremarkable. Sinuses/Orbits: The paranasal sinuses and mastoid air cells are clear. Bilateral lens replacements are noted. Globes and orbits are otherwise unremarkable. IMPRESSION: 1. Similar appearance of right frontal parenchymal hemorrhage measuring  6.1 x 3.5 x 3.9 cm. 2. Study is moderately degraded by patient motion, limiting detail. 3. Surrounding vasogenic edema and  mass effect with 3 mm midline shift. 4. Small amount of intraventricular blood now evident within the posterior horns of the lateral ventricles bilaterally. No obstruction is present. 5. Minimal subarachnoid or subdural blood along the falx. 6. The meningioma along the plane of sphenoidale is not well seen due to patient motion. Electronically Signed   By: San Morelle M.D.   On: 11/10/2021 11:46   CT CHEST ABDOMEN PELVIS W CONTRAST  Result Date: 11/09/2021 CLINICAL DATA:  Abdominal trauma. EXAM: CT CHEST, ABDOMEN, AND PELVIS WITH CONTRAST TECHNIQUE: Multidetector CT imaging of the chest, abdomen and pelvis was performed following the standard protocol during bolus administration of intravenous contrast. CONTRAST:  141mL OMNIPAQUE IOHEXOL 300 MG/ML  SOLN COMPARISON:  January 28, 2011 FINDINGS: CT CHEST FINDINGS Cardiovascular: Enlarged heart. Calcific atherosclerotic disease of the aorta. No pericardial effusion. Mediastinum/Nodes: Large hiatal hernia contains the stomach, small and large bowel loops. No evidence of incarceration. No evidence of mediastinal lymphadenopathy. Mild distension of the esophagus. Lungs/Pleura: Bibasilar atelectasis. Musculoskeletal: No evidence of fracture. CT ABDOMEN PELVIS FINDINGS Hepatobiliary: No focal liver abnormality is seen. Status post cholecystectomy. No biliary dilatation. Pancreas: Unremarkable. No pancreatic ductal dilatation or surrounding inflammatory changes. Spleen: Normal in size without focal abnormality. Adrenals/Urinary Tract: Normal adrenal glands. Normal right kidney. 8 mm possibly dystrophic calculus within the lower pole of the left kidney, nonobstructive. Normal ureters and urinary bladder. Stomach/Bowel: No evidence of small-bowel obstruction. Diffuse colonic diverticulosis, particularly in the sigmoid colon. No evidence of  diverticulitis. Vascular/Lymphatic: Aortic atherosclerosis. No enlarged abdominal or pelvic lymph nodes. Reproductive: Uterus and bilateral adnexa are unremarkable. Other: No abdominal wall hernia or abnormality. No abdominopelvic ascites. Musculoskeletal: No fracture is seen. Scoliosis and spondylosis of the spine. IMPRESSION: 1. No evidence of acute traumatic injury to the chest, abdomen or pelvis. 2. Large hiatal hernia contains the stomach, small and large bowel loops. No evidence of incarceration. 3. Diffuse colonic diverticulosis without evidence of diverticulitis. 4. 8 mm possibly dystrophic calculus within the lower pole of the left kidney, nonobstructive. 5. Enlarged heart. 6. Calcific atherosclerotic disease of the aorta. Aortic Atherosclerosis (ICD10-I70.0). Electronically Signed   By: Fidela Salisbury M.D.   On: 11/09/2021 18:48   DG CHEST PORT 1 VIEW  Result Date: 11/09/2021 CLINICAL DATA:  Cerebral bleeding. EXAM: PORTABLE CHEST 1 VIEW COMPARISON:  June 14, 2020 FINDINGS: Enlarged cardiac silhouette. Calcific atherosclerotic disease and tortuosity of the aorta. Large hiatal hernia. Bibasilar atelectasis. Osseous structures are without acute abnormality. Soft tissues are grossly normal. IMPRESSION: No active disease. Electronically Signed   By: Fidela Salisbury M.D.   On: 11/09/2021 17:57   DG Hand Complete Right  Result Date: 11/09/2021 CLINICAL DATA:  Hand pain after fall. EXAM: RIGHT HAND - COMPLETE 3+ VIEW COMPARISON:  None. FINDINGS: Minimal minimally displaced fracture of the radial styloid process with extension to the radiocarpal joint. There is some osseous irregularity of the distal aspect of the scaphoid. Diffuse demineralization of bone. Chondrocalcinosis with degenerative change of the first [facility] joint, as well as the distal interphalangeal joints with some involvement of the MCPs. Soft tissue swelling in the wrist and proximal hand. IMPRESSION: 1. Minimal minimally displaced  fracture of the radial styloid process with extension to the radiocarpal joint. 2. Osseous irregularity of the distal aspect of the scaphoid, which is favored to reflect degenerative change however nondisplaced fracture not entirely excluded. In the setting of anatomic snuffbox tenderness consider additional radiographs with dedicated scaphoid view. 3. Chondrocalcinosis with multifocal degenerative change likely  reflecting degenerative osteoarthropathy with possible superimposed CPPD arthropathy. Electronically Signed   By: Dahlia Bailiff M.D.   On: 11/09/2021 16:08   EEG adult  Result Date: 11/10/2021 Lora Havens, MD     11/10/2021  9:21 PM Patient Name: DEVOTA VIRUET MRN: [id number] Epilepsy Attending: Lora Havens Referring Physician/Provider: Dr Antony Contras Date: 11/10/2021 Duration: 21.35 mins Patient history: 87yo F with right ICH. EEG to evaluate for seizure Level of alertness: Awake AEDs during EEG study: None Technical aspects: This EEG study was done with scalp electrodes positioned according to the 10-20 International system of electrode placement. Electrical activity was acquired at a sampling rate of 500Hz  and reviewed with a high frequency filter of 70Hz  and a low frequency filter of 1Hz . EEG data were recorded continuously and digitally stored. Description: No clear posterior dominant rhythm was seen. EEG showed 5-7hz  theta slowing in left left hemisphere. EEG also showed continuous 3-5hz  theta-delta slowing in right hemisphere. Lateralized periodic discharges were noted in right hemisphere, maximal right posterior quadrant at 1-1.5 Hz. These discharges at times appeared rhythmic consistent with brief ictal-interictal rhythmic discharges ( BIRDS).  Hyperventilation and photic stimulation were not performed.   ABNORMALITY - Brief ictal-interictal rhythmic discharges, right hemisphere, maximal right posterior quadrant - Lateralized periodic discharges, right hemisphere, maximal right  posterior quadrant  - Continuous slow, generalized and lateralized right hemisphere IMPRESSION: This study showed evidence of epileptogenicity arising from right hemisphere, maximal right posterior quadrant . This eeg pattern is on the ictal-interictal continuum with high potential for seizures. No definite seizures were seen throughout the recording. Additionally the study is suggestive of cortical dysfunction arising from right hemisphere likely secondary to underlying structural abnormality/ ICH. Lastly, this study is suggestive of moderate diffuse encephalopathy, nonspecific etiology. Dr Lynetta Mare, Dr Leonie Man and Dr Erlinda Hong was notified. Priyanka Barbra Sarks   Overnight EEG with video  Result Date: 11/11/2021 Lora Havens, MD     11/12/2021 10:09 AM Patient Name: GWENDY BOEDER MRN: [id number] Epilepsy Attending: Lora Havens Referring Physician/Provider: Dr Antony Contras Duration: 11/10/2021 1756 to 11/11/2021 1633  Patient history: 87yo F with right ICH. EEG to evaluate for seizure  Level of alertness: Awake, asleep  AEDs during EEG study: LEV  Technical aspects: This EEG study was done with scalp electrodes positioned according to the 10-20 International system of electrode placement. Electrical activity was acquired at a sampling rate of 500Hz  and reviewed with a high frequency filter of 70Hz  and a low frequency filter of 1Hz . EEG data were recorded continuously and digitally stored.  Description: No clear posterior dominant rhythm was seen. Sleep was characterized by sleep spindles (12 to 14 Hz), maximum frontocentral region. EEG showed 5 to 9 Hz theta-alpha activity in left hemisphere. EEG also showed continuous 3-5hz  theta-delta slowing in right hemisphere.  Intermittently, lateralized periodic discharges were noted in right hemisphere, maximal right posterior quadrant at 1-1.5 Hz.  Hyperventilation and photic stimulation were not performed.    ABNORMALITY - Lateralized periodic discharges, right hemisphere,  maximal right posterior quadrant  - Continuous slow, generalized and lateralized right hemisphere  IMPRESSION: This study showed evidence of epileptogenicity arising from right hemisphere, maximal right posterior quadrant. This eeg pattern is on the ictal-interictal continuum with intermediate to high potential for seizures. No definite seizures were seen throughout the recording.  Additionally the study is suggestive of cortical dysfunction arising from right hemisphere likely secondary to underlying structural abnormality/ ICH.  Lastly, this study is suggestive of moderate diffuse  encephalopathy, nonspecific etiology.  Lora Havens   CT Maxillofacial Wo Contrast  Result Date: 11/09/2021 CLINICAL DATA:  Facial trauma EXAM: CT MAXILLOFACIAL WITHOUT CONTRAST TECHNIQUE: Multidetector CT imaging of the maxillofacial structures was performed. Multiplanar CT image reconstructions were also generated. COMPARISON:  None. FINDINGS: Osseous: No acute facial fracture. Orbits: No intraorbital hematoma.  Bilateral lens replacements. Sinuses: Patchy mucosal thickening. Soft tissues: Right frontal scalp soft tissue swelling/hematoma. Limited intracranial: Dictated separately. IMPRESSION: No acute facial fracture. Electronically Signed   By: Macy Mis M.D.   On: 11/09/2021 14:51    DISCHARGE EXAMINATION: Vitals:   11/13/21 2351 11/14/21 0019 11/14/21 0355 11/14/21 0819  BP: (!) 98/44 (!) 110/53 (!) 158/85 (!) 135/55  Pulse: 68 (!) 57 63 (!) 56  Resp: 19  20 14   Temp: 97.8 F (36.6 C)  97.6 F (36.4 C)   TempSrc: Axillary  Axillary   SpO2: 96%  97% 96%  Weight:       General appearance: Awake alert.  In no distress Resp: Clear to auscultation bilaterally.  Normal effort Cardio: S1-S2 is normal regular.  No S3-S4.  No rubs murmurs or bruit GI: Abdomen is soft.  Nontender nondistended.  Bowel sounds are present normal.  No masses organomegaly    DISPOSITION: SNF  Discharge Instructions      Ambulatory referral to Neurology   Complete by: As directed    An appointment is requested in approximately: 4 weeks for ICH   Call MD for:  difficulty breathing, headache or visual disturbances   Complete by: As directed    Call MD for:  extreme fatigue   Complete by: As directed    Call MD for:  persistant dizziness or light-headedness   Complete by: As directed    Call MD for:  persistant nausea and vomiting   Complete by: As directed    Call MD for:  severe uncontrolled pain   Complete by: As directed    Call MD for:  temperature >100.4   Complete by: As directed    Discharge instructions   Complete by: As directed    Please review instructions on the discharge summary.  You were cared for by a hospitalist during your hospital stay. If you have any questions about your discharge medications or the care you received while you were in the hospital after you are discharged, you can call the unit and asked to speak with the hospitalist on call if the hospitalist that took care of you is not available. Once you are discharged, your primary care physician will handle any further medical issues. Please note that NO REFILLS for any discharge medications will be authorized once you are discharged, as it is imperative that you return to your primary care physician (or establish a relationship with a primary care physician if you do not have one) for your aftercare needs so that they can reassess your need for medications and monitor your lab values. If you do not have a primary care physician, you can call [phone] for a physician referral.   Increase activity slowly   Complete by: As directed           Allergies as of 11/14/2021       Reactions   Codeine Nausea Only   Latanoprost Other (See Comments)   Red and burning   Tramadol Nausea Only        Medication List     STOP taking these medications    ALPRAZolam 0.25 MG tablet  Commonly known as: XANAX   Rivaroxaban 15 MG Tabs  tablet Commonly known as: XARELTO       TAKE these medications    atorvastatin 20 MG tablet Commonly known as: LIPITOR Take 1 tablet (20 mg total) by mouth daily.   dorzolamide-timolol 22.3-6.8 MG/ML ophthalmic solution Commonly known as: COSOPT Place 1 drop into both eyes 2 (two) times daily.   EYE VITAMINS PO Take 1 tablet by mouth daily.   hydrochlorothiazide 12.5 MG tablet Commonly known as: HYDRODIURIL Take 1 tablet (12.5 mg total) by mouth daily.   hydroxyurea 500 MG capsule Commonly known as: HYDREA Take one tablet by mouth on Monday and Thursday What changed:  how much to take how to take this when to take this additional instructions   levETIRAcetam 250 MG tablet Commonly known as: KEPPRA Take 1 tablet (250 mg total) by mouth 2 (two) times daily.   meclizine 12.5 MG tablet Commonly known as: ANTIVERT Take 1 tablet (12.5 mg total) by mouth as needed. Take as needed for  vertigo/ dizziness   melatonin 3 MG Tabs tablet Take 1 tablet (3 mg total) by mouth at bedtime as needed.   metoprolol succinate 25 MG 24 hr tablet Commonly known as: TOPROL-XL Take 1 tablet (25 mg total) by mouth daily.   omeprazole 20 MG capsule Commonly known as: PRILOSEC TAKE 1 CAPSULE BY MOUTH  DAILY   polyethylene glycol 17 g packet Commonly known as: MIRALAX / GLYCOLAX Take 17 g by mouth daily as needed for moderate constipation.   sertraline 50 MG tablet Commonly known as: Zoloft Take 1 tablet (50 mg total) by mouth daily.   SYSTANE OP Apply 1-2 drops to eye 4 (four) times daily as needed (dry eyes).   Vitamin D3 50 MCG (2000 UT) Tabs Take 4,000 Units by mouth daily.          Follow-up Information     Marchia Bond, MD. Schedule an appointment as soon as possible for a visit in 1 week(s).   Specialty: Orthopedic Surgery Why: 1 week after discharge Contact information: [place] [id number] [phone]                  TOTAL DISCHARGE TIME: 35 minutes  [facility] Pager on [email or web address]  11/14/2021, 11:06 AM

## 2021-11-15 DIAGNOSIS — I480 Paroxysmal atrial fibrillation: Secondary | ICD-10-CM | POA: Diagnosis not present

## 2021-11-15 DIAGNOSIS — I10 Essential (primary) hypertension: Secondary | ICD-10-CM | POA: Diagnosis not present

## 2021-11-15 DIAGNOSIS — R5381 Other malaise: Secondary | ICD-10-CM | POA: Diagnosis not present

## 2021-11-15 DIAGNOSIS — R6889 Other general symptoms and signs: Secondary | ICD-10-CM | POA: Diagnosis not present

## 2021-11-15 DIAGNOSIS — Z20822 Contact with and (suspected) exposure to covid-19: Secondary | ICD-10-CM | POA: Diagnosis not present

## 2021-11-15 DIAGNOSIS — I69321 Dysphasia following cerebral infarction: Secondary | ICD-10-CM | POA: Diagnosis not present

## 2021-11-15 DIAGNOSIS — S0003XA Contusion of scalp, initial encounter: Secondary | ICD-10-CM | POA: Diagnosis not present

## 2021-11-15 DIAGNOSIS — I4891 Unspecified atrial fibrillation: Secondary | ICD-10-CM | POA: Diagnosis not present

## 2021-11-15 DIAGNOSIS — Z7901 Long term (current) use of anticoagulants: Secondary | ICD-10-CM | POA: Diagnosis not present

## 2021-11-15 DIAGNOSIS — R1310 Dysphagia, unspecified: Secondary | ICD-10-CM | POA: Diagnosis not present

## 2021-11-15 DIAGNOSIS — Y9389 Activity, other specified: Secondary | ICD-10-CM | POA: Diagnosis not present

## 2021-11-15 DIAGNOSIS — Z743 Need for continuous supervision: Secondary | ICD-10-CM | POA: Diagnosis not present

## 2021-11-15 DIAGNOSIS — G319 Degenerative disease of nervous system, unspecified: Secondary | ICD-10-CM | POA: Diagnosis not present

## 2021-11-15 DIAGNOSIS — R58 Hemorrhage, not elsewhere classified: Secondary | ICD-10-CM | POA: Diagnosis not present

## 2021-11-15 DIAGNOSIS — M25511 Pain in right shoulder: Secondary | ICD-10-CM | POA: Diagnosis not present

## 2021-11-15 DIAGNOSIS — H269 Unspecified cataract: Secondary | ICD-10-CM | POA: Diagnosis not present

## 2021-11-15 DIAGNOSIS — J9811 Atelectasis: Secondary | ICD-10-CM | POA: Diagnosis not present

## 2021-11-15 DIAGNOSIS — G936 Cerebral edema: Secondary | ICD-10-CM | POA: Diagnosis not present

## 2021-11-15 DIAGNOSIS — K219 Gastro-esophageal reflux disease without esophagitis: Secondary | ICD-10-CM | POA: Diagnosis not present

## 2021-11-15 DIAGNOSIS — R0902 Hypoxemia: Secondary | ICD-10-CM | POA: Diagnosis not present

## 2021-11-15 DIAGNOSIS — W1811XA Fall from or off toilet without subsequent striking against object, initial encounter: Secondary | ICD-10-CM | POA: Diagnosis not present

## 2021-11-15 DIAGNOSIS — E871 Hypo-osmolality and hyponatremia: Secondary | ICD-10-CM | POA: Diagnosis not present

## 2021-11-15 DIAGNOSIS — I69398 Other sequelae of cerebral infarction: Secondary | ICD-10-CM | POA: Diagnosis not present

## 2021-11-15 DIAGNOSIS — H04129 Dry eye syndrome of unspecified lacrimal gland: Secondary | ICD-10-CM | POA: Diagnosis not present

## 2021-11-15 DIAGNOSIS — M6281 Muscle weakness (generalized): Secondary | ICD-10-CM | POA: Diagnosis not present

## 2021-11-15 DIAGNOSIS — D72829 Elevated white blood cell count, unspecified: Secondary | ICD-10-CM | POA: Diagnosis not present

## 2021-11-15 DIAGNOSIS — R4182 Altered mental status, unspecified: Secondary | ICD-10-CM | POA: Diagnosis present

## 2021-11-15 DIAGNOSIS — S3993XA Unspecified injury of pelvis, initial encounter: Secondary | ICD-10-CM | POA: Diagnosis not present

## 2021-11-15 DIAGNOSIS — R0989 Other specified symptoms and signs involving the circulatory and respiratory systems: Secondary | ICD-10-CM | POA: Diagnosis not present

## 2021-11-15 DIAGNOSIS — E559 Vitamin D deficiency, unspecified: Secondary | ICD-10-CM | POA: Diagnosis not present

## 2021-11-15 DIAGNOSIS — Z1589 Genetic susceptibility to other disease: Secondary | ICD-10-CM | POA: Diagnosis not present

## 2021-11-15 DIAGNOSIS — F32A Depression, unspecified: Secondary | ICD-10-CM | POA: Diagnosis not present

## 2021-11-15 DIAGNOSIS — D320 Benign neoplasm of cerebral meninges: Secondary | ICD-10-CM | POA: Diagnosis not present

## 2021-11-15 DIAGNOSIS — D473 Essential (hemorrhagic) thrombocythemia: Secondary | ICD-10-CM | POA: Diagnosis not present

## 2021-11-15 DIAGNOSIS — R278 Other lack of coordination: Secondary | ICD-10-CM | POA: Diagnosis not present

## 2021-11-15 DIAGNOSIS — S0990XA Unspecified injury of head, initial encounter: Secondary | ICD-10-CM | POA: Diagnosis not present

## 2021-11-15 DIAGNOSIS — G8929 Other chronic pain: Secondary | ICD-10-CM | POA: Diagnosis not present

## 2021-11-15 DIAGNOSIS — R569 Unspecified convulsions: Secondary | ICD-10-CM | POA: Diagnosis not present

## 2021-11-15 DIAGNOSIS — Z5181 Encounter for therapeutic drug level monitoring: Secondary | ICD-10-CM | POA: Diagnosis not present

## 2021-11-15 DIAGNOSIS — G4700 Insomnia, unspecified: Secondary | ICD-10-CM | POA: Diagnosis not present

## 2021-11-15 DIAGNOSIS — Y92009 Unspecified place in unspecified non-institutional (private) residence as the place of occurrence of the external cause: Secondary | ICD-10-CM | POA: Diagnosis not present

## 2021-11-15 DIAGNOSIS — Z9181 History of falling: Secondary | ICD-10-CM | POA: Diagnosis not present

## 2021-11-15 DIAGNOSIS — F0390 Unspecified dementia without behavioral disturbance: Secondary | ICD-10-CM | POA: Diagnosis not present

## 2021-11-15 DIAGNOSIS — Z7401 Bed confinement status: Secondary | ICD-10-CM | POA: Diagnosis not present

## 2021-11-15 DIAGNOSIS — M2578 Osteophyte, vertebrae: Secondary | ICD-10-CM | POA: Diagnosis not present

## 2021-11-15 DIAGNOSIS — R29818 Other symptoms and signs involving the nervous system: Secondary | ICD-10-CM | POA: Diagnosis not present

## 2021-11-15 DIAGNOSIS — I69391 Dysphagia following cerebral infarction: Secondary | ICD-10-CM | POA: Diagnosis not present

## 2021-11-15 DIAGNOSIS — D510 Vitamin B12 deficiency anemia due to intrinsic factor deficiency: Secondary | ICD-10-CM | POA: Diagnosis not present

## 2021-11-15 DIAGNOSIS — S52514D Nondisplaced fracture of right radial styloid process, subsequent encounter for closed fracture with routine healing: Secondary | ICD-10-CM | POA: Diagnosis not present

## 2021-11-15 DIAGNOSIS — E785 Hyperlipidemia, unspecified: Secondary | ICD-10-CM | POA: Diagnosis not present

## 2021-11-15 DIAGNOSIS — K5900 Constipation, unspecified: Secondary | ICD-10-CM | POA: Diagnosis not present

## 2021-11-15 DIAGNOSIS — I615 Nontraumatic intracerebral hemorrhage, intraventricular: Secondary | ICD-10-CM | POA: Diagnosis not present

## 2021-11-15 DIAGNOSIS — K449 Diaphragmatic hernia without obstruction or gangrene: Secondary | ICD-10-CM | POA: Diagnosis not present

## 2021-11-15 DIAGNOSIS — E876 Hypokalemia: Secondary | ICD-10-CM | POA: Diagnosis not present

## 2021-11-15 DIAGNOSIS — N390 Urinary tract infection, site not specified: Secondary | ICD-10-CM | POA: Diagnosis not present

## 2021-11-15 DIAGNOSIS — S52501D Unspecified fracture of the lower end of right radius, subsequent encounter for closed fracture with routine healing: Secondary | ICD-10-CM | POA: Diagnosis not present

## 2021-11-15 DIAGNOSIS — M25512 Pain in left shoulder: Secondary | ICD-10-CM | POA: Diagnosis not present

## 2021-11-15 NOTE — TOC Transition Note (Signed)
Transition of Care [facility]) - CM/SW Discharge Note   Patient Details  Name: SONTEE DESENA MRN: [id number] Date of Birth: 1928/06/25  [facility] CM/SW Contact:  Coralee Pesa, [place] Phone Number: 11/15/2021, 8:56 AM   Clinical Narrative:    Pt to be transported to [facility] via [place]. Nurse to call report to [phone].   Final next level of care: Skilled Nursing Facility Barriers to Discharge: Barriers Resolved   Patient Goals and CMS Choice Patient states their goals for this hospitalization and ongoing recovery are:: patient unable to participate in goal setting, only oriented to self CMS Medicare.gov Compare Post Acute Care list provided to:: Patient Represenative (must comment) Choice offered to / list presented to : [facility] POA / [place], Adult Children  Discharge Placement              Patient chooses bed at: [facility] Patient to be transferred to facility by: [place] Name of family member notified: Shanon Brow Patient and family notified of of transfer: 11/15/21  Discharge Plan and Services     Post Acute Care Choice: [place]                               Social Determinants of Health (SDOH) Interventions     Readmission Risk Interventions No flowsheet data found.

## 2021-11-15 NOTE — Progress Notes (Signed)
Son spoke with patient's primary care physician outpatient and they decided together that discharging patient at a late time is unsafe.  Discharge cancelled.  MD notified. PTAR notified.

## 2021-11-15 NOTE — Discharge Summary (Signed)
[facility]  Physician Discharge Summary   Patient ID: Kristy [name] MRN: [id number] DOB/AGE: [birth year] 87 y.o.  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 Mardelle Matte with orthopedics in 1 week after discharge for her right radial styloid fracture Ambulatory referral sent to neurology for follow-up Consideration to be given to reinitiating anticoagulation after 1 month and discussions with neurology    Home Health: Going to SNF Equipment/Devices: None  CODE STATUS: Full code  DISCHARGE CONDITION: fair  Diet recommendation: Dysphagia 3 diet with nectar thick liquids  INITIAL HISTORY: 87 y/o woman with hx of afib on xarelto, GERD, hiatal hernia and HTN, here with hemorrhagic stroke, fall from standing on 12/3.  History is not entirely clear, but she had a unwittnessed vs witnessed by neighbors fall from standing (?) yesterday.   Possible loss of consciousness.  Found by family today in her bed with bruises and weakness. Brought by daughter to ED (had to be escorted out of ER).  CT Head consistent with large basal ganglia hemorrhage.  Treated with KCentra for reversal. Also found to be hypertensive requiring Cleviprex for BP management. Additional R radial styloid fracture.      Consultants: Neurology   HOSPITAL COURSE:   Hemorrhagic stroke Noted to have a right frontal intraparenchymal hemorrhage.  Patient was seen by neurosurgery and not thought to be a candidate for surgical intervention. Patient was admitted to neuro ICU.  Seen by neurology.  She was on Xarelto and her anticoagulation was reversed. No anticoagulation or antithrombotic medications at this time. Required Cleviprex  in the ICU. MRI showed stability of the intracranial hemorrhage. LDL was noted to be 90. There was also concern for seizure activity based on EEG.  Patient is currently on [place]. Noted to have coughing episodes while eating breakfast this morning.  To be seen by speech therapy prior to discharge.  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  Currently on Keppra.  Essential hypertension Continue hydrochlorothiazide and metoprolol.  Blood pressure reasonably well controlled.    History of paroxysmal atrial fibrillation Was on Xarelto previously.  Now with intracranial hemorrhage anticoagulation is on hold.  Heart rate is well controlled.  Continue beta-blocker. Consideration to be given to reinitiating anticoagulation after a month and discussions with neurology.  Hyperlipidemia LDL was noted to be elevated at 90.  Per neurology recommendation is to initiate statin at discharge.  Started on atorvastatin 20 mg daily.   Right-sided radial styloid fracture. Seen by Dr. Mardelle Matte with orthopedics.  Plan is for outpatient follow-up in 1 week.  Pain seems to be reasonably well controlled.   Hyponatremia/hypokalemia Sodium level is stable.  Potassium improved.  Magnesium was 2.1.   Patient is stable.  Okay for discharge to SNF once seen by SLP.  Long discussion with patient's son at bedside.   PERTINENT LABS:  The results of significant diagnostics from this hospitalization (including imaging, microbiology, ancillary and laboratory) are listed below for reference.    Microbiology: Recent Results (from the past 240 hour(s))  Resp Panel by RT-PCR (Flu A&B, Covid) Nasopharyngeal Swab     Status: None   Collection Time: 11/09/21  3:41 PM   Specimen: Nasopharyngeal Swab; Nasopharyngeal(NP) swabs in vial transport medium  Result Value Ref Range Status   SARS Coronavirus  2 by RT PCR NEGATIVE NEGATIVE Final    Comment: (NOTE) SARS-CoV-2 target nucleic acids are NOT DETECTED.  The SARS-CoV-2  RNA is generally detectable in upper respiratory specimens during the acute phase of infection. The lowest concentration of SARS-CoV-2 viral copies this assay can detect is 138 copies/mL. A negative result does not preclude SARS-Cov-2 infection and should not be used as the sole basis for treatment or other patient management decisions. A negative result may occur with  improper specimen collection/handling, submission of specimen other than nasopharyngeal swab, presence of viral mutation(s) within the areas targeted by this assay, and inadequate number of viral copies(<138 copies/mL). A negative result must be combined with clinical observations, patient history, and epidemiological information. The expected result is Negative.  Fact Sheet for Patients:  EntrepreneurPulse.com.au  Fact Sheet for Healthcare Providers:  IncredibleEmployment.be  This test is no t yet approved or cleared by the [place] FDA and  has been authorized for detection and/or diagnosis of SARS-CoV-2 by FDA under an Emergency Use Authorization (EUA). This EUA will remain  in effect (meaning this test can be used) for the duration of the COVID-19 declaration under Section 564(b)(1) of the Act, 21 U.S.C.section 360bbb-3(b)(1), unless the authorization is terminated  or revoked sooner.       Influenza A by PCR NEGATIVE NEGATIVE Final   Influenza B by PCR NEGATIVE NEGATIVE Final    Comment: (NOTE) The Xpert Xpress SARS-CoV-2/FLU/RSV plus assay is intended as an aid in the diagnosis of influenza from Nasopharyngeal swab specimens and should not be used as a sole basis for treatment. Nasal washings and aspirates are unacceptable for Xpert Xpress SARS-CoV-2/FLU/RSV testing.  Fact Sheet for Patients: EntrepreneurPulse.com.au  Fact Sheet for Healthcare Providers: IncredibleEmployment.be  This test is not yet approved or cleared by the  [place] FDA and has been authorized for detection and/or diagnosis of SARS-CoV-2 by FDA under an Emergency Use Authorization (EUA). This EUA will remain in effect (meaning this test can be used) for the duration of the COVID-19 declaration under Section 564(b)(1) of the Act, 21 U.S.C. section 360bbb-3(b)(1), unless the authorization is terminated or revoked.  Performed at [facility], [place] [address] [place], [place] [address]   MRSA Next Gen by PCR, Nasal     Status: None   Collection Time: 11/10/21 12:10 AM   Specimen: Nasal Mucosa; Nasal Swab  Result Value Ref Range Status   MRSA by PCR Next Gen NOT DETECTED NOT DETECTED Final    Comment: (NOTE) The GeneXpert MRSA Assay (FDA approved for NASAL specimens only), is one component of a comprehensive MRSA colonization surveillance program. It is not intended to diagnose MRSA infection nor to guide or monitor treatment for MRSA infections. Test performance is not FDA approved in patients less than 41 years old. Performed at [facility], [place] [address] [place], [place] [address]   Resp Panel by RT-PCR (Flu A&B, Covid) Nasopharyngeal Swab     Status: None   Collection Time: 11/14/21 11:40 AM   Specimen: Nasopharyngeal Swab; Nasopharyngeal(NP) swabs in vial transport medium  Result Value Ref Range Status   SARS Coronavirus 2 by RT PCR NEGATIVE NEGATIVE Final    Comment: (NOTE) SARS-CoV-2 target nucleic acids are NOT DETECTED.  The SARS-CoV-2 RNA is generally detectable in upper respiratory specimens during the acute phase of infection. The lowest concentration of SARS-CoV-2 viral copies this assay can detect is 138 copies/mL. A negative result does not preclude SARS-Cov-2 infection and should  not be used as the sole basis for treatment or other patient management decisions. A negative result may occur with  improper specimen collection/handling, submission of specimen other than nasopharyngeal swab, presence of  viral mutation(s) within the areas targeted by this assay, and inadequate number of viral copies(<138 copies/mL). A negative result must be combined with clinical observations, patient history, and epidemiological information. The expected result is Negative.  Fact Sheet for Patients:  EntrepreneurPulse.com.au  Fact Sheet for Healthcare Providers:  IncredibleEmployment.be  This test is no t yet approved or cleared by the [place] FDA and  has been authorized for detection and/or diagnosis of SARS-CoV-2 by FDA under an Emergency Use Authorization (EUA). This EUA will remain  in effect (meaning this test can be used) for the duration of the COVID-19 declaration under Section 564(b)(1) of the Act, 21 U.S.C.section 360bbb-3(b)(1), unless the authorization is terminated  or revoked sooner.       Influenza A by PCR NEGATIVE NEGATIVE Final   Influenza B by PCR NEGATIVE NEGATIVE Final    Comment: (NOTE) The Xpert Xpress SARS-CoV-2/FLU/RSV plus assay is intended as an aid in the diagnosis of influenza from Nasopharyngeal swab specimens and should not be used as a sole basis for treatment. Nasal washings and aspirates are unacceptable for Xpert Xpress SARS-CoV-2/FLU/RSV testing.  Fact Sheet for Patients: EntrepreneurPulse.com.au  Fact Sheet for Healthcare Providers: IncredibleEmployment.be  This test is not yet approved or cleared by the [place] FDA and has been authorized for detection and/or diagnosis of SARS-CoV-2 by FDA under an Emergency Use Authorization (EUA). This EUA will remain in effect (meaning this test can be used) for the duration of the COVID-19 declaration under Section 564(b)(1) of the Act, 21 U.S.C. section 360bbb-3(b)(1), unless the authorization is terminated or revoked.  Performed at [facility], [place] [address], [place], [place] [address]      Labs:  COVID-19  Labs   Lab Results  Component Value Date   [place] 11/14/2021   [place] NEGATIVE 11/09/2021      Basic Metabolic Panel: Recent Labs  Lab 11/09/21 1410 11/11/21 0317 11/12/21 0352 11/13/21 0400 11/14/21 0336  NA 141 136 133* 132* 137  K 4.6 3.4* 3.3* 4.1 4.6  CL 105 99 99 101 101  CO2 26 25 24 22 24   GLUCOSE 166* 106* 114* 111* 108*  BUN 16 26* 28* 21 20  CREATININE 1.02* 1.21* 1.21* 0.98 0.96  CALCIUM 10.0 9.2 8.7* 9.1 9.1  MG  --  1.7  --  2.1  --     CBC: Recent Labs  Lab 11/09/21 1410 11/11/21 0317 11/12/21 0352 11/13/21 0400 11/14/21 0336  WBC 9.6 12.0* 10.0 10.9* 11.1*  NEUTROABS 8.1*  --   --  7.6  --   HGB 14.0 12.3 12.0 13.1 13.1  HCT 43.4 37.3 34.9* 38.6 40.2  MCV 87.7 85.7 84.3 85.2 86.8  PLT 466* 445* 409* 419* 349     IMAGING STUDIES CT ANGIO HEAD NECK W WO CM  Result Date: 11/09/2021 CLINICAL DATA:  Intracranial hemorrhage EXAM: CT ANGIOGRAPHY HEAD AND NECK TECHNIQUE: Multidetector CT imaging of the head and neck was performed using the standard protocol during bolus administration of intravenous contrast. Multiplanar CT image reconstructions and MIPs were obtained to evaluate the vascular anatomy. Carotid stenosis measurements (when applicable) are obtained utilizing NASCET criteria, using the distal internal carotid diameter as the denominator. CONTRAST:  71mL OMNIPAQUE IOHEXOL 350 MG/ML SOLN COMPARISON:  11/09/2021 at 2:26 p.m.  FINDINGS: CT HEAD FINDINGS Brain: Unchanged appearance of large intraparenchymal hemorrhage centered in the right basal ganglia. Slightly increased amount of blood layering in the occipital horns. Mild leftward midline shift is unchanged. Small anterior cranial fossa meningioma. Skull: The visualized skull base, calvarium and extracranial soft tissues are normal. Sinuses/Orbits: No fluid levels or advanced mucosal thickening of the visualized paranasal sinuses. No mastoid or middle ear effusion. The orbits are  normal. CTA NECK FINDINGS SKELETON: There is no bony spinal canal stenosis. No lytic or blastic lesion. OTHER NECK: Normal pharynx, larynx and major salivary glands. No cervical lymphadenopathy. Unremarkable thyroid gland. UPPER CHEST: No pneumothorax or pleural effusion. No nodules or masses. AORTIC ARCH: There is calcific atherosclerosis of the aortic arch. There is no aneurysm, dissection or hemodynamically significant stenosis of the visualized portion of the aorta. Conventional 3 vessel aortic branching pattern. The visualized proximal subclavian arteries are widely patent. RIGHT CAROTID SYSTEM: No dissection, occlusion or aneurysm. Mild atherosclerotic calcification at the carotid bifurcation without hemodynamically significant stenosis. LEFT CAROTID SYSTEM: No dissection, occlusion or aneurysm. Mild atherosclerotic calcification at the carotid bifurcation without hemodynamically significant stenosis. VERTEBRAL ARTERIES: Left dominant configuration. Both origins are clearly patent. There is no dissection, occlusion or flow-limiting stenosis to the skull base (V1-V3 segments). CTA HEAD FINDINGS POSTERIOR CIRCULATION: --Vertebral arteries: Normal V4 segments. --Inferior cerebellar arteries: Normal. --Basilar artery: Normal. --Superior cerebellar arteries: Normal. --Posterior cerebral arteries (PCA): Normal. ANTERIOR CIRCULATION: --Intracranial internal carotid arteries: Normal. --Anterior cerebral arteries (ACA): Normal. Both A1 segments are present. Patent anterior communicating artery (a-comm). --Middle cerebral arteries (MCA): Normal. VENOUS SINUSES: As permitted by contrast timing, patent. ANATOMIC VARIANTS: None Review of the MIP images confirms the above findings. IMPRESSION: 1. Unchanged appearance of large intraparenchymal hemorrhage centered in the right basal ganglia with unchanged mild leftward midline shift. 2. Slightly increased amount of blood layering in the occipital horns. 3. No intracranial  arterial occlusion or high-grade stenosis. 4. Mild bilateral carotid bifurcation atherosclerosis without hemodynamically significant stenosis by NASCET criteria. Aortic atherosclerosis (ICD10-I70.0). Electronically Signed   By: Ulyses Jarred M.D.   On: 11/09/2021 21:57   CT Head Wo Contrast  Addendum Date: 11/09/2021   ADDENDUM REPORT: 11/09/2021 14:49 ADDENDUM: Not included in the initial report, there is small volume sulcal subarachnoid hemorrhage adjacent to the parenchymal hemorrhage. Additionally, there is a 1.3 cm partially calcified meningioma of the anterior cranial fossa along the cribriform plate. Electronically Signed   By: Macy Mis M.D.   On: 11/09/2021 14:49   Result Date: 11/09/2021 CLINICAL DATA:  Facial trauma EXAM: CT HEAD WITHOUT CONTRAST TECHNIQUE: Contiguous axial images were obtained from the base of the skull through the vertex without intravenous contrast. COMPARISON:  None. FINDINGS: Brain: Large area of parenchymal hemorrhage is present in the right frontal lobe including the opercular region as well as the insula and subinsular white matter. This measures approximately 5.5 x 3.8 x 4.6 cm. Surrounding edema is present. There is mass effect including effacement of the right lateral ventricle and mild 4 mm leftward midline shift. No definite hydrocephalus. Prominence of ventricles and sulci reflects parenchymal volume loss. Gray-white differentiation is preserved. Patchy hypoattenuation in the supratentorial white matter is nonspecific but probably reflects chronic microvascular ischemic changes. Vascular: There is atherosclerotic calcification at the skull base. Skull: Calvarium is unremarkable. Sinuses/Orbits: No acute finding. Other: None. IMPRESSION: Right frontoinsular parenchymal hemorrhage with surrounding edema and regional mass effect including mild leftward midline shift. No definite hydrocephalus. These results were called by telephone at  the time of interpretation on  11/09/2021 at 2:39 pm to provider [facility] , who verbally acknowledged these results. Electronically Signed: By: Macy Mis M.D. On: 11/09/2021 14:41   CT Cervical Spine Wo Contrast  Result Date: 11/09/2021 CLINICAL DATA:  Neck trauma, intoxicated or obtunded (Age >= 16y) EXAM: CT CERVICAL SPINE WITHOUT CONTRAST TECHNIQUE: Multidetector CT imaging of the cervical spine was performed without intravenous contrast. Multiplanar CT image reconstructions were also generated. COMPARISON:  None. FINDINGS: Alignment: Mild anterolisthesis at C7-T1. Skull base and vertebrae: Vertebral body heights are maintained. No acute fracture. Soft tissues and spinal canal: No prevertebral fluid or swelling. No visible canal hematoma. Disc levels: Multilevel degenerative changes are present including disc space narrowing, endplate osteophytes, and facet and uncovertebral hypertrophy. No high-grade osseous encroachment on the spinal canal. Multilevel foraminal stenosis. Upper chest: Minimally included lung apices are clear. Other: Mild calcified plaque along the proximal internal carotids. IMPRESSION: No acute cervical spine fracture. Electronically Signed   By: Macy Mis M.D.   On: 11/09/2021 14:46   MR BRAIN WO CONTRAST  Result Date: 11/10/2021 CLINICAL DATA:  Stroke, follow-up. Confusion. Facial trauma. Subarachnoid hemorrhage. Intracranial hemorrhage. EXAM: MRI HEAD WITHOUT CONTRAST TECHNIQUE: Multiplanar, multiecho pulse sequences of the brain and surrounding structures were obtained without intravenous contrast. COMPARISON:  CT head without contrast and CTA head and neck 11/09/2021 FINDINGS: Brain: Study is moderately degraded by patient. Right frontal parenchymal hemorrhage is similar in size prior exam, measuring 6.1 x 3.5 x 3.9 cm. Small fluid level is noted anteriorly within the collection. Mass effect effaces the right lateral ventricle. 3 mm midline shift noted. No new hemorrhage is present. Surrounding  vasogenic edema present. Minimal subarachnoid or subdural blood along the falx is again seen. The meningioma along the plane of sphenoidale is not well seen due to patient motion. Periventricular white matter changes are otherwise mild. Small amount of blood is now evident within the posterior horns of the lateral ventricles bilaterally. No obstruction is present. Brainstem and cerebellum are unremarkable. Vascular: Flow is present in the major intracranial arteries. Skull and upper cervical spine: The craniocervical junction is normal. Upper cervical spine is within normal limits. Marrow signal is unremarkable. Sinuses/Orbits: The paranasal sinuses and mastoid air cells are clear. Bilateral lens replacements are noted. Globes and orbits are otherwise unremarkable. IMPRESSION: 1. Similar appearance of right frontal parenchymal hemorrhage measuring 6.1 x 3.5 x 3.9 cm. 2. Study is moderately degraded by patient motion, limiting detail. 3. Surrounding vasogenic edema and mass effect with 3 mm midline shift. 4. Small amount of intraventricular blood now evident within the posterior horns of the lateral ventricles bilaterally. No obstruction is present. 5. Minimal subarachnoid or subdural blood along the falx. 6. The meningioma along the plane of sphenoidale is not well seen due to patient motion. Electronically Signed   By: San Morelle M.D.   On: 11/10/2021 11:46   CT CHEST ABDOMEN PELVIS W CONTRAST  Result Date: 11/09/2021 CLINICAL DATA:  Abdominal trauma. EXAM: CT CHEST, ABDOMEN, AND PELVIS WITH CONTRAST TECHNIQUE: Multidetector CT imaging of the chest, abdomen and pelvis was performed following the standard protocol during bolus administration of intravenous contrast. CONTRAST:  168mL OMNIPAQUE IOHEXOL 300 MG/ML  SOLN COMPARISON:  January 28, 2011 FINDINGS: CT CHEST FINDINGS Cardiovascular: Enlarged heart. Calcific atherosclerotic disease of the aorta. No pericardial effusion. Mediastinum/Nodes: Large  hiatal hernia contains the stomach, small and large bowel loops. No evidence of incarceration. No evidence of mediastinal lymphadenopathy. Mild distension of the  esophagus. Lungs/Pleura: Bibasilar atelectasis. Musculoskeletal: No evidence of fracture. CT ABDOMEN PELVIS FINDINGS Hepatobiliary: No focal liver abnormality is seen. Status post cholecystectomy. No biliary dilatation. Pancreas: Unremarkable. No pancreatic ductal dilatation or surrounding inflammatory changes. Spleen: Normal in size without focal abnormality. Adrenals/Urinary Tract: Normal adrenal glands. Normal right kidney. 8 mm possibly dystrophic calculus within the lower pole of the left kidney, nonobstructive. Normal ureters and urinary bladder. Stomach/Bowel: No evidence of small-bowel obstruction. Diffuse colonic diverticulosis, particularly in the sigmoid colon. No evidence of diverticulitis. Vascular/Lymphatic: Aortic atherosclerosis. No enlarged abdominal or pelvic lymph nodes. Reproductive: Uterus and bilateral adnexa are unremarkable. Other: No abdominal wall hernia or abnormality. No abdominopelvic ascites. Musculoskeletal: No fracture is seen. Scoliosis and spondylosis of the spine. IMPRESSION: 1. No evidence of acute traumatic injury to the chest, abdomen or pelvis. 2. Large hiatal hernia contains the stomach, small and large bowel loops. No evidence of incarceration. 3. Diffuse colonic diverticulosis without evidence of diverticulitis. 4. 8 mm possibly dystrophic calculus within the lower pole of the left kidney, nonobstructive. 5. Enlarged heart. 6. Calcific atherosclerotic disease of the aorta. Aortic Atherosclerosis (ICD10-I70.0). Electronically Signed   By: Fidela Salisbury M.D.   On: 11/09/2021 18:48   DG CHEST PORT 1 VIEW  Result Date: 11/09/2021 CLINICAL DATA:  Cerebral bleeding. EXAM: PORTABLE CHEST 1 VIEW COMPARISON:  June 14, 2020 FINDINGS: Enlarged cardiac silhouette. Calcific atherosclerotic disease and tortuosity of the  aorta. Large hiatal hernia. Bibasilar atelectasis. Osseous structures are without acute abnormality. Soft tissues are grossly normal. IMPRESSION: No active disease. Electronically Signed   By: Fidela Salisbury M.D.   On: 11/09/2021 17:57   DG Hand Complete Right  Result Date: 11/09/2021 CLINICAL DATA:  Hand pain after fall. EXAM: RIGHT HAND - COMPLETE 3+ VIEW COMPARISON:  None. FINDINGS: Minimal minimally displaced fracture of the radial styloid process with extension to the radiocarpal joint. There is some osseous irregularity of the distal aspect of the scaphoid. Diffuse demineralization of bone. Chondrocalcinosis with degenerative change of the first [facility] joint, as well as the distal interphalangeal joints with some involvement of the MCPs. Soft tissue swelling in the wrist and proximal hand. IMPRESSION: 1. Minimal minimally displaced fracture of the radial styloid process with extension to the radiocarpal joint. 2. Osseous irregularity of the distal aspect of the scaphoid, which is favored to reflect degenerative change however nondisplaced fracture not entirely excluded. In the setting of anatomic snuffbox tenderness consider additional radiographs with dedicated scaphoid view. 3. Chondrocalcinosis with multifocal degenerative change likely reflecting degenerative osteoarthropathy with possible superimposed CPPD arthropathy. Electronically Signed   By: Dahlia Bailiff M.D.   On: 11/09/2021 16:08   EEG adult  Result Date: 11/10/2021 Lora Havens, MD     11/10/2021  9:21 PM Patient Name: Kristy [name] MRN: [id number] Epilepsy Attending: Lora Havens Referring Physician/Provider: Dr Antony Contras Date: 11/10/2021 Duration: 21.35 mins Patient history: 87yo F with right ICH. EEG to evaluate for seizure Level of alertness: Awake AEDs during EEG study: None Technical aspects: This EEG study was done with scalp electrodes positioned according to the 10-20 International system of electrode placement.  Electrical activity was acquired at a sampling rate of 500Hz  and reviewed with a high frequency filter of 70Hz  and a low frequency filter of 1Hz . EEG data were recorded continuously and digitally stored. Description: No clear posterior dominant rhythm was seen. EEG showed 5-7hz  theta slowing in left left hemisphere. EEG also showed continuous 3-5hz  theta-delta slowing in right hemisphere. Lateralized periodic discharges  were noted in right hemisphere, maximal right posterior quadrant at 1-1.5 Hz. These discharges at times appeared rhythmic consistent with brief ictal-interictal rhythmic discharges ( BIRDS).  Hyperventilation and photic stimulation were not performed.   ABNORMALITY - Brief ictal-interictal rhythmic discharges, right hemisphere, maximal right posterior quadrant - Lateralized periodic discharges, right hemisphere, maximal right posterior quadrant  - Continuous slow, generalized and lateralized right hemisphere IMPRESSION: This study showed evidence of epileptogenicity arising from right hemisphere, maximal right posterior quadrant . This eeg pattern is on the ictal-interictal continuum with high potential for seizures. No definite seizures were seen throughout the recording. Additionally the study is suggestive of cortical dysfunction arising from right hemisphere likely secondary to underlying structural abnormality/ ICH. Lastly, this study is suggestive of moderate diffuse encephalopathy, nonspecific etiology. Dr Lynetta Mare, Dr Leonie Man and Dr Erlinda Hong was notified. Priyanka Barbra Sarks   Overnight EEG with video  Result Date: 11/11/2021 Lora Havens, MD     11/12/2021 10:09 AM Patient Name: Kristy [name] MRN: [id number] Epilepsy Attending: Lora Havens Referring Physician/Provider: Dr Antony Contras Duration: 11/10/2021 1756 to 11/11/2021 1633  Patient history: 87yo F with right ICH. EEG to evaluate for seizure  Level of alertness: Awake, asleep  AEDs during EEG study: LEV  Technical aspects: This EEG study  was done with scalp electrodes positioned according to the 10-20 International system of electrode placement. Electrical activity was acquired at a sampling rate of 500Hz  and reviewed with a high frequency filter of 70Hz  and a low frequency filter of 1Hz . EEG data were recorded continuously and digitally stored.  Description: No clear posterior dominant rhythm was seen. Sleep was characterized by sleep spindles (12 to 14 Hz), maximum frontocentral region. EEG showed 5 to 9 Hz theta-alpha activity in left hemisphere. EEG also showed continuous 3-5hz  theta-delta slowing in right hemisphere.  Intermittently, lateralized periodic discharges were noted in right hemisphere, maximal right posterior quadrant at 1-1.5 Hz.  Hyperventilation and photic stimulation were not performed.    ABNORMALITY - Lateralized periodic discharges, right hemisphere, maximal right posterior quadrant  - Continuous slow, generalized and lateralized right hemisphere  IMPRESSION: This study showed evidence of epileptogenicity arising from right hemisphere, maximal right posterior quadrant. This eeg pattern is on the ictal-interictal continuum with intermediate to high potential for seizures. No definite seizures were seen throughout the recording.  Additionally the study is suggestive of cortical dysfunction arising from right hemisphere likely secondary to underlying structural abnormality/ ICH.  Lastly, this study is suggestive of moderate diffuse encephalopathy, nonspecific etiology.  Lora Havens   CT Maxillofacial Wo Contrast  Result Date: 11/09/2021 CLINICAL DATA:  Facial trauma EXAM: CT MAXILLOFACIAL WITHOUT CONTRAST TECHNIQUE: Multidetector CT imaging of the maxillofacial structures was performed. Multiplanar CT image reconstructions were also generated. COMPARISON:  None. FINDINGS: Osseous: No acute facial fracture. Orbits: No intraorbital hematoma.  Bilateral lens replacements. Sinuses: Patchy mucosal thickening. Soft tissues:  Right frontal scalp soft tissue swelling/hematoma. Limited intracranial: Dictated separately. IMPRESSION: No acute facial fracture. Electronically Signed   By: Macy Mis M.D.   On: 11/09/2021 14:51    DISCHARGE EXAMINATION: Vitals:   11/14/21 0355 11/14/21 0819 11/14/21 1217 11/14/21 2349  BP: (!) 158/85 (!) 135/55 (!) 109/59 (!) 113/52  Pulse: 63 (!) 56 64 68  Resp: 20 14 14 17   Temp: 97.6 F (36.4 C)   97.9 F (36.6 C)  TempSrc: Axillary   Oral  SpO2: 97% 96% 96% 94%  Weight:       General appearance:  Awake alert.  In no distress Resp: Clear to auscultation bilaterally.  Normal effort Cardio: S1-S2 is normal regular.  No S3-S4.  No rubs murmurs or bruit GI: Abdomen is soft.  Nontender nondistended.  Bowel sounds are present normal.  No masses organomegaly    DISPOSITION: SNF  Discharge Instructions     Ambulatory referral to Neurology   Complete by: As directed    An appointment is requested in approximately: 4 weeks for ICH   Call MD for:  difficulty breathing, headache or visual disturbances   Complete by: As directed    Call MD for:  extreme fatigue   Complete by: As directed    Call MD for:  persistant dizziness or light-headedness   Complete by: As directed    Call MD for:  persistant nausea and vomiting   Complete by: As directed    Call MD for:  severe uncontrolled pain   Complete by: As directed    Call MD for:  temperature >100.4   Complete by: As directed    Discharge instructions   Complete by: As directed    Please review instructions on the discharge summary.  You were cared for by a hospitalist during your hospital stay. If you have any questions about your discharge medications or the care you received while you were in the hospital after you are discharged, you can call the unit and asked to speak with the hospitalist on call if the hospitalist that took care of you is not available. Once you are discharged, your primary care physician will handle  any further medical issues. Please note that NO REFILLS for any discharge medications will be authorized once you are discharged, as it is imperative that you return to your primary care physician (or establish a relationship with a primary care physician if you do not have one) for your aftercare needs so that they can reassess your need for medications and monitor your lab values. If you do not have a primary care physician, you can call [phone] for a physician referral.   Increase activity slowly   Complete by: As directed           Allergies as of 11/15/2021       Reactions   Codeine Nausea Only   Latanoprost Other (See Comments)   Red and burning   Tramadol Nausea Only        Medication List     STOP taking these medications    ALPRAZolam 0.25 MG tablet Commonly known as: XANAX   hydrochlorothiazide 12.5 MG tablet Commonly known as: HYDRODIURIL   Rivaroxaban 15 MG Tabs tablet Commonly known as: XARELTO       TAKE these medications    atorvastatin 20 MG tablet Commonly known as: LIPITOR Take 1 tablet (20 mg total) by mouth daily.   dorzolamide-timolol 22.3-6.8 MG/ML ophthalmic solution Commonly known as: COSOPT Place 1 drop into both eyes 2 (two) times daily.   EYE VITAMINS PO Take 1 tablet by mouth daily.   hydroxyurea 500 MG capsule Commonly known as: HYDREA Take one tablet by mouth on Monday and Thursday What changed:  how much to take how to take this when to take this additional instructions   levETIRAcetam 250 MG tablet Commonly known as: KEPPRA Take 1 tablet (250 mg total) by mouth 2 (two) times daily.   meclizine 12.5 MG tablet Commonly known as: ANTIVERT Take 1 tablet (12.5 mg total) by mouth as needed. Take as needed for  vertigo/  dizziness   melatonin 3 MG Tabs tablet Take 1 tablet (3 mg total) by mouth at bedtime as needed.   metoprolol succinate 25 MG 24 hr tablet Commonly known as: TOPROL-XL Take 1 tablet (25 mg total) by  mouth daily.   omeprazole 20 MG capsule Commonly known as: PRILOSEC TAKE 1 CAPSULE BY MOUTH  DAILY   polyethylene glycol 17 g packet Commonly known as: MIRALAX / GLYCOLAX Take 17 g by mouth daily as needed for moderate constipation.   sertraline 50 MG tablet Commonly known as: Zoloft Take 1 tablet (50 mg total) by mouth daily.   SYSTANE OP Apply 1-2 drops to eye 4 (four) times daily as needed (dry eyes).   Vitamin D3 50 MCG (2000 UT) Tabs Take 4,000 Units by mouth daily.          Contact information for follow-up providers     Marchia Bond, MD. Schedule an appointment as soon as possible for a visit in 1 week(s).   Specialty: Orthopedic Surgery Why: 1 week after discharge Contact information: [place] [address] [phone]              Contact information for after-discharge care     Destination     [facility] .   Service: Skilled Nursing Contact information: [address] [place]                     TOTAL DISCHARGE TIME: 26 minutes  [facility] Pager on [email or web address]  11/15/2021, 6:43 AM

## 2021-11-15 NOTE — Progress Notes (Signed)
Attempted to call report to [facility] facility, no answer. [facility] here to transport pt. Son at bedside will take all belongings with him.

## 2021-11-17 DIAGNOSIS — I10 Essential (primary) hypertension: Secondary | ICD-10-CM | POA: Diagnosis not present

## 2021-11-17 DIAGNOSIS — E559 Vitamin D deficiency, unspecified: Secondary | ICD-10-CM | POA: Diagnosis not present

## 2021-11-17 DIAGNOSIS — E785 Hyperlipidemia, unspecified: Secondary | ICD-10-CM | POA: Diagnosis not present

## 2021-11-17 DIAGNOSIS — G4700 Insomnia, unspecified: Secondary | ICD-10-CM | POA: Diagnosis not present

## 2021-11-17 DIAGNOSIS — S52514D Nondisplaced fracture of right radial styloid process, subsequent encounter for closed fracture with routine healing: Secondary | ICD-10-CM | POA: Diagnosis not present

## 2021-11-17 DIAGNOSIS — E871 Hypo-osmolality and hyponatremia: Secondary | ICD-10-CM | POA: Diagnosis not present

## 2021-11-17 DIAGNOSIS — K5900 Constipation, unspecified: Secondary | ICD-10-CM | POA: Diagnosis not present

## 2021-11-17 DIAGNOSIS — I480 Paroxysmal atrial fibrillation: Secondary | ICD-10-CM | POA: Diagnosis not present

## 2021-11-18 DIAGNOSIS — M6281 Muscle weakness (generalized): Secondary | ICD-10-CM | POA: Diagnosis not present

## 2021-11-18 DIAGNOSIS — I10 Essential (primary) hypertension: Secondary | ICD-10-CM | POA: Diagnosis not present

## 2021-11-19 DIAGNOSIS — E785 Hyperlipidemia, unspecified: Secondary | ICD-10-CM | POA: Diagnosis not present

## 2021-11-19 DIAGNOSIS — R29818 Other symptoms and signs involving the nervous system: Secondary | ICD-10-CM | POA: Diagnosis not present

## 2021-11-19 DIAGNOSIS — F32A Depression, unspecified: Secondary | ICD-10-CM | POA: Diagnosis not present

## 2021-11-19 DIAGNOSIS — G4700 Insomnia, unspecified: Secondary | ICD-10-CM | POA: Diagnosis not present

## 2021-11-19 DIAGNOSIS — M25511 Pain in right shoulder: Secondary | ICD-10-CM | POA: Diagnosis not present

## 2021-11-19 DIAGNOSIS — D72829 Elevated white blood cell count, unspecified: Secondary | ICD-10-CM | POA: Diagnosis not present

## 2021-11-19 DIAGNOSIS — I480 Paroxysmal atrial fibrillation: Secondary | ICD-10-CM | POA: Diagnosis not present

## 2021-11-19 DIAGNOSIS — I10 Essential (primary) hypertension: Secondary | ICD-10-CM | POA: Diagnosis not present

## 2021-11-19 DIAGNOSIS — R569 Unspecified convulsions: Secondary | ICD-10-CM | POA: Diagnosis not present

## 2021-11-19 DIAGNOSIS — K219 Gastro-esophageal reflux disease without esophagitis: Secondary | ICD-10-CM | POA: Diagnosis not present

## 2021-11-19 DIAGNOSIS — K5900 Constipation, unspecified: Secondary | ICD-10-CM | POA: Diagnosis not present

## 2021-11-19 DIAGNOSIS — G8929 Other chronic pain: Secondary | ICD-10-CM | POA: Diagnosis not present

## 2021-11-20 DIAGNOSIS — D72829 Elevated white blood cell count, unspecified: Secondary | ICD-10-CM | POA: Diagnosis not present

## 2021-11-20 DIAGNOSIS — I10 Essential (primary) hypertension: Secondary | ICD-10-CM | POA: Diagnosis not present

## 2021-11-20 DIAGNOSIS — M25511 Pain in right shoulder: Secondary | ICD-10-CM | POA: Diagnosis not present

## 2021-11-20 DIAGNOSIS — E871 Hypo-osmolality and hyponatremia: Secondary | ICD-10-CM | POA: Diagnosis not present

## 2021-11-20 DIAGNOSIS — R0989 Other specified symptoms and signs involving the circulatory and respiratory systems: Secondary | ICD-10-CM | POA: Diagnosis not present

## 2021-11-20 DIAGNOSIS — G4700 Insomnia, unspecified: Secondary | ICD-10-CM | POA: Diagnosis not present

## 2021-11-20 DIAGNOSIS — E559 Vitamin D deficiency, unspecified: Secondary | ICD-10-CM | POA: Diagnosis not present

## 2021-11-20 DIAGNOSIS — R29818 Other symptoms and signs involving the nervous system: Secondary | ICD-10-CM | POA: Diagnosis not present

## 2021-11-20 DIAGNOSIS — K5900 Constipation, unspecified: Secondary | ICD-10-CM | POA: Diagnosis not present

## 2021-11-20 DIAGNOSIS — K219 Gastro-esophageal reflux disease without esophagitis: Secondary | ICD-10-CM | POA: Diagnosis not present

## 2021-11-20 DIAGNOSIS — R5381 Other malaise: Secondary | ICD-10-CM | POA: Diagnosis not present

## 2021-11-20 DIAGNOSIS — E785 Hyperlipidemia, unspecified: Secondary | ICD-10-CM | POA: Diagnosis not present

## 2021-11-20 DIAGNOSIS — I480 Paroxysmal atrial fibrillation: Secondary | ICD-10-CM | POA: Diagnosis not present

## 2021-11-20 DIAGNOSIS — Z7901 Long term (current) use of anticoagulants: Secondary | ICD-10-CM | POA: Diagnosis not present

## 2021-11-20 DIAGNOSIS — Z5181 Encounter for therapeutic drug level monitoring: Secondary | ICD-10-CM | POA: Diagnosis not present

## 2021-11-24 DIAGNOSIS — M25511 Pain in right shoulder: Secondary | ICD-10-CM | POA: Diagnosis not present

## 2021-11-24 DIAGNOSIS — R29818 Other symptoms and signs involving the nervous system: Secondary | ICD-10-CM | POA: Diagnosis not present

## 2021-11-24 DIAGNOSIS — E876 Hypokalemia: Secondary | ICD-10-CM | POA: Diagnosis not present

## 2021-11-24 DIAGNOSIS — G8929 Other chronic pain: Secondary | ICD-10-CM | POA: Diagnosis not present

## 2021-11-24 DIAGNOSIS — K5900 Constipation, unspecified: Secondary | ICD-10-CM | POA: Diagnosis not present

## 2021-11-24 DIAGNOSIS — R5381 Other malaise: Secondary | ICD-10-CM | POA: Diagnosis not present

## 2021-11-24 DIAGNOSIS — F32A Depression, unspecified: Secondary | ICD-10-CM | POA: Diagnosis not present

## 2021-11-24 DIAGNOSIS — M25512 Pain in left shoulder: Secondary | ICD-10-CM | POA: Diagnosis not present

## 2021-11-24 DIAGNOSIS — E871 Hypo-osmolality and hyponatremia: Secondary | ICD-10-CM | POA: Diagnosis not present

## 2021-11-24 DIAGNOSIS — K219 Gastro-esophageal reflux disease without esophagitis: Secondary | ICD-10-CM | POA: Diagnosis not present

## 2021-11-27 DIAGNOSIS — S52501D Unspecified fracture of the lower end of right radius, subsequent encounter for closed fracture with routine healing: Secondary | ICD-10-CM | POA: Diagnosis not present

## 2021-11-27 DIAGNOSIS — M25511 Pain in right shoulder: Secondary | ICD-10-CM | POA: Diagnosis not present

## 2021-12-10 ENCOUNTER — Other Ambulatory Visit (HOSPITAL_COMMUNITY): Payer: Self-pay | Admitting: *Deleted

## 2021-12-10 DIAGNOSIS — E559 Vitamin D deficiency, unspecified: Secondary | ICD-10-CM | POA: Diagnosis not present

## 2021-12-10 DIAGNOSIS — K5900 Constipation, unspecified: Secondary | ICD-10-CM | POA: Diagnosis not present

## 2021-12-10 DIAGNOSIS — G8929 Other chronic pain: Secondary | ICD-10-CM | POA: Diagnosis not present

## 2021-12-10 DIAGNOSIS — R1310 Dysphagia, unspecified: Secondary | ICD-10-CM | POA: Diagnosis not present

## 2021-12-10 DIAGNOSIS — Z1589 Genetic susceptibility to other disease: Secondary | ICD-10-CM

## 2021-12-10 DIAGNOSIS — R5381 Other malaise: Secondary | ICD-10-CM | POA: Diagnosis not present

## 2021-12-10 DIAGNOSIS — M25511 Pain in right shoulder: Secondary | ICD-10-CM | POA: Diagnosis not present

## 2021-12-10 DIAGNOSIS — E785 Hyperlipidemia, unspecified: Secondary | ICD-10-CM | POA: Diagnosis not present

## 2021-12-10 DIAGNOSIS — D473 Essential (hemorrhagic) thrombocythemia: Secondary | ICD-10-CM

## 2021-12-10 DIAGNOSIS — I10 Essential (primary) hypertension: Secondary | ICD-10-CM | POA: Diagnosis not present

## 2021-12-11 ENCOUNTER — Other Ambulatory Visit: Payer: Self-pay

## 2021-12-11 ENCOUNTER — Emergency Department (HOSPITAL_COMMUNITY): Payer: Medicare Other

## 2021-12-11 ENCOUNTER — Telehealth: Payer: Self-pay | Admitting: Neurology

## 2021-12-11 ENCOUNTER — Ambulatory Visit (HOSPITAL_COMMUNITY): Payer: Medicare Other | Admitting: Physician Assistant

## 2021-12-11 ENCOUNTER — Inpatient Hospital Stay (HOSPITAL_COMMUNITY): Payer: Medicare Other

## 2021-12-11 ENCOUNTER — Emergency Department (HOSPITAL_COMMUNITY)
Admission: EM | Admit: 2021-12-11 | Discharge: 2021-12-12 | Disposition: A | Discharge status: Home / Self Care | Payer: Medicare Other | Attending: Emergency Medicine | Admitting: Emergency Medicine

## 2021-12-11 ENCOUNTER — Encounter (HOSPITAL_COMMUNITY): Payer: Self-pay

## 2021-12-11 DIAGNOSIS — D320 Benign neoplasm of cerebral meninges: Secondary | ICD-10-CM | POA: Insufficient documentation

## 2021-12-11 DIAGNOSIS — Z743 Need for continuous supervision: Secondary | ICD-10-CM | POA: Diagnosis not present

## 2021-12-11 DIAGNOSIS — S0990XA Unspecified injury of head, initial encounter: Secondary | ICD-10-CM | POA: Diagnosis not present

## 2021-12-11 DIAGNOSIS — Z20822 Contact with and (suspected) exposure to covid-19: Secondary | ICD-10-CM | POA: Diagnosis not present

## 2021-12-11 DIAGNOSIS — F0390 Unspecified dementia without behavioral disturbance: Secondary | ICD-10-CM | POA: Insufficient documentation

## 2021-12-11 DIAGNOSIS — R58 Hemorrhage, not elsewhere classified: Secondary | ICD-10-CM | POA: Diagnosis not present

## 2021-12-11 DIAGNOSIS — K449 Diaphragmatic hernia without obstruction or gangrene: Secondary | ICD-10-CM | POA: Diagnosis not present

## 2021-12-11 DIAGNOSIS — I4891 Unspecified atrial fibrillation: Secondary | ICD-10-CM | POA: Diagnosis not present

## 2021-12-11 DIAGNOSIS — I615 Nontraumatic intracerebral hemorrhage, intraventricular: Secondary | ICD-10-CM | POA: Diagnosis not present

## 2021-12-11 DIAGNOSIS — S0003XA Contusion of scalp, initial encounter: Secondary | ICD-10-CM | POA: Diagnosis not present

## 2021-12-11 DIAGNOSIS — Y9389 Activity, other specified: Secondary | ICD-10-CM | POA: Insufficient documentation

## 2021-12-11 DIAGNOSIS — S3993XA Unspecified injury of pelvis, initial encounter: Secondary | ICD-10-CM | POA: Diagnosis not present

## 2021-12-11 DIAGNOSIS — M2578 Osteophyte, vertebrae: Secondary | ICD-10-CM | POA: Diagnosis not present

## 2021-12-11 DIAGNOSIS — W1811XA Fall from or off toilet without subsequent striking against object, initial encounter: Secondary | ICD-10-CM | POA: Insufficient documentation

## 2021-12-11 DIAGNOSIS — G319 Degenerative disease of nervous system, unspecified: Secondary | ICD-10-CM | POA: Diagnosis not present

## 2021-12-11 DIAGNOSIS — R6889 Other general symptoms and signs: Secondary | ICD-10-CM | POA: Diagnosis not present

## 2021-12-11 DIAGNOSIS — J9811 Atelectasis: Secondary | ICD-10-CM | POA: Diagnosis not present

## 2021-12-11 DIAGNOSIS — Y92009 Unspecified place in unspecified non-institutional (private) residence as the place of occurrence of the external cause: Secondary | ICD-10-CM | POA: Diagnosis not present

## 2021-12-11 DIAGNOSIS — R4182 Altered mental status, unspecified: Secondary | ICD-10-CM | POA: Diagnosis present

## 2021-12-11 DIAGNOSIS — W19XXXA Unspecified fall, initial encounter: Secondary | ICD-10-CM

## 2021-12-11 DIAGNOSIS — T1490XA Injury, unspecified, initial encounter: Secondary | ICD-10-CM

## 2021-12-11 DIAGNOSIS — R0902 Hypoxemia: Secondary | ICD-10-CM | POA: Diagnosis not present

## 2021-12-11 DIAGNOSIS — G936 Cerebral edema: Secondary | ICD-10-CM | POA: Diagnosis not present

## 2021-12-11 LAB — CBC
HCT: 38.1 % (ref 36.0–46.0)
Hemoglobin: 12.5 g/dL (ref 12.0–15.0)
MCH: 29.1 pg (ref 26.0–34.0)
MCHC: 32.8 g/dL (ref 30.0–36.0)
MCV: 88.8 fL (ref 80.0–100.0)
Platelets: 260 10*3/uL (ref 150–400)
RBC: 4.29 MIL/uL (ref 3.87–5.11)
RDW: 15.7 % — ABNORMAL HIGH (ref 11.5–15.5)
WBC: 9.1 10*3/uL (ref 4.0–10.5)
nRBC: 0 % (ref 0.0–0.2)

## 2021-12-11 LAB — COMPREHENSIVE METABOLIC PANEL
ALT: 14 U/L (ref 0–44)
AST: 23 U/L (ref 15–41)
Albumin: 3.5 g/dL (ref 3.5–5.0)
Alkaline Phosphatase: 72 U/L (ref 38–126)
Anion gap: 10 (ref 5–15)
BUN: 18 mg/dL (ref 8–23)
CO2: 25 mmol/L (ref 22–32)
Calcium: 9.2 mg/dL (ref 8.9–10.3)
Chloride: 104 mmol/L (ref 98–111)
Creatinine, Ser: 1.02 mg/dL — ABNORMAL HIGH (ref 0.44–1.00)
GFR, Estimated: 52 mL/min — ABNORMAL LOW (ref >60)
Glucose, Bld: 126 mg/dL — ABNORMAL HIGH (ref 70–99)
Potassium: 3.4 mmol/L — ABNORMAL LOW (ref 3.5–5.1)
Sodium: 139 mmol/L (ref 135–145)
Total Bilirubin: 0.9 mg/dL (ref 0.3–1.2)
Total Protein: 6 g/dL — ABNORMAL LOW (ref 6.5–8.1)

## 2021-12-11 LAB — I-STAT CHEM 8, ED
BUN: 19 mg/dL (ref 8–23)
Calcium, Ion: 1.16 mmol/L (ref 1.15–1.40)
Chloride: 104 mmol/L (ref 98–111)
Creatinine, Ser: 1 mg/dL (ref 0.44–1.00)
Glucose, Bld: 123 mg/dL — ABNORMAL HIGH (ref 70–99)
HCT: 35 % — ABNORMAL LOW (ref 36.0–46.0)
Hemoglobin: 11.9 g/dL — ABNORMAL LOW (ref 12.0–15.0)
Potassium: 3.4 mmol/L — ABNORMAL LOW (ref 3.5–5.1)
Sodium: 141 mmol/L (ref 135–145)
TCO2: 27 mmol/L (ref 22–32)

## 2021-12-11 LAB — PROTIME-INR
INR: 1.1 (ref 0.8–1.2)
Prothrombin Time: 13.9 seconds (ref 11.4–15.2)

## 2021-12-11 LAB — RESP PANEL BY RT-PCR (FLU A&B, COVID) ARPGX2
Influenza A by PCR: NEGATIVE
Influenza B by PCR: NEGATIVE
SARS Coronavirus 2 by RT PCR: NEGATIVE

## 2021-12-11 LAB — ETHANOL: Alcohol, Ethyl (B): <10 mg/dL (ref <10)

## 2021-12-11 LAB — LACTIC ACID, PLASMA: Lactic Acid, Venous: 1 mmol/L (ref 0.5–1.9)

## 2021-12-11 NOTE — Progress Notes (Signed)
Orthopedic Tech Progress Note Patient Details:  Kristy [name] 11-02-1928 [id number]  Level 2 trauma   Patient ID: Kristy [name], female   DOB: [birth year], 88 y.o.   MRN: [id number]  Kristy [name] 12/11/2021, 8:54 PM

## 2021-12-11 NOTE — Telephone Encounter (Signed)
[phone] best call back for [facility]. Patient's son wants a call back from nurse

## 2021-12-11 NOTE — ED Triage Notes (Signed)
Pt bib [facility] from [facility] after fall in bathroom around 5:45pm. No LOC reported, pt axo x4. Hematoma noted to back of head. Pt had fall a month ago, has an active head bleed and fracture to R wrist. BP 176/98, O2 94% RA, HR 60, 22G L wrist.

## 2021-12-11 NOTE — ED Provider Notes (Signed)
[facility] EMERGENCY DEPARTMENT Provider Note   CSN: [id number] Arrival date & time: 12/11/21  1935     History  Chief Complaint  Patient presents with   Kristy [name] is a 88 y.o. female.  Patient is a 88 yo female presenting from nursing home for unwitnessed fall. 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 Patient's son states he went to visit her at nursing home today and found her on the floor. Evidence of head trauma. Unknown LOC. No blood thinners. Patient able to move all four ext without difficulty.   The history is provided by the patient. No language interpreter was used.  Fall Pertinent negatives include no chest pain, no abdominal pain and no shortness of breath.      Home Medications Prior to Admission medications   Medication Sig Start Date End Date Taking? Authorizing Provider  atorvastatin (LIPITOR) 20 MG tablet Take 1 tablet (20 mg total) by mouth daily. 11/14/21   Bonnielee Haff, MD  Cholecalciferol (VITAMIN D3) 2000 UNITS TABS Take 4,000 Units by mouth daily.    [provider]  dorzolamide-timolol (COSOPT) 22.3-6.8 MG/ML ophthalmic solution Place 1 drop into both eyes 2 (two) times daily. 09/09/20   [provider]  hydroxyurea (HYDREA) 500 MG capsule Take one tablet by mouth on Monday and Thursday Patient taking differently: Take 500 mg by mouth 2 (two) times a week. on Monday and Thursday 10/09/21   Tarri Abernethy M, PA-C  levETIRAcetam (KEPPRA) 250 MG tablet Take 1 tablet (250 mg total) by mouth 2 (two) times daily. 11/14/21   Bonnielee Haff, MD  meclizine (ANTIVERT) 12.5 MG tablet Take 1 tablet (12.5 mg total) by mouth as needed. Take as needed for  vertigo/ dizziness Patient taking differently: Take 12.5 mg by mouth as needed. Take as needed for  vertigo/ dizziness 02/06/21   Derek Jack, MD  melatonin 3 MG TABS tablet Take 1 tablet (3 mg total) by  mouth at bedtime as needed. 11/14/21   Bonnielee Haff, MD  metoprolol succinate (TOPROL-XL) 25 MG 24 hr tablet Take 1 tablet (25 mg total) by mouth daily. 02/26/21   Minus Breeding, MD  Multiple Vitamins-Minerals (EYE VITAMINS PO) Take 1 tablet by mouth daily.    [provider]  omeprazole (PRILOSEC) 20 MG capsule TAKE 1 CAPSULE BY MOUTH  DAILY 02/07/21   Ronnie Doss M, DO  Polyethyl Glycol-Propyl Glycol (SYSTANE OP) Apply 1-2 drops to eye 4 (four) times daily as needed (dry eyes).    [provider]  polyethylene glycol (MIRALAX / GLYCOLAX) 17 g packet Take 17 g by mouth daily as needed for moderate constipation. 11/14/21   Bonnielee Haff, MD  sertraline (ZOLOFT) 50 MG tablet Take 1 tablet (50 mg total) by mouth daily. 04/01/21   [name], DO      Allergies    Codeine, Latanoprost, and Tramadol    Review of Systems   Review of Systems  Constitutional:  Negative for chills and fever.  HENT:  Negative for ear pain and sore throat.   Eyes:  Negative for pain and visual disturbance.  Respiratory:  Negative for cough and shortness of breath.   Cardiovascular:  Negative for chest pain and palpitations.  Gastrointestinal:  Negative for abdominal pain and vomiting.  Genitourinary:  Negative for dysuria and hematuria.  Musculoskeletal:  Negative for arthralgias and back pain.  Skin:  Negative for color change and  rash.  Neurological:  Negative for seizures and syncope.  All other systems reviewed and are negative.  Physical Exam Updated Vital Signs BP (!) 146/74    Pulse 68    Temp 97.7 F (36.5 C) (Oral)    Resp 15    SpO2 98%  Physical Exam Vitals and nursing note reviewed.  Constitutional:      General: She is not in acute distress.    Appearance: She is well-developed.  HENT:     Head: Normocephalic. Contusion present.   Eyes:     General: Lids are normal. Vision grossly intact.     Conjunctiva/sclera: Conjunctivae normal.     Pupils: Pupils are  equal, round, and reactive to light.  Cardiovascular:     Rate and Rhythm: Normal rate and regular rhythm.     Heart sounds: No murmur heard. Pulmonary:     Effort: Pulmonary effort is normal. No respiratory distress.     Breath sounds: Normal breath sounds.  Abdominal:     Palpations: Abdomen is soft.     Tenderness: There is no abdominal tenderness.  Musculoskeletal:        General: No swelling.     Right shoulder: Normal.     Left shoulder: Normal.     Right upper arm: Normal.     Left upper arm: Normal.     Right elbow: Normal.     Left elbow: Normal.     Right forearm: Normal.     Left forearm: Normal.     Right wrist: Normal.     Left wrist: Normal.     Right hand: Normal.     Left hand: Normal.       Arms:     Cervical back: Neck supple. No bony tenderness.     Thoracic back: No bony tenderness.     Lumbar back: No bony tenderness.     Right hip: Normal.     Left hip: Normal.     Right upper leg: Normal.     Left upper leg: Normal.     Right knee: Normal.     Left knee: Normal.     Right lower leg: Normal.     Left lower leg: Normal.  Skin:    General: Skin is warm and dry.     Capillary Refill: Capillary refill takes less than 2 seconds.  Neurological:     Mental Status: She is alert. She is disoriented.     GCS: GCS eye subscore is 4. GCS verbal subscore is 5. GCS motor subscore is 6.     Sensory: Sensation is intact.     Motor: Motor function is intact.  Psychiatric:        Mood and Affect: Mood normal.    ED Results / Procedures / Treatments   Labs (all labs ordered are listed, but only abnormal results are displayed) Labs Reviewed  RESP PANEL BY RT-PCR (FLU A&B, COVID) ARPGX2  COMPREHENSIVE METABOLIC PANEL  CBC  ETHANOL  URINALYSIS, ROUTINE W REFLEX MICROSCOPIC  LACTIC ACID, PLASMA  PROTIME-INR  I-STAT CHEM 8, ED  SAMPLE TO BLOOD BANK    EKG None  Radiology No results found.  Procedures Procedures    Medications Ordered in  ED Medications - No data to display  ED Course/ Medical Decision Making/ A&P                           Medical Decision Making  8:11 PM  88 yo female presenting from nursing home for unwitnessed fall with evidence of head trauma. Patient alert and not oriented-baseline as per son. Contusion to occipital scalp present. No bleeding. No bony tenderness on exam. Ecchymosis on R shoulder and L arm old as per son. CT head and cervical spine demonstrates no acute process. Improvement of recent hemorrhage. Stable labs.  Patient in no distress and overall condition improved here in the ED. Detailed discussions were had with the patient regarding current findings, and need for close f/u with PCP or on call doctor. The patient has been instructed to return immediately if the symptoms worsen in any way for re-evaluation. Patient verbalized understanding and is in agreement with current care plan. All questions answered prior to discharge.         Final Clinical Impression(s) / ED Diagnoses Final diagnoses:  Trauma  Fall, initial encounter  Contusion of scalp, initial encounter    Rx / DC Orders ED Discharge Orders     None         Lianne Cure, DO 01/56/15 2115

## 2021-12-11 NOTE — ED Notes (Signed)
Trauma Response Nurse Documentation   Kristy [name] is a 88 y.[name]. female arriving to [facility] ED via EMS.On Xarelto (rivaroxaban) daily. Trauma was activated as a Level 2 by ED charge based on the following trauma criteria: Elderly patients > 65 with head trauma on anti-coagulation (excluding ASA). Trauma team at the bedside on patient arrival. Patient cleared for CT by Dr. Pearline Cables EDP. Patient to CT with team. GCS 14, which is baseline for patient. Of note, patient had a fall last month that resulting in [place] and right wrist fx.  History   Past Medical History:  Diagnosis Date   A-fib [facility])    Cataract    Closed left arm fracture 2016   Colon polyps    Diverticulosis    Endometrial hyperplasia without atypia, simple    Endometrial polyp    Fatty liver    GERD (gastroesophageal reflux disease)    Hiatal hernia    Hyperplastic colon polyp    Hypertension    Lipoma    Obesity    Optic neuropathy of both eyes 1989, 1997   SOB (shortness of breath)    Thrombocytosis      Past Surgical History:  Procedure Laterality Date   breast biopsy     CATARACT EXTRACTION     CHOLECYSTECTOMY  03/2002   DILATION AND CURETTAGE OF UTERUS     x 2   EYE SURGERY     HERNIA REPAIR     KNEE SURGERY     optic neuritis     umbilical hernia repair  09/2002       Initial Focused Assessment (If applicable, or please see trauma documentation): Alert but confused female patient arrives via EMS, no c-collar in place. Son (who is also POA) at bedside. Cast to right wrist from previous fall. Pre-hospital IV in place in left wrist.  CT's Completed:   CT Head & cervical spine  Interventions:  XRAY, CT, trauma lab draw, COVID swab, wound care  Plan for disposition:  Discharge home   Consults completed:  none at the time of this note.  Event Summary: Patient arrives via EMS from skilled nursing facility, where she fell in the bathroom. Patient confused at baseline, GCS 14. Hematoma/abrasion to back of  head. Son at bedside upon arrival to ED. Portable XRAYS completed, TRN escorted patient to CT. Results unremarkable at this time, ICH improved on CT results. Anticipate discharge back to facility.  MTP Summary (If applicable): NA  Bedside handoff with ED RN Kristy [name].    Kristy [name] Kristy [name]  Trauma Response RN  Please call TRN at [phone] for further assistance.

## 2021-12-11 NOTE — ED Notes (Signed)
Called ptar for pt #5 on the list

## 2021-12-11 NOTE — Telephone Encounter (Signed)
She has a pending hospital follow up on 01/26/22. He would like her to be seen sooner. The appt has been changed to 01/13/22 and she has been put on the wait list. He appreciated the call.

## 2021-12-15 DIAGNOSIS — K219 Gastro-esophageal reflux disease without esophagitis: Secondary | ICD-10-CM | POA: Diagnosis not present

## 2021-12-15 DIAGNOSIS — R5381 Other malaise: Secondary | ICD-10-CM | POA: Diagnosis not present

## 2021-12-15 DIAGNOSIS — R1310 Dysphagia, unspecified: Secondary | ICD-10-CM | POA: Diagnosis not present

## 2021-12-15 DIAGNOSIS — M25512 Pain in left shoulder: Secondary | ICD-10-CM | POA: Diagnosis not present

## 2021-12-15 DIAGNOSIS — R29818 Other symptoms and signs involving the nervous system: Secondary | ICD-10-CM | POA: Diagnosis not present

## 2021-12-15 DIAGNOSIS — M25511 Pain in right shoulder: Secondary | ICD-10-CM | POA: Diagnosis not present

## 2021-12-15 DIAGNOSIS — E785 Hyperlipidemia, unspecified: Secondary | ICD-10-CM | POA: Diagnosis not present

## 2021-12-15 DIAGNOSIS — I480 Paroxysmal atrial fibrillation: Secondary | ICD-10-CM | POA: Diagnosis not present

## 2021-12-15 DIAGNOSIS — I10 Essential (primary) hypertension: Secondary | ICD-10-CM | POA: Diagnosis not present

## 2021-12-15 DIAGNOSIS — K5900 Constipation, unspecified: Secondary | ICD-10-CM | POA: Diagnosis not present

## 2021-12-15 DIAGNOSIS — S52501D Unspecified fracture of the lower end of right radius, subsequent encounter for closed fracture with routine healing: Secondary | ICD-10-CM | POA: Diagnosis not present

## 2021-12-15 DIAGNOSIS — G8929 Other chronic pain: Secondary | ICD-10-CM | POA: Diagnosis not present

## 2021-12-16 ENCOUNTER — Telehealth: Payer: Self-pay | Admitting: Family Medicine

## 2021-12-16 NOTE — Telephone Encounter (Signed)
Spoke to patient's son.  We talked about delirium, ways to help her loose bowels.  I recommended Probiotica.  He is currently in the midst of trying to get her moved from her current facility to 1 in [place] with ultimate plan to transition her over to [place].  Highly recommended that she follow-up with neurology for guidance on when she may or may not come off of the seizure prophylaxis.  I encouraged him to contact me should any other concerns or questions arise.  He voiced great appreciation for the call

## 2021-12-17 ENCOUNTER — Other Ambulatory Visit: Payer: Self-pay

## 2021-12-17 ENCOUNTER — Inpatient Hospital Stay (HOSPITAL_COMMUNITY): Payer: Medicare Other | Attending: Physician Assistant | Admitting: Physician Assistant

## 2021-12-17 VITALS — Wt 130.0 lb

## 2021-12-17 DIAGNOSIS — D473 Essential (hemorrhagic) thrombocythemia: Secondary | ICD-10-CM | POA: Diagnosis not present

## 2021-12-17 DIAGNOSIS — Z1589 Genetic susceptibility to other disease: Secondary | ICD-10-CM

## 2021-12-17 NOTE — Progress Notes (Signed)
Virtual Visit via Telephone Note [facility]  I connected with Kristy [name]  on 12/17/21 at  1:17 PM by telephone and verified that I am speaking with the correct person using two identifiers.  Her son, Shanon Brow, is also present on this telephone visit to assist with providing history.  Location: Patient: Home Provider: [facility]   I discussed the limitations, risks, security and privacy concerns of performing an evaluation and management service by telephone and the availability of in person appointments. I also discussed with the patient that there may be a patient responsible charge related to this service. The patient expressed understanding and agreed to proceed.  REASON FOR VISIT:  Follow-up for JAK2 + essential thrombocytosis   CURRENT THERAPY: Hydrea 500 mg twice weekly (Monday and Thursday)  HISTORY OF PRESENT ILLNESS: Kristy [name] follows at our clinic for essential thrombocytosis.  She was last seen in clinic by Tarri Abernethy PA-C on 10/09/2021.  Since her last visit, patient sustained a fall on 11/08/2021 with subsequent right frontal intraparenchymal hemorrhagic stroke and right radial fracture.  She was discharged to SNF for further recovery, where she is still residing for rehab treatment at this time.  She is contacted via telephone today, and her son is also present with her at the time of the call and assists with providing history.  Regarding her essential thrombocytosis, she had previously stopped taking Hydrea but restarted it after she noticed increased symptoms of dizziness and vertigo.  She was restarted on Hydrea at her last visit (10/09/2021), and is currently taking it twice weekly.  She has not noted any major side effects of Hydrea such as mouth sores, skin sores, or GI symptoms at this time.  Recently, she has not been complaining of any vasomotor symptoms such as dizziness, headache, aquagenic pruritus, or erythromelalgia. She  has chronic blurry vision secondary to glaucoma and macular degeneration. She denies any current signs or symptoms of blood clots; no history of DVT or PE. She denies any B symptoms such as fever, chills, night sweats.  She continues to have chronic dyspnea on exertion. In light of her recent hemorrhagic stroke, she is no longer taking aspirin or Xarelto.  Apart from her hemorrhagic stroke she has not had any other bleeding events such as hematemesis, hematochezia, melena, or epistaxis.  She reports little energy since her fall and hemorrhagic stroke, and good appetite.     OBSERVATIONS/OBJECTIVE: Review of Systems  Constitutional:  Positive for malaise/fatigue. Negative for chills, diaphoresis, fever and weight loss.  Respiratory:  Negative for cough and shortness of breath.   Cardiovascular:  Negative for chest pain and palpitations.  Gastrointestinal:  Positive for diarrhea. Negative for abdominal pain, blood in stool, melena, nausea and vomiting.  Neurological:  Positive for weakness. Negative for dizziness and headaches.  Psychiatric/Behavioral:  Positive for hallucinations.     PHYSICAL EXAM (per limitations of virtual telephone visit): The patient is alert and oriented x 1.   ASSESSMENT & PLAN: 1.  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  She stopped of her  own volition in August 2022 due to blisters on her legs and face.  She reports the blisters resolved after stopping Hydrea and have not recurred after she restarted Hydrea.  - Physical examination  (at last appointment) did not reveal any palpable splenomegaly.   - Most recent labs (12/11/2021): Platelets improved at 325 (previously 530); WBC 11.2/ANC 9.3, Hgb 14.1/HCT 44.4.  CMP unremarkable. - PLAN: Continue Hydrea 500 mg twice weekly on Mondays and Thursdays  - We will continue to monitor closely with repeat CBC and office visit in 3 months. - Patient instructed to notify us if she has any thromboembolic events such as DVT, PE, CVA, or MI. - Patient instructed to notify us if she has any side effects BEFORE she makes any changes to the dose of her Hydrea  2.  Atrial fibrillation: - She was previously on Xarelto for stroke prophylaxis, but this was stopped after her hemorrhagic stroke in December 2022  3.  Hemorrhagic stroke - Secondary to fall on 11/08/2021 - She is currently in rehabilitation at [place]: Continue Hydrea 500 mg twice weekly Labs in 3 months Office visit after labs    I discussed the assessment and treatment plan with the patient. The patient was provided an opportunity to ask questions and all were answered. The patient agreed with the plan and demonstrated an understanding of the instructions.   The patient was advised to call back or seek an in-person evaluation if the symptoms worsen or if the condition fails to improve as anticipated.  I provided 23 minutes of non-face-to-face time during this encounter.   Harriett Rush, PA-C 12/17/2021 2:17 PM

## 2021-12-18 ENCOUNTER — Other Ambulatory Visit (HOSPITAL_COMMUNITY): Payer: Self-pay

## 2021-12-18 ENCOUNTER — Ambulatory Visit (HOSPITAL_COMMUNITY)
Admission: RE | Admit: 2021-12-18 | Discharge: 2021-12-18 | Disposition: A | Discharge status: Home / Self Care | Payer: No Typology Code available for payment source | Source: Ambulatory Visit | Attending: Adult Health | Admitting: Adult Health

## 2021-12-18 DIAGNOSIS — R1310 Dysphagia, unspecified: Secondary | ICD-10-CM

## 2021-12-22 ENCOUNTER — Institutional Professional Consult (permissible substitution): Payer: Medicare Other | Admitting: Pulmonary Disease

## 2021-12-22 DIAGNOSIS — R1310 Dysphagia, unspecified: Secondary | ICD-10-CM | POA: Diagnosis not present

## 2021-12-22 DIAGNOSIS — E559 Vitamin D deficiency, unspecified: Secondary | ICD-10-CM | POA: Diagnosis not present

## 2021-12-22 DIAGNOSIS — K219 Gastro-esophageal reflux disease without esophagitis: Secondary | ICD-10-CM | POA: Diagnosis not present

## 2021-12-22 DIAGNOSIS — I69398 Other sequelae of cerebral infarction: Secondary | ICD-10-CM | POA: Diagnosis not present

## 2021-12-22 DIAGNOSIS — G8929 Other chronic pain: Secondary | ICD-10-CM | POA: Diagnosis not present

## 2021-12-22 DIAGNOSIS — K5900 Constipation, unspecified: Secondary | ICD-10-CM | POA: Diagnosis not present

## 2021-12-22 DIAGNOSIS — I10 Essential (primary) hypertension: Secondary | ICD-10-CM | POA: Diagnosis not present

## 2021-12-22 DIAGNOSIS — E785 Hyperlipidemia, unspecified: Secondary | ICD-10-CM | POA: Diagnosis not present

## 2021-12-22 DIAGNOSIS — M6281 Muscle weakness (generalized): Secondary | ICD-10-CM | POA: Diagnosis not present

## 2021-12-22 DIAGNOSIS — R569 Unspecified convulsions: Secondary | ICD-10-CM | POA: Diagnosis not present

## 2021-12-22 DIAGNOSIS — R5381 Other malaise: Secondary | ICD-10-CM | POA: Diagnosis not present

## 2021-12-22 DIAGNOSIS — R278 Other lack of coordination: Secondary | ICD-10-CM | POA: Diagnosis not present

## 2021-12-22 DIAGNOSIS — I480 Paroxysmal atrial fibrillation: Secondary | ICD-10-CM | POA: Diagnosis not present

## 2021-12-22 DIAGNOSIS — M25511 Pain in right shoulder: Secondary | ICD-10-CM | POA: Diagnosis not present

## 2021-12-23 ENCOUNTER — Telehealth (HOSPITAL_COMMUNITY): Payer: Self-pay | Admitting: Physician Assistant

## 2021-12-23 DIAGNOSIS — M6281 Muscle weakness (generalized): Secondary | ICD-10-CM | POA: Diagnosis not present

## 2021-12-23 DIAGNOSIS — I69398 Other sequelae of cerebral infarction: Secondary | ICD-10-CM | POA: Diagnosis not present

## 2021-12-23 DIAGNOSIS — R278 Other lack of coordination: Secondary | ICD-10-CM | POA: Diagnosis not present

## 2021-12-23 DIAGNOSIS — I10 Essential (primary) hypertension: Secondary | ICD-10-CM | POA: Diagnosis not present

## 2021-12-23 NOTE — Telephone Encounter (Signed)
I spoke with the patient's son, Herlinda Heady, regarding his question about the COVID-19 vaccination.  He had wondered if there was any contraindication to receiving COVID-19 vaccination given her underlying essential thrombocytosis.  I reached out to [facility] hematologist (Dr. Lorenso Courier) for further advice, and he reported that [facility] patients can still get COVID-19 vaccine, as long as there hydroxyurea is not causing any neutropenia.  This was relayed to the patient's son, who verbalized understanding.

## 2021-12-26 DIAGNOSIS — I69398 Other sequelae of cerebral infarction: Secondary | ICD-10-CM | POA: Diagnosis not present

## 2021-12-26 DIAGNOSIS — M6281 Muscle weakness (generalized): Secondary | ICD-10-CM | POA: Diagnosis not present

## 2021-12-26 DIAGNOSIS — R278 Other lack of coordination: Secondary | ICD-10-CM | POA: Diagnosis not present

## 2021-12-29 DIAGNOSIS — D519 Vitamin B12 deficiency anemia, unspecified: Secondary | ICD-10-CM | POA: Diagnosis not present

## 2021-12-29 DIAGNOSIS — D72829 Elevated white blood cell count, unspecified: Secondary | ICD-10-CM | POA: Diagnosis not present

## 2021-12-29 DIAGNOSIS — D529 Folate deficiency anemia, unspecified: Secondary | ICD-10-CM | POA: Diagnosis not present

## 2021-12-29 DIAGNOSIS — D649 Anemia, unspecified: Secondary | ICD-10-CM | POA: Diagnosis not present

## 2021-12-30 DIAGNOSIS — S52501D Unspecified fracture of the lower end of right radius, subsequent encounter for closed fracture with routine healing: Secondary | ICD-10-CM | POA: Diagnosis not present

## 2021-12-30 DIAGNOSIS — R278 Other lack of coordination: Secondary | ICD-10-CM | POA: Diagnosis not present

## 2021-12-30 DIAGNOSIS — I69398 Other sequelae of cerebral infarction: Secondary | ICD-10-CM | POA: Diagnosis not present

## 2021-12-30 DIAGNOSIS — M6281 Muscle weakness (generalized): Secondary | ICD-10-CM | POA: Diagnosis not present

## 2022-01-01 DIAGNOSIS — M6281 Muscle weakness (generalized): Secondary | ICD-10-CM | POA: Diagnosis not present

## 2022-01-01 DIAGNOSIS — I69398 Other sequelae of cerebral infarction: Secondary | ICD-10-CM | POA: Diagnosis not present

## 2022-01-01 DIAGNOSIS — R278 Other lack of coordination: Secondary | ICD-10-CM | POA: Diagnosis not present

## 2022-01-02 DIAGNOSIS — I69398 Other sequelae of cerebral infarction: Secondary | ICD-10-CM | POA: Diagnosis not present

## 2022-01-02 DIAGNOSIS — R278 Other lack of coordination: Secondary | ICD-10-CM | POA: Diagnosis not present

## 2022-01-02 DIAGNOSIS — M6281 Muscle weakness (generalized): Secondary | ICD-10-CM | POA: Diagnosis not present

## 2022-01-03 DIAGNOSIS — R278 Other lack of coordination: Secondary | ICD-10-CM | POA: Diagnosis not present

## 2022-01-03 DIAGNOSIS — M6281 Muscle weakness (generalized): Secondary | ICD-10-CM | POA: Diagnosis not present

## 2022-01-03 DIAGNOSIS — I69398 Other sequelae of cerebral infarction: Secondary | ICD-10-CM | POA: Diagnosis not present

## 2022-01-05 ENCOUNTER — Other Ambulatory Visit: Payer: Self-pay | Admitting: Family Medicine

## 2022-01-05 DIAGNOSIS — M6281 Muscle weakness (generalized): Secondary | ICD-10-CM | POA: Diagnosis not present

## 2022-01-05 DIAGNOSIS — I10 Essential (primary) hypertension: Secondary | ICD-10-CM

## 2022-01-05 DIAGNOSIS — R278 Other lack of coordination: Secondary | ICD-10-CM | POA: Diagnosis not present

## 2022-01-05 DIAGNOSIS — I69398 Other sequelae of cerebral infarction: Secondary | ICD-10-CM | POA: Diagnosis not present

## 2022-01-06 DIAGNOSIS — R278 Other lack of coordination: Secondary | ICD-10-CM | POA: Diagnosis not present

## 2022-01-06 DIAGNOSIS — M6281 Muscle weakness (generalized): Secondary | ICD-10-CM | POA: Diagnosis not present

## 2022-01-06 DIAGNOSIS — I69398 Other sequelae of cerebral infarction: Secondary | ICD-10-CM | POA: Diagnosis not present

## 2022-01-07 DIAGNOSIS — G8929 Other chronic pain: Secondary | ICD-10-CM | POA: Diagnosis not present

## 2022-01-07 DIAGNOSIS — E559 Vitamin D deficiency, unspecified: Secondary | ICD-10-CM | POA: Diagnosis not present

## 2022-01-07 DIAGNOSIS — I480 Paroxysmal atrial fibrillation: Secondary | ICD-10-CM | POA: Diagnosis not present

## 2022-01-07 DIAGNOSIS — K219 Gastro-esophageal reflux disease without esophagitis: Secondary | ICD-10-CM | POA: Diagnosis not present

## 2022-01-07 DIAGNOSIS — E785 Hyperlipidemia, unspecified: Secondary | ICD-10-CM | POA: Diagnosis not present

## 2022-01-07 DIAGNOSIS — R29818 Other symptoms and signs involving the nervous system: Secondary | ICD-10-CM | POA: Diagnosis not present

## 2022-01-07 DIAGNOSIS — I10 Essential (primary) hypertension: Secondary | ICD-10-CM | POA: Diagnosis not present

## 2022-01-07 DIAGNOSIS — K5900 Constipation, unspecified: Secondary | ICD-10-CM | POA: Diagnosis not present

## 2022-01-07 DIAGNOSIS — R1310 Dysphagia, unspecified: Secondary | ICD-10-CM | POA: Diagnosis not present

## 2022-01-08 ENCOUNTER — Ambulatory Visit: Payer: Medicare Other | Admitting: Family Medicine

## 2022-01-08 NOTE — Progress Notes (Deleted)
Subjective: CC:*** PCP: [name], DO HPI:Kristy [name] is a 88 y.o. female presenting to clinic today for:  1. ***   ROS: Per HPI  Allergies  Allergen Reactions   Codeine Nausea Only   Latanoprost Other (See Comments)    Red and burning   Tramadol Nausea Only   Past Medical History:  Diagnosis Date   A-fib [facility])    Cataract    Closed left arm fracture 2016   Colon polyps    Diverticulosis    Endometrial hyperplasia without atypia, simple    Endometrial polyp    Fatty liver    GERD (gastroesophageal reflux disease)    Hiatal hernia    Hyperplastic colon polyp    Hypertension    Lipoma    Obesity    Optic neuropathy of both eyes 1989, 1997   SOB (shortness of breath)    Thrombocytosis     Current Outpatient Medications:    Acetaminophen (TYLENOL CHILDRENS CHEWABLES PO), Take 325 mg by mouth every 6 (six) hours as needed., Disp: , Rfl:    atorvastatin (LIPITOR) 20 MG tablet, Take 1 tablet (20 mg total) by mouth daily., Disp: , Rfl:    Cholecalciferol (VITAMIN D3) 2000 UNITS TABS, Take 4,000 Units by mouth daily., Disp: , Rfl:    diclofenac Sodium (VOLTAREN) 1 % GEL, Apply 2 g topically 2 (two) times daily., Disp: , Rfl:    dorzolamide-timolol (COSOPT) 22.3-6.8 MG/ML ophthalmic solution, Place 1 drop into both eyes 2 (two) times daily., Disp: , Rfl:    hydrochlorothiazide (MICROZIDE) 12.5 MG capsule, Take 12.5 mg by mouth daily., Disp: , Rfl:    hydroxyurea (HYDREA) 500 MG capsule, Take 500 mg by mouth as directed. One tablet every Monday and Thursday only, Disp: , Rfl:    levETIRAcetam (KEPPRA) 250 MG tablet, Take 1 tablet (250 mg total) by mouth 2 (two) times daily., Disp: , Rfl:    Lutein 6 MG CAPS, Take 1 capsule by mouth daily., Disp: , Rfl:    meclizine (ANTIVERT) 12.5 MG tablet, Take 1 tablet (12.5 mg total) by mouth as needed. Take as needed for  vertigo/ dizziness (Patient taking differently: Take 12.5 mg by mouth as needed. Take as needed for   vertigo/ dizziness), Disp: 30 tablet, Rfl: 0   melatonin 3 MG TABS tablet, Take 1 tablet (3 mg total) by mouth at bedtime as needed., Disp: , Rfl: 0   metoprolol succinate (TOPROL-XL) 25 MG 24 hr tablet, Take 1 tablet (25 mg total) by mouth daily., Disp: 90 tablet, Rfl: 3   omeprazole (PRILOSEC) 20 MG capsule, TAKE 1 CAPSULE BY MOUTH  DAILY, Disp: 90 capsule, Rfl: 3   Polyethyl Glycol-Propyl Glycol (SYSTANE OP), Apply 1-2 drops to eye 4 (four) times daily as needed (dry eyes)., Disp: , Rfl:    polyethylene glycol (MIRALAX / GLYCOLAX) 17 g packet, Take 17 g by mouth daily as needed for moderate constipation., Disp: 14 each, Rfl: 0   sertraline (ZOLOFT) 50 MG tablet, Take 1 tablet (50 mg total) by mouth daily., Disp: 30 tablet, Rfl: 5 Social History   Socioeconomic History   Marital status: Widowed    Spouse name: Not on file   Number of children: 2   Years of education: Not on file   Highest education level: Not on file  Occupational History   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    Employer: RETIRED    Comment: Retired  Tobacco Use   Smoking status: Former  Types: Cigarettes    Quit date: 12/07/1982    Years since quitting: 39.1   Smokeless tobacco: Never  Vaping Use   Vaping Use: Never used  Substance and Sexual Activity   Alcohol use: No   Drug use: No   Sexual activity: Never  Other Topics Concern   Not on file  Social History Narrative   Son in [place]   Daughter in [place]   Social Determinants of Health   Financial Resource Strain: Low Risk    Difficulty of Paying Living Expenses: Not hard at all  Food Insecurity: No Food Insecurity   Worried About Charity fundraiser in the Last Year: Never true   Arboriculturist in the Last Year: Never true  Transportation Needs: No Transportation Needs   Lack of Transportation (Medical): No   Lack of Transportation (Non-Medical): No  Physical Activity: Insufficiently Active   Days of Exercise per Week: 7 days    Minutes of Exercise per Session: 10 min  Stress: Stress Concern Present   Feeling of Stress : To some extent  Social Connections: Moderately Integrated   Frequency of Communication with Friends and Family: More than three times a week   Frequency of Social Gatherings with Friends and Family: Twice a week   Attends Religious Services: More than 4 times per year   Active Member of [employer] or Organizations: Yes   Attends Archivist Meetings: 1 to 4 times per year   Marital Status: Widowed  Human resources officer Violence: Not At Risk   Fear of Current or Ex-Partner: No   Emotionally Abused: No   Physically Abused: No   Sexually Abused: No   Family History  Problem Relation Age of Onset   Diabetes Mother    Diabetes Sister    Stroke Father    Diabetes Brother    Heart disease Brother    Cancer Sister        lung   Cancer Sister        vulva   Cancer Sister    Dementia Sister    Osteoporosis Sister    Diabetes Sister    Osteoporosis Sister    Diabetes Sister    Cancer Brother        bladder   Colon cancer Neg Hx     Objective: Office vital signs reviewed. There were no vitals taken for this visit.  Physical Examination:  General: Awake, alert, *** nourished, No acute distress HEENT: Normal    Neck: No masses palpated. No lymphadenopathy    Ears: Tympanic membranes intact, normal light reflex, no erythema, no bulging    Eyes: PERRLA, extraocular membranes intact, sclera ***    Nose: nasal turbinates moist, *** nasal discharge    Throat: moist mucus membranes, no erythema, *** tonsillar exudate.  Airway is patent Cardio: regular rate and rhythm, S1S2 heard, no murmurs appreciated Pulm: clear to auscultation bilaterally, no wheezes, rhonchi or rales; normal work of breathing on room air GI: soft, non-tender, non-distended, bowel sounds present x4, no hepatomegaly, no splenomegaly, no masses GU: external vaginal tissue ***, cervix ***, *** punctate lesions on cervix  appreciated, *** discharge from cervical os, *** bleeding, *** cervical motion tenderness, *** abdominal/ adnexal masses Extremities: warm, well perfused, No edema, cyanosis or clubbing; +*** pulses bilaterally MSK: *** gait and *** station Skin: dry; intact; no rashes or lesions Neuro: *** Strength and light touch sensation grossly intact, *** DTRs ***/4  Assessment/ Plan: 88 y.o. female   ***  No orders of the defined types were placed in this encounter.  No orders of the defined types were placed in this encounter.    [name], DO [place] [phone]

## 2022-01-10 DIAGNOSIS — M6281 Muscle weakness (generalized): Secondary | ICD-10-CM | POA: Diagnosis not present

## 2022-01-10 DIAGNOSIS — R278 Other lack of coordination: Secondary | ICD-10-CM | POA: Diagnosis not present

## 2022-01-10 DIAGNOSIS — I69398 Other sequelae of cerebral infarction: Secondary | ICD-10-CM | POA: Diagnosis not present

## 2022-01-11 DIAGNOSIS — M6281 Muscle weakness (generalized): Secondary | ICD-10-CM | POA: Diagnosis not present

## 2022-01-11 DIAGNOSIS — R278 Other lack of coordination: Secondary | ICD-10-CM | POA: Diagnosis not present

## 2022-01-11 DIAGNOSIS — I69398 Other sequelae of cerebral infarction: Secondary | ICD-10-CM | POA: Diagnosis not present

## 2022-01-13 ENCOUNTER — Encounter: Payer: Self-pay | Admitting: Neurology

## 2022-01-13 ENCOUNTER — Ambulatory Visit: Payer: Medicare Other | Admitting: Neurology

## 2022-01-13 VITALS — BP 137/66 | HR 59 | Ht 62.0 in | Wt 130.0 lb

## 2022-01-13 DIAGNOSIS — I611 Nontraumatic intracerebral hemorrhage in hemisphere, cortical: Secondary | ICD-10-CM

## 2022-01-13 DIAGNOSIS — I69398 Other sequelae of cerebral infarction: Secondary | ICD-10-CM | POA: Diagnosis not present

## 2022-01-13 DIAGNOSIS — M6281 Muscle weakness (generalized): Secondary | ICD-10-CM | POA: Diagnosis not present

## 2022-01-13 DIAGNOSIS — G3184 Mild cognitive impairment, so stated: Secondary | ICD-10-CM | POA: Diagnosis not present

## 2022-01-13 DIAGNOSIS — I482 Chronic atrial fibrillation, unspecified: Secondary | ICD-10-CM | POA: Diagnosis not present

## 2022-01-13 DIAGNOSIS — R278 Other lack of coordination: Secondary | ICD-10-CM | POA: Diagnosis not present

## 2022-01-13 DIAGNOSIS — R413 Other amnesia: Secondary | ICD-10-CM

## 2022-01-13 DIAGNOSIS — R7989 Other specified abnormal findings of blood chemistry: Secondary | ICD-10-CM | POA: Diagnosis not present

## 2022-01-13 DIAGNOSIS — E538 Deficiency of other specified B group vitamins: Secondary | ICD-10-CM | POA: Diagnosis not present

## 2022-01-13 DIAGNOSIS — R799 Abnormal finding of blood chemistry, unspecified: Secondary | ICD-10-CM | POA: Diagnosis not present

## 2022-01-13 NOTE — Patient Instructions (Signed)
I had a long discussion with the patient and her son regarding the recent intracerebral hemorrhage and atrial fibrillation and discussed risk benefit of resuming anticoagulation and answered questions.  She is also having some new cognitive impairment poststroke.  Recommend further evaluation by checking dementia panel labs, EEG and MRI scan of the brain to rule out interval new strokes.  Continue ongoing physical occupational therapy.  She may also consider possible participation in the ASPIRE trial (aspirin versus Eliquis after intracerebral hemorrhage and A-fib patient's) if interested and will be given information to review and decide.  We also discussed memory compensation strategies and encouraged her to increase participation in cognitively challenging activities like solving crossword puzzles, playing bridge and sudoku.  Return for follow-up in the future in 3 months or call earlier if necessary. Memory Compensation Strategies  Use "WARM" strategy.  W= write it down  A= associate it  R= repeat it  M= make a mental note  2.   You can keep a Social worker.  Use a 3-ring notebook with sections for the following: calendar, important names and phone numbers,  medications, doctors' names/phone numbers, lists/reminders, and a section to journal what you did  each day.   3.    Use a calendar to write appointments down.  4.    Write yourself a schedule for the day.  This can be placed on the calendar or in a separate section of the Memory Notebook.  Keeping a  regular schedule can help memory.  5.    Use medication organizer with sections for each day or morning/evening pills.  You may need help loading it  6.    Keep a basket, or pegboard by the door.  Place items that you need to take out with you in the basket or on the pegboard.  You may also want to  include a message board for reminders.  7.    Use sticky notes.  Place sticky notes with reminders in a place where the task is performed.   For example: " turn off the  stove" placed by the stove, "lock the door" placed on the door at eye level, " take your medications" on  the bathroom mirror or by the place where you normally take your medications.  8.    Use alarms/timers.  Use while cooking to remind yourself to check on food or as a reminder to take your medicine, or as a  reminder to make a call, or as a reminder to perform another task, etc.

## 2022-01-13 NOTE — Progress Notes (Signed)
[facility] [address] Kristy [name]. [place] [address] [phone]       OFFICE FOLLOW-UP NOTE  [name] Date of Birth:  [birth year] Medical Record Number:  [id number]   HPI: Kristy [name] is a 88 year old Caucasian lady seen today for initial office follow-up visit following hospital admission for intracerebral hemorrhage in December 2022. She has past medical history of chronic A-fib on Xarelto, hypertension, thrombocytosis who presented on 11/09/2021 to the emergency room when she was found on the floor with depressed level of consciousness following a fall in the yard.  When family tried to contact her she did not respond.  She was brought in by private vehicle and CT scan of the head done on admission showed a large right frontoparietal parenchymal hemorrhage with mild cytotoxic edema.  There was obvious facial trauma as well.  No neurosurgical intervention was deemed necessary.  Her anticoagulation was reversed with Andexxa.  She was kept in the ICU and required Cleviprex initially for blood pressure management.  She sustained a fracture of the radial styloid for which conservative management was recommended by orthopedics.  The patient had been started on Keppra during the hospitalization because of abnormal long-term EEG monitoring showing PLEDs but no definite clinical seizure was noted.  Patient's son feels that [place] may be interfering with her cognition and questions whether it needs to be continued.  She has had no further witnessed seizures in the rehab facility.  Patient is now able to ambulate with a walker.  She has had no recent falls or injuries.  She has been off anticoagulation and is currently not even on aspirin.  On Mini-Mental status exam testing today she scored 28 out of 30 and did well ROS:   14 system review of systems is positive for memory loss, confusion, disorientation imbalance, weakness all other systems negative  PMH:  Past Medical History:   Diagnosis Date   A-fib ([facility])    Cataract    Closed left arm fracture 2016   Colon polyps    Diverticulosis    Endometrial hyperplasia without atypia, simple    Endometrial polyp    Fatty liver    GERD (gastroesophageal reflux disease)    Hiatal hernia    Hyperplastic colon polyp    Hypertension    Lipoma    Obesity    Optic neuropathy of both eyes 1989, 1997   SOB (shortness of breath)    Thrombocytosis     Social History:  Social History   Socioeconomic History   Marital status: Widowed    Spouse name: Not on file   Number of children: 2   Years of education: Not on file   Highest education level: Not on file  Occupational History   Occupation: [employer]: RETIRED    Comment: Retired  Tobacco Use   Smoking status: Former    Types: Cigarettes    Quit date: 12/07/1982    Years since quitting: 39.1   Smokeless tobacco: Never  Vaping Use   Vaping Use: Never used  Substance and Sexual Activity   Alcohol use: No   Drug use: No   Sexual activity: Never  Other Topics Concern   Not on file  Social History Narrative   Son in [place]   Daughter in [place] Determinants of Health   Financial Resource Strain: Low Risk    Difficulty of Paying Living Expenses: Not hard at all  Food Insecurity:  No Food Insecurity   Worried About Charity fundraiser in the Last Year: Never true   Ran Out of Food in the Last Year: Never true  Transportation Needs: No Transportation Needs   Lack of Transportation (Medical): No   Lack of Transportation (Non-Medical): No  Physical Activity: Insufficiently Active   Days of Exercise per Week: 7 days   Minutes of Exercise per Session: 10 min  Stress: Stress Concern Present   Feeling of Stress : To some extent  Social Connections: Moderately Integrated   Frequency of Communication with Friends and Family: More than three times a week   Frequency of Social Gatherings with Friends and Family:  Twice a week   Attends Religious Services: More than 4 times per year   Active Member of [employer] or Organizations: Yes   Attends Archivist Meetings: 1 to 4 times per year   Marital Status: Widowed  Human resources officer Violence: Not At Risk   Fear of Current or Ex-Partner: No   Emotionally Abused: No   Physically Abused: No   Sexually Abused: No    Medications:   Current Outpatient Medications on File Prior to Visit  Medication Sig Dispense Refill   Acetaminophen (TYLENOL CHILDRENS CHEWABLES PO) Take 325 mg by mouth every 6 (six) hours as needed.     atorvastatin (LIPITOR) 20 MG tablet Take 1 tablet (20 mg total) by mouth daily.     bethanechol (URECHOLINE) 5 MG tablet      Cholecalciferol (VITAMIN D3) 2000 UNITS TABS Take 4,000 Units by mouth daily.     diclofenac Sodium (VOLTAREN) 1 % GEL Apply 2 g topically 2 (two) times daily.     dorzolamide-timolol (COSOPT) 22.3-6.8 MG/ML ophthalmic solution Place 1 drop into both eyes 2 (two) times daily.     hydrALAZINE (APRESOLINE) 20 MG/ML injection      hydrochlorothiazide (MICROZIDE) 12.5 MG capsule Take 12.5 mg by mouth daily.     hydroxyurea (HYDREA) 500 MG capsule Take 500 mg by mouth as directed. One tablet every Monday and Thursday only     levETIRAcetam (KEPPRA) 250 MG tablet Take 1 tablet (250 mg total) by mouth 2 (two) times daily.     Lutein 6 MG CAPS Take 1 capsule by mouth daily.     meclizine (ANTIVERT) 12.5 MG tablet Take 1 tablet (12.5 mg total) by mouth as needed. Take as needed for  vertigo/ dizziness (Patient taking differently: Take 12.5 mg by mouth as needed. Take as needed for  vertigo/ dizziness) 30 tablet 0   metoprolol succinate (TOPROL-XL) 25 MG 24 hr tablet Take 1 tablet (25 mg total) by mouth daily. 90 tablet 3   omeprazole (PRILOSEC) 20 MG capsule TAKE 1 CAPSULE BY MOUTH  DAILY 90 capsule 3   polyethylene glycol (MIRALAX / GLYCOLAX) 17 g packet Take 17 g by mouth daily as needed for moderate constipation. 14  each 0   sertraline (ZOLOFT) 100 MG tablet Take 100 mg by mouth daily.     SYSTANE PRESERVATIVE FREE 0.4-0.3 % SOLN Apply to eye.     XARELTO 15 MG TABS tablet Take 15 mg by mouth daily.     No current facility-administered medications on file prior to visit.    Allergies:   Allergies  Allergen Reactions   Codeine Nausea Only   Latanoprost Other (See Comments)    Red and burning   Tramadol Nausea Only    Physical Exam General: Frail elderly pleasant Caucasian lady, seated, in no  evident distress Head: head normocephalic and atraumatic.  Neck: supple with no carotid or supraclavicular bruits Cardiovascular: regular rate and rhythm, no murmurs Musculoskeletal: Severe kyphoscoliosis.  y Skin:  no rash/petichiae Vascular:  Normal pulses all extremities Vitals:   01/13/22 1359  BP: 137/66  Pulse: (!) 59   Neurologic Exam Mental Status: Awake and fully alert. Oriented to place and time. Recent and remote memory intact. Attention span, concentration and fund of knowledge appropriate. Mood and affect appropriate.  Intact recall 3/3.  Clock drawing 3/4.  Able to name 11 animals which can walk on 4 legs. Cranial Nerves: Fundoscopic exam reveals sharp disc margins. Pupils equal, briskly reactive to light. Extraocular movements full without nystagmus. Visual fields full to confrontation. Hearing diminished bilaterally. Facial sensation intact. Face, tongue, palate moves normally and symmetrically.  Motor: Normal bulk and tone. Normal strength in all tested extremity muscles. Sensory.: intact to touch ,pinprick .position and vibratory sensation.  Coordination: Rapid alternating movements normal in all extremities. Finger-to-nose and heel-to-shin performed accurately bilaterally. Gait and Station: Arises from chair with slight difficulty. Stance is stooped gait demonstrates slight broad-based and imbalance. Reflexes: 1+ and symmetric. Toes downgoing.   NIHSS  1 Modified Rankin  2 MMSE -  Mini Mental State Exam 01/13/2022 11/24/2019 11/24/2019  Orientation to time 4 5 5   Orientation to Place 5 5 5   Registration 3 3 3   Attention/ Calculation 4 5 -  Recall 3 3 -  Language- name 2 objects 2 2 -  Language- repeat 1 1 -  Language- follow 3 step command 3 3 -  Language- read & follow direction 1 1 -  Write a sentence 1 1 -  Copy design 1 1 -  Total score 28 30 -      ASSESSMENT: 88 year old lady with right frontoparietal intraparenchymal hemorrhage likely related to anticoagulation with Xarelto for A-fib in December 2022.  She is made a excellent recovery but remains at recurrent risk for strokes due to her A-fib.  She has mild cognitive impairment     PLAN: I had a long discussion with the patient and her son regarding the recent intracerebral hemorrhage and atrial fibrillation and discussed risk benefit of resuming anticoagulation and answered questions.  She is also having some new cognitive impairment poststroke.  Recommend further evaluation by checking dementia panel labs, EEG and MRI scan of the brain to rule out interval new strokes.  Continue ongoing physical occupational therapy.  She may also consider possible participation in the ASPIRE trial (aspirin versus Eliquis after intracerebral hemorrhage and A-fib patient's) if interested and will be given information to review and decide.  We also discussed memory compensation strategies and encouraged her to increase participation in cognitively challenging activities like solving crossword puzzles, playing bridge and sudoku.  Return for follow-up in the future in 3 months or call earlier if necessary. Greater than 50% of time during this 40 minute visit was spent on counseling,explanation of diagnosis, planning of further management, discussion with patient and family and coordination of care Antony Contras  Note: This document was prepared with digital dictation and possible smart Company secretary. Any transcriptional errors  that result from this process are unintentional

## 2022-01-14 ENCOUNTER — Telehealth: Payer: Self-pay | Admitting: Neurology

## 2022-01-14 NOTE — Telephone Encounter (Signed)
[employer] medicare order sent to [facility], [facility] they will reach out to the patient to schedule.  

## 2022-01-14 NOTE — Progress Notes (Signed)
Your Vitamin B 12, TSH and RPR was normal. No further action needed for these tests. Dr. Leonie Man will follow up when he receives the Homocysteine level.   Dr. April Manson

## 2022-01-15 ENCOUNTER — Telehealth: Payer: Self-pay

## 2022-01-15 LAB — DEMENTIA PANEL
Homocysteine: 18.2 umol/L (ref 0.0–21.3)
RPR Ser Ql: NONREACTIVE
TSH: 3.16 u[IU]/mL (ref 0.450–4.500)
Vitamin B-12: 616 pg/mL (ref 232–1245)

## 2022-01-15 NOTE — Telephone Encounter (Signed)
Transition Care Management Follow-up Telephone Call Date of discharge and from where: [facility] 01-14-22- Dx: CVA  How have you been since you were released from the hospital? Feeling ok  Any questions or concerns? No  Items Reviewed: Did the pt receive and understand the discharge instructions provided? Yes  Medications obtained and verified? Yes  Other? No  Any new allergies since your discharge? No  Dietary orders reviewed? Yes Do you have support at home? Yes   Home Care and Equipment/Supplies: Were home health services ordered? Yes PT If so, what is the name of the agency? [facility]   Has the agency set up a time to come to the patient's home? yes Were any new equipment or medical supplies ordered?  No What is the name of the medical supply agency? [facility] Were you able to get the supplies/equipment? not applicable Do you have any questions related to the use of the equipment or supplies? No  Functional Questionnaire: (I = Independent and D = Dependent) ADLs: D  Bathing/Dressing- D  Meal Prep- D  Eating- I  Maintaining continence- I  Transferring/Ambulation- D  Managing Meds- D  Follow up appointments reviewed:  PCP Hospital f/u appt confirmed? No- nothing available on schedule- office needs to call pt Son Shanon Brow to schedule [facility]? No . Are transportation arrangements needed? No  If their condition worsens, is the pt aware to call PCP or go to the Emergency Dept.? Yes Was the patient provided with contact information for the PCP's office or ED? Yes Was to pt encouraged to call back with questions or concerns? Yes

## 2022-01-16 ENCOUNTER — Encounter: Payer: Self-pay | Admitting: Cardiology

## 2022-01-16 DIAGNOSIS — Z9181 History of falling: Secondary | ICD-10-CM | POA: Diagnosis not present

## 2022-01-16 DIAGNOSIS — I69398 Other sequelae of cerebral infarction: Secondary | ICD-10-CM | POA: Diagnosis not present

## 2022-01-16 DIAGNOSIS — S52514D Nondisplaced fracture of right radial styloid process, subsequent encounter for closed fracture with routine healing: Secondary | ICD-10-CM | POA: Diagnosis not present

## 2022-01-16 DIAGNOSIS — M6281 Muscle weakness (generalized): Secondary | ICD-10-CM | POA: Diagnosis not present

## 2022-01-19 ENCOUNTER — Ambulatory Visit (INDEPENDENT_AMBULATORY_CARE_PROVIDER_SITE_OTHER): Payer: Medicare Other | Admitting: Family Medicine

## 2022-01-19 ENCOUNTER — Other Ambulatory Visit: Payer: Self-pay

## 2022-01-19 ENCOUNTER — Ambulatory Visit (INDEPENDENT_AMBULATORY_CARE_PROVIDER_SITE_OTHER): Payer: Medicare Other | Admitting: Pulmonary Disease

## 2022-01-19 ENCOUNTER — Encounter: Payer: Self-pay | Admitting: Family Medicine

## 2022-01-19 ENCOUNTER — Encounter: Payer: Self-pay | Admitting: Pulmonary Disease

## 2022-01-19 VITALS — BP 122/74 | HR 78 | Temp 98.2°F | Ht 62.0 in | Wt 130.0 lb

## 2022-01-19 VITALS — BP 122/74 | HR 75 | Temp 97.7°F | Ht 62.0 in | Wt 130.0 lb

## 2022-01-19 DIAGNOSIS — Z8673 Personal history of transient ischemic attack (TIA), and cerebral infarction without residual deficits: Secondary | ICD-10-CM | POA: Diagnosis not present

## 2022-01-19 DIAGNOSIS — Z09 Encounter for follow-up examination after completed treatment for conditions other than malignant neoplasm: Secondary | ICD-10-CM | POA: Diagnosis not present

## 2022-01-19 DIAGNOSIS — G4734 Idiopathic sleep related nonobstructive alveolar hypoventilation: Secondary | ICD-10-CM

## 2022-01-19 DIAGNOSIS — S0634AD Traumatic hemorrhage of right cerebrum with loss of consciousness status unknown, subsequent encounter: Secondary | ICD-10-CM

## 2022-01-19 DIAGNOSIS — S0634AS Traumatic hemorrhage of right cerebrum with loss of consciousness status unknown, sequela: Secondary | ICD-10-CM

## 2022-01-19 DIAGNOSIS — R0683 Snoring: Secondary | ICD-10-CM | POA: Diagnosis not present

## 2022-01-19 DIAGNOSIS — S62102D Fracture of unspecified carpal bone, left wrist, subsequent encounter for fracture with routine healing: Secondary | ICD-10-CM

## 2022-01-19 MED ORDER — CULTURELLE ADULT ULT BALANCE PO CAPS: 1.0000 | ORAL_CAPSULE | Freq: Every day | ORAL | 3 refills | Status: AC

## 2022-01-19 NOTE — Patient Instructions (Signed)
From Dr Percival Spanish:  I think that the trial is a great idea of she qualifies because we really don't know the answer and ASA might be just fine.  In the absence of being in the trial I would start Eliquis.  Although the dose that she should be on is technically 5 mg bid I would intuitively go to 2.5 mg given the recent bleed.  Tell her I send my thoughts and I am sorry she had this complication.

## 2022-01-19 NOTE — Progress Notes (Signed)
Subjective: CC: Hospital/SNF discharge follow-up PCP: [name], DO HPI:Kristy [name] is a 88 y.o. female presenting to clinic today for:  1.  History of intracranial hemorrhage/ CVA Patient was admitted after an unwitnessed fall.  She had a fracture of her right wrist and noted to have a intracranial hemorrhage.  She was diagnosed with a CVA.  She was taken off her Xarelto and her son has reached out to her cardiologist, Dr. Percival Spanish, for recommendations on Eliquis versus aspirin versus nothing at all.  She is currently at [facility] in [place].  Her son is down from [place] taking care of her but the plan is to ultimately transition her over to [place] so that she may reside with him in the next several weeks.  She admits that she does not want to go to [place] because she identifies [place] as her home.  She has been doing okay since discharge from the hospital but does admit that she fell in January which required ER evaluation.  She had a swallow study done and per that report she had minimal residual fluids in the esophagus after swallow study.  It was deferred to her facility to determine diet.  Currently drinking thickened fluids which she does not like and would like to discontinue.  She has a sleep study today as well as an EKG and MRI scheduled.  ROS: Per HPI  Allergies  Allergen Reactions   Codeine Nausea Only   Latanoprost Other (See Comments)    Red and burning   Tramadol Nausea Only   Past Medical History:  Diagnosis Date   A-fib [facility])    Cataract    Closed left arm fracture 2016   Colon polyps    Diverticulosis    Endometrial hyperplasia without atypia, simple    Endometrial polyp    Fatty liver    GERD (gastroesophageal reflux disease)    Hiatal hernia    Hyperplastic colon polyp    Hypertension    Lipoma    Obesity    Optic neuropathy of both eyes 1989, 1997   SOB (shortness of breath)    Thrombocytosis     Current Outpatient Medications:     Acetaminophen (TYLENOL CHILDRENS CHEWABLES PO), Take 325 mg by mouth every 6 (six) hours as needed., Disp: , Rfl:    atorvastatin (LIPITOR) 20 MG tablet, Take 1 tablet (20 mg total) by mouth daily., Disp: , Rfl:    bethanechol (URECHOLINE) 5 MG tablet, , Disp: , Rfl:    Cholecalciferol (VITAMIN D3) 2000 UNITS TABS, Take 4,000 Units by mouth daily., Disp: , Rfl:    diclofenac Sodium (VOLTAREN) 1 % GEL, Apply 2 g topically 2 (two) times daily., Disp: , Rfl:    dorzolamide-timolol (COSOPT) 22.3-6.8 MG/ML ophthalmic solution, Place 1 drop into both eyes 2 (two) times daily., Disp: , Rfl:    hydrALAZINE (APRESOLINE) 20 MG/ML injection, , Disp: , Rfl:    hydrochlorothiazide (MICROZIDE) 12.5 MG capsule, Take 12.5 mg by mouth daily., Disp: , Rfl:    hydroxyurea (HYDREA) 500 MG capsule, Take 500 mg by mouth as directed. One tablet every Monday and Thursday only, Disp: , Rfl:    levETIRAcetam (KEPPRA) 250 MG tablet, Take 1 tablet (250 mg total) by mouth 2 (two) times daily., Disp: , Rfl:    Lutein 6 MG CAPS, Take 1 capsule by mouth daily., Disp: , Rfl:    meclizine (ANTIVERT) 12.5 MG tablet, Take 1 tablet (12.5 mg total) by mouth  as needed. Take as needed for  vertigo/ dizziness (Patient taking differently: Take 12.5 mg by mouth as needed. Take as needed for  vertigo/ dizziness), Disp: 30 tablet, Rfl: 0   metoprolol succinate (TOPROL-XL) 25 MG 24 hr tablet, Take 1 tablet (25 mg total) by mouth daily., Disp: 90 tablet, Rfl: 3   omeprazole (PRILOSEC) 20 MG capsule, TAKE 1 CAPSULE BY MOUTH  DAILY, Disp: 90 capsule, Rfl: 3   polyethylene glycol (MIRALAX / GLYCOLAX) 17 g packet, Take 17 g by mouth daily as needed for moderate constipation., Disp: 14 each, Rfl: 0   polyethylene glycol powder (GLYCOLAX/MIRALAX) 17 GM/SCOOP powder, Take 17 g by mouth daily., Disp: , Rfl:    sertraline (ZOLOFT) 100 MG tablet, Take 100 mg by mouth daily., Disp: , Rfl:    SYSTANE PRESERVATIVE FREE 0.4-0.3 % SOLN, Apply to eye., Disp: ,  Rfl:    XARELTO 15 MG TABS tablet, Take 15 mg by mouth daily. 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    Employer: RETIRED    Comment: Retired  Tobacco Use   Smoking status: Former    Types: Cigarettes    Quit date: 12/07/1982    Years since quitting: 39.1   Smokeless tobacco: Never  Vaping Use   Vaping Use: Never used  Substance and Sexual Activity   Alcohol use: No   Drug use: No   Sexual activity: Never  Other Topics Concern   Not on file  Social History Narrative   Son in [place]   Daughter in [place] Strain: Low Risk    Difficulty of Paying Living Expenses: Not hard at all  Food Insecurity: No Food Insecurity   Worried About Charity fundraiser in the Last Year: Never true   Arboriculturist in the Last Year: Never true  Transportation Needs: No Transportation Needs   Lack of Transportation (Medical): No   Lack of Transportation (Non-Medical): No  Physical Activity: Insufficiently Active   Days of Exercise per Week: 7 days   Minutes of Exercise per Session: 10 min  Stress: Stress Concern Present   Feeling of Stress : To some extent  Social Connections: Moderately Integrated   Frequency of Communication with Friends and Family: More than three times a week   Frequency of Social Gatherings with Friends and Family: Twice a week   Attends Religious Services: More than 4 times per year   Active Member of [employer] or Organizations: Yes   Attends Archivist Meetings: 1 to 4 times per year   Marital Status: Widowed  Human resources officer Violence: Not At Risk   Fear of Current or Ex-Partner: No   Emotionally Abused: No   Physically  Abused: No   Sexually Abused: No   Family History  Problem Relation Age of Onset   Diabetes Mother    Diabetes Sister    Stroke Father    Diabetes Brother    Heart disease Brother    Cancer Sister        lung   Cancer Sister        vulva   Cancer Sister  Dementia Sister    Osteoporosis Sister    Diabetes Sister    Osteoporosis Sister    Diabetes Sister    Cancer Brother        bladder   Colon cancer Neg Hx     Objective: Office vital signs reviewed. BP 122/74    Pulse 78    Temp 98.2 F (36.8 C)    Ht 5\' 2"  (1.575 m)    Wt 130 lb (59 kg)    SpO2 94%    BMI 23.78 kg/m   Physical Examination:  General: Awake, alert, well nourished, No acute distress HEENT: Drooping of the right eyelid.  Sclera white Cardio: Irregularly irregular with rate control, S1S2 heard, no murmurs appreciated Pulm: clear to auscultation bilaterally, no wheezes, rhonchi or rales; normal work of breathing on room air MSK: Arrives in wheelchair  Assessment/ Plan: 88 y.o. female   Traumatic hemorrhage of right cerebrum with unknown loss of consciousness status, sequela  History of cerebrovascular accident (CVA) in adulthood  Closed fracture dislocation of left wrist with routine healing, subsequent encounter  Hospital discharge follow-up  I reviewed her hospital notes, ER notes.  I agree that some type of assistance with her living situation is an excellent idea and very pleased to know that her son will be able to take care of her in [place].  I discussed with the patient I do think it is instrumental for her to have some extra care, particular given repeat falls and potentially being placed back on anticoagulation.  I was able to give the information from Dr. Percival Spanish that was in her EMR stating that Eliquis 2.5 mg twice daily would be recommended if she was not to be enrolled in this medical trial that is looking at aspirin versus Eliquis.  Her son will reach back out to him once they have an idea  as to whether or not she will be participating in the study.  I reviewed her swallow study with her son and I do not see a true barrier to her resuming normal diet assuming that she is not having any abrupt worsening of her swallowing.  We will plan to write a statement that her son can help with medication administration if needed.  I have asked that he ask the facility to send me their standing orders and I will be glad to check off what is appropriate over-the-counter for this patient should she need it.  In the meantime, lets plan for reconvening in about 4 to 6 weeks before she heads off to [place] for a repeat exam.  Hopefully, at that time we can make a more concise/ complete summary of her current state of health in preparation for her new PCP in [place]  No orders of the defined types were placed in this encounter.  No orders of the defined types were placed in this encounter.   [name], DO [place] [phone]

## 2022-01-19 NOTE — Patient Instructions (Signed)
Will arrange for overnight oxygen test and call you with results

## 2022-01-19 NOTE — Progress Notes (Signed)
[facility]  Chief Complaint  Patient presents with   Sleep Consult    Referred by Ronnie Doss, DO.  Pt's son reports pt has been told her o2 levels drop too low.     Past Surgical History:  She  has a past surgical history that includes Cholecystectomy (02/2991); umbilical hernia repair (09/2002); Knee surgery; optic neuritis; Dilation and curettage of uterus; Cataract extraction; breast biopsy; Hernia repair; and Eye surgery.  Past Medical History:  A fib, Cataract, Colon polyps, Diverticulosis, Fatty liver, GERD, Hiatal hernia, HTN, ICH with IVH December 2022, Thrombocytosis  Constitutional:  BP 122/74 (BP Location: Left Arm, Cuff Size: Normal)    Pulse 75    Temp 97.7 F (36.5 C) (Oral)    Ht 5\' 2"  (1.575 m)    Wt 130 lb (59 kg)    SpO2 95% Comment: on RA   BMI 23.78 kg/m   Brief Summary:  Kristy [name] is a 88 y.o. female with snoring and low oxygen level at night.      Subjective:   She is here with her son.  She fell at home and suffered an Crisp with IVH.  She improved and eventually transitioned to SNF.  Her son is planning to have her relocate in the next two months to [place] to be closer to him.  While in the hospital she was noted to have low oxygen at night.  She hasn't needed supplemental oxygen since she has been in SNF.  She snores.  She feels like her sleep is okay otherwise.  She wakes up 1 time to use the bathroom.  She naps for an hour in the afternoon.  She goes to bed at 8 pm and wakes up at 8 am.  She isn't using anything to help her sleep.    Physical Exam:   Appearance - well kempt   ENMT - no sinus tenderness, no oral exudate, no LAN, Mallampati 3 airway, no stridor  Respiratory - equal breath sounds bilaterally, no wheezing or rales  CV - s1s2 regular rate and rhythm, no murmurs  Ext - no clubbing, no edema  Skin - no rashes  Psych - normal mood and affect   Sleep Tests:    Cardiac  Tests:  Echo 02/04/16 >> EF 55 to 70%, mild AR, mod TR  Social History:  She  reports that she quit smoking about 39 years ago. Her smoking use included cigarettes. She has never used smokeless tobacco. She reports that she does not drink alcohol and does not use drugs.  Family History:  Her family history includes Cancer in her brother, sister, sister, and sister; Dementia in her sister; Diabetes in her brother, mother, sister, sister, and sister; Heart disease in her brother; Osteoporosis in her sister and sister; Stroke in her father.    Discussion:  She has snoring, and has history of atrial fibrillation and hypertension.  She was noted to have low oxygen when asleep while in hospital, but this was in the setting of a significant ICH with IVH.  She hasn't needed supplemental oxygen more recently.  Her son is planning to have her relocate to [place] in the next several weeks.    Assessment/Plan:   Snoring with nocturnal hypoxemia while she was in the hospital. - will arrange for overnight oximetry on room air - if this is unremarkable, then she should be able to defer additional sleep testing - if she  does have significant findings on overnight oximetry, then she will plan to arrange for further assessment after relocating to [place]  Traumatic intracerebral hemorrhage from December 2022. - followed by Dr. Antony Contras with neurology  Atrial fibrillation. - followed by Dr. Minus Breeding with cardiology  Time Spent Involved in Patient Care on Day of Examination:  37 minutes  Follow up:   Patient Instructions  Will arrange for overnight oxygen test and call you with results  Medication List:   Allergies as of 01/19/2022       Reactions   Codeine Nausea Only   Latanoprost Other (See Comments)   Red and burning   Tramadol Nausea Only        Medication List        Accurate as of January 19, 2022  2:45 PM. If you have any questions, ask your nurse or doctor.           STOP taking these medications    Xarelto 15 MG Tabs tablet Generic drug: Rivaroxaban Stopped by: Ronnie Doss, DO       TAKE these medications    atorvastatin 20 MG tablet Commonly known as: LIPITOR Take 1 tablet (20 mg total) by mouth daily.   bethanechol 5 MG tablet Commonly known as: URECHOLINE   Culturelle Adult Ult Balance Caps Take 1 capsule by mouth daily. For digestive health Started by: Ronnie Doss, DO   diclofenac Sodium 1 % Gel Commonly known as: VOLTAREN Apply 2 g topically 2 (two) times daily.   dorzolamide-timolol 22.3-6.8 MG/ML ophthalmic solution Commonly known as: COSOPT Place 1 drop into both eyes 2 (two) times daily.   hydrALAZINE 20 MG/ML injection Commonly known as: APRESOLINE   hydrochlorothiazide 12.5 MG capsule Commonly known as: MICROZIDE Take 12.5 mg by mouth daily.   hydroxyurea 500 MG capsule Commonly known as: HYDREA Take 500 mg by mouth as directed. One tablet every Monday and Thursday only   levETIRAcetam 250 MG tablet Commonly known as: KEPPRA Take 1 tablet (250 mg total) by mouth 2 (two) times daily.   Lutein 6 MG Caps Take 1 capsule by mouth daily.   meclizine 12.5 MG tablet Commonly known as: ANTIVERT Take 1 tablet (12.5 mg total) by mouth as needed. Take as needed for  vertigo/ dizziness   metoprolol succinate 25 MG 24 hr tablet Commonly known as: TOPROL-XL Take 1 tablet (25 mg total) by mouth daily.   omeprazole 20 MG capsule Commonly known as: PRILOSEC TAKE 1 CAPSULE BY MOUTH  DAILY   polyethylene glycol 17 g packet Commonly known as: MIRALAX / GLYCOLAX Take 17 g by mouth daily as needed for moderate constipation.   polyethylene glycol powder 17 GM/SCOOP powder Commonly known as: GLYCOLAX/MIRALAX Take 17 g by mouth daily.   sertraline 100 MG tablet Commonly known as: ZOLOFT Take 100 mg by mouth daily.   Systane Preservative Free 0.4-0.3 % Soln Generic drug: Polyethyl Glyc-Propyl Glyc  PF Apply to eye.   TYLENOL CHILDRENS CHEWABLES PO Take 325 mg by mouth every 6 (six) hours as needed.   Vitamin D3 50 MCG (2000 UT) Tabs Take 4,000 Units by mouth daily.        Signature:  Chesley Mires, MD [place] Pager - [phone] 01/19/2022, 2:45 PM

## 2022-01-20 ENCOUNTER — Other Ambulatory Visit: Payer: Self-pay

## 2022-01-20 ENCOUNTER — Telehealth: Payer: Self-pay | Admitting: Family Medicine

## 2022-01-20 ENCOUNTER — Other Ambulatory Visit: Payer: Self-pay | Admitting: *Deleted

## 2022-01-20 ENCOUNTER — Other Ambulatory Visit: Payer: Self-pay | Admitting: Family Medicine

## 2022-01-20 DIAGNOSIS — J069 Acute upper respiratory infection, unspecified: Secondary | ICD-10-CM

## 2022-01-20 DIAGNOSIS — R059 Cough, unspecified: Secondary | ICD-10-CM | POA: Diagnosis not present

## 2022-01-20 DIAGNOSIS — R110 Nausea: Secondary | ICD-10-CM

## 2022-01-20 LAB — VERITOR FLU A/B WAIVED
Influenza A: NEGATIVE
Influenza B: NEGATIVE

## 2022-01-20 LAB — RAPID STREP SCREEN (MED CTR MEBANE ONLY): Strep Gp A Ag, IA W/Reflex: NEGATIVE

## 2022-01-20 LAB — CULTURE, GROUP A STREP

## 2022-01-20 MED ORDER — BENZONATATE 100 MG PO CAPS
100.0000 mg | ORAL_CAPSULE | Freq: Three times a day (TID) | ORAL | 0 refills | Status: DC | PRN
Start: 2022-01-20 — End: 2022-01-21

## 2022-01-20 MED ORDER — ONDANSETRON 4 MG PO TBDP: 4.0000 mg | ORAL_TABLET | Freq: Three times a day (TID) | ORAL | 0 refills | Status: DC | PRN

## 2022-01-20 NOTE — Telephone Encounter (Signed)
Okay for him to bring her up for drive up testing for influenza, strep and COVID.  I am glad to symptomatically treat her but want to make sure that she is not dealing with something that needs an antiviral or antibiotic

## 2022-01-20 NOTE — Telephone Encounter (Signed)
Patient was seen in the office on 2/13, she is now having congestion, nausea and cough. Wants to know if something can be called in. Son can bring her if she needs appt. Please call back and advise.

## 2022-01-20 NOTE — Telephone Encounter (Signed)
Pt son daughter

## 2022-01-21 ENCOUNTER — Other Ambulatory Visit: Payer: Self-pay | Admitting: Family Medicine

## 2022-01-21 ENCOUNTER — Telehealth: Payer: Self-pay | Admitting: Family Medicine

## 2022-01-21 DIAGNOSIS — U071 COVID-19: Secondary | ICD-10-CM

## 2022-01-21 DIAGNOSIS — J069 Acute upper respiratory infection, unspecified: Secondary | ICD-10-CM

## 2022-01-21 DIAGNOSIS — R110 Nausea: Secondary | ICD-10-CM

## 2022-01-21 LAB — NOVEL CORONAVIRUS, NAA: SARS-CoV-2, NAA: DETECTED — AB

## 2022-01-21 MED ORDER — MOLNUPIRAVIR EUA 200MG CAPSULE: 4.0000 | ORAL_CAPSULE | Freq: Two times a day (BID) | ORAL | 0 refills | Status: DC

## 2022-01-21 MED ORDER — BENZONATATE 100 MG PO CAPS: 100.0000 mg | ORAL_CAPSULE | Freq: Three times a day (TID) | ORAL | 0 refills | Status: DC | PRN

## 2022-01-21 MED ORDER — ONDANSETRON 4 MG PO TBDP: 4.0000 mg | ORAL_TABLET | Freq: Three times a day (TID) | ORAL | 0 refills | Status: DC | PRN

## 2022-01-21 MED ORDER — MOLNUPIRAVIR EUA 200MG CAPSULE: 4.0000 | ORAL_CAPSULE | Freq: Two times a day (BID) | ORAL | 0 refills | Status: AC

## 2022-01-21 NOTE — Telephone Encounter (Signed)
Pharmacy aware and verbalizes understanding.

## 2022-01-21 NOTE — Addendum Note (Signed)
Addended by: [name] on: 01/21/2022 02:33 PM   Modules accepted: Orders

## 2022-01-21 NOTE — Telephone Encounter (Signed)
Please review and advise.

## 2022-01-21 NOTE — Telephone Encounter (Signed)
This is an eye supplement.  It is safe in doses up to 20mg .  Ok to give 20mg 

## 2022-01-21 NOTE — Progress Notes (Signed)
Called medicine pharmacy to back out the [place], [place] and Molnupiravir.  Manually faxed Rx's to her assisted living facility, who will administer her medications.  We discussed return precautions and reasons for emergent evaluation in the ER.  Meds ordered this encounter  Medications   DISCONTD: molnupiravir EUA (LAGEVRIO) 200 mg CAPS capsule    Sig: Take 4 capsules (800 mg total) by mouth 2 (two) times daily for 5 days.    Dispense:  40 capsule    Refill:  0    Dx 01/20/21, >65, multiple co morbidities   molnupiravir EUA (LAGEVRIO) 200 mg CAPS capsule    Sig: Take 4 capsules (800 mg total) by mouth 2 (two) times daily for 5 days. For COVID- 19 infection    Dispense:  40 capsule    Refill:  0    Dx 01/20/21, >65, multiple co morbidities   benzonatate (TESSALON PERLES) 100 MG capsule    Sig: Take 1 capsule (100 mg total) by mouth 3 (three) times daily as needed for cough.    Dispense:  20 capsule    Refill:  0   ondansetron (ZOFRAN-ODT) 4 MG disintegrating tablet    Sig: Take 1 tablet (4 mg total) by mouth every 8 (eight) hours as needed for nausea or vomiting.    Dispense:  20 tablet    Refill:  0

## 2022-01-21 NOTE — Telephone Encounter (Signed)
Kristy [name] calling [facility] only carries 20 mg of Lutein rx. It cannot be split because it comes in a capsule. On file Pt takes Lutein 6 MG CAPS. Please call back

## 2022-01-21 NOTE — Telephone Encounter (Signed)
Covid meds have been faxed

## 2022-01-22 ENCOUNTER — Other Ambulatory Visit: Payer: Medicare Other | Admitting: *Deleted

## 2022-01-24 ENCOUNTER — Encounter: Payer: Self-pay | Admitting: Neurology

## 2022-01-26 ENCOUNTER — Inpatient Hospital Stay: Payer: Medicare Other | Admitting: Neurology

## 2022-01-31 ENCOUNTER — Ambulatory Visit
Admission: RE | Admit: 2022-01-31 | Discharge: 2022-01-31 | Disposition: A | Discharge status: Home / Self Care | Payer: Medicare Other | Source: Ambulatory Visit | Attending: Neurology | Admitting: Neurology

## 2022-01-31 ENCOUNTER — Other Ambulatory Visit: Payer: Self-pay

## 2022-01-31 DIAGNOSIS — Z8673 Personal history of transient ischemic attack (TIA), and cerebral infarction without residual deficits: Secondary | ICD-10-CM | POA: Diagnosis not present

## 2022-01-31 DIAGNOSIS — G936 Cerebral edema: Secondary | ICD-10-CM | POA: Diagnosis not present

## 2022-01-31 DIAGNOSIS — I619 Nontraumatic intracerebral hemorrhage, unspecified: Secondary | ICD-10-CM | POA: Diagnosis not present

## 2022-02-02 ENCOUNTER — Encounter: Payer: Self-pay | Admitting: *Deleted

## 2022-02-04 DIAGNOSIS — S52501D Unspecified fracture of the lower end of right radius, subsequent encounter for closed fracture with routine healing: Secondary | ICD-10-CM | POA: Diagnosis not present

## 2022-02-05 ENCOUNTER — Encounter: Payer: Self-pay | Admitting: Family Medicine

## 2022-02-05 NOTE — Telephone Encounter (Signed)
It's hard to give a percent, but per the report the swelling is almost completely gone. The blood will take longer to resolve but should improve over time.

## 2022-02-05 NOTE — Telephone Encounter (Signed)
Can you please help

## 2022-02-13 DIAGNOSIS — S52514D Nondisplaced fracture of right radial styloid process, subsequent encounter for closed fracture with routine healing: Secondary | ICD-10-CM | POA: Diagnosis not present

## 2022-02-13 DIAGNOSIS — Z9181 History of falling: Secondary | ICD-10-CM | POA: Diagnosis not present

## 2022-02-13 DIAGNOSIS — I69398 Other sequelae of cerebral infarction: Secondary | ICD-10-CM | POA: Diagnosis not present

## 2022-02-13 DIAGNOSIS — M6281 Muscle weakness (generalized): Secondary | ICD-10-CM | POA: Diagnosis not present

## 2022-02-16 ENCOUNTER — Other Ambulatory Visit: Payer: Medicare Other | Admitting: *Deleted

## 2022-02-20 ENCOUNTER — Encounter: Payer: Self-pay | Admitting: Family Medicine

## 2022-02-20 NOTE — Telephone Encounter (Signed)
I'm totally fine giving an order for PT/OT at her facility. ?

## 2022-02-23 ENCOUNTER — Other Ambulatory Visit: Payer: Self-pay | Admitting: *Deleted

## 2022-02-23 DIAGNOSIS — Z8673 Personal history of transient ischemic attack (TIA), and cerebral infarction without residual deficits: Secondary | ICD-10-CM

## 2022-02-23 DIAGNOSIS — S0634AS Traumatic hemorrhage of right cerebrum with loss of consciousness status unknown, sequela: Secondary | ICD-10-CM

## 2022-02-23 NOTE — Progress Notes (Signed)
?  ?Cardiology Office Note ? ? ?Date:  02/25/2022  ? ?ID:  Kristy [name], DOB 1928/08/04, MRN [id number] ? ?PCP:  [name], DO  ?Cardiologist:   Minus Breeding, MD ? ? ?Chief Complaint  ?Patient presents with  ? Atrial Fibrillation  ? ? ?  ?History of Present Illness: ?Kristy [name] is a 88 y.o. female who presents for follow up of atrial fib.   She had been managed with Xarelto.  She did have some pulmonary HTN and TR on echo.  However, follow up echo in Feb of 2017 demonstrated only mod TR and no significant pulmonary HTN.  She was in atrial flutter and cardioversion was planned but she went back into NSR on her own.  She was in atrial fib when I saw her recently.   ? ?Since I last saw her she had a fall with intracranial hemorrhage.  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  She has been followed by neurology.  I did review these notes for this visit.  She had an EEG.  MRI follow-up in February demonstrated improved edema with evolution of the right frontal insular parenchymal hemorrhage.  She is also seen pulmonary.  She is now living at [facility].  She is here today with her son. ? ?I looked through the chart sensibly and it was not clear whether there was a syncopal episode with traumatic hemorrhage versus spontaneous.  She certainly had a conduction disturbance and could have had syncope but she does not recall the events.  She seems like she is done relatively well for her age and the events.  She has not had any further syncope.  She gets around in can walk but for long distances uses a wheelchair.  She is not having any chest pressure, neck or arm discomfort.  She is not having any new shortness of breath, PND or orthopnea. ? ?Her son is here from [place] and he is planning to move her to [place] and she is quite broken up about this. ? ? ?Past Medical History:  ?Diagnosis Date  ? A-fib ([place])   ? Cataract   ? Closed left arm fracture 2016  ? Colon polyps   ? Diverticulosis   ? Endometrial hyperplasia without  atypia, simple   ? Endometrial polyp   ? Fatty liver   ? GERD (gastroesophageal reflux disease)   ? Hiatal hernia   ? Hyperplastic colon polyp   ? Hypertension   ? Lipoma   ? Obesity   ? Optic neuropathy of both eyes 1989, 1997  ? SOB (shortness of breath)   ? Thrombocytosis   ? ? ?Past Surgical History:  ?Procedure Laterality Date  ? breast biopsy    ? CATARACT EXTRACTION    ? CHOLECYSTECTOMY  03/2002  ? DILATION AND CURETTAGE OF UTERUS    ? x 2  ? EYE SURGERY    ? HERNIA REPAIR    ? KNEE SURGERY    ? optic neuritis    ? umbilical hernia repair  09/2002  ? ? ? ?Current Outpatient Medications  ?Medication Sig Dispense Refill  ? Acetaminophen (TYLENOL CHILDRENS CHEWABLES PO) Take 325 mg by mouth every 6 (six) hours as needed.    ? atorvastatin (LIPITOR) 20 MG tablet Take 1 tablet (20 mg total) by mouth daily.    ? benzonatate (TESSALON PERLES) 100 MG capsule Take 1 capsule (100 mg total) by mouth 3 (three) times daily as needed for cough. 20 capsule 0  ? Cholecalciferol (  VITAMIN D3) 2000 UNITS TABS Take 4,000 Units by mouth daily.    ? diclofenac Sodium (VOLTAREN) 1 % GEL Apply 2 g topically 2 (two) times daily.    ? dorzolamide-timolol (COSOPT) 22.3-6.8 MG/ML ophthalmic solution Place 1 drop into both eyes 2 (two) times daily.    ? hydrochlorothiazide (MICROZIDE) 12.5 MG capsule Take 12.5 mg by mouth daily.    ? hydroxyurea (HYDREA) 500 MG capsule Take 500 mg by mouth as directed. One tablet every Monday and Thursday only    ? levETIRAcetam (KEPPRA) 250 MG tablet Take 1 tablet (250 mg total) by mouth 2 (two) times daily.    ? Lutein 6 MG CAPS Take 1 capsule by mouth daily.    ? metoprolol succinate (TOPROL-XL) 25 MG 24 hr tablet Take 1 tablet (25 mg total) by mouth daily. 90 tablet 3  ? omeprazole (PRILOSEC) 20 MG capsule TAKE 1 CAPSULE BY MOUTH  DAILY 90 capsule 3  ? polyethylene glycol (MIRALAX / GLYCOLAX) 17 g packet Take 17 g by mouth daily as needed for moderate constipation. 14 each 0  ? sertraline (ZOLOFT) 50  MG tablet Take 50 mg by mouth daily.    ? bethanechol (URECHOLINE) 5 MG tablet  (Patient not taking: Reported on 02/25/2022)    ? hydrALAZINE (APRESOLINE) 20 MG/ML injection  (Patient not taking: Reported on 02/25/2022)    ? Lactobacillus-Inulin (CULTURELLE ADULT ULT BALANCE) CAPS Take 1 capsule by mouth daily. For digestive health (Patient not taking: Reported on 02/25/2022) 90 capsule 3  ? meclizine (ANTIVERT) 12.5 MG tablet Take 1 tablet (12.5 mg total) by mouth as needed. Take as needed for  vertigo/ dizziness (Patient not taking: Reported on 02/25/2022) 30 tablet 0  ? ondansetron (ZOFRAN-ODT) 4 MG disintegrating tablet Take 1 tablet (4 mg total) by mouth every 8 (eight) hours as needed for nausea or vomiting. (Patient not taking: Reported on 02/25/2022) 20 tablet 0  ? polyethylene glycol powder (GLYCOLAX/MIRALAX) 17 GM/SCOOP powder Take 17 g by mouth daily.    ? sertraline (ZOLOFT) 100 MG tablet Take 100 mg by mouth daily. (Patient not taking: Reported on 02/25/2022)    ? SYSTANE PRESERVATIVE FREE 0.4-0.3 % SOLN Apply to eye.    ? ?No current facility-administered medications for this visit.  ? ? ?Allergies:   Codeine, Latanoprost, and Tramadol  ? ? ?ROS:  Please see the history of present illness.   Otherwise, review of systems are positive for none.   All other systems are reviewed and negative.  ? ? ?PHYSICAL EXAM: ?VS:  BP (!) 144/80   Pulse 65   Ht '5\' 2"'$  (1.575 m)   Wt 142 lb (64.4 kg)   BMI 25.97 kg/m?  , BMI Body mass index is 25.97 kg/m?.  ?GENERAL:  Well appearing somewhat frail  ?NECK:  No jugular venous distention, waveform within normal limits, carotid upstroke brisk and symmetric, no bruits, no thyromegaly ?LUNGS:  Clear to auscultation bilaterally ?CHEST:  Unremarkable ?HEART:  PMI not displaced or sustained,S1 and S2 within normal limits, no S3, no S4, no clicks, no rubs, no murmurs, irregular ?ABD:  Flat, positive bowel sounds normal in frequency in pitch, no bruits, no rebound, no guarding, no  midline pulsatile mass, no hepatomegaly, no splenomegaly ?EXT:  2 plus pulses throughout, no edema, no cyanosis no clubbing ? ? ?EKG:  EKG is  ordered today. ?The ekg ordered today demonstrates atrial fibrillation, rate 65, left axis deviation, poor anterior R wave progression.  Right bundle branch, left  anterior fascicular block ? ? ?Recent Labs: ?11/13/2021: Magnesium 2.1 ?12/11/2021: ALT 14; BUN 19; Creatinine, Ser 1.00; Hemoglobin 11.9; Platelets 260; Potassium 3.4; Sodium 141 ?01/13/2022: TSH 3.160  ? ? ?Lipid Panel ?   ?Component Value Date/Time  ? CHOL 166 02/08/2019 1107  ? TRIG 113 02/08/2019 1107  ? HDL 53 02/08/2019 1107  ? CHOLHDL 3.1 02/08/2019 1107  ? [place] 90 02/08/2019 1107  ? ?  ? ?Wt Readings from Last 3 Encounters:  ?02/25/22 142 lb (64.4 kg)  ?01/19/22 130 lb (59 kg)  ?01/19/22 130 lb (59 kg)  ?  ? ? ?Other studies Reviewed: ?Additional studies/ records that were reviewed today include: Extensive review of hospital neurology pulmonary records.Marland Kitchen ?Review of the above records demonstrates:  Please see elsewhere in the note.   ? ? ?ASSESSMENT AND PLAN: ? ?ATRIAL FLUTTER:   Ms. ARIEL DIMITRI has a CHA2DS2 - VASc score of 5.   She should be back on anticoagulation when cleared with his most recent MRI and I have sent a message to her neurologist.  I would use Eliquis.   ? ?INTRACRANIAL HEMORRHAGE: As above.  She needs to follow-up with neurology when she gets to [place]. ? ?HTN:  The blood pressure is at target.  No change in therapy.  ? ?DYSPNEA:   She is not having any new symptoms.  No further work-up.  She had extensive work-up previously.  ? ?SNORING: She was to get home sleep test because of this.  This could be pursued after she moves to [place]. ? ?CONDUCTION DISTURBANCE:    I am concerned about this and maybe she had a syncopal episode and this would be indication for pacing.  However, this was not detected in the hospital or during her rehab.  I will have her wear a 4-week monitor and talk to her son  about the fact that she definitely needs follow-up with a cardiologist in [place].  ? ? ? ?Current medicines are reviewed at length with the patient today.  The patient does not have concerns regarding medic

## 2022-02-24 ENCOUNTER — Ambulatory Visit: Payer: Medicare Other | Admitting: Neurology

## 2022-02-24 DIAGNOSIS — R410 Disorientation, unspecified: Secondary | ICD-10-CM

## 2022-02-24 DIAGNOSIS — G3184 Mild cognitive impairment, so stated: Secondary | ICD-10-CM

## 2022-02-24 NOTE — Patient Instructions (Signed)
? ? ?  History: ? ?31 with right frontoparietal intraparenchymal hemorrhage and  now confusion  ? ?EEG classification: Awake and drowsy ? ?Description of the recording: The background rhythms of this diffuse theta slowing. As the record progresses, the patient appears to remain in the waking state throughout the recording. Photic stimulation was performed, did not show any abnormalities. Hyperventilation was not performed. Toward the end of the recording, the patient enters the drowsy state with slight symmetric slowing seen. The patient never enters stage II sleep. There were right posterior quadrant sharps. There was right hemispheric focal slowing. EKG monitor shows no evidence of cardiac rhythm abnormalities with a heart rate of 60. ? ?Abnormality:  ?Right posterior quadrant sharps  ?Right focal slowing  ?Diffuse slowing ? 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  ? ? ? No evidence of interictal epileptiform discharges seen. A normal EEG does not exclude a diagnosis of epilepsy.  ? ? ?Alric Ran, MD ?[facility] ? ? ?

## 2022-02-24 NOTE — Procedures (Signed)
? ? ?  History: ? ?88 year old woman with right frontoparietal intraparenchymal hemorrhage and cognitive impairment  ? ?EEG classification: Awake and drowsy ? ?Description of the recording: The background rhythms of this recording consists of diffuse slowing. As the record progresses, the patient appears to remain in the waking state throughout the recording. Photic stimulation was performed, did not show any abnormalities. Hyperventilation was not performed. Toward the end of the recording, the patient enters the drowsy state with slight symmetric slowing seen. The patient never enters stage II sleep. There were presence of right posterior quadrant sharps. There was right hemispheric focal slowing. EKG monitor shows no evidence of cardiac rhythm abnormalities with a heart rate of 60. ? ?Abnormality:  ?Right posterior quadrant sharps  ?Right hemispheric slowing  ?Diffuse slowing  ? 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 There is also evidence of diffuse encephalopathy, nonspecific etiology.  ? ? ?Alric Ran, MD ?[facility] ? ? ?

## 2022-02-25 ENCOUNTER — Ambulatory Visit (INDEPENDENT_AMBULATORY_CARE_PROVIDER_SITE_OTHER): Payer: Medicare Other | Admitting: Cardiology

## 2022-02-25 ENCOUNTER — Encounter: Payer: Self-pay | Admitting: Cardiology

## 2022-02-25 ENCOUNTER — Other Ambulatory Visit: Payer: Self-pay

## 2022-02-25 VITALS — BP 144/80 | HR 65 | Ht 62.0 in | Wt 142.0 lb

## 2022-02-25 DIAGNOSIS — I480 Paroxysmal atrial fibrillation: Secondary | ICD-10-CM

## 2022-02-25 DIAGNOSIS — I10 Essential (primary) hypertension: Secondary | ICD-10-CM

## 2022-02-25 DIAGNOSIS — R0602 Shortness of breath: Secondary | ICD-10-CM | POA: Diagnosis not present

## 2022-02-25 NOTE — Patient Instructions (Signed)
Medication Instructions:  ?The current medical regimen is effective;  continue present plan and medications. ? ?*If you need a refill on your cardiac medications before your next appointment, please call your pharmacy* ? ?Testing/Procedures: ?Your physician has recommended that you wear an event monitor for 30 days. Event monitors are medical devices that record the heart?s electrical activity. Doctors most often [place] these monitors to diagnose arrhythmias. Arrhythmias are problems with the speed or rhythm of the heartbeat. The monitor is a small, portable device. You can wear one while you do your normal daily activities. This is usually used to diagnose what is causing palpitations/syncope (passing out).  You will be contacted about scheduling this.  ? ?Follow-Up: ?At [facility], you and your health needs are our priority.  As part of our continuing mission to provide you with exceptional heart care, we have created designated Provider Care Teams.  These Care Teams include your primary Cardiologist (physician) and Advanced Practice Providers (APPs -  Physician Assistants and Nurse Practitioners) who all work together to provide you with the care you need, when you need it. ? ?We recommend signing up for the patient portal called "MyChart".  Sign up information is provided on this After Visit Summary.  MyChart is used to connect with patients for Virtual Visits (Telemedicine).  Patients are able to view lab/test results, encounter notes, upcoming appointments, etc.  Non-urgent messages can be sent to your provider as well.   ?To learn more about what you can do with MyChart, go to [email or web address].   ? ?Your next appointment:   ?Follow up to be determined. ? ?Thank you for choosing [facility]!! ? ? ? ?

## 2022-02-26 ENCOUNTER — Telehealth: Payer: Self-pay | Admitting: *Deleted

## 2022-02-26 MED ORDER — APIXABAN 2.5 MG PO TABS: 2.5000 mg | ORAL_TABLET | Freq: Two times a day (BID) | ORAL | 6 refills | Status: DC

## 2022-02-26 NOTE — Telephone Encounter (Signed)
Kristy Breeding, MD  Shellia Cleverly, RN ?Can you make sure she and her son know that she can start the Eliquis 2.5 bid.  This is lower risk possibly than Xarelto and a good suggestion from Dr. Caryl Comes.  I don't know if we are calling this in or Dr. Leonie Man did.  Thanks.   ?  ?   ?Previous Messages ?  ?----- Message -----  ?From: Garvin Fila, MD  ?Sent: 02/25/2022   5:33 PM EDT  ?To: Kristy Breeding, MD  ? ?Yes ok to do so but I think we decided on eliquis  ?----- Message -----  ?From: Kristy Breeding, MD  ?Sent: 02/25/2022  12:11 PM EDT  ?To: Garvin Fila, MD  ? ?Hey.  Can you look at the MRI and tell me if you think it is OK to restart the Xarelto at the lower dose  ?Thanks  ? ?Spoke with son Kristy [name] regarding the above information.  He and pt are agreeable and request RX be sent into [facility] fax # [phone], ph [phone]. ? ?RX sent electronically to pharmacy of choice.  They will call back if any questions/concerns.  ? ? ?

## 2022-03-03 ENCOUNTER — Encounter: Payer: Self-pay | Admitting: Family Medicine

## 2022-03-03 ENCOUNTER — Ambulatory Visit (INDEPENDENT_AMBULATORY_CARE_PROVIDER_SITE_OTHER): Payer: Medicare Other | Admitting: Family Medicine

## 2022-03-03 VITALS — BP 122/70 | HR 73 | Temp 98.3°F | Ht 62.0 in | Wt 142.0 lb

## 2022-03-03 DIAGNOSIS — R102 Pelvic and perineal pain: Secondary | ICD-10-CM

## 2022-03-03 DIAGNOSIS — H6123 Impacted cerumen, bilateral: Secondary | ICD-10-CM

## 2022-03-03 DIAGNOSIS — Z87898 Personal history of other specified conditions: Secondary | ICD-10-CM | POA: Diagnosis not present

## 2022-03-03 DIAGNOSIS — Z1589 Genetic susceptibility to other disease: Secondary | ICD-10-CM | POA: Diagnosis not present

## 2022-03-03 DIAGNOSIS — D473 Essential (hemorrhagic) thrombocythemia: Secondary | ICD-10-CM | POA: Diagnosis not present

## 2022-03-03 LAB — URINALYSIS, ROUTINE W REFLEX MICROSCOPIC
Bilirubin, UA: NEGATIVE
Glucose, UA: NEGATIVE
Ketones, UA: NEGATIVE
Nitrite, UA: NEGATIVE
Protein,UA: NEGATIVE
RBC, UA: NEGATIVE
Specific Gravity, UA: 1.015 (ref 1.005–1.030)
Urobilinogen, Ur: 0.2 mg/dL (ref 0.2–1.0)
pH, UA: 6 (ref 5.0–7.5)

## 2022-03-03 LAB — MICROSCOPIC EXAMINATION
RBC, Urine: NONE SEEN /hpf (ref 0–2)
Renal Epithel, UA: NONE SEEN /hpf

## 2022-03-03 NOTE — Progress Notes (Signed)
? ?Subjective: ?CC: Ear fullness ?PCP: [name], DO ?HPI:Kristy [name] is a 88 y.o. female presenting to clinic today for: ? ?1.  Ear fullness ?Patient is brought to the office by her son.  They will be moving her to [place] soon but they are trying to get her into all of her providers before they make this move.  She has an appointment with the hearing doctor tomorrow and they asked that we check her ears to see if these need to be cleaned out.  She was advised to utilize Debrox in the ears prior to arrival that her son admits that he only used it a few times. ? ?2.  Motion sickness ?She wants some instructions as to what she can take for motion sickness.  She will be traveling to [place] via plane and she always gets motion sick when she does.  She does have some meclizine but was not sure as to the timing of when to take that prior to flight ? ?3.  Pelvic pain ?Patient has been having some pelvic discomfort.  No blood in urine.  No fevers.  No nausea or vomiting reported.  Concerned about possible UTI ? ?4.  Thrombocythemia ?Patient has some labs that need to be obtained for Dr. Leron Croak.  Like to know if they can get her platelets checked today.  Compliant with hydroxyurea. ? ? ?ROS: Per HPI ? ?Allergies  ?Allergen Reactions  ? Codeine Nausea Only  ? Latanoprost Other (See Comments)  ?  Red and burning  ? Tramadol Nausea Only  ? ?Past Medical History:  ?Diagnosis Date  ? A-fib ([place])   ? Cataract   ? Closed left arm fracture 2016  ? Colon polyps   ? Diverticulosis   ? Endometrial hyperplasia without atypia, simple   ? Endometrial polyp   ? Fatty liver   ? GERD (gastroesophageal reflux disease)   ? Hiatal hernia   ? Hyperplastic colon polyp   ? Hypertension   ? Lipoma   ? Obesity   ? Optic neuropathy of both eyes 1989, 1997  ? SOB (shortness of breath)   ? Thrombocytosis   ? ? ?Current Outpatient Medications:  ?  Acetaminophen (TYLENOL CHILDRENS CHEWABLES PO), Take 325 mg by mouth every 6 (six) hours as  needed., Disp: , Rfl:  ?  apixaban (ELIQUIS) 2.5 MG TABS tablet, Take 1 tablet (2.5 mg total) by mouth 2 (two) times daily., Disp: 60 tablet, Rfl: 6 ?  atorvastatin (LIPITOR) 20 MG tablet, Take 1 tablet (20 mg total) by mouth daily., Disp: , Rfl:  ?  Cholecalciferol (VITAMIN D3) 2000 UNITS TABS, Take 4,000 Units by mouth daily., Disp: , Rfl:  ?  diclofenac Sodium (VOLTAREN) 1 % GEL, Apply 2 g topically 2 (two) times daily., Disp: , Rfl:  ?  dorzolamide-timolol (COSOPT) 22.3-6.8 MG/ML ophthalmic solution, Place 1 drop into both eyes 2 (two) times daily., Disp: , Rfl:  ?  hydrochlorothiazide (MICROZIDE) 12.5 MG capsule, Take 12.5 mg by mouth daily., Disp: , Rfl:  ?  hydroxyurea (HYDREA) 500 MG capsule, Take 500 mg by mouth as directed. One tablet every Monday and Thursday only, Disp: , Rfl:  ?  Lutein 6 MG CAPS, Take 1 capsule by mouth daily., Disp: , Rfl:  ?  metoprolol succinate (TOPROL-XL) 25 MG 24 hr tablet, Take 1 tablet (25 mg total) by mouth daily., Disp: 90 tablet, Rfl: 3 ?  omeprazole (PRILOSEC) 20 MG capsule, TAKE 1 CAPSULE BY MOUTH  DAILY,  Disp: 90 capsule, Rfl: 3 ?  polyethylene glycol (MIRALAX / GLYCOLAX) 17 g packet, Take 17 g by mouth daily as needed for moderate constipation., Disp: 14 each, Rfl: 0 ?  polyethylene glycol powder (GLYCOLAX/MIRALAX) 17 GM/SCOOP powder, Take 17 g by mouth daily., Disp: , Rfl:  ?  sertraline (ZOLOFT) 50 MG tablet, Take 50 mg by mouth daily., Disp: , Rfl:  ?  SYSTANE PRESERVATIVE FREE 0.4-0.3 % SOLN, Apply to eye., Disp: , Rfl:  ?  bethanechol (URECHOLINE) 5 MG tablet, , Disp: , Rfl:  ?  hydrALAZINE (APRESOLINE) 20 MG/ML injection, , Disp: , Rfl:  ?  Lactobacillus-Inulin (CULTURELLE ADULT ULT BALANCE) CAPS, Take 1 capsule by mouth daily. For digestive health (Patient not taking: Reported on 02/25/2022), Disp: 90 capsule, Rfl: 3 ?  meclizine (ANTIVERT) 12.5 MG tablet, Take 1 tablet (12.5 mg total) by mouth as needed. Take as needed for  vertigo/ dizziness (Patient not taking:  Reported on 02/25/2022), Disp: 30 tablet, Rfl: 0 ?  ondansetron (ZOFRAN-ODT) 4 MG disintegrating tablet, Take 1 tablet (4 mg total) by mouth every 8 (eight) hours as needed for nausea or vomiting. (Patient not taking: Reported on 02/25/2022), Disp: 20 tablet, Rfl: 0 ?  sertraline (ZOLOFT) 100 MG tablet, Take 100 mg by mouth daily. (Patient not taking: Reported on 02/25/2022), Disp: , Rfl:  ?Social History  ? ?Socioeconomic History  ? Marital status: Widowed  ?  Spouse name: Not on file  ? Number of children: 2  ? Years of education: Not on file  ? Highest education level: Not on file  ?Occupational History  ? Occupation: [employer]  ?  Employer: RETIRED  ?  Comment: Retired  ?Tobacco Use  ? Smoking status: Former  ?  Types: Cigarettes  ?  Quit date: 12/07/1982  ?  Years since quitting: 39.2  ? Smokeless tobacco: Never  ?Vaping Use  ? Vaping Use: Never used  ?Substance and Sexual Activity  ? Alcohol use: No  ? Drug use: No  ? Sexual activity: Never  ?Other Topics Concern  ? Not on file  ?Social History Narrative  ? Son in [place]  ? Daughter in [place]  ? ?Social Determinants of Health  ? ?Financial Resource Strain: Low Risk   ? Difficulty of Paying Living Expenses: Not hard at all  ?Food Insecurity: No Food Insecurity  ? Worried About Charity fundraiser in the Last Year: Never true  ? Ran Out of Food in the Last Year: Never true  ?Transportation Needs: No Transportation Needs  ? Lack of Transportation (Medical): No  ? Lack of Transportation (Non-Medical): No  ?Physical Activity: Insufficiently Active  ? Days of Exercise per Week: 7 days  ? Minutes of Exercise per Session: 10 min  ?Stress: Stress Concern Present  ? Feeling of Stress : To some extent  ?Social Connections: Moderately Integrated  ? Frequency of Communication with Friends and Family: More than three times a week  ? Frequency of Social Gatherings with Friends and Family: Twice a week  ? Attends Religious Services: More than 4 times  per year  ? Active Member of Clubs or Organizations: Yes  ? Attends Archivist Meetings: 1 to 4 times per year  ? Marital Status: Widowed  ?Intimate Partner Violence: Not At Risk  ? Fear of Current or Ex-Partner: No  ? Emotionally Abused: No  ? Physically Abused: No  ? Sexually Abused: No  ? ?Family History  ?Problem Relation Age of Onset  ?  Diabetes Mother   ? Diabetes Sister   ? Stroke Father   ? Diabetes Brother   ? Heart disease Brother   ? Cancer Sister   ?     lung  ? Cancer Sister   ?     vulva  ? Cancer Sister   ? Dementia Sister   ? Osteoporosis Sister   ? Diabetes Sister   ? Osteoporosis Sister   ? Diabetes Sister   ? Cancer Brother   ?     bladder  ? Colon cancer Neg Hx   ? ? ?Objective: ?Office vital signs reviewed. ?BP 122/70   Pulse 73   Temp 98.3 ?F (36.8 ?C)   Ht 5' 2"  (1.575 m)   Wt 142 lb (64.4 kg)   SpO2 97%   BMI 25.97 kg/m?  ? ?Physical Examination:  ?General: Awake, alert, nontoxic-appearing elderly female, No acute distress ?HEENT: TMs obscured by cerumen.  External auditory canals extremely narrow ?Cardio: regular rate and rhythm  ?Pulm: Normal work of breathing on room air ?MSK: Arrives in wheelchair; no CVA tenderness to palpation ?Neuro: Intermittently confused ? ?Results for orders placed or performed in visit on 03/03/22 (from the past 24 hour(s))  ?Lactate Dehydrogenase     Status: Abnormal  ? Collection Time: 03/03/22  2:06 PM  ?Result Value Ref Range  ? LDH 236 (H) 119 - 226 IU/L  ? Narrative  ? Performed at:  [facility] ?[address] [place], [place]  [id number] ?Lab Director: Rush Farmer MD, Phone:  [phone]  ?Comprehensive metabolic panel     Status: Abnormal  ? Collection Time: 03/03/22  2:06 PM  ?Result Value Ref Range  ? Glucose 133 (H) 70 - 99 mg/dL  ? BUN 13 10 - 36 mg/dL  ? Creatinine, Ser 1.12 (H) 0.57 - 1.00 mg/dL  ? eGFR 46 (L) >59 mL/min/1.73  ? BUN/Creatinine Ratio 12 12 - 28  ? Sodium 142 134 - 144 mmol/L  ? Potassium 3.9 3.5 - 5.2  mmol/L  ? Chloride 104 96 - 106 mmol/L  ? CO2 22 20 - 29 mmol/L  ? Calcium 9.6 8.7 - 10.3 mg/dL  ? Total Protein 6.5 6.0 - 8.5 g/dL  ? Albumin 4.0 3.5 - 4.6 g/dL  ? Globulin, Total 2.5 1.5 - 4.5 g/dL  ? Album

## 2022-03-04 LAB — CBC WITH DIFFERENTIAL/PLATELET
Basophils Absolute: 0 10*3/uL (ref 0.0–0.2)
Basos: 1 %
EOS (ABSOLUTE): 0.1 10*3/uL (ref 0.0–0.4)
Eos: 2 %
Hematocrit: 33 % — ABNORMAL LOW (ref 34.0–46.6)
Hemoglobin: 11.1 g/dL (ref 11.1–15.9)
Immature Grans (Abs): 0 10*3/uL (ref 0.0–0.1)
Immature Granulocytes: 0 %
Lymphocytes Absolute: 1.2 10*3/uL (ref 0.7–3.1)
Lymphs: 20 %
MCH: 31 pg (ref 26.6–33.0)
MCHC: 33.6 g/dL (ref 31.5–35.7)
MCV: 92 fL (ref 79–97)
Monocytes Absolute: 0.6 10*3/uL (ref 0.1–0.9)
Monocytes: 10 %
Neutrophils Absolute: 4 10*3/uL (ref 1.4–7.0)
Neutrophils: 67 %
Platelets: 349 10*3/uL (ref 150–450)
RBC: 3.58 x10E6/uL — ABNORMAL LOW (ref 3.77–5.28)
RDW: 15 % (ref 11.7–15.4)
WBC: 5.9 10*3/uL (ref 3.4–10.8)

## 2022-03-04 LAB — COMPREHENSIVE METABOLIC PANEL
ALT: 19 IU/L (ref 0–32)
AST: 23 IU/L (ref 0–40)
Albumin/Globulin Ratio: 1.6 (ref 1.2–2.2)
Albumin: 4 g/dL (ref 3.5–4.6)
Alkaline Phosphatase: 91 IU/L (ref 44–121)
BUN/Creatinine Ratio: 12 (ref 12–28)
BUN: 13 mg/dL (ref 10–36)
Bilirubin Total: 0.6 mg/dL (ref 0.0–1.2)
CO2: 22 mmol/L (ref 20–29)
Calcium: 9.6 mg/dL (ref 8.7–10.3)
Chloride: 104 mmol/L (ref 96–106)
Creatinine, Ser: 1.12 mg/dL — ABNORMAL HIGH (ref 0.57–1.00)
Globulin, Total: 2.5 g/dL (ref 1.5–4.5)
Glucose: 133 mg/dL — ABNORMAL HIGH (ref 70–99)
Potassium: 3.9 mmol/L (ref 3.5–5.2)
Sodium: 142 mmol/L (ref 134–144)
Total Protein: 6.5 g/dL (ref 6.0–8.5)
eGFR: 46 mL/min/{1.73_m2} — ABNORMAL LOW (ref >59)

## 2022-03-04 LAB — LACTATE DEHYDROGENASE: LDH: 236 IU/L — ABNORMAL HIGH (ref 119–226)

## 2022-03-05 ENCOUNTER — Other Ambulatory Visit: Payer: Self-pay | Admitting: Family Medicine

## 2022-03-05 ENCOUNTER — Encounter: Payer: Self-pay | Admitting: Family Medicine

## 2022-03-05 DIAGNOSIS — R2681 Unsteadiness on feet: Secondary | ICD-10-CM | POA: Diagnosis not present

## 2022-03-05 DIAGNOSIS — R3989 Other symptoms and signs involving the genitourinary system: Secondary | ICD-10-CM

## 2022-03-05 DIAGNOSIS — M6281 Muscle weakness (generalized): Secondary | ICD-10-CM | POA: Diagnosis not present

## 2022-03-05 DIAGNOSIS — R110 Nausea: Secondary | ICD-10-CM

## 2022-03-05 DIAGNOSIS — Z87898 Personal history of other specified conditions: Secondary | ICD-10-CM

## 2022-03-05 MED ORDER — ONDANSETRON 4 MG PO TBDP: 4.0000 mg | ORAL_TABLET | Freq: Three times a day (TID) | ORAL | 0 refills | Status: AC | PRN

## 2022-03-05 MED ORDER — SULFAMETHOXAZOLE-TRIMETHOPRIM 800-160 MG PO TABS: 1.0000 | ORAL_TABLET | Freq: Two times a day (BID) | ORAL | 0 refills | Status: AC

## 2022-03-05 MED ORDER — LORAZEPAM 0.5 MG PO TABS: ORAL_TABLET | ORAL | 0 refills | Status: AC

## 2022-03-05 MED ORDER — MECLIZINE HCL 12.5 MG PO TABS: 12.5000 mg | ORAL_TABLET | ORAL | 0 refills | Status: AC | PRN

## 2022-03-09 DIAGNOSIS — M6281 Muscle weakness (generalized): Secondary | ICD-10-CM | POA: Diagnosis not present

## 2022-03-09 DIAGNOSIS — R2681 Unsteadiness on feet: Secondary | ICD-10-CM | POA: Diagnosis not present

## 2022-03-10 DIAGNOSIS — H04123 Dry eye syndrome of bilateral lacrimal glands: Secondary | ICD-10-CM | POA: Diagnosis not present

## 2022-03-10 DIAGNOSIS — H401233 Low-tension glaucoma, bilateral, severe stage: Secondary | ICD-10-CM | POA: Diagnosis not present

## 2022-03-10 DIAGNOSIS — H353132 Nonexudative age-related macular degeneration, bilateral, intermediate dry stage: Secondary | ICD-10-CM | POA: Diagnosis not present

## 2022-03-10 DIAGNOSIS — H47013 Ischemic optic neuropathy, bilateral: Secondary | ICD-10-CM | POA: Diagnosis not present

## 2022-03-10 DIAGNOSIS — Z961 Presence of intraocular lens: Secondary | ICD-10-CM | POA: Diagnosis not present

## 2022-03-13 DIAGNOSIS — M6281 Muscle weakness (generalized): Secondary | ICD-10-CM | POA: Diagnosis not present

## 2022-03-13 DIAGNOSIS — R2681 Unsteadiness on feet: Secondary | ICD-10-CM | POA: Diagnosis not present

## 2022-03-16 ENCOUNTER — Other Ambulatory Visit (HOSPITAL_COMMUNITY): Payer: Medicare Other

## 2022-03-16 DIAGNOSIS — M6281 Muscle weakness (generalized): Secondary | ICD-10-CM | POA: Diagnosis not present

## 2022-03-16 DIAGNOSIS — S52514D Nondisplaced fracture of right radial styloid process, subsequent encounter for closed fracture with routine healing: Secondary | ICD-10-CM | POA: Diagnosis not present

## 2022-03-16 DIAGNOSIS — Z9181 History of falling: Secondary | ICD-10-CM | POA: Diagnosis not present

## 2022-03-16 DIAGNOSIS — I69398 Other sequelae of cerebral infarction: Secondary | ICD-10-CM | POA: Diagnosis not present

## 2022-03-17 DIAGNOSIS — R2681 Unsteadiness on feet: Secondary | ICD-10-CM | POA: Diagnosis not present

## 2022-03-17 DIAGNOSIS — M6281 Muscle weakness (generalized): Secondary | ICD-10-CM | POA: Diagnosis not present

## 2022-03-19 DIAGNOSIS — R2681 Unsteadiness on feet: Secondary | ICD-10-CM | POA: Diagnosis not present

## 2022-03-19 DIAGNOSIS — M6281 Muscle weakness (generalized): Secondary | ICD-10-CM | POA: Diagnosis not present

## 2022-03-20 NOTE — Progress Notes (Deleted)
NO SHOW

## 2022-03-23 ENCOUNTER — Inpatient Hospital Stay (HOSPITAL_COMMUNITY): Payer: Medicare Other | Admitting: Physician Assistant

## 2022-03-26 DIAGNOSIS — K5900 Constipation, unspecified: Secondary | ICD-10-CM | POA: Diagnosis not present

## 2022-03-26 DIAGNOSIS — G3184 Mild cognitive impairment, so stated: Secondary | ICD-10-CM | POA: Diagnosis not present

## 2022-03-26 DIAGNOSIS — R2689 Other abnormalities of gait and mobility: Secondary | ICD-10-CM | POA: Diagnosis not present

## 2022-03-26 DIAGNOSIS — E785 Hyperlipidemia, unspecified: Secondary | ICD-10-CM | POA: Diagnosis not present

## 2022-03-26 DIAGNOSIS — I4891 Unspecified atrial fibrillation: Secondary | ICD-10-CM | POA: Diagnosis not present

## 2022-03-31 ENCOUNTER — Other Ambulatory Visit: Payer: Self-pay | Admitting: Family Medicine

## 2022-03-31 DIAGNOSIS — R110 Nausea: Secondary | ICD-10-CM

## 2022-03-31 DIAGNOSIS — Z87898 Personal history of other specified conditions: Secondary | ICD-10-CM

## 2022-04-01 DIAGNOSIS — R300 Dysuria: Secondary | ICD-10-CM | POA: Diagnosis not present

## 2022-04-01 DIAGNOSIS — R350 Frequency of micturition: Secondary | ICD-10-CM | POA: Diagnosis not present

## 2022-04-07 DIAGNOSIS — R300 Dysuria: Secondary | ICD-10-CM | POA: Diagnosis not present

## 2022-04-07 DIAGNOSIS — R350 Frequency of micturition: Secondary | ICD-10-CM | POA: Diagnosis not present

## 2022-04-07 DIAGNOSIS — I10 Essential (primary) hypertension: Secondary | ICD-10-CM | POA: Diagnosis not present

## 2022-04-14 DIAGNOSIS — K5900 Constipation, unspecified: Secondary | ICD-10-CM | POA: Diagnosis not present

## 2022-04-14 DIAGNOSIS — G3184 Mild cognitive impairment, so stated: Secondary | ICD-10-CM | POA: Diagnosis not present

## 2022-04-14 DIAGNOSIS — R2689 Other abnormalities of gait and mobility: Secondary | ICD-10-CM | POA: Diagnosis not present

## 2022-04-14 DIAGNOSIS — I4891 Unspecified atrial fibrillation: Secondary | ICD-10-CM | POA: Diagnosis not present

## 2022-04-14 DIAGNOSIS — E785 Hyperlipidemia, unspecified: Secondary | ICD-10-CM | POA: Diagnosis not present

## 2022-04-15 DIAGNOSIS — Z9181 History of falling: Secondary | ICD-10-CM | POA: Diagnosis not present

## 2022-04-15 DIAGNOSIS — M6281 Muscle weakness (generalized): Secondary | ICD-10-CM | POA: Diagnosis not present

## 2022-04-15 DIAGNOSIS — S52514D Nondisplaced fracture of right radial styloid process, subsequent encounter for closed fracture with routine healing: Secondary | ICD-10-CM | POA: Diagnosis not present

## 2022-04-15 DIAGNOSIS — I69398 Other sequelae of cerebral infarction: Secondary | ICD-10-CM | POA: Diagnosis not present

## 2022-04-21 DIAGNOSIS — I4891 Unspecified atrial fibrillation: Secondary | ICD-10-CM | POA: Diagnosis not present

## 2022-04-21 DIAGNOSIS — R413 Other amnesia: Secondary | ICD-10-CM | POA: Diagnosis not present

## 2022-04-21 DIAGNOSIS — S06369A Traumatic hemorrhage of cerebrum, unspecified, with loss of consciousness of unspecified duration, initial encounter: Secondary | ICD-10-CM | POA: Diagnosis not present

## 2022-04-24 DIAGNOSIS — H903 Sensorineural hearing loss, bilateral: Secondary | ICD-10-CM | POA: Diagnosis not present

## 2022-04-24 DIAGNOSIS — J301 Allergic rhinitis due to pollen: Secondary | ICD-10-CM | POA: Diagnosis not present

## 2022-04-24 DIAGNOSIS — H9313 Tinnitus, bilateral: Secondary | ICD-10-CM | POA: Diagnosis not present

## 2022-04-24 DIAGNOSIS — H6123 Impacted cerumen, bilateral: Secondary | ICD-10-CM | POA: Diagnosis not present

## 2022-05-08 DIAGNOSIS — N390 Urinary tract infection, site not specified: Secondary | ICD-10-CM | POA: Diagnosis not present

## 2022-05-12 DIAGNOSIS — S06369A Traumatic hemorrhage of cerebrum, unspecified, with loss of consciousness of unspecified duration, initial encounter: Secondary | ICD-10-CM | POA: Diagnosis not present

## 2022-05-12 DIAGNOSIS — M6250 Muscle wasting and atrophy, not elsewhere classified, unspecified site: Secondary | ICD-10-CM | POA: Diagnosis not present

## 2022-05-12 DIAGNOSIS — R2689 Other abnormalities of gait and mobility: Secondary | ICD-10-CM | POA: Diagnosis not present

## 2022-05-14 ENCOUNTER — Encounter (HOSPITAL_COMMUNITY): Payer: Self-pay

## 2022-05-15 ENCOUNTER — Other Ambulatory Visit (HOSPITAL_COMMUNITY): Payer: Self-pay

## 2022-05-15 DIAGNOSIS — M6250 Muscle wasting and atrophy, not elsewhere classified, unspecified site: Secondary | ICD-10-CM | POA: Diagnosis not present

## 2022-05-15 DIAGNOSIS — R2689 Other abnormalities of gait and mobility: Secondary | ICD-10-CM | POA: Diagnosis not present

## 2022-05-15 DIAGNOSIS — S06369A Traumatic hemorrhage of cerebrum, unspecified, with loss of consciousness of unspecified duration, initial encounter: Secondary | ICD-10-CM | POA: Diagnosis not present

## 2022-05-15 MED ORDER — HYDROXYUREA 500 MG PO CAPS: 500.0000 mg | ORAL_CAPSULE | ORAL | 0 refills | Status: AC

## 2022-05-16 DIAGNOSIS — I69398 Other sequelae of cerebral infarction: Secondary | ICD-10-CM | POA: Diagnosis not present

## 2022-05-16 DIAGNOSIS — M6281 Muscle weakness (generalized): Secondary | ICD-10-CM | POA: Diagnosis not present

## 2022-05-16 DIAGNOSIS — Z9181 History of falling: Secondary | ICD-10-CM | POA: Diagnosis not present

## 2022-05-16 DIAGNOSIS — S52514D Nondisplaced fracture of right radial styloid process, subsequent encounter for closed fracture with routine healing: Secondary | ICD-10-CM | POA: Diagnosis not present

## 2022-05-19 ENCOUNTER — Other Ambulatory Visit (HOSPITAL_COMMUNITY): Payer: Self-pay | Admitting: Physician Assistant

## 2022-05-19 DIAGNOSIS — R2689 Other abnormalities of gait and mobility: Secondary | ICD-10-CM | POA: Diagnosis not present

## 2022-05-19 DIAGNOSIS — S06369A Traumatic hemorrhage of cerebrum, unspecified, with loss of consciousness of unspecified duration, initial encounter: Secondary | ICD-10-CM | POA: Diagnosis not present

## 2022-05-19 DIAGNOSIS — M6250 Muscle wasting and atrophy, not elsewhere classified, unspecified site: Secondary | ICD-10-CM | POA: Diagnosis not present

## 2022-05-21 DIAGNOSIS — E785 Hyperlipidemia, unspecified: Secondary | ICD-10-CM | POA: Diagnosis not present

## 2022-05-21 DIAGNOSIS — I4891 Unspecified atrial fibrillation: Secondary | ICD-10-CM | POA: Diagnosis not present

## 2022-05-21 DIAGNOSIS — R2689 Other abnormalities of gait and mobility: Secondary | ICD-10-CM | POA: Diagnosis not present

## 2022-05-21 DIAGNOSIS — G3184 Mild cognitive impairment, so stated: Secondary | ICD-10-CM | POA: Diagnosis not present

## 2022-05-21 DIAGNOSIS — K5900 Constipation, unspecified: Secondary | ICD-10-CM | POA: Diagnosis not present

## 2022-05-22 DIAGNOSIS — M6250 Muscle wasting and atrophy, not elsewhere classified, unspecified site: Secondary | ICD-10-CM | POA: Diagnosis not present

## 2022-05-22 DIAGNOSIS — S06369A Traumatic hemorrhage of cerebrum, unspecified, with loss of consciousness of unspecified duration, initial encounter: Secondary | ICD-10-CM | POA: Diagnosis not present

## 2022-05-22 DIAGNOSIS — R2689 Other abnormalities of gait and mobility: Secondary | ICD-10-CM | POA: Diagnosis not present

## 2022-05-26 DIAGNOSIS — M6250 Muscle wasting and atrophy, not elsewhere classified, unspecified site: Secondary | ICD-10-CM | POA: Diagnosis not present

## 2022-05-26 DIAGNOSIS — R2689 Other abnormalities of gait and mobility: Secondary | ICD-10-CM | POA: Diagnosis not present

## 2022-05-26 DIAGNOSIS — S06369A Traumatic hemorrhage of cerebrum, unspecified, with loss of consciousness of unspecified duration, initial encounter: Secondary | ICD-10-CM | POA: Diagnosis not present

## 2022-05-28 DIAGNOSIS — R2689 Other abnormalities of gait and mobility: Secondary | ICD-10-CM | POA: Diagnosis not present

## 2022-05-28 DIAGNOSIS — M6250 Muscle wasting and atrophy, not elsewhere classified, unspecified site: Secondary | ICD-10-CM | POA: Diagnosis not present

## 2022-05-28 DIAGNOSIS — S06369A Traumatic hemorrhage of cerebrum, unspecified, with loss of consciousness of unspecified duration, initial encounter: Secondary | ICD-10-CM | POA: Diagnosis not present

## 2022-06-01 ENCOUNTER — Encounter: Payer: Self-pay | Admitting: Family Medicine

## 2022-06-02 DIAGNOSIS — R2689 Other abnormalities of gait and mobility: Secondary | ICD-10-CM | POA: Diagnosis not present

## 2022-06-02 DIAGNOSIS — S06369A Traumatic hemorrhage of cerebrum, unspecified, with loss of consciousness of unspecified duration, initial encounter: Secondary | ICD-10-CM | POA: Diagnosis not present

## 2022-06-02 DIAGNOSIS — M6250 Muscle wasting and atrophy, not elsewhere classified, unspecified site: Secondary | ICD-10-CM | POA: Diagnosis not present

## 2022-06-04 DIAGNOSIS — S06369A Traumatic hemorrhage of cerebrum, unspecified, with loss of consciousness of unspecified duration, initial encounter: Secondary | ICD-10-CM | POA: Diagnosis not present

## 2022-06-04 DIAGNOSIS — R2689 Other abnormalities of gait and mobility: Secondary | ICD-10-CM | POA: Diagnosis not present

## 2022-06-04 DIAGNOSIS — M6250 Muscle wasting and atrophy, not elsewhere classified, unspecified site: Secondary | ICD-10-CM | POA: Diagnosis not present

## 2022-06-09 DIAGNOSIS — R2689 Other abnormalities of gait and mobility: Secondary | ICD-10-CM | POA: Diagnosis not present

## 2022-06-09 DIAGNOSIS — M6250 Muscle wasting and atrophy, not elsewhere classified, unspecified site: Secondary | ICD-10-CM | POA: Diagnosis not present

## 2022-06-09 DIAGNOSIS — S06369A Traumatic hemorrhage of cerebrum, unspecified, with loss of consciousness of unspecified duration, initial encounter: Secondary | ICD-10-CM | POA: Diagnosis not present

## 2022-06-12 DIAGNOSIS — M6250 Muscle wasting and atrophy, not elsewhere classified, unspecified site: Secondary | ICD-10-CM | POA: Diagnosis not present

## 2022-06-12 DIAGNOSIS — S06369A Traumatic hemorrhage of cerebrum, unspecified, with loss of consciousness of unspecified duration, initial encounter: Secondary | ICD-10-CM | POA: Diagnosis not present

## 2022-06-12 DIAGNOSIS — R2689 Other abnormalities of gait and mobility: Secondary | ICD-10-CM | POA: Diagnosis not present

## 2022-06-15 DIAGNOSIS — M6281 Muscle weakness (generalized): Secondary | ICD-10-CM | POA: Diagnosis not present

## 2022-06-15 DIAGNOSIS — I69398 Other sequelae of cerebral infarction: Secondary | ICD-10-CM | POA: Diagnosis not present

## 2022-06-15 DIAGNOSIS — S52514D Nondisplaced fracture of right radial styloid process, subsequent encounter for closed fracture with routine healing: Secondary | ICD-10-CM | POA: Diagnosis not present

## 2022-06-15 DIAGNOSIS — Z9181 History of falling: Secondary | ICD-10-CM | POA: Diagnosis not present

## 2022-06-16 DIAGNOSIS — S06369A Traumatic hemorrhage of cerebrum, unspecified, with loss of consciousness of unspecified duration, initial encounter: Secondary | ICD-10-CM | POA: Diagnosis not present

## 2022-06-16 DIAGNOSIS — M6250 Muscle wasting and atrophy, not elsewhere classified, unspecified site: Secondary | ICD-10-CM | POA: Diagnosis not present

## 2022-06-16 DIAGNOSIS — R2689 Other abnormalities of gait and mobility: Secondary | ICD-10-CM | POA: Diagnosis not present

## 2022-06-19 DIAGNOSIS — S06369A Traumatic hemorrhage of cerebrum, unspecified, with loss of consciousness of unspecified duration, initial encounter: Secondary | ICD-10-CM | POA: Diagnosis not present

## 2022-06-19 DIAGNOSIS — R2689 Other abnormalities of gait and mobility: Secondary | ICD-10-CM | POA: Diagnosis not present

## 2022-06-19 DIAGNOSIS — M6250 Muscle wasting and atrophy, not elsewhere classified, unspecified site: Secondary | ICD-10-CM | POA: Diagnosis not present

## 2022-06-23 DIAGNOSIS — S06369A Traumatic hemorrhage of cerebrum, unspecified, with loss of consciousness of unspecified duration, initial encounter: Secondary | ICD-10-CM | POA: Diagnosis not present

## 2022-06-23 DIAGNOSIS — R2689 Other abnormalities of gait and mobility: Secondary | ICD-10-CM | POA: Diagnosis not present

## 2022-06-23 DIAGNOSIS — M6250 Muscle wasting and atrophy, not elsewhere classified, unspecified site: Secondary | ICD-10-CM | POA: Diagnosis not present

## 2022-06-25 DIAGNOSIS — D473 Essential (hemorrhagic) thrombocythemia: Secondary | ICD-10-CM | POA: Diagnosis not present

## 2022-06-25 DIAGNOSIS — I10 Essential (primary) hypertension: Secondary | ICD-10-CM | POA: Diagnosis not present

## 2022-06-30 DIAGNOSIS — I4891 Unspecified atrial fibrillation: Secondary | ICD-10-CM | POA: Diagnosis not present

## 2022-06-30 DIAGNOSIS — G3184 Mild cognitive impairment, so stated: Secondary | ICD-10-CM | POA: Diagnosis not present

## 2022-06-30 DIAGNOSIS — R2689 Other abnormalities of gait and mobility: Secondary | ICD-10-CM | POA: Diagnosis not present

## 2022-06-30 DIAGNOSIS — R300 Dysuria: Secondary | ICD-10-CM | POA: Diagnosis not present

## 2022-06-30 DIAGNOSIS — R350 Frequency of micturition: Secondary | ICD-10-CM | POA: Diagnosis not present

## 2022-06-30 DIAGNOSIS — S06369A Traumatic hemorrhage of cerebrum, unspecified, with loss of consciousness of unspecified duration, initial encounter: Secondary | ICD-10-CM | POA: Diagnosis not present

## 2022-06-30 DIAGNOSIS — M6250 Muscle wasting and atrophy, not elsewhere classified, unspecified site: Secondary | ICD-10-CM | POA: Diagnosis not present

## 2022-06-30 DIAGNOSIS — E785 Hyperlipidemia, unspecified: Secondary | ICD-10-CM | POA: Diagnosis not present

## 2022-06-30 DIAGNOSIS — K5900 Constipation, unspecified: Secondary | ICD-10-CM | POA: Diagnosis not present

## 2022-07-09 DIAGNOSIS — S06369A Traumatic hemorrhage of cerebrum, unspecified, with loss of consciousness of unspecified duration, initial encounter: Secondary | ICD-10-CM | POA: Diagnosis not present

## 2022-07-09 DIAGNOSIS — M6250 Muscle wasting and atrophy, not elsewhere classified, unspecified site: Secondary | ICD-10-CM | POA: Diagnosis not present

## 2022-07-09 DIAGNOSIS — R2689 Other abnormalities of gait and mobility: Secondary | ICD-10-CM | POA: Diagnosis not present

## 2022-07-10 DIAGNOSIS — R2689 Other abnormalities of gait and mobility: Secondary | ICD-10-CM | POA: Diagnosis not present

## 2022-07-10 DIAGNOSIS — M6250 Muscle wasting and atrophy, not elsewhere classified, unspecified site: Secondary | ICD-10-CM | POA: Diagnosis not present

## 2022-07-10 DIAGNOSIS — S06369A Traumatic hemorrhage of cerebrum, unspecified, with loss of consciousness of unspecified duration, initial encounter: Secondary | ICD-10-CM | POA: Diagnosis not present

## 2022-07-15 DIAGNOSIS — S06369A Traumatic hemorrhage of cerebrum, unspecified, with loss of consciousness of unspecified duration, initial encounter: Secondary | ICD-10-CM | POA: Diagnosis not present

## 2022-07-15 DIAGNOSIS — R2689 Other abnormalities of gait and mobility: Secondary | ICD-10-CM | POA: Diagnosis not present

## 2022-07-15 DIAGNOSIS — M6250 Muscle wasting and atrophy, not elsewhere classified, unspecified site: Secondary | ICD-10-CM | POA: Diagnosis not present

## 2022-07-16 DIAGNOSIS — I69398 Other sequelae of cerebral infarction: Secondary | ICD-10-CM | POA: Diagnosis not present

## 2022-07-16 DIAGNOSIS — E785 Hyperlipidemia, unspecified: Secondary | ICD-10-CM | POA: Diagnosis not present

## 2022-07-16 DIAGNOSIS — M6281 Muscle weakness (generalized): Secondary | ICD-10-CM | POA: Diagnosis not present

## 2022-07-16 DIAGNOSIS — S52514D Nondisplaced fracture of right radial styloid process, subsequent encounter for closed fracture with routine healing: Secondary | ICD-10-CM | POA: Diagnosis not present

## 2022-07-16 DIAGNOSIS — K5900 Constipation, unspecified: Secondary | ICD-10-CM | POA: Diagnosis not present

## 2022-07-16 DIAGNOSIS — Z9181 History of falling: Secondary | ICD-10-CM | POA: Diagnosis not present

## 2022-07-16 DIAGNOSIS — I4891 Unspecified atrial fibrillation: Secondary | ICD-10-CM | POA: Diagnosis not present

## 2022-07-16 DIAGNOSIS — R2689 Other abnormalities of gait and mobility: Secondary | ICD-10-CM | POA: Diagnosis not present

## 2022-07-16 DIAGNOSIS — G3184 Mild cognitive impairment, so stated: Secondary | ICD-10-CM | POA: Diagnosis not present

## 2022-07-24 DIAGNOSIS — R2689 Other abnormalities of gait and mobility: Secondary | ICD-10-CM | POA: Diagnosis not present

## 2022-07-24 DIAGNOSIS — S06369A Traumatic hemorrhage of cerebrum, unspecified, with loss of consciousness of unspecified duration, initial encounter: Secondary | ICD-10-CM | POA: Diagnosis not present

## 2022-07-24 DIAGNOSIS — M6250 Muscle wasting and atrophy, not elsewhere classified, unspecified site: Secondary | ICD-10-CM | POA: Diagnosis not present

## 2022-07-27 DIAGNOSIS — M6250 Muscle wasting and atrophy, not elsewhere classified, unspecified site: Secondary | ICD-10-CM | POA: Diagnosis not present

## 2022-07-27 DIAGNOSIS — R2689 Other abnormalities of gait and mobility: Secondary | ICD-10-CM | POA: Diagnosis not present

## 2022-07-27 DIAGNOSIS — S06369A Traumatic hemorrhage of cerebrum, unspecified, with loss of consciousness of unspecified duration, initial encounter: Secondary | ICD-10-CM | POA: Diagnosis not present

## 2022-07-28 ENCOUNTER — Other Ambulatory Visit: Payer: Self-pay | Admitting: Family Medicine

## 2022-07-31 DIAGNOSIS — R2689 Other abnormalities of gait and mobility: Secondary | ICD-10-CM | POA: Diagnosis not present

## 2022-07-31 DIAGNOSIS — M6250 Muscle wasting and atrophy, not elsewhere classified, unspecified site: Secondary | ICD-10-CM | POA: Diagnosis not present

## 2022-07-31 DIAGNOSIS — S06369A Traumatic hemorrhage of cerebrum, unspecified, with loss of consciousness of unspecified duration, initial encounter: Secondary | ICD-10-CM | POA: Diagnosis not present

## 2022-08-03 DIAGNOSIS — M6250 Muscle wasting and atrophy, not elsewhere classified, unspecified site: Secondary | ICD-10-CM | POA: Diagnosis not present

## 2022-08-03 DIAGNOSIS — S06369A Traumatic hemorrhage of cerebrum, unspecified, with loss of consciousness of unspecified duration, initial encounter: Secondary | ICD-10-CM | POA: Diagnosis not present

## 2022-08-03 DIAGNOSIS — R2689 Other abnormalities of gait and mobility: Secondary | ICD-10-CM | POA: Diagnosis not present

## 2022-08-04 ENCOUNTER — Other Ambulatory Visit: Payer: Self-pay | Admitting: Family Medicine

## 2022-08-07 DIAGNOSIS — M6250 Muscle wasting and atrophy, not elsewhere classified, unspecified site: Secondary | ICD-10-CM | POA: Diagnosis not present

## 2022-08-07 DIAGNOSIS — R2689 Other abnormalities of gait and mobility: Secondary | ICD-10-CM | POA: Diagnosis not present

## 2022-08-07 DIAGNOSIS — S06369A Traumatic hemorrhage of cerebrum, unspecified, with loss of consciousness of unspecified duration, initial encounter: Secondary | ICD-10-CM | POA: Diagnosis not present

## 2022-08-12 ENCOUNTER — Other Ambulatory Visit: Payer: Self-pay | Admitting: Family Medicine

## 2022-08-13 DIAGNOSIS — E785 Hyperlipidemia, unspecified: Secondary | ICD-10-CM | POA: Diagnosis not present

## 2022-08-13 DIAGNOSIS — K5900 Constipation, unspecified: Secondary | ICD-10-CM | POA: Diagnosis not present

## 2022-08-13 DIAGNOSIS — R2689 Other abnormalities of gait and mobility: Secondary | ICD-10-CM | POA: Diagnosis not present

## 2022-08-13 DIAGNOSIS — I4891 Unspecified atrial fibrillation: Secondary | ICD-10-CM | POA: Diagnosis not present

## 2022-08-13 DIAGNOSIS — G3184 Mild cognitive impairment, so stated: Secondary | ICD-10-CM | POA: Diagnosis not present

## 2022-08-14 DIAGNOSIS — R350 Frequency of micturition: Secondary | ICD-10-CM | POA: Diagnosis not present

## 2022-08-14 DIAGNOSIS — R300 Dysuria: Secondary | ICD-10-CM | POA: Diagnosis not present

## 2022-08-16 DIAGNOSIS — M6281 Muscle weakness (generalized): Secondary | ICD-10-CM | POA: Diagnosis not present

## 2022-08-16 DIAGNOSIS — S52514D Nondisplaced fracture of right radial styloid process, subsequent encounter for closed fracture with routine healing: Secondary | ICD-10-CM | POA: Diagnosis not present

## 2022-08-16 DIAGNOSIS — I69398 Other sequelae of cerebral infarction: Secondary | ICD-10-CM | POA: Diagnosis not present

## 2022-08-16 DIAGNOSIS — Z9181 History of falling: Secondary | ICD-10-CM | POA: Diagnosis not present

## 2022-08-18 ENCOUNTER — Other Ambulatory Visit: Payer: Self-pay | Admitting: Family Medicine

## 2022-08-28 DIAGNOSIS — S06369A Traumatic hemorrhage of cerebrum, unspecified, with loss of consciousness of unspecified duration, initial encounter: Secondary | ICD-10-CM | POA: Diagnosis not present

## 2022-09-01 ENCOUNTER — Other Ambulatory Visit: Payer: Self-pay | Admitting: Family Medicine

## 2022-09-03 DIAGNOSIS — S06369A Traumatic hemorrhage of cerebrum, unspecified, with loss of consciousness of unspecified duration, initial encounter: Secondary | ICD-10-CM | POA: Diagnosis not present

## 2022-09-04 DIAGNOSIS — S06369A Traumatic hemorrhage of cerebrum, unspecified, with loss of consciousness of unspecified duration, initial encounter: Secondary | ICD-10-CM | POA: Diagnosis not present

## 2022-09-07 DIAGNOSIS — S06369A Traumatic hemorrhage of cerebrum, unspecified, with loss of consciousness of unspecified duration, initial encounter: Secondary | ICD-10-CM | POA: Diagnosis not present

## 2022-09-08 ENCOUNTER — Other Ambulatory Visit: Payer: Self-pay | Admitting: Family Medicine

## 2022-09-10 DIAGNOSIS — S06369A Traumatic hemorrhage of cerebrum, unspecified, with loss of consciousness of unspecified duration, initial encounter: Secondary | ICD-10-CM | POA: Diagnosis not present

## 2022-09-14 DIAGNOSIS — S06369A Traumatic hemorrhage of cerebrum, unspecified, with loss of consciousness of unspecified duration, initial encounter: Secondary | ICD-10-CM | POA: Diagnosis not present

## 2022-09-15 DIAGNOSIS — Z9181 History of falling: Secondary | ICD-10-CM | POA: Diagnosis not present

## 2022-09-15 DIAGNOSIS — I69398 Other sequelae of cerebral infarction: Secondary | ICD-10-CM | POA: Diagnosis not present

## 2022-09-15 DIAGNOSIS — S52514D Nondisplaced fracture of right radial styloid process, subsequent encounter for closed fracture with routine healing: Secondary | ICD-10-CM | POA: Diagnosis not present

## 2022-09-15 DIAGNOSIS — M6281 Muscle weakness (generalized): Secondary | ICD-10-CM | POA: Diagnosis not present

## 2022-09-16 DIAGNOSIS — S06369A Traumatic hemorrhage of cerebrum, unspecified, with loss of consciousness of unspecified duration, initial encounter: Secondary | ICD-10-CM | POA: Diagnosis not present

## 2022-09-22 DIAGNOSIS — S06369A Traumatic hemorrhage of cerebrum, unspecified, with loss of consciousness of unspecified duration, initial encounter: Secondary | ICD-10-CM | POA: Diagnosis not present

## 2022-09-22 DIAGNOSIS — I4891 Unspecified atrial fibrillation: Secondary | ICD-10-CM | POA: Diagnosis not present

## 2022-09-22 DIAGNOSIS — R2689 Other abnormalities of gait and mobility: Secondary | ICD-10-CM | POA: Diagnosis not present

## 2022-09-22 DIAGNOSIS — E785 Hyperlipidemia, unspecified: Secondary | ICD-10-CM | POA: Diagnosis not present

## 2022-09-22 DIAGNOSIS — G3184 Mild cognitive impairment, so stated: Secondary | ICD-10-CM | POA: Diagnosis not present

## 2022-09-22 DIAGNOSIS — K5900 Constipation, unspecified: Secondary | ICD-10-CM | POA: Diagnosis not present

## 2022-09-23 DIAGNOSIS — S06369A Traumatic hemorrhage of cerebrum, unspecified, with loss of consciousness of unspecified duration, initial encounter: Secondary | ICD-10-CM | POA: Diagnosis not present

## 2022-09-28 DIAGNOSIS — S06369A Traumatic hemorrhage of cerebrum, unspecified, with loss of consciousness of unspecified duration, initial encounter: Secondary | ICD-10-CM | POA: Diagnosis not present

## 2022-09-29 DIAGNOSIS — I10 Essential (primary) hypertension: Secondary | ICD-10-CM | POA: Diagnosis not present

## 2022-09-29 DIAGNOSIS — K5900 Constipation, unspecified: Secondary | ICD-10-CM | POA: Diagnosis not present

## 2022-09-29 DIAGNOSIS — E785 Hyperlipidemia, unspecified: Secondary | ICD-10-CM | POA: Diagnosis not present

## 2022-09-29 DIAGNOSIS — I4891 Unspecified atrial fibrillation: Secondary | ICD-10-CM | POA: Diagnosis not present

## 2022-09-29 DIAGNOSIS — R2689 Other abnormalities of gait and mobility: Secondary | ICD-10-CM | POA: Diagnosis not present

## 2022-09-29 DIAGNOSIS — G3184 Mild cognitive impairment, so stated: Secondary | ICD-10-CM | POA: Diagnosis not present

## 2022-09-30 DIAGNOSIS — S06369A Traumatic hemorrhage of cerebrum, unspecified, with loss of consciousness of unspecified duration, initial encounter: Secondary | ICD-10-CM | POA: Diagnosis not present

## 2022-10-05 DIAGNOSIS — S06369A Traumatic hemorrhage of cerebrum, unspecified, with loss of consciousness of unspecified duration, initial encounter: Secondary | ICD-10-CM | POA: Diagnosis not present

## 2022-10-06 DIAGNOSIS — I10 Essential (primary) hypertension: Secondary | ICD-10-CM | POA: Diagnosis not present

## 2022-10-06 DIAGNOSIS — D473 Essential (hemorrhagic) thrombocythemia: Secondary | ICD-10-CM | POA: Diagnosis not present

## 2022-10-07 DIAGNOSIS — S06369A Traumatic hemorrhage of cerebrum, unspecified, with loss of consciousness of unspecified duration, initial encounter: Secondary | ICD-10-CM | POA: Diagnosis not present

## 2022-10-08 DIAGNOSIS — I10 Essential (primary) hypertension: Secondary | ICD-10-CM | POA: Diagnosis not present

## 2022-10-08 DIAGNOSIS — E785 Hyperlipidemia, unspecified: Secondary | ICD-10-CM | POA: Diagnosis not present

## 2022-10-08 DIAGNOSIS — K5900 Constipation, unspecified: Secondary | ICD-10-CM | POA: Diagnosis not present

## 2022-10-08 DIAGNOSIS — R2689 Other abnormalities of gait and mobility: Secondary | ICD-10-CM | POA: Diagnosis not present

## 2022-10-08 DIAGNOSIS — G3184 Mild cognitive impairment, so stated: Secondary | ICD-10-CM | POA: Diagnosis not present

## 2022-10-08 DIAGNOSIS — I4891 Unspecified atrial fibrillation: Secondary | ICD-10-CM | POA: Diagnosis not present

## 2022-10-12 DIAGNOSIS — S06369A Traumatic hemorrhage of cerebrum, unspecified, with loss of consciousness of unspecified duration, initial encounter: Secondary | ICD-10-CM | POA: Diagnosis not present

## 2022-10-14 DIAGNOSIS — S06369A Traumatic hemorrhage of cerebrum, unspecified, with loss of consciousness of unspecified duration, initial encounter: Secondary | ICD-10-CM | POA: Diagnosis not present

## 2022-10-16 DIAGNOSIS — Z9181 History of falling: Secondary | ICD-10-CM | POA: Diagnosis not present

## 2022-10-16 DIAGNOSIS — I69398 Other sequelae of cerebral infarction: Secondary | ICD-10-CM | POA: Diagnosis not present

## 2022-10-16 DIAGNOSIS — M6281 Muscle weakness (generalized): Secondary | ICD-10-CM | POA: Diagnosis not present

## 2022-10-16 DIAGNOSIS — S52514D Nondisplaced fracture of right radial styloid process, subsequent encounter for closed fracture with routine healing: Secondary | ICD-10-CM | POA: Diagnosis not present

## 2022-10-16 DIAGNOSIS — R300 Dysuria: Secondary | ICD-10-CM | POA: Diagnosis not present

## 2022-10-19 DIAGNOSIS — S06369A Traumatic hemorrhage of cerebrum, unspecified, with loss of consciousness of unspecified duration, initial encounter: Secondary | ICD-10-CM | POA: Diagnosis not present

## 2022-10-23 DIAGNOSIS — I10 Essential (primary) hypertension: Secondary | ICD-10-CM | POA: Diagnosis not present

## 2022-10-26 DIAGNOSIS — S06369A Traumatic hemorrhage of cerebrum, unspecified, with loss of consciousness of unspecified duration, initial encounter: Secondary | ICD-10-CM | POA: Diagnosis not present

## 2022-11-02 DIAGNOSIS — S06369A Traumatic hemorrhage of cerebrum, unspecified, with loss of consciousness of unspecified duration, initial encounter: Secondary | ICD-10-CM | POA: Diagnosis not present

## 2022-11-04 DIAGNOSIS — S06369A Traumatic hemorrhage of cerebrum, unspecified, with loss of consciousness of unspecified duration, initial encounter: Secondary | ICD-10-CM | POA: Diagnosis not present

## 2022-11-05 DIAGNOSIS — S06369D Traumatic hemorrhage of cerebrum, unspecified, with loss of consciousness of unspecified duration, subsequent encounter: Secondary | ICD-10-CM | POA: Diagnosis not present

## 2022-11-05 DIAGNOSIS — I482 Chronic atrial fibrillation, unspecified: Secondary | ICD-10-CM | POA: Diagnosis not present

## 2022-11-05 DIAGNOSIS — R269 Unspecified abnormalities of gait and mobility: Secondary | ICD-10-CM | POA: Diagnosis not present

## 2022-11-05 DIAGNOSIS — H47019 Ischemic optic neuropathy, unspecified eye: Secondary | ICD-10-CM | POA: Diagnosis not present

## 2022-11-05 DIAGNOSIS — K219 Gastro-esophageal reflux disease without esophagitis: Secondary | ICD-10-CM | POA: Diagnosis not present

## 2022-11-05 DIAGNOSIS — I10 Essential (primary) hypertension: Secondary | ICD-10-CM | POA: Diagnosis not present

## 2022-11-05 DIAGNOSIS — S062X0D Diffuse traumatic brain injury without loss of consciousness, subsequent encounter: Secondary | ICD-10-CM | POA: Diagnosis not present

## 2022-11-05 DIAGNOSIS — R2681 Unsteadiness on feet: Secondary | ICD-10-CM | POA: Diagnosis not present

## 2022-11-05 DIAGNOSIS — M6281 Muscle weakness (generalized): Secondary | ICD-10-CM | POA: Diagnosis not present

## 2022-11-05 DIAGNOSIS — I69398 Other sequelae of cerebral infarction: Secondary | ICD-10-CM | POA: Diagnosis not present

## 2022-11-05 DIAGNOSIS — E870 Hyperosmolality and hypernatremia: Secondary | ICD-10-CM | POA: Diagnosis not present

## 2022-11-10 DIAGNOSIS — I10 Essential (primary) hypertension: Secondary | ICD-10-CM | POA: Diagnosis not present

## 2022-11-10 DIAGNOSIS — G3184 Mild cognitive impairment, so stated: Secondary | ICD-10-CM | POA: Diagnosis not present

## 2022-11-10 DIAGNOSIS — K5900 Constipation, unspecified: Secondary | ICD-10-CM | POA: Diagnosis not present

## 2022-11-10 DIAGNOSIS — R2689 Other abnormalities of gait and mobility: Secondary | ICD-10-CM | POA: Diagnosis not present

## 2022-11-10 DIAGNOSIS — E785 Hyperlipidemia, unspecified: Secondary | ICD-10-CM | POA: Diagnosis not present

## 2022-11-10 DIAGNOSIS — I4891 Unspecified atrial fibrillation: Secondary | ICD-10-CM | POA: Diagnosis not present

## 2022-11-11 DIAGNOSIS — S06369A Traumatic hemorrhage of cerebrum, unspecified, with loss of consciousness of unspecified duration, initial encounter: Secondary | ICD-10-CM | POA: Diagnosis not present

## 2022-11-16 DIAGNOSIS — S06369A Traumatic hemorrhage of cerebrum, unspecified, with loss of consciousness of unspecified duration, initial encounter: Secondary | ICD-10-CM | POA: Diagnosis not present

## 2022-11-18 DIAGNOSIS — S06369A Traumatic hemorrhage of cerebrum, unspecified, with loss of consciousness of unspecified duration, initial encounter: Secondary | ICD-10-CM | POA: Diagnosis not present

## 2022-11-20 DIAGNOSIS — R300 Dysuria: Secondary | ICD-10-CM | POA: Diagnosis not present

## 2022-11-20 DIAGNOSIS — R350 Frequency of micturition: Secondary | ICD-10-CM | POA: Diagnosis not present

## 2022-11-23 DIAGNOSIS — S06369A Traumatic hemorrhage of cerebrum, unspecified, with loss of consciousness of unspecified duration, initial encounter: Secondary | ICD-10-CM | POA: Diagnosis not present

## 2022-11-26 DIAGNOSIS — S06369A Traumatic hemorrhage of cerebrum, unspecified, with loss of consciousness of unspecified duration, initial encounter: Secondary | ICD-10-CM | POA: Diagnosis not present

## 2022-12-01 DIAGNOSIS — S06369A Traumatic hemorrhage of cerebrum, unspecified, with loss of consciousness of unspecified duration, initial encounter: Secondary | ICD-10-CM | POA: Diagnosis not present

## 2022-12-02 DIAGNOSIS — D473 Essential (hemorrhagic) thrombocythemia: Secondary | ICD-10-CM | POA: Diagnosis not present

## 2022-12-04 ENCOUNTER — Encounter: Payer: Self-pay | Admitting: Cardiology

## 2022-12-04 NOTE — Telephone Encounter (Signed)
Kristy [name] is returning call on behalf of the patient.

## 2022-12-04 NOTE — Telephone Encounter (Signed)
Left message to call back, Hankey,David (EC)

## 2022-12-08 DIAGNOSIS — K5900 Constipation, unspecified: Secondary | ICD-10-CM | POA: Diagnosis not present

## 2022-12-08 DIAGNOSIS — G3184 Mild cognitive impairment, so stated: Secondary | ICD-10-CM | POA: Diagnosis not present

## 2022-12-08 DIAGNOSIS — E785 Hyperlipidemia, unspecified: Secondary | ICD-10-CM | POA: Diagnosis not present

## 2022-12-08 DIAGNOSIS — R2689 Other abnormalities of gait and mobility: Secondary | ICD-10-CM | POA: Diagnosis not present

## 2022-12-08 DIAGNOSIS — I4891 Unspecified atrial fibrillation: Secondary | ICD-10-CM | POA: Diagnosis not present

## 2022-12-08 DIAGNOSIS — I10 Essential (primary) hypertension: Secondary | ICD-10-CM | POA: Diagnosis not present

## 2022-12-11 DIAGNOSIS — S06369A Traumatic hemorrhage of cerebrum, unspecified, with loss of consciousness of unspecified duration, initial encounter: Secondary | ICD-10-CM | POA: Diagnosis not present

## 2022-12-18 DIAGNOSIS — S06369A Traumatic hemorrhage of cerebrum, unspecified, with loss of consciousness of unspecified duration, initial encounter: Secondary | ICD-10-CM | POA: Diagnosis not present

## 2022-12-19 DIAGNOSIS — S06369A Traumatic hemorrhage of cerebrum, unspecified, with loss of consciousness of unspecified duration, initial encounter: Secondary | ICD-10-CM | POA: Diagnosis not present

## 2022-12-23 DIAGNOSIS — S06369A Traumatic hemorrhage of cerebrum, unspecified, with loss of consciousness of unspecified duration, initial encounter: Secondary | ICD-10-CM | POA: Diagnosis not present

## 2022-12-24 DIAGNOSIS — Z9189 Other specified personal risk factors, not elsewhere classified: Secondary | ICD-10-CM | POA: Diagnosis not present

## 2022-12-24 DIAGNOSIS — I10 Essential (primary) hypertension: Secondary | ICD-10-CM | POA: Diagnosis not present

## 2022-12-24 DIAGNOSIS — Z7901 Long term (current) use of anticoagulants: Secondary | ICD-10-CM | POA: Diagnosis not present

## 2022-12-24 DIAGNOSIS — E782 Mixed hyperlipidemia: Secondary | ICD-10-CM | POA: Diagnosis not present

## 2022-12-24 DIAGNOSIS — I482 Chronic atrial fibrillation, unspecified: Secondary | ICD-10-CM | POA: Diagnosis not present

## 2022-12-29 DIAGNOSIS — S06369A Traumatic hemorrhage of cerebrum, unspecified, with loss of consciousness of unspecified duration, initial encounter: Secondary | ICD-10-CM | POA: Diagnosis not present

## 2022-12-29 DIAGNOSIS — E785 Hyperlipidemia, unspecified: Secondary | ICD-10-CM | POA: Diagnosis not present

## 2022-12-29 DIAGNOSIS — I4891 Unspecified atrial fibrillation: Secondary | ICD-10-CM | POA: Diagnosis not present

## 2022-12-29 DIAGNOSIS — R2689 Other abnormalities of gait and mobility: Secondary | ICD-10-CM | POA: Diagnosis not present

## 2022-12-29 DIAGNOSIS — G3184 Mild cognitive impairment, so stated: Secondary | ICD-10-CM | POA: Diagnosis not present

## 2022-12-29 DIAGNOSIS — I10 Essential (primary) hypertension: Secondary | ICD-10-CM | POA: Diagnosis not present

## 2022-12-29 DIAGNOSIS — K5900 Constipation, unspecified: Secondary | ICD-10-CM | POA: Diagnosis not present

## 2023-01-02 DIAGNOSIS — S06369A Traumatic hemorrhage of cerebrum, unspecified, with loss of consciousness of unspecified duration, initial encounter: Secondary | ICD-10-CM | POA: Diagnosis not present

## 2023-01-04 DIAGNOSIS — S06369A Traumatic hemorrhage of cerebrum, unspecified, with loss of consciousness of unspecified duration, initial encounter: Secondary | ICD-10-CM | POA: Diagnosis not present

## 2023-01-04 DIAGNOSIS — Z87440 Personal history of urinary (tract) infections: Secondary | ICD-10-CM | POA: Diagnosis not present

## 2023-01-04 DIAGNOSIS — N390 Urinary tract infection, site not specified: Secondary | ICD-10-CM | POA: Diagnosis not present

## 2023-01-07 DIAGNOSIS — I4891 Unspecified atrial fibrillation: Secondary | ICD-10-CM | POA: Diagnosis not present

## 2023-01-07 DIAGNOSIS — G3184 Mild cognitive impairment, so stated: Secondary | ICD-10-CM | POA: Diagnosis not present

## 2023-01-07 DIAGNOSIS — I10 Essential (primary) hypertension: Secondary | ICD-10-CM | POA: Diagnosis not present

## 2023-01-07 DIAGNOSIS — R2689 Other abnormalities of gait and mobility: Secondary | ICD-10-CM | POA: Diagnosis not present

## 2023-01-07 DIAGNOSIS — E785 Hyperlipidemia, unspecified: Secondary | ICD-10-CM | POA: Diagnosis not present

## 2023-01-07 DIAGNOSIS — K5900 Constipation, unspecified: Secondary | ICD-10-CM | POA: Diagnosis not present

## 2023-01-13 DIAGNOSIS — S06369A Traumatic hemorrhage of cerebrum, unspecified, with loss of consciousness of unspecified duration, initial encounter: Secondary | ICD-10-CM | POA: Diagnosis not present

## 2023-01-22 DIAGNOSIS — E782 Mixed hyperlipidemia: Secondary | ICD-10-CM | POA: Diagnosis not present

## 2023-01-22 DIAGNOSIS — I081 Rheumatic disorders of both mitral and tricuspid valves: Secondary | ICD-10-CM | POA: Diagnosis not present

## 2023-01-24 DIAGNOSIS — I517 Cardiomegaly: Secondary | ICD-10-CM | POA: Diagnosis not present

## 2023-01-24 DIAGNOSIS — I361 Nonrheumatic tricuspid (valve) insufficiency: Secondary | ICD-10-CM | POA: Diagnosis not present

## 2023-01-24 DIAGNOSIS — I340 Nonrheumatic mitral (valve) insufficiency: Secondary | ICD-10-CM | POA: Diagnosis not present

## 2023-01-25 DIAGNOSIS — E785 Hyperlipidemia, unspecified: Secondary | ICD-10-CM | POA: Diagnosis not present

## 2023-01-25 DIAGNOSIS — I10 Essential (primary) hypertension: Secondary | ICD-10-CM | POA: Diagnosis not present

## 2023-01-25 DIAGNOSIS — E034 Atrophy of thyroid (acquired): Secondary | ICD-10-CM | POA: Diagnosis not present

## 2023-01-26 ENCOUNTER — Other Ambulatory Visit: Payer: Self-pay | Admitting: Family Medicine

## 2023-02-04 DIAGNOSIS — E785 Hyperlipidemia, unspecified: Secondary | ICD-10-CM | POA: Diagnosis not present

## 2023-02-04 DIAGNOSIS — G3184 Mild cognitive impairment, so stated: Secondary | ICD-10-CM | POA: Diagnosis not present

## 2023-02-04 DIAGNOSIS — I10 Essential (primary) hypertension: Secondary | ICD-10-CM | POA: Diagnosis not present

## 2023-02-04 DIAGNOSIS — I4891 Unspecified atrial fibrillation: Secondary | ICD-10-CM | POA: Diagnosis not present

## 2023-02-04 DIAGNOSIS — R2689 Other abnormalities of gait and mobility: Secondary | ICD-10-CM | POA: Diagnosis not present

## 2023-02-04 DIAGNOSIS — K5900 Constipation, unspecified: Secondary | ICD-10-CM | POA: Diagnosis not present

## 2023-02-09 DIAGNOSIS — I482 Chronic atrial fibrillation, unspecified: Secondary | ICD-10-CM | POA: Diagnosis not present

## 2023-02-15 ENCOUNTER — Other Ambulatory Visit: Payer: Self-pay | Admitting: Family Medicine

## 2023-02-15 DIAGNOSIS — E782 Mixed hyperlipidemia: Secondary | ICD-10-CM | POA: Diagnosis not present

## 2023-02-15 DIAGNOSIS — I482 Chronic atrial fibrillation, unspecified: Secondary | ICD-10-CM | POA: Diagnosis not present

## 2023-02-18 DIAGNOSIS — I10 Essential (primary) hypertension: Secondary | ICD-10-CM | POA: Diagnosis not present

## 2023-02-18 DIAGNOSIS — E782 Mixed hyperlipidemia: Secondary | ICD-10-CM | POA: Diagnosis not present

## 2023-02-18 DIAGNOSIS — Z9189 Other specified personal risk factors, not elsewhere classified: Secondary | ICD-10-CM | POA: Diagnosis not present

## 2023-02-18 DIAGNOSIS — Z7901 Long term (current) use of anticoagulants: Secondary | ICD-10-CM | POA: Diagnosis not present

## 2023-02-18 DIAGNOSIS — Z713 Dietary counseling and surveillance: Secondary | ICD-10-CM | POA: Diagnosis not present

## 2023-02-18 DIAGNOSIS — I482 Chronic atrial fibrillation, unspecified: Secondary | ICD-10-CM | POA: Diagnosis not present

## 2023-03-03 DIAGNOSIS — D473 Essential (hemorrhagic) thrombocythemia: Secondary | ICD-10-CM | POA: Diagnosis not present

## 2023-03-03 DIAGNOSIS — D500 Iron deficiency anemia secondary to blood loss (chronic): Secondary | ICD-10-CM | POA: Diagnosis not present

## 2023-03-03 DIAGNOSIS — I10 Essential (primary) hypertension: Secondary | ICD-10-CM | POA: Diagnosis not present

## 2023-03-09 DIAGNOSIS — E785 Hyperlipidemia, unspecified: Secondary | ICD-10-CM | POA: Diagnosis not present

## 2023-03-09 DIAGNOSIS — R2689 Other abnormalities of gait and mobility: Secondary | ICD-10-CM | POA: Diagnosis not present

## 2023-03-09 DIAGNOSIS — I4891 Unspecified atrial fibrillation: Secondary | ICD-10-CM | POA: Diagnosis not present

## 2023-03-09 DIAGNOSIS — I10 Essential (primary) hypertension: Secondary | ICD-10-CM | POA: Diagnosis not present

## 2023-03-09 DIAGNOSIS — K5900 Constipation, unspecified: Secondary | ICD-10-CM | POA: Diagnosis not present

## 2023-03-09 DIAGNOSIS — G3184 Mild cognitive impairment, so stated: Secondary | ICD-10-CM | POA: Diagnosis not present

## 2023-04-06 DIAGNOSIS — I4891 Unspecified atrial fibrillation: Secondary | ICD-10-CM | POA: Diagnosis not present

## 2023-04-06 DIAGNOSIS — E785 Hyperlipidemia, unspecified: Secondary | ICD-10-CM | POA: Diagnosis not present

## 2023-04-06 DIAGNOSIS — K5900 Constipation, unspecified: Secondary | ICD-10-CM | POA: Diagnosis not present

## 2023-04-06 DIAGNOSIS — I10 Essential (primary) hypertension: Secondary | ICD-10-CM | POA: Diagnosis not present

## 2023-04-06 DIAGNOSIS — G3184 Mild cognitive impairment, so stated: Secondary | ICD-10-CM | POA: Diagnosis not present

## 2023-04-06 DIAGNOSIS — R2689 Other abnormalities of gait and mobility: Secondary | ICD-10-CM | POA: Diagnosis not present

## 2023-05-06 DIAGNOSIS — K5900 Constipation, unspecified: Secondary | ICD-10-CM | POA: Diagnosis not present

## 2023-05-06 DIAGNOSIS — I4891 Unspecified atrial fibrillation: Secondary | ICD-10-CM | POA: Diagnosis not present

## 2023-05-06 DIAGNOSIS — E785 Hyperlipidemia, unspecified: Secondary | ICD-10-CM | POA: Diagnosis not present

## 2023-05-06 DIAGNOSIS — I10 Essential (primary) hypertension: Secondary | ICD-10-CM | POA: Diagnosis not present

## 2023-05-06 DIAGNOSIS — G3184 Mild cognitive impairment, so stated: Secondary | ICD-10-CM | POA: Diagnosis not present

## 2023-05-06 DIAGNOSIS — R2689 Other abnormalities of gait and mobility: Secondary | ICD-10-CM | POA: Diagnosis not present

## 2023-05-17 DIAGNOSIS — R300 Dysuria: Secondary | ICD-10-CM | POA: Diagnosis not present

## 2023-05-17 DIAGNOSIS — R350 Frequency of micturition: Secondary | ICD-10-CM | POA: Diagnosis not present

## 2023-05-21 DIAGNOSIS — R300 Dysuria: Secondary | ICD-10-CM | POA: Diagnosis not present

## 2023-05-21 DIAGNOSIS — R350 Frequency of micturition: Secondary | ICD-10-CM | POA: Diagnosis not present

## 2023-06-04 DIAGNOSIS — N390 Urinary tract infection, site not specified: Secondary | ICD-10-CM | POA: Diagnosis not present

## 2023-06-06 ENCOUNTER — Other Ambulatory Visit: Payer: Self-pay | Admitting: Family Medicine

## 2023-06-29 DIAGNOSIS — I4891 Unspecified atrial fibrillation: Secondary | ICD-10-CM | POA: Diagnosis not present

## 2023-06-29 DIAGNOSIS — I10 Essential (primary) hypertension: Secondary | ICD-10-CM | POA: Diagnosis not present

## 2023-06-29 DIAGNOSIS — K5900 Constipation, unspecified: Secondary | ICD-10-CM | POA: Diagnosis not present

## 2023-06-29 DIAGNOSIS — E785 Hyperlipidemia, unspecified: Secondary | ICD-10-CM | POA: Diagnosis not present

## 2023-06-29 DIAGNOSIS — R2689 Other abnormalities of gait and mobility: Secondary | ICD-10-CM | POA: Diagnosis not present

## 2023-06-29 DIAGNOSIS — G3184 Mild cognitive impairment, so stated: Secondary | ICD-10-CM | POA: Diagnosis not present

## 2023-07-05 DIAGNOSIS — R413 Other amnesia: Secondary | ICD-10-CM | POA: Diagnosis not present

## 2023-07-08 DIAGNOSIS — N390 Urinary tract infection, site not specified: Secondary | ICD-10-CM | POA: Diagnosis not present

## 2023-07-19 DIAGNOSIS — D473 Essential (hemorrhagic) thrombocythemia: Secondary | ICD-10-CM | POA: Diagnosis not present

## 2023-07-22 DIAGNOSIS — R300 Dysuria: Secondary | ICD-10-CM | POA: Diagnosis not present

## 2023-07-22 DIAGNOSIS — R350 Frequency of micturition: Secondary | ICD-10-CM | POA: Diagnosis not present

## 2023-08-05 DIAGNOSIS — N390 Urinary tract infection, site not specified: Secondary | ICD-10-CM | POA: Diagnosis not present

## 2023-08-23 DIAGNOSIS — Z9189 Other specified personal risk factors, not elsewhere classified: Secondary | ICD-10-CM | POA: Diagnosis not present

## 2023-08-23 DIAGNOSIS — I10 Essential (primary) hypertension: Secondary | ICD-10-CM | POA: Diagnosis not present

## 2023-08-23 DIAGNOSIS — E782 Mixed hyperlipidemia: Secondary | ICD-10-CM | POA: Diagnosis not present

## 2023-08-23 DIAGNOSIS — S065XAA Traumatic subdural hemorrhage with loss of consciousness status unknown, initial encounter: Secondary | ICD-10-CM | POA: Diagnosis not present

## 2023-08-23 DIAGNOSIS — Z713 Dietary counseling and surveillance: Secondary | ICD-10-CM | POA: Diagnosis not present

## 2023-08-23 DIAGNOSIS — Z7901 Long term (current) use of anticoagulants: Secondary | ICD-10-CM | POA: Diagnosis not present

## 2023-08-23 DIAGNOSIS — I482 Chronic atrial fibrillation, unspecified: Secondary | ICD-10-CM | POA: Diagnosis not present

## 2023-09-16 DIAGNOSIS — I10 Essential (primary) hypertension: Secondary | ICD-10-CM | POA: Diagnosis not present

## 2023-09-16 DIAGNOSIS — G3184 Mild cognitive impairment, so stated: Secondary | ICD-10-CM | POA: Diagnosis not present

## 2023-09-16 DIAGNOSIS — I4891 Unspecified atrial fibrillation: Secondary | ICD-10-CM | POA: Diagnosis not present

## 2023-09-16 DIAGNOSIS — E785 Hyperlipidemia, unspecified: Secondary | ICD-10-CM | POA: Diagnosis not present

## 2023-09-16 DIAGNOSIS — R2689 Other abnormalities of gait and mobility: Secondary | ICD-10-CM | POA: Diagnosis not present

## 2023-09-16 DIAGNOSIS — K5900 Constipation, unspecified: Secondary | ICD-10-CM | POA: Diagnosis not present

## 2023-10-07 DIAGNOSIS — N390 Urinary tract infection, site not specified: Secondary | ICD-10-CM | POA: Diagnosis not present

## 2023-10-11 DIAGNOSIS — I10 Essential (primary) hypertension: Secondary | ICD-10-CM | POA: Diagnosis not present

## 2023-10-11 DIAGNOSIS — E785 Hyperlipidemia, unspecified: Secondary | ICD-10-CM | POA: Diagnosis not present

## 2023-10-12 DIAGNOSIS — E785 Hyperlipidemia, unspecified: Secondary | ICD-10-CM | POA: Diagnosis not present

## 2023-10-12 DIAGNOSIS — I10 Essential (primary) hypertension: Secondary | ICD-10-CM | POA: Diagnosis not present

## 2023-10-12 DIAGNOSIS — G3184 Mild cognitive impairment, so stated: Secondary | ICD-10-CM | POA: Diagnosis not present

## 2023-10-12 DIAGNOSIS — R2689 Other abnormalities of gait and mobility: Secondary | ICD-10-CM | POA: Diagnosis not present

## 2023-10-12 DIAGNOSIS — N390 Urinary tract infection, site not specified: Secondary | ICD-10-CM | POA: Diagnosis not present

## 2023-10-12 DIAGNOSIS — I4891 Unspecified atrial fibrillation: Secondary | ICD-10-CM | POA: Diagnosis not present

## 2023-10-12 DIAGNOSIS — K5900 Constipation, unspecified: Secondary | ICD-10-CM | POA: Diagnosis not present

## 2023-11-09 DIAGNOSIS — E785 Hyperlipidemia, unspecified: Secondary | ICD-10-CM | POA: Diagnosis not present

## 2023-11-09 DIAGNOSIS — G3184 Mild cognitive impairment, so stated: Secondary | ICD-10-CM | POA: Diagnosis not present

## 2023-11-09 DIAGNOSIS — D473 Essential (hemorrhagic) thrombocythemia: Secondary | ICD-10-CM | POA: Diagnosis not present

## 2023-11-09 DIAGNOSIS — K5900 Constipation, unspecified: Secondary | ICD-10-CM | POA: Diagnosis not present

## 2023-11-09 DIAGNOSIS — I4891 Unspecified atrial fibrillation: Secondary | ICD-10-CM | POA: Diagnosis not present

## 2023-11-09 DIAGNOSIS — I10 Essential (primary) hypertension: Secondary | ICD-10-CM | POA: Diagnosis not present

## 2023-11-09 DIAGNOSIS — N390 Urinary tract infection, site not specified: Secondary | ICD-10-CM | POA: Diagnosis not present

## 2023-11-09 DIAGNOSIS — R2689 Other abnormalities of gait and mobility: Secondary | ICD-10-CM | POA: Diagnosis not present

## 2023-12-14 DIAGNOSIS — I10 Essential (primary) hypertension: Secondary | ICD-10-CM | POA: Diagnosis not present

## 2023-12-14 DIAGNOSIS — R2689 Other abnormalities of gait and mobility: Secondary | ICD-10-CM | POA: Diagnosis not present

## 2023-12-14 DIAGNOSIS — E785 Hyperlipidemia, unspecified: Secondary | ICD-10-CM | POA: Diagnosis not present

## 2023-12-14 DIAGNOSIS — K5900 Constipation, unspecified: Secondary | ICD-10-CM | POA: Diagnosis not present

## 2023-12-14 DIAGNOSIS — I4891 Unspecified atrial fibrillation: Secondary | ICD-10-CM | POA: Diagnosis not present

## 2023-12-14 DIAGNOSIS — G3184 Mild cognitive impairment, so stated: Secondary | ICD-10-CM | POA: Diagnosis not present

## 2023-12-14 DIAGNOSIS — N390 Urinary tract infection, site not specified: Secondary | ICD-10-CM | POA: Diagnosis not present

## 2024-01-11 DIAGNOSIS — I4891 Unspecified atrial fibrillation: Secondary | ICD-10-CM | POA: Diagnosis not present

## 2024-01-11 DIAGNOSIS — I10 Essential (primary) hypertension: Secondary | ICD-10-CM | POA: Diagnosis not present

## 2024-01-11 DIAGNOSIS — R2689 Other abnormalities of gait and mobility: Secondary | ICD-10-CM | POA: Diagnosis not present

## 2024-01-11 DIAGNOSIS — K5900 Constipation, unspecified: Secondary | ICD-10-CM | POA: Diagnosis not present

## 2024-01-11 DIAGNOSIS — G3184 Mild cognitive impairment, so stated: Secondary | ICD-10-CM | POA: Diagnosis not present

## 2024-01-11 DIAGNOSIS — E785 Hyperlipidemia, unspecified: Secondary | ICD-10-CM | POA: Diagnosis not present

## 2024-02-02 DIAGNOSIS — E782 Mixed hyperlipidemia: Secondary | ICD-10-CM | POA: Diagnosis not present

## 2024-02-02 DIAGNOSIS — I10 Essential (primary) hypertension: Secondary | ICD-10-CM | POA: Diagnosis not present

## 2024-02-08 DIAGNOSIS — G3184 Mild cognitive impairment, so stated: Secondary | ICD-10-CM | POA: Diagnosis not present

## 2024-02-08 DIAGNOSIS — I4891 Unspecified atrial fibrillation: Secondary | ICD-10-CM | POA: Diagnosis not present

## 2024-02-08 DIAGNOSIS — I10 Essential (primary) hypertension: Secondary | ICD-10-CM | POA: Diagnosis not present

## 2024-02-08 DIAGNOSIS — K5900 Constipation, unspecified: Secondary | ICD-10-CM | POA: Diagnosis not present

## 2024-02-08 DIAGNOSIS — R2689 Other abnormalities of gait and mobility: Secondary | ICD-10-CM | POA: Diagnosis not present

## 2024-02-08 DIAGNOSIS — E785 Hyperlipidemia, unspecified: Secondary | ICD-10-CM | POA: Diagnosis not present

## 2024-02-22 DIAGNOSIS — N390 Urinary tract infection, site not specified: Secondary | ICD-10-CM | POA: Diagnosis not present

## 2024-02-24 DIAGNOSIS — E782 Mixed hyperlipidemia: Secondary | ICD-10-CM | POA: Diagnosis not present

## 2024-02-24 DIAGNOSIS — I10 Essential (primary) hypertension: Secondary | ICD-10-CM | POA: Diagnosis not present

## 2024-02-24 DIAGNOSIS — Z9189 Other specified personal risk factors, not elsewhere classified: Secondary | ICD-10-CM | POA: Diagnosis not present

## 2024-02-24 DIAGNOSIS — Z7901 Long term (current) use of anticoagulants: Secondary | ICD-10-CM | POA: Diagnosis not present

## 2024-02-24 DIAGNOSIS — I482 Chronic atrial fibrillation, unspecified: Secondary | ICD-10-CM | POA: Diagnosis not present

## 2024-02-24 DIAGNOSIS — S065XAA Traumatic subdural hemorrhage with loss of consciousness status unknown, initial encounter: Secondary | ICD-10-CM | POA: Diagnosis not present

## 2024-03-07 DIAGNOSIS — G3184 Mild cognitive impairment, so stated: Secondary | ICD-10-CM | POA: Diagnosis not present

## 2024-03-07 DIAGNOSIS — R2689 Other abnormalities of gait and mobility: Secondary | ICD-10-CM | POA: Diagnosis not present

## 2024-03-07 DIAGNOSIS — I10 Essential (primary) hypertension: Secondary | ICD-10-CM | POA: Diagnosis not present

## 2024-03-07 DIAGNOSIS — E785 Hyperlipidemia, unspecified: Secondary | ICD-10-CM | POA: Diagnosis not present

## 2024-03-07 DIAGNOSIS — I4891 Unspecified atrial fibrillation: Secondary | ICD-10-CM | POA: Diagnosis not present

## 2024-03-07 DIAGNOSIS — K5900 Constipation, unspecified: Secondary | ICD-10-CM | POA: Diagnosis not present

## 2024-04-06 DIAGNOSIS — E785 Hyperlipidemia, unspecified: Secondary | ICD-10-CM | POA: Diagnosis not present

## 2024-04-06 DIAGNOSIS — G3184 Mild cognitive impairment, so stated: Secondary | ICD-10-CM | POA: Diagnosis not present

## 2024-04-06 DIAGNOSIS — I10 Essential (primary) hypertension: Secondary | ICD-10-CM | POA: Diagnosis not present

## 2024-04-06 DIAGNOSIS — K5900 Constipation, unspecified: Secondary | ICD-10-CM | POA: Diagnosis not present

## 2024-04-06 DIAGNOSIS — R2689 Other abnormalities of gait and mobility: Secondary | ICD-10-CM | POA: Diagnosis not present

## 2024-04-06 DIAGNOSIS — I4891 Unspecified atrial fibrillation: Secondary | ICD-10-CM | POA: Diagnosis not present

## 2024-05-16 DIAGNOSIS — G309 Alzheimer's disease, unspecified: Secondary | ICD-10-CM | POA: Diagnosis not present

## 2024-05-16 DIAGNOSIS — R413 Other amnesia: Secondary | ICD-10-CM | POA: Diagnosis not present

## 2024-05-22 DIAGNOSIS — N390 Urinary tract infection, site not specified: Secondary | ICD-10-CM | POA: Diagnosis not present

## 2024-06-15 DIAGNOSIS — R413 Other amnesia: Secondary | ICD-10-CM | POA: Diagnosis not present

## 2024-06-15 DIAGNOSIS — G309 Alzheimer's disease, unspecified: Secondary | ICD-10-CM | POA: Diagnosis not present

## 2024-06-16 DIAGNOSIS — I10 Essential (primary) hypertension: Secondary | ICD-10-CM | POA: Diagnosis not present

## 2024-06-16 DIAGNOSIS — E785 Hyperlipidemia, unspecified: Secondary | ICD-10-CM | POA: Diagnosis not present

## 2024-07-04 DIAGNOSIS — B351 Tinea unguium: Secondary | ICD-10-CM | POA: Diagnosis not present

## 2024-07-04 DIAGNOSIS — I739 Peripheral vascular disease, unspecified: Secondary | ICD-10-CM | POA: Diagnosis not present

## 2024-07-04 DIAGNOSIS — R601 Generalized edema: Secondary | ICD-10-CM | POA: Diagnosis not present

## 2024-07-04 DIAGNOSIS — L600 Ingrowing nail: Secondary | ICD-10-CM | POA: Diagnosis not present

## 2024-07-13 DIAGNOSIS — G309 Alzheimer's disease, unspecified: Secondary | ICD-10-CM | POA: Diagnosis not present

## 2024-07-13 DIAGNOSIS — R413 Other amnesia: Secondary | ICD-10-CM | POA: Diagnosis not present

## 2024-08-28 DIAGNOSIS — N390 Urinary tract infection, site not specified: Secondary | ICD-10-CM | POA: Diagnosis not present

## 2024-09-03 DIAGNOSIS — R079 Chest pain, unspecified: Secondary | ICD-10-CM | POA: Diagnosis not present

## 2024-11-06 DEATH — deceased
